# Patient Record
Sex: Female | Born: 1950 | Race: Black or African American | Hispanic: No | Smoking: Current some day smoker
Health system: Southern US, Community
[De-identification: ages and names within clinical notes are randomized; demographics above are authoritative.]

## PROBLEM LIST (undated history)

## (undated) DIAGNOSIS — R569 Unspecified convulsions: Secondary | ICD-10-CM

## (undated) DIAGNOSIS — I10 Essential (primary) hypertension: Secondary | ICD-10-CM

## (undated) DIAGNOSIS — B1920 Unspecified viral hepatitis C without hepatic coma: Secondary | ICD-10-CM

## (undated) DIAGNOSIS — E7800 Pure hypercholesterolemia, unspecified: Secondary | ICD-10-CM

## (undated) DIAGNOSIS — N390 Urinary tract infection, site not specified: Secondary | ICD-10-CM

## (undated) DIAGNOSIS — F32A Depression, unspecified: Secondary | ICD-10-CM

## (undated) DIAGNOSIS — F319 Bipolar disorder, unspecified: Secondary | ICD-10-CM

## (undated) HISTORY — DX: Pure hypercholesterolemia, unspecified: E78.00

## (undated) HISTORY — PX: TOE AMPUTATION: SHX809

## (undated) HISTORY — DX: Urinary tract infection, site not specified: N39.0

## (undated) HISTORY — PX: APPENDECTOMY: SHX54

## (undated) HISTORY — DX: Depression, unspecified: F32.A

---

## 2019-12-13 ENCOUNTER — Other Ambulatory Visit: Payer: Self-pay

## 2019-12-13 ENCOUNTER — Emergency Department (HOSPITAL_COMMUNITY)
Admission: EM | Admit: 2019-12-13 | Discharge: 2019-12-13 | Disposition: A | Discharge status: Home / Self Care | Payer: Medicare (Managed Care) | Attending: Emergency Medicine | Admitting: Emergency Medicine

## 2019-12-13 ENCOUNTER — Encounter (HOSPITAL_COMMUNITY): Payer: Self-pay | Admitting: Emergency Medicine

## 2019-12-13 DIAGNOSIS — R4182 Altered mental status, unspecified: Secondary | ICD-10-CM | POA: Diagnosis present

## 2019-12-13 DIAGNOSIS — F319 Bipolar disorder, unspecified: Secondary | ICD-10-CM | POA: Insufficient documentation

## 2019-12-13 DIAGNOSIS — Z79899 Other long term (current) drug therapy: Secondary | ICD-10-CM | POA: Diagnosis not present

## 2019-12-13 HISTORY — DX: Bipolar disorder, unspecified: F31.9

## 2019-12-13 LAB — COMPREHENSIVE METABOLIC PANEL
ALT: 24 U/L (ref 0–44)
AST: 22 U/L (ref 15–41)
Albumin: 4.7 g/dL (ref 3.5–5.0)
Alkaline Phosphatase: 51 U/L (ref 38–126)
Anion gap: 13 (ref 5–15)
BUN: 17 mg/dL (ref 8–23)
CO2: 23 mmol/L (ref 22–32)
Calcium: 10.2 mg/dL (ref 8.9–10.3)
Chloride: 100 mmol/L (ref 98–111)
Creatinine, Ser: 1.31 mg/dL — ABNORMAL HIGH (ref 0.44–1.00)
GFR calc Af Amer: 48 mL/min — ABNORMAL LOW (ref >60)
GFR calc non Af Amer: 42 mL/min — ABNORMAL LOW (ref >60)
Glucose, Bld: 190 mg/dL — ABNORMAL HIGH (ref 70–99)
Potassium: 3.7 mmol/L (ref 3.5–5.1)
Sodium: 136 mmol/L (ref 135–145)
Total Bilirubin: 0.6 mg/dL (ref 0.3–1.2)
Total Protein: 8 g/dL (ref 6.5–8.1)

## 2019-12-13 LAB — CBC
HCT: 42.8 % (ref 36.0–46.0)
Hemoglobin: 14.1 g/dL (ref 12.0–15.0)
MCH: 29.7 pg (ref 26.0–34.0)
MCHC: 32.9 g/dL (ref 30.0–36.0)
MCV: 90.3 fL (ref 80.0–100.0)
Platelets: 302 10*3/uL (ref 150–400)
RBC: 4.74 MIL/uL (ref 3.87–5.11)
RDW: 14.7 % (ref 11.5–15.5)
WBC: 9.9 10*3/uL (ref 4.0–10.5)
nRBC: 0 % (ref 0.0–0.2)

## 2019-12-13 LAB — CBG MONITORING, ED: Glucose-Capillary: 134 mg/dL — ABNORMAL HIGH (ref 70–99)

## 2019-12-13 MED ORDER — SODIUM CHLORIDE 0.9% FLUSH: 3.0000 mL | Freq: Once | INTRAVENOUS | Status: DC

## 2019-12-13 NOTE — Discharge Planning (Signed)
RNCM left voice message at [phone] and relayed that pt is ready to be picked up from hospital ED.

## 2019-12-13 NOTE — Discharge Instructions (Addendum)
It is important to establish psychiatric care, to manage her bipolar disorder.  You can be seen at [facility] by schedule an appointment, or go there, to be assessed anytime.  Return here, if needed, for problems.

## 2019-12-13 NOTE — Progress Notes (Addendum)
1:15pm: CSW spoke with Rolene Arbour, RN who states she discharged the patient home via cab. RN states she spoke with the patient's daughter after [employer] attempted the welfare check who was stating the patient should not be discharged regardless of medical clearance.  12:50pm: CSW received return call from Engineer, maintenance ([employer]) of [employer] who states the wellness check was unsuccessful. Deputy reports there are three cars in the driveway with the windows down but no answer from inside the home.  CSW attempted to reach Linli Su at [phone] without success, a voicemail was left requesting a return call. CSW sent text message requesting a return call as well.  12:20pm: CSW contacted non-emergent [employer] to perform a wellness check at the patient's address in attempts to find the family member who called EMS for her this morning.  [employer] will return call to CSW once check is completed.  Madilyn Fireman, MSW, LCSW-A Transitions of Care  Clinical Social Worker  [facility] Emergency Departments  Medical ICU [phone]

## 2019-12-13 NOTE — ED Notes (Signed)
Phone number that 911 call came from per [facility] communications is  [phone].

## 2019-12-13 NOTE — ED Provider Notes (Signed)
[facility] EMERGENCY DEPARTMENT Provider Note   CSN: [id number] Arrival date & time: 12/13/19  0444     History Chief Complaint  Patient presents with  . Altered Mental Status    Shelby Jordan is a 69 y.o. female.  HPI Patient presents after an episode this morning when she would not talk to family members.  She states she is taking medicines prescribed by her doctor in [place].  She is conversant, and responsive to questions.  She denies any acute medical or psychiatric issues.  She understands that she is bipolar, and that she needs to take medicine for it.  She states she is new to [place], after moving here, 6 months ago.  She denies any acute illnesses.  She states that she wants to take one of the Covid vaccines, and is trying to decide between [employer] and [employer]. She states that she has previously been treated with lithium but now is on a different antipsychotic medication that she cannot recall. There are no other known modifying factors.    Past Medical History:  Diagnosis Date  . Bipolar 1 disorder ([place])     There are no problems to display for this patient.   History reviewed. No pertinent surgical history.   OB History   No obstetric history on file.     No family history on file.  Social History   Tobacco Use  . Smoking status: Never Smoker  . Smokeless tobacco: Never Used  Substance Use Topics  . Alcohol use: Never  . Drug use: Never    Home Medications Prior to Admission medications   Not on File    Allergies    Patient has no known allergies.  Review of Systems   Review of Systems  All other systems reviewed and are negative.   Physical Exam Updated Vital Signs BP (!) 187/89   Pulse 94   Temp 98.2 F (36.8 C) (Oral)   Resp 18   SpO2 99%   Physical Exam Vitals and nursing note reviewed.  Constitutional:      Appearance: She is well-developed.  HENT:     Head: Normocephalic and atraumatic.      Right Ear: External ear normal.     Left Ear: External ear normal.  Eyes:     Conjunctiva/sclera: Conjunctivae normal.     Pupils: Pupils are equal, round, and reactive to light.  Neck:     Trachea: Phonation normal.  Cardiovascular:     Rate and Rhythm: Normal rate.  Pulmonary:     Effort: Pulmonary effort is normal.  Abdominal:     General: There is no distension.  Musculoskeletal:        General: Normal range of motion.     Cervical back: Normal range of motion and neck supple.  Skin:    General: Skin is warm and dry.  Neurological:     Mental Status: She is alert and oriented to person, place, and time.     Cranial Nerves: No cranial nerve deficit.     Sensory: No sensory deficit.     Motor: No abnormal muscle tone.     Coordination: Coordination normal.  Psychiatric:        Attention and Perception: She is attentive. She does not perceive auditory or visual hallucinations.        Mood and Affect: Mood is not anxious, depressed or elated. Affect is not labile, angry or inappropriate.  Speech: She is communicative. Speech is not rapid and pressured, delayed or tangential.        Behavior: Behavior normal. Behavior is not agitated, withdrawn or hyperactive. Behavior is cooperative.        Thought Content: Thought content is not paranoid or delusional.        Cognition and Memory: Memory is not impaired.        Judgment: Judgment is not impulsive or inappropriate.     ED Results / Procedures / Treatments   Labs (all labs ordered are listed, but only abnormal results are displayed) Labs Reviewed  COMPREHENSIVE METABOLIC PANEL - Abnormal; Notable for the following components:      Result Value   Glucose, Bld 190 (*)    Creatinine, Ser 1.31 (*)    GFR calc non Af Amer 42 (*)    GFR calc Af Amer 48 (*)    All other components within normal limits  CBG MONITORING, ED - Abnormal; Notable for the following components:   Glucose-Capillary 134 (*)    All other components  within normal limits  CBC    EKG EKG Interpretation  Date/Time:  Wednesday Dec 13 2019 04:53:52 EDT Ventricular Rate:  100 PR Interval:  170 QRS Duration: 148 QT Interval:  380 QTC Calculation: 490 R Axis:   -15 Text Interpretation: Sinus rhythm with Premature atrial complexes Right bundle branch block Abnormal ECG No old tracing to compare Confirmed by Delora Fuel ([id number]) on 12/13/2019 5:05:17 AM   Radiology No results found.  Procedures Procedures (including critical care time)  Medications Ordered in ED Medications  sodium chloride flush (NS) 0.9 % injection 3 mL (has no administration in time range)    ED Course  I have reviewed the triage vital signs and the nursing notes.  Pertinent labs & imaging results that were available during my care of the patient were reviewed by me and considered in my medical decision making (see chart for details).    MDM Rules/Calculators/A&P                       Patient Vitals for the past 24 hrs:  BP Temp Temp src Pulse Resp SpO2  12/13/19 0811 (!) 187/89 98.2 F (36.8 C) Oral 94 18 99 %    Nursing Notes Reviewed/ Care Coordinated Applicable Imaging Reviewed Interpretation of Laboratory Data incorporated into ED treatment    Medical Decision Making:  This patient is presenting for evaluation of a period of altered responsiveness while at home., which does require a range of treatment options, and is a complaint that involves a low risk of morbidity and mortality. The differential diagnoses include unstable psychiatric condition, depression, acute medical illness. I decided to review old records, and in summary elderly patient with history of bipolar disorder.  Patient recently moved to [facility] and does not have established primary care or psychiatric care at this time.      Critical Interventions-clinical evaluation, observation, reassessment  After These Interventions, the Patient was reevaluated and was found to  have clinical evidence for bipolar disorder, which appears to be stable at this time. She is cooperative and fairly insightful. She apparently does not have established care locally.  CRITICAL CARE-no Performed by: Daleen Bo  Nursing Notes Reviewed/ Care Coordinated Applicable Imaging Reviewed Interpretation of Laboratory Data incorporated into ED treatment  The patient appears reasonably screened and/or stabilized for discharge and I doubt any other medical condition or other [facility] requiring further screening,  evaluation, or treatment in the ED at this time prior to discharge.  Plan: Home Medications-usual; Home Treatments-rest, fluids; return here if the recommended treatment, does not improve the symptoms; Recommended follow up-follow-up with PCP and psychiatry as needed for problems.     Final Clinical Impression(s) / ED Diagnoses Final diagnoses:  Bipolar affective disorder, remission status unspecified ([place])    Rx / DC Orders ED Discharge Orders    None       Daleen Bo, MD 12/15/19 [phone]

## 2019-12-13 NOTE — ED Notes (Signed)
Lunch Tray Ordered @ 1130.

## 2019-12-13 NOTE — ED Notes (Signed)
Pt is talking to self, cooperative, knows daughtyer's and granddaughter's name, but not phone numbers.

## 2019-12-13 NOTE — ED Triage Notes (Signed)
Pt brought to Ed by [facility] from family home, per EMS pt just move here from [place], Hx of Bipolar. Per family she was just sitting on the dark and when they called her name she started screaming and fell on the floor. On EMS arrival pt was AO x 4. Per family pt use to have some seizure activity when she get Manic. BP 182/100, HR 110, CBG 247, SPO2 100% RA. Family unable to state LSW for this pt. Pt is AO x 4 on triage, no neuro deficit noticed.

## 2019-12-13 NOTE — ED Notes (Signed)
[place] notified in attempt to find family phone number. Pt does not know number to contact daughter for discharge, is visiting here from [place].

## 2019-12-14 ENCOUNTER — Other Ambulatory Visit: Payer: Self-pay

## 2019-12-14 ENCOUNTER — Inpatient Hospital Stay
Admission: AD | Admit: 2019-12-14 | Discharge: 2019-12-27 | DRG: 885 | Disposition: A | Discharge status: Home / Self Care | Payer: Medicare (Managed Care) | Source: Intra-hospital | Attending: Psychiatry | Admitting: Psychiatry

## 2019-12-14 ENCOUNTER — Emergency Department: Payer: Medicare (Managed Care)

## 2019-12-14 ENCOUNTER — Encounter: Payer: Self-pay | Admitting: Emergency Medicine

## 2019-12-14 ENCOUNTER — Emergency Department
Admission: EM | Admit: 2019-12-14 | Discharge: 2019-12-14 | Disposition: A | Discharge status: Other Acute Inpatient Hospital | Payer: Medicare (Managed Care) | Attending: Emergency Medicine | Admitting: Emergency Medicine

## 2019-12-14 DIAGNOSIS — F411 Generalized anxiety disorder: Secondary | ICD-10-CM | POA: Diagnosis not present

## 2019-12-14 DIAGNOSIS — F3164 Bipolar disorder, current episode mixed, severe, with psychotic features: Secondary | ICD-10-CM | POA: Diagnosis not present

## 2019-12-14 DIAGNOSIS — F3110 Bipolar disorder, current episode manic without psychotic features, unspecified: Secondary | ICD-10-CM

## 2019-12-14 DIAGNOSIS — R05 Cough: Secondary | ICD-10-CM | POA: Diagnosis not present

## 2019-12-14 DIAGNOSIS — F309 Manic episode, unspecified: Secondary | ICD-10-CM

## 2019-12-14 DIAGNOSIS — N390 Urinary tract infection, site not specified: Secondary | ICD-10-CM | POA: Diagnosis present

## 2019-12-14 DIAGNOSIS — B1920 Unspecified viral hepatitis C without hepatic coma: Secondary | ICD-10-CM | POA: Diagnosis present

## 2019-12-14 DIAGNOSIS — I10 Essential (primary) hypertension: Secondary | ICD-10-CM | POA: Diagnosis present

## 2019-12-14 DIAGNOSIS — E119 Type 2 diabetes mellitus without complications: Secondary | ICD-10-CM | POA: Diagnosis present

## 2019-12-14 DIAGNOSIS — F909 Attention-deficit hyperactivity disorder, unspecified type: Secondary | ICD-10-CM | POA: Diagnosis present

## 2019-12-14 DIAGNOSIS — Z20822 Contact with and (suspected) exposure to covid-19: Secondary | ICD-10-CM | POA: Insufficient documentation

## 2019-12-14 DIAGNOSIS — G4700 Insomnia, unspecified: Secondary | ICD-10-CM | POA: Diagnosis present

## 2019-12-14 DIAGNOSIS — F319 Bipolar disorder, unspecified: Secondary | ICD-10-CM

## 2019-12-14 DIAGNOSIS — R4182 Altered mental status, unspecified: Secondary | ICD-10-CM | POA: Insufficient documentation

## 2019-12-14 DIAGNOSIS — F3130 Bipolar disorder, current episode depressed, mild or moderate severity, unspecified: Secondary | ICD-10-CM | POA: Diagnosis present

## 2019-12-14 DIAGNOSIS — I679 Cerebrovascular disease, unspecified: Secondary | ICD-10-CM | POA: Diagnosis present

## 2019-12-14 HISTORY — DX: Essential (primary) hypertension: I10

## 2019-12-14 HISTORY — DX: Unspecified viral hepatitis C without hepatic coma: B19.20

## 2019-12-14 LAB — URINALYSIS, COMPLETE (UACMP) WITH MICROSCOPIC
Bilirubin Urine: NEGATIVE
Glucose, UA: NEGATIVE mg/dL
Hgb urine dipstick: NEGATIVE
Ketones, ur: NEGATIVE mg/dL
Nitrite: NEGATIVE
Protein, ur: 100 mg/dL — AB
Specific Gravity, Urine: 1.023 (ref 1.005–1.030)
Squamous Epithelial / HPF: >50 — ABNORMAL HIGH (ref 0–5)
WBC, UA: >50 WBC/hpf — ABNORMAL HIGH (ref 0–5)
pH: 5 (ref 5.0–8.0)

## 2019-12-14 LAB — URINE DRUG SCREEN, QUALITATIVE (ARMC ONLY)
Amphetamines, Ur Screen: NOT DETECTED
Barbiturates, Ur Screen: NOT DETECTED
Benzodiazepine, Ur Scrn: NOT DETECTED
Cannabinoid 50 Ng, Ur ~~LOC~~: NOT DETECTED
Cocaine Metabolite,Ur ~~LOC~~: NOT DETECTED
MDMA (Ecstasy)Ur Screen: NOT DETECTED
Methadone Scn, Ur: NOT DETECTED
Opiate, Ur Screen: NOT DETECTED
Phencyclidine (PCP) Ur S: NOT DETECTED
Tricyclic, Ur Screen: NOT DETECTED

## 2019-12-14 LAB — CBC
HCT: 39.4 % (ref 36.0–46.0)
Hemoglobin: 13.5 g/dL (ref 12.0–15.0)
MCH: 29.6 pg (ref 26.0–34.0)
MCHC: 34.3 g/dL (ref 30.0–36.0)
MCV: 86.4 fL (ref 80.0–100.0)
Platelets: 278 10*3/uL (ref 150–400)
RBC: 4.56 MIL/uL (ref 3.87–5.11)
RDW: 15.1 % (ref 11.5–15.5)
WBC: 8.3 10*3/uL (ref 4.0–10.5)
nRBC: 0 % (ref 0.0–0.2)

## 2019-12-14 LAB — SALICYLATE LEVEL: Salicylate Lvl: <7 mg/dL — ABNORMAL LOW (ref 7.0–30.0)

## 2019-12-14 LAB — COMPREHENSIVE METABOLIC PANEL
ALT: 21 U/L (ref 0–44)
AST: 20 U/L (ref 15–41)
Albumin: 4.7 g/dL (ref 3.5–5.0)
Alkaline Phosphatase: 51 U/L (ref 38–126)
Anion gap: 9 (ref 5–15)
BUN: 16 mg/dL (ref 8–23)
CO2: 21 mmol/L — ABNORMAL LOW (ref 22–32)
Calcium: 9.3 mg/dL (ref 8.9–10.3)
Chloride: 107 mmol/L (ref 98–111)
Creatinine, Ser: 1.14 mg/dL — ABNORMAL HIGH (ref 0.44–1.00)
GFR calc Af Amer: 57 mL/min — ABNORMAL LOW (ref >60)
GFR calc non Af Amer: 49 mL/min — ABNORMAL LOW (ref >60)
Glucose, Bld: 142 mg/dL — ABNORMAL HIGH (ref 70–99)
Potassium: 3.5 mmol/L (ref 3.5–5.1)
Sodium: 137 mmol/L (ref 135–145)
Total Bilirubin: 1 mg/dL (ref 0.3–1.2)
Total Protein: 8 g/dL (ref 6.5–8.1)

## 2019-12-14 LAB — ETHANOL: Alcohol, Ethyl (B): <10 mg/dL (ref <10)

## 2019-12-14 LAB — SARS CORONAVIRUS 2 BY RT PCR (HOSPITAL ORDER, PERFORMED IN ~~LOC~~ HOSPITAL LAB): SARS Coronavirus 2: NEGATIVE

## 2019-12-14 LAB — ACETAMINOPHEN LEVEL: Acetaminophen (Tylenol), Serum: <10 ug/mL — ABNORMAL LOW (ref 10–30)

## 2019-12-14 MED ORDER — LORAZEPAM 2 MG/ML IJ SOLN: 2.0000 mg | Freq: Four times a day (QID) | INTRAMUSCULAR | Status: DC | PRN

## 2019-12-14 MED ORDER — DIPHENHYDRAMINE HCL 25 MG PO CAPS: 50.0000 mg | ORAL_CAPSULE | Freq: Once | ORAL | Status: DC

## 2019-12-14 MED ORDER — DULOXETINE HCL 20 MG PO CPEP
20.0000 mg | ORAL_CAPSULE | Freq: Every day | ORAL | Status: DC
  Administered 2019-12-14: 20 mg via ORAL
  Filled 2019-12-14 (×2): qty 1

## 2019-12-14 MED ORDER — IBUPROFEN 600 MG PO TABS
600.0000 mg | ORAL_TABLET | Freq: Four times a day (QID) | ORAL | Status: DC | PRN
  Administered 2019-12-14: 600 mg via ORAL
  Filled 2019-12-14: qty 1

## 2019-12-14 MED ORDER — DIVALPROEX SODIUM ER 500 MG PO TB24
500.0000 mg | ORAL_TABLET | Freq: Every day | ORAL | Status: DC
  Administered 2019-12-14: 500 mg via ORAL
  Filled 2019-12-14: qty 1

## 2019-12-14 MED ORDER — ARIPIPRAZOLE 5 MG PO TABS
2.5000 mg | ORAL_TABLET | Freq: Every day | ORAL | Status: DC
  Administered 2019-12-14: 2.5 mg via ORAL
  Filled 2019-12-14 (×2): qty 1

## 2019-12-14 MED ORDER — LORAZEPAM 2 MG PO TABS: 2.0000 mg | ORAL_TABLET | Freq: Once | ORAL | Status: DC

## 2019-12-14 MED ORDER — DIPHENHYDRAMINE HCL 50 MG/ML IJ SOLN: 50.0000 mg | Freq: Four times a day (QID) | INTRAMUSCULAR | Status: DC | PRN

## 2019-12-14 MED ORDER — HALOPERIDOL 5 MG PO TABS
5.0000 mg | ORAL_TABLET | Freq: Once | ORAL | Status: AC
  Administered 2019-12-14: 5 mg via ORAL
  Filled 2019-12-14: qty 1

## 2019-12-14 MED ORDER — HALOPERIDOL LACTATE 5 MG/ML IJ SOLN: 5.0000 mg | Freq: Four times a day (QID) | INTRAMUSCULAR | Status: DC | PRN

## 2019-12-14 NOTE — ED Notes (Signed)
Pt denies SI/HI/AVH but needs lots of redirection to focus. Flight of ideas and hyperverbal speech. Cooperative with staff directions. Advised urine specimen needed and she verbalized understanding stating "I'm not ready to go yet". Advised to let staff know before she uses restroom.

## 2019-12-14 NOTE — ED Notes (Signed)
Pt given [place] sandwich tray and ice water. No other needs voiced at this time; will continue to monitor Q15 minute rounds.

## 2019-12-14 NOTE — ED Notes (Signed)
IVC with note from Dr Janese Banks for admit to inpt at [facility] or any other psych unit, pending admit

## 2019-12-14 NOTE — ED Notes (Signed)
Patient consulted by psych, off unit to ct headach.

## 2019-12-14 NOTE — ED Notes (Signed)
Assumed care of patient patient calm and cooperative. Denies HV/HI/SI. States here in ed for refill of her medications. Patient with non violent strange thought process. Unable to get clear thoughts and needs across. Patient calm and cooperative. Awaiting md eval.

## 2019-12-14 NOTE — ED Provider Notes (Signed)
[facility] Emergency Department Provider Note  ____________________________________________   First MD Initiated Contact with Patient 12/14/19 1359     (approximate)  I have reviewed the triage vital signs and the nursing notes.   HISTORY  Chief Complaint Insomnia and Manic Behavior    HPI Taris Kosman is a 69 y.o. female with bipolar, hepatitis C, hypertension who comes in for medication refill.  According to patient she states that she is been off of her medicine for a few days because she ran out of them.  States that she recently came from [place] to stay with her family.  She denies any SI, HI although she is definitely tangential in thought and manic in nature.  When I call family, her daughter [phone] they were very concerned about patient.  They said that she is actually been living there for over a year and is not been taking her medicine.  They state that she has been going into other people's houses and that she is getting more erratic and having aggressive tendencies toward the family.  They are worried about the safety for herself and others.  Supples there was an episode yesterday where she was found down on the ground.  Unsure if she had had a seizure.  Per family they stated that she might of had seizures in the past for the past 13 to 14 years.  Patient was seen yesterday at [facility] it appears.  States that they could not get a hold of the family due to not knowing the phone numbers and so patient was discharged          Past Medical History:  Diagnosis Date  . Bipolar 1 disorder ([place])   . Hepatitis C   . Hypertension     There are no problems to display for this patient.   Past Surgical History:  Procedure Laterality Date  . APPENDECTOMY    . TOE AMPUTATION      Prior to Admission medications   Not on File    Allergies Patient has no known allergies.  No family history on file.  Social History Social History    Tobacco Use  . Smoking status: Never Smoker  . Smokeless tobacco: Never Used  Substance Use Topics  . Alcohol use: Never  . Drug use: Never      Review of Systems Constitutional: No fever/chills Eyes: No visual changes. ENT: No sore throat. Cardiovascular: Denies chest pain. Respiratory: Denies shortness of breath. Gastrointestinal: No abdominal pain.  No nausea, no vomiting.  No diarrhea.  No constipation. Genitourinary: Negative for dysuria. Musculoskeletal: Negative for back pain. Skin: Negative for rash. Neurological: Negative for headaches, focal weakness or numbness. All other ROS negative ____________________________________________   PHYSICAL EXAM:  VITAL SIGNS: ED Triage Vitals  Enc Vitals Group     BP 12/14/19 1131 (!) 159/90     Pulse Rate 12/14/19 1131 91     Resp 12/14/19 1131 16     Temp 12/14/19 1131 98.9 F (37.2 C)     Temp Source 12/14/19 1131 Oral     SpO2 12/14/19 1131 97 %     Weight 12/14/19 1133 200 lb (90.7 kg)     Height 12/14/19 1133 5' 4.5" (1.638 m)     Head Circumference --      Peak Flow --      Pain Score 12/14/19 1132 0     Pain Loc --      Pain Edu? --  Excl. in [place]? --     Constitutional: Alert and oriented. Well appearing and in no acute distress. Eyes: Conjunctivae are normal. EOMI. Head: Atraumatic. Nose: No congestion/rhinnorhea. Mouth/Throat: Mucous membranes are moist.   Neck: No stridor. Trachea Midline. FROM Cardiovascular: Normal rate, regular rhythm. Grossly normal heart sounds.  Good peripheral circulation. Respiratory: Normal respiratory effort.  No retractions. Lungs CTAB. Gastrointestinal: Soft and nontender. No distention. No abdominal bruits.  Musculoskeletal: No lower extremity tenderness nor edema.  No joint effusions. Neurologic:  Normal speech and language. No gross focal neurologic deficits are appreciated.  Skin:  Skin is warm, dry and intact. No rash noted. Psychiatric: Patient denies SI, HI.   However patient is manic, tangential. GU: Deferred   ____________________________________________   LABS (all labs ordered are listed, but only abnormal results are displayed)  Labs Reviewed  COMPREHENSIVE METABOLIC PANEL - Abnormal; Notable for the following components:      Result Value   CO2 21 (*)    Glucose, Bld 142 (*)    Creatinine, Ser 1.14 (*)    GFR calc non Af Amer 49 (*)    GFR calc Af Amer 57 (*)    All other components within normal limits  SALICYLATE LEVEL - Abnormal; Notable for the following components:   Salicylate Lvl [id number] (*)    All other components within normal limits  ACETAMINOPHEN LEVEL - Abnormal; Notable for the following components:   Acetaminophen (Tylenol), Serum <10 (*)    All other components within normal limits  ETHANOL  CBC  URINE DRUG SCREEN, QUALITATIVE ([facility] ONLY)   ____________________________________________    RADIOLOGY Robert Bellow, personally viewed and evaluated these images (plain radiographs) as part of my medical decision making, as well as reviewing the written report by the radiologist.  ED MD interpretation:    Official radiology report(s): CT Head Wo Contrast  Result Date: 12/14/2019 CLINICAL DATA:  Manic behavior, insomnia, bipolar disorder, hypertension EXAM: CT HEAD WITHOUT CONTRAST TECHNIQUE: Contiguous axial images were obtained from the base of the skull through the vertex without intravenous contrast. COMPARISON:  None. FINDINGS: Brain: Chronic lacunar infarct left basal ganglia. Hypodensities within the bilateral periventricular white matter and basal ganglia consistent with age-indeterminate small vessel with ischemic changes. No other signs of acute infarct or hemorrhage. Lateral ventricles and remaining midline structures are unremarkable. There are no acute extra-axial fluid collections. There is no mass effect. Vascular: No hyperdense vessel or unexpected calcification. Skull: Normal. Negative for fracture or  focal lesion. Sinuses/Orbits: No acute finding. Other: None. IMPRESSION: 1. Age-indeterminate small vessel ischemic changes within the periventricular white matter and basal ganglia, favor chronic. 2. Chronic left basal ganglia lacunar infarct. 3. No acute intracranial process. Electronically Signed   By: Randa Ngo M.D.   On: 12/14/2019 15:27    ____________________________________________   PROCEDURES  Procedure(s) performed (including Critical Care):  Procedures   ____________________________________________   INITIAL IMPRESSION / ASSESSMENT AND PLAN / ED COURSE  Maila Zeman was evaluated in Emergency Department on 12/14/2019 for the symptoms described in the history of present illness. She was evaluated in the context of the global COVID-19 pandemic, which necessitated consideration that the patient might be at risk for infection with the SARS-CoV-2 virus that causes COVID-19. Institutional protocols and algorithms that pertain to the evaluation of patients at risk for COVID-19 are in a state of rapid change based on information released by regulatory bodies including the CDC and federal and state organizations. These policies and algorithms were  followed during the patient's care in the ED.    Pt is without any acute medical complaints.  Although given patient was found down on the ground yesterday will get CT head to make sure evidence of intracranial hemorrhage as it does not appear to been done yesterday.  CT chronic changes.   Given family's concern about her being a danger to herself and others will IVC patient for further psychiatric evaluation   No exam findings to suggest medical cause of current presentation. Will order psychiatric screening labs and discuss further w/ psychiatric service.  D/d includes but is not limited to psychiatric disease, behavioral/personality disorder, inadequate socioeconomic support, medical.  Based on HPI, exam, unremarkable labs, no  concern for acute medical problem at this time. No rigidity, clonus, hyperthermia, focal neurologic deficit, diaphoresis, tachycardia, meningismus, ataxia, gait abnormality or other finding to suggest this visit represents a non-psychiatric problem. Screening labs reviewed.    Given this, pt medically cleared, to be dispositioned per Psych.  The patient has been placed in psychiatric observation due to the need to provide a safe environment for the patient while obtaining psychiatric consultation and evaluation, as well as ongoing medical and medication management to treat the patient's condition.  The patient has been placed under full IVC at this time.  ____________________________________________   FINAL CLINICAL IMPRESSION(S) / ED DIAGNOSES   Final diagnoses:  Mania ([place])  Bipolar 1 disorder ([place])     TO NOTE this encounter took place during epic down time.  MEDICATIONS GIVEN DURING THIS VISIT:  Medications  DULoxetine (CYMBALTA) DR capsule 20 mg (20 mg Oral Given 12/14/19 1613)  divalproex (DEPAKOTE ER) 24 hr tablet 500 mg (has no administration in time range)  ARIPiprazole (ABILIFY) tablet 2.5 mg (has no administration in time range)  haloperidol lactate (HALDOL) injection 5 mg (has no administration in time range)  LORazepam (ATIVAN) injection 2 mg (has no administration in time range)  diphenhydrAMINE (BENADRYL) injection 50 mg (has no administration in time range)     ED Discharge Orders    None       Note:  This document was prepared using Dragon voice recognition software and may include unintentional dictation errors.   Vanessa Speculator, MD 12/14/19 [phone]

## 2019-12-14 NOTE — ED Notes (Signed)
Pt given milk 

## 2019-12-14 NOTE — ED Triage Notes (Addendum)
Patient presents to the ED with her granddaughter for manic behavior and insomnia for the last few days.  Patient's granddaughter states that patient is bipolar and is in her manic state and patient has been visiting the past 10 days and has not been taking her medication.  Granddaughter states she got home from work at 3 am and grandmother was had an episode where she wasn't making any sense and then patient's eyes rolled back in her head, she was screaming and foaming at the mouth.  Patient went to [place] and per granddaughter her blood pressure at the time was 300/160.  Patient is very talkative and subject matter is broad.  Patient discussing God more than normal.  Patient denies any auditory and visual hallucinations.  Granddaughter brought in an empty bottle of Depakote.

## 2019-12-14 NOTE — ED Notes (Signed)
Patient transferred  To [place].

## 2019-12-14 NOTE — BH Assessment (Signed)
Patient is to be admitted to [facility] by Dr. Janese Banks.  Attending Physician will be Dr. Weber Cooks.   Patient has been assigned to room 315, by [place].   ER staff is aware of the admission:  [place], ER Secretary    Dr. Quentin Cornwall, ER MD   Tomasa Hosteller, [place], Patient Access.

## 2019-12-14 NOTE — Consult Note (Addendum)
[facility] Consult   Reason for Consult:   ON IVC  OOC behaviors, worsening mania, clinically can deteriorate if discharged  Off meds for one year   Referring Physician:    ED MD  Patient Identification: Shelby Jordan MRN:  [id number] Principal Diagnosis: <principal problem not specified> Diagnosis:    Bipolar Mixed with Psychosis (mostly manic phase ---Generalized anxiety     Total Time spent with patient:  Greater than one hour    Subjective:   Shelby Jordan is a 69 y.o. female patient admitted with  Bipolar disorder mixed (mainly in frank manic phase) daughter concerned about worsening OOC behaviors, wandering away going to different cities.  Thoughts are racing pressured, speeded and she is having decreased sleep.  Unreliable off meds for at least one year.  On IVC pending transfer to Psych bed.     HPI:  As above.  Worsening mood swings, ups and downs, lability, loud pressured speech, racing thoughts and actions.  She at times does not make sense.  Unclear how she is caring --for self   She has travelled to several cities.  She is delusional and has false beliefs that do not make sense ---She denies not taking medicines.  She feels she well and can go home.   She has had decreased sleep as well.    Daughter worried about clinical deterioration if discharged and hence is on IVC   She also has generalized anxiety with excessive worry, nervousness tension, frustration, somatic issues, autonomic symptoms      Past Psychiatric History:   Unreliable historian  She says she has had no recent psych admissions and has not filled her meds recently.  Daughter says she has not been on for a year  No substance drug or ETOH issues  No other Court or legal issues   Education --part HS    Mental status  Strange odd AA female oriented to person place date and time   Rapport --strange ---over engaging, intrusive ---  Speech --loud, pressured speeded    Concentration and attention variable  Consciousness not fluctuant or clouded   Has slight possible early TD movement in her mouth and jaw   Thought content --strange odd themes, delusional   Thought process --FOI LOA ---illogical disorganized at times not making sense  Circumstantial, tangential   Unclear if she hears voices or has visual hallucinations --as she is vague   Memory remote recent and immediate ---intact through general questions   Fund of knowledge/ intelligence below average   Judgement insight and reliability ---all poor   Abstraction --somewhat concrete   Mood --elevated euphoric but also edgy and irritable  Labile   Affect ---strange and odd         Risk to Self:  risk of clinical deterioration if not admitted  Too ill for discharge   Risk to Others:  has been escalating and is getting loud and pressured  Prior Inpatient Therapy:  vague but not recently she says she was admitted in [facility]  Prior Outpatient Therapy:  nothing recently   Past Medical History:  Past Medical History:  Diagnosis Date  . Bipolar 1 disorder ([place])   . Hepatitis C   . Hypertension     Past Surgical History:  Procedure Laterality Date  . APPENDECTOMY    . TOE AMPUTATION     Family History: No family history on file.  Family Psychiatric  History:  Parents with history of bipolar disorder depression and anxiety  Social History:   Unclear home, but currently with daughter ---at this time   On disability SSI  Social History   Substance and Sexual Activity  Alcohol Use Never     Social History   Substance and Sexual Activity  Drug Use Never    Social History   Socioeconomic History  . Marital status: Single    Spouse name: Not on file  . Number of children: Not on file  . Years of education: Not on file  . Highest education level: Not on file  Occupational History  . Not on file  Tobacco Use  . Smoking status: Never Smoker  . Smokeless  tobacco: Never Used  Substance and Sexual Activity  . Alcohol use: Never  . Drug use: Never  . Sexual activity: Not on file  Other Topics Concern  . Not on file  Social History Narrative  . Not on file   Social Determinants of Health   Financial Resource Strain:   . Difficulty of Paying Living Expenses:   Food Insecurity:   . Worried About Charity fundraiser in the Last Year:   . Arboriculturist in the Last Year:   Transportation Needs:   . Film/video editor (Medical):   Marland Kitchen Lack of Transportation (Non-Medical):   Physical Activity:   . Days of Exercise per Week:   . Minutes of Exercise per Session:   Stress:   . Feeling of Stress :   Social Connections:   . Frequency of Communication with Friends and Family:   . Frequency of Social Gatherings with Friends and Family:   . Attends Religious Services:   . Active Member of Clubs or Organizations:   . Attends Archivist Meetings:   Marland Kitchen Marital Status:    Additional Social History:    Allergies:  No Known Allergies  Labs:  Results for orders placed or performed during the hospital encounter of 12/14/19 (from the past 48 hour(s))  Comprehensive metabolic panel     Status: Abnormal   Collection Time: 12/14/19 11:45 AM  Result Value Ref Range   Sodium 137 135 - 145 mmol/L   Potassium 3.5 3.5 - 5.1 mmol/L   Chloride 107 98 - 111 mmol/L   CO2 21 (L) 22 - 32 mmol/L   Glucose, Bld 142 (H) 70 - 99 mg/dL    Comment: Glucose reference range applies only to samples taken after fasting for at least 8 hours.   BUN 16 8 - 23 mg/dL   Creatinine, Ser 1.14 (H) 0.44 - 1.00 mg/dL   Calcium 9.3 8.9 - 10.3 mg/dL   Total Protein 8.0 6.5 - 8.1 g/dL   Albumin 4.7 3.5 - 5.0 g/dL   AST 20 15 - 41 U/L   ALT 21 0 - 44 U/L   Alkaline Phosphatase 51 38 - 126 U/L   Total Bilirubin 1.0 0.3 - 1.2 mg/dL   GFR calc non Af Amer 49 (L) >60 mL/min   GFR calc Af Amer 57 (L) >60 mL/min   Anion gap 9 5 - 15    Comment: Performed at  [facility], [address] [place], [place] [address]  Ethanol     Status: None   Collection Time: 12/14/19 11:45 AM  Result Value Ref Range   Alcohol, Ethyl (B) <10 <10 mg/dL    Comment: (NOTE) Lowest detectable limit for serum alcohol is 10 mg/dL. For medical purposes only. Performed at [facility], [address] [place], [place] [id number]   Salicylate level     Status: Abnormal   Collection Time: 12/14/19 11:45 AM  Result Value Ref Range   Salicylate Lvl Q000111Q (L) 7.0 - 30.0 mg/dL    Comment: Performed at [facility], [place], [place] [address]  Acetaminophen level     Status: Abnormal   Collection Time: 12/14/19 11:45 AM  Result Value Ref Range   Acetaminophen (Tylenol), Serum <10 (L) 10 - 30 ug/mL    Comment: (NOTE) Therapeutic concentrations vary significantly. A range of 10-30 ug/mL  may be an effective concentration for many patients. However, some  are best treated at concentrations outside of this range. Acetaminophen concentrations >150 ug/mL at 4 hours after ingestion  and >50 ug/mL at 12 hours after ingestion are often associated with  toxic reactions. Performed at [facility], [address] [place], [place] [address]   cbc     Status: None   Collection Time: 12/14/19 11:45 AM  Result Value Ref Range   WBC 8.3 4.0 - 10.5 K/uL   RBC 4.56 3.87 - 5.11 MIL/uL   Hemoglobin 13.5 12.0 - 15.0 g/dL   HCT 39.4 36.0 - 46.0 %   MCV 86.4 80.0 - 100.0 fL   MCH 29.6 26.0 - 34.0 pg   MCHC 34.3 30.0 - 36.0 g/dL   RDW 15.1 11.5 - 15.5 %   Platelets 278 150 - 400 K/uL   nRBC 0.0 0.0 - 0.2 %    Comment: Performed at [facility], [address] [place], [place] [address]    Current Facility-Administered Medications  Medication Dose Route Frequency Provider Last Rate Last Admin  . ARIPiprazole (ABILIFY) tablet 2.5 mg  2.5 mg Oral QHS Eulas Post, MD      . divalproex (DEPAKOTE ER) 24 hr tablet  500 mg  500 mg Oral QHS Eulas Post, MD      . DULoxetine (CYMBALTA) DR capsule 20 mg  20 mg Oral Daily Eulas Post, MD       No current outpatient medications on file.    Musculoskeletal: Strength & Muscle Tone:  No new change   Gait & Station: speeded   Patient leans: N/A  Psychiatric Specialty Exam: Physical Exam  Review of Systems  Blood pressure (!) 159/90, pulse 91, temperature 98.9 F (37.2 C), temperature source Oral, resp. rate 16, height 5' 4.5" (1.638 m), weight 90.7 kg, SpO2 97 %.Body mass index is 33.8 kg/m.                                                         Treatment Plan Summary:  AA female with bipolar --disorder ---mixed (mostly manic )----needing admission   meds written and she is agreeable now although on IVC   Disposition:  Admitted to [facility] or if not possible outside psych admission    Eulas Post, MD 12/14/2019 3:52 PM

## 2019-12-14 NOTE — ED Notes (Signed)
Pt given water 

## 2019-12-15 ENCOUNTER — Encounter: Payer: Self-pay | Admitting: Internal Medicine

## 2019-12-15 DIAGNOSIS — F3110 Bipolar disorder, current episode manic without psychotic features, unspecified: Secondary | ICD-10-CM

## 2019-12-15 DIAGNOSIS — I10 Essential (primary) hypertension: Secondary | ICD-10-CM

## 2019-12-15 DIAGNOSIS — F319 Bipolar disorder, unspecified: Secondary | ICD-10-CM | POA: Insufficient documentation

## 2019-12-15 LAB — TSH: TSH: 1.54 u[IU]/mL (ref 0.350–4.500)

## 2019-12-15 MED ORDER — TRAZODONE HCL 100 MG PO TABS
100.0000 mg | ORAL_TABLET | Freq: Every day | ORAL | Status: DC
  Administered 2019-12-15: 100 mg via ORAL

## 2019-12-15 MED ORDER — DIVALPROEX SODIUM 500 MG PO DR TAB
500.0000 mg | DELAYED_RELEASE_TABLET | Freq: Four times a day (QID) | ORAL | Status: DC
  Administered 2019-12-15 – 2019-12-19 (×17): 500 mg via ORAL
  Filled 2019-12-15 (×17): qty 1

## 2019-12-15 MED ORDER — AMOXICILLIN 500 MG PO CAPS
500.0000 mg | ORAL_CAPSULE | Freq: Two times a day (BID) | ORAL | Status: DC
  Administered 2019-12-15 – 2019-12-19 (×9): 500 mg via ORAL
  Filled 2019-12-15 (×11): qty 1

## 2019-12-15 MED ORDER — ACETAMINOPHEN 325 MG PO TABS
650.0000 mg | ORAL_TABLET | Freq: Four times a day (QID) | ORAL | Status: DC | PRN
  Administered 2019-12-19 – 2019-12-27 (×18): 650 mg via ORAL
  Filled 2019-12-15 (×20): qty 2

## 2019-12-15 MED ORDER — ALUM & MAG HYDROXIDE-SIMETH 200-200-20 MG/5ML PO SUSP: 30.0000 mL | ORAL | Status: DC | PRN

## 2019-12-15 MED ORDER — HYDROXYZINE HCL 10 MG PO TABS
10.0000 mg | ORAL_TABLET | Freq: Three times a day (TID) | ORAL | Status: DC | PRN
  Administered 2019-12-15 – 2019-12-27 (×4): 10 mg via ORAL
  Filled 2019-12-15 (×4): qty 1

## 2019-12-15 MED ORDER — TEMAZEPAM 15 MG PO CAPS
30.0000 mg | ORAL_CAPSULE | Freq: Every evening | ORAL | Status: DC | PRN
  Administered 2019-12-15 – 2019-12-26 (×6): 30 mg via ORAL
  Filled 2019-12-15 (×7): qty 2

## 2019-12-15 MED ORDER — MAGNESIUM HYDROXIDE 400 MG/5ML PO SUSP: 30.0000 mL | Freq: Every day | ORAL | Status: DC | PRN

## 2019-12-15 MED ORDER — TRAZODONE HCL 100 MG PO TABS
ORAL_TABLET | ORAL | Status: AC
  Filled 2019-12-15: qty 1

## 2019-12-15 MED ORDER — AMLODIPINE BESYLATE 5 MG PO TABS
5.0000 mg | ORAL_TABLET | Freq: Every day | ORAL | Status: DC
  Administered 2019-12-15 – 2019-12-16 (×2): 5 mg via ORAL
  Filled 2019-12-15 (×2): qty 1

## 2019-12-15 MED ORDER — DIPHENHYDRAMINE HCL 25 MG PO CAPS
25.0000 mg | ORAL_CAPSULE | Freq: Every evening | ORAL | Status: DC | PRN
  Administered 2019-12-15 – 2019-12-26 (×11): 25 mg via ORAL
  Filled 2019-12-15 (×11): qty 1

## 2019-12-15 MED ORDER — HYDROXYZINE HCL 10 MG PO TABS
ORAL_TABLET | ORAL | Status: AC
  Filled 2019-12-15: qty 1

## 2019-12-15 NOTE — Plan of Care (Signed)
D: Pt alert and oriented x 4. Pt rates depression 0/10, hopelessness 0/10, and anxiety 0/10.Pt goal: pt did not list a goal. Pt reports energy leave as pt did not list and concentration as being good. Pt reports sleep last night as being fair. Pt reports denies pain. Pt denies experiencing any SI/HI, or AVH at this time.   A: Scheduled medications administered to pt, per MD orders. Support and encouragement provided. Frequent verbal contact made. Routine safety checks conducted q15 minutes.   R: No adverse drug reactions noted. Pt verbally contracts for safety at this time. Pt complaint with medications and treatment plan. Pt interacts well with others on the unit. Pt remains safe at this time. Will continue to monitor.   Problem: Education: Goal: Knowledge of [facility] Education information/materials will improve Outcome: Not Progressing Goal: Emotional status will improve Outcome: Not Progressing Goal: Mental status will improve Outcome: Not Progressing Goal: Verbalization of understanding the information provided will improve Outcome: Not Progressing   Problem: Activity: Goal: Interest or engagement in activities will improve Outcome: Not Progressing Goal: Sleeping patterns will improve Outcome: Not Progressing   Problem: Coping: Goal: Ability to verbalize frustrations and anger appropriately will improve Outcome: Not Progressing Goal: Ability to demonstrate self-control will improve Outcome: Not Progressing   Problem: Health Behavior/Discharge Planning: Goal: Identification of resources available to assist in meeting health care needs will improve Outcome: Not Progressing Goal: Compliance with treatment plan for underlying cause of condition will improve Outcome: Not Progressing   Problem: Physical Regulation: Goal: Ability to maintain clinical measurements within normal limits will improve Outcome: Not Progressing   Problem: Safety: Goal: Periods of time without  injury will increase Outcome: Not Progressing   Problem: Activity: Goal: Will verbalize the importance of balancing activity with adequate rest periods Outcome: Not Progressing   Problem: Education: Goal: Will be free of psychotic symptoms Outcome: Not Progressing Goal: Knowledge of the prescribed therapeutic regimen will improve Outcome: Not Progressing   Problem: Coping: Goal: Coping ability will improve Outcome: Not Progressing Goal: Will verbalize feelings Outcome: Not Progressing   Problem: Health Behavior/Discharge Planning: Goal: Compliance with prescribed medication regimen will improve Outcome: Not Progressing   Problem: Nutritional: Goal: Ability to achieve adequate nutritional intake will improve Outcome: Not Progressing   Problem: Role Relationship: Goal: Ability to communicate needs accurately will improve Outcome: Not Progressing Goal: Ability to interact with others will improve Outcome: Not Progressing   Problem: Safety: Goal: Ability to redirect hostility and anger into socially appropriate behaviors will improve Outcome: Not Progressing Goal: Ability to remain free from injury will improve Outcome: Not Progressing   Problem: Self-Care: Goal: Ability to participate in self-care as condition permits will improve Outcome: Not Progressing   Problem: Self-Concept: Goal: Will verbalize positive feelings about self Outcome: Not Progressing

## 2019-12-15 NOTE — Progress Notes (Signed)
Recreation Therapy Notes  INPATIENT RECREATION THERAPY ASSESSMENT  Patient Details Name: Gindy Biles MRN: [id number] DOB: Oct 13, 1950 Today's Date: 12/15/2019       Information Obtained From: Patient  Able to Participate in Assessment/Interview: Yes  Patient Presentation: Responsive  Reason for Admission (Per Patient): Active Symptoms  Patient Stressors:    Coping Skills:   Music, Talk, Prayer  Leisure Interests (2+):  Music - Listen, Social - Family(Cook)  Frequency of Recreation/Participation:    Awareness of Community Resources:     [employer]:     Current Use:    If no, Barriers?:    Expressed Interest in [employer] Information:    [employer] of Residence:  Insurance underwriter  Patient Main Form of Transportation: Taxi  Patient Strengths:  Eat healthy  Patient Identified Areas of Improvement:  My health  Patient Goal for Hospitalization:  To buy myself a home when I get out of here.  Current SI (including self-harm):  No  Current HI:  No  Current AVH: No  Staff Intervention Plan: Group Attendance, Collaborate with Interdisciplinary Treatment Team  Consent to Intern Participation: N/A  Annica Marinello 12/15/2019, 3:21 PM

## 2019-12-15 NOTE — BHH Suicide Risk Assessment (Signed)
[facility] INPATIENT:  Family/Significant Other Suicide Prevention Education  Suicide Prevention Education:  Education Completed; Rolly Pancake, [phone] has been identified by the patient as the family member/significant other with whom the patient will be residing, and identified as the person(s) who will aid the patient in the event of a mental health crisis (suicidal ideations/suicide attempt).  With written consent from the patient, the family member/significant other has been provided the following suicide prevention education, prior to the and/or following the discharge of the patient.  The suicide prevention education provided includes the following:  Suicide risk factors  Suicide prevention and interventions  National Suicide Hotline telephone number  [facility] assessment telephone number  [facility] Emergency Assistance [place] and/or Residential Mobile Crisis Unit telephone number  Request made of family/significant other to:  Remove weapons (e.g., guns, rifles, knives), all items previously/currently identified as safety concern.    Remove drugs/medications (over-the-counter, prescriptions, illicit drugs), all items previously/currently identified as a safety concern.  The family member/significant other verbalizes understanding of the suicide prevention education information provided.  The family member/significant other agrees to remove the items of safety concern listed above.  Pt's granddaughter reports that the patient has "been in a manic episode" for several days.  Se reports that patient is a danger to self due to "roaming".  She reports that the patient has entered the home of neighbors.  She also reports that the patient has not been sleeping.  She reports that patient does not have an outpatient provider and the family would like for her to have one.  She reports that the patient has recently come to visit form [place].  She reports  that a similar episode occurred 2 months ago when patient was in [place].  Rozann Lesches 12/15/2019, 11:46 AM

## 2019-12-15 NOTE — BH Assessment (Signed)
Assessment Note  Shelby Jordan is an 69 y.o. female who presents to the ER via her granddaughter due to her behaviors and mental state. Per the family, patient has been manic and hyper religious. She's not sleeping.  Patient was recently in another, in system ER/facility and upon discharge her symptoms hadn't improved. Family continued to have concerns and brought her to [facility].  During the interview, the patient was calm, cooperative and pleasant. She was able to provide appropriate answers to majority of the questions. She was hyper-verbal and tangential but able to be redirected. She denies SI/HI and AV/H. She acknowledges she have a history of mental health concerns but unable to give accurate history of past treatment.  Diagnosis: Bipolar  Past Medical History:  Past Medical History:  Diagnosis Date  . Bipolar 1 disorder ([place])   . Hepatitis C   . Hypertension     Past Surgical History:  Procedure Laterality Date  . APPENDECTOMY    . TOE AMPUTATION      Family History: No family history on file.  Social History:  reports that she has never smoked. She has never used smokeless tobacco. She reports that she does not drink alcohol or use drugs.  Additional Social History:  Alcohol / Drug Use Pain Medications: See PTA Prescriptions: See PTA Over the Counter: See PTA History of alcohol / drug use?: No history of alcohol / drug abuse Longest period of sobriety (when/how long): n/a  CIWA: CIWA-Ar BP: (!) 135/111 Pulse Rate: 85 COWS:    Allergies: No Known Allergies  Home Medications: (Not in a hospital admission)   OB/GYN Status:  No LMP recorded.  General Assessment Data Location of Assessment: [facility] ED TTS Assessment: In system Is this a Tele or Face-to-Face Assessment?: Face-to-Face Is this an Initial Assessment or a Re-assessment for this encounter?: Initial Assessment Patient Accompanied by:: Other(Granddaughter) Language Other than English: No Living Arrangements:  Other (Comment)(Private Home) What gender do you identify as?: Female Marital status: Single Pregnancy Status: No Living Arrangements: Other relatives Can pt return to current living arrangement?: Yes Admission Status: Involuntary Petitioner: ED Attending Is patient capable of signing voluntary admission?: No(Under IVC) Referral Source: Self/Family/Friend Insurance type: Medicare  Medical Screening Exam [facility] Walk-in ONLY) Medical Exam completed: Yes  Crisis Care Plan Living Arrangements: Other relatives Legal Guardian: Other:(Self) Name of Psychiatrist: Reports of none Name of Therapist: Reports of none  Education Status Is patient currently in school?: No  Risk to self with the past 6 months Suicidal Ideation: No Has patient been a risk to self within the past 6 months prior to admission? : No Suicidal Intent: No Has patient had any suicidal intent within the past 6 months prior to admission? : No Is patient at risk for suicide?: No Suicidal Plan?: No Has patient had any suicidal plan within the past 6 months prior to admission? : No Access to Means: No What has been your use of drugs/alcohol within the last 12 months?: Reports of none Previous Attempts/Gestures: No Other Self Harm Risks: Reports of none Triggers for Past Attempts: None known Intentional Self Injurious Behavior: None Family Suicide History: No Recent stressful life event(s): Other (Comment) Persecutory voices/beliefs?: No Depression: No Depression Symptoms: Feeling angry/irritable Substance abuse history and/or treatment for substance abuse?: No Suicide prevention information given to non-admitted patients: Not applicable  Risk to Others within the past 6 months Homicidal Ideation: No Does patient have any lifetime risk of violence toward others beyond the six months prior to  admission? : No Thoughts of Harm to Others: No Current Homicidal Intent: No Current Homicidal Plan: No Access to Homicidal  Means: No Identified Victim: Reports of none History of harm to others?: No Assessment of Violence: None Noted Violent Behavior Description: Reports of none Does patient have access to weapons?: No Does patient have a court date: No Is patient on probation?: No  Psychosis Hallucinations: None noted Delusions: None noted  Mental Status Report Appearance/Hygiene: Unremarkable, In scrubs Eye Contact: Fair Motor Activity: Freedom of movement Speech: Logical/coherent, Unremarkable Level of Consciousness: Alert Mood: Anxious, Depressed, Sad, Pleasant Affect: Appropriate to circumstance, Anxious Anxiety Level: None Thought Processes: Coherent, Relevant Judgement: Unimpaired Orientation: Person, Place, Time, Situation, Appropriate for developmental age Obsessive Compulsive Thoughts/Behaviors: Moderate  Cognitive Functioning Concentration: Decreased Memory: Recent Intact, Remote Intact Is patient IDD: No Insight: Fair Impulse Control: Fair Appetite: Good Have you had any weight changes? : No Change Sleep: Decreased Total Hours of Sleep: (UTA) Vegetative Symptoms: None  ADLScreening [facility] Assessment Services) Patient's cognitive ability adequate to safely complete daily activities?: Yes Patient able to express need for assistance with ADLs?: Yes Independently performs ADLs?: Yes (appropriate for developmental age)  Prior Inpatient Therapy Prior Inpatient Therapy: Yes Prior Therapy Dates: Multiple Hospitalizations  Prior Therapy Facilty/Provider(s): Multiple Hospitalizations  Reason for Treatment: Psychosis  Prior Outpatient Therapy Prior Outpatient Therapy: (Unknown)  ADL Screening (condition at time of admission) Patient's cognitive ability adequate to safely complete daily activities?: Yes Is the patient deaf or have difficulty hearing?: No Does the patient have difficulty seeing, even when wearing glasses/contacts?: No Does the patient have difficulty concentrating,  remembering, or making decisions?: No Patient able to express need for assistance with ADLs?: Yes Does the patient have difficulty dressing or bathing?: No Independently performs ADLs?: Yes (appropriate for developmental age) Does the patient have difficulty walking or climbing stairs?: No Weakness of Legs: None Weakness of Arms/Hands: None  Home Assistive Devices/Equipment Home Assistive Devices/Equipment: None  Therapy Consults (therapy consults require a physician order) PT Evaluation Needed: No OT Evalulation Needed: No SLP Evaluation Needed: No Abuse/Neglect Assessment (Assessment to be complete while patient is alone) Abuse/Neglect Assessment Can Be Completed: Yes Physical Abuse: Denies Verbal Abuse: Denies Sexual Abuse: Denies Exploitation of patient/patient's resources: Denies Self-Neglect: Denies Values / Beliefs Cultural Requests During Hospitalization: None Spiritual Requests During Hospitalization: None Consults Spiritual Care Consult Needed: No Transition of Care Team Consult Needed: No Advance Directives (For Healthcare) Does Patient Have a Medical Advance Directive?: No Would patient like information on creating a medical advance directive?: No - Patient declined  Child/Adolescent Assessment Running Away Risk: Denies(Patient is an adult)  Disposition:  Disposition Initial Assessment Completed for this Encounter: Yes  On Site Evaluation by:   Reviewed with Physician:    Gunnar Fusi MS, LCAS, [facility], [place] Therapeutic Triage Specialist 12/14/2019 4:44 PM

## 2019-12-15 NOTE — H&P (Signed)
Psychiatric Admission Assessment Adult  Patient Identification: Shelby Jordan MRN:  [id number] Date of Evaluation:  12/15/2019 Chief Complaint:  Bipolar disorder (manic depression) ([place]) [F31.9] Principal Diagnosis: Bipolar I disorder, most recent episode (or current) manic ([place]) Diagnosis:  Principal Problem:   Bipolar I disorder, most recent episode (or current) manic ([place]) Active Problems:   Essential hypertension  History of Present Illness: Patient seen and chart reviewed.  Also spoke with her granddaughter (with the patient's consent).  This is a 69 year old woman who was brought to the hospital by her family because of concern about manic behavior being unsafe at home.  She has only recently shown up in [place] about a week ago.  Since then she has been staying with family but they have noticed worsening manic behaviors.  She is up all night, she talks nonstop and often does not make much sense, she wanders away from the home at times sometimes banging on other people's doors or doing other things that could get her in trouble.  Family is concerned that she does not appear to have any of her psychiatric medicine and might be unsafe during the daytime.  On interview the patient he is insightful and freely tells me that she has bipolar disorder and that she is supposed to be taking Depakote.  She is a bit confused and it is hard to tell how long she has been out of it from what she is saying but according to the family she was in reasonably good shape when she first showed up and now has decompensated so it may be pretty recent that she ran out of it.  Denies any and no evidence of alcohol or drug abuse.  Patient is actually not necessarily psychotic in the normal sense.  Does not report any hallucinations.  She is not hyper religious and not obviously delusional.  Not threatening or violent and denies suicidal ideation.  Evidently however she has been wandering a bit around the country.  She  came here to [place] from [place] where she had been staying with another relative but she normally lives in [place].  Whether all of this represents mania or not I am not sure.  Patient is definitely hyperverbal.  Without my asking any questions I learned within a couple of minutes that she used to be married to a man from [place], that she left him because she discovered he was a Water engineer, that she used to work for the [employer], and where I should shop in [place] for several different specific items.  Patient tells me she normally takes Depakote 500 mg 4 times a day and is happy to restart it.  She says she usually takes Benadryl at night to help her sleep. Associated Signs/Symptoms: Depression Symptoms:  None (Hypo) Manic Symptoms:  Distractibility, Elevated Mood, Flight of Ideas, Impulsivity, Anxiety Symptoms:  None necessarily Psychotic Symptoms:  Very disorganized thinking but does not appear to be hallucinating or delusional PTSD Symptoms: Negative Total Time spent with patient: 1 hour  Past Psychiatric History: Past history of a diagnosis of bipolar disorder.  She tells me that she has had hospitalizations in the past but it was several years ago the last time this happened.  She denies ever having tried to kill her self.  She recalls that in the past she has also been treated with lithium.  It was unclear to me how recently that had been changed.  Denies any history of violence.  A CT scan of her brain was obtained on presentation and a review of it shows that she has an old left basal ganglia infarct but no new changes.  Not sure why it she would have this and she does not apparently have high blood pressure or diabetes.  In any case it is not clear to me whether this infarct is related in any way to her symptoms or not.  Family reports that when she gets manic she will have "seizures" but it is also unclear whether these are true seizures or just behavioral  episodes  Is the patient at risk to self? Yes.    Has the patient been a risk to self in the past 6 months? No.  Has the patient been a risk to self within the distant past? Yes.    Is the patient a risk to others? No.  Has the patient been a risk to others in the past 6 months? No.  Has the patient been a risk to others within the distant past? No.   Prior Inpatient Therapy:   Prior Outpatient Therapy:    Alcohol Screening: 1. How often do you have a drink containing alcohol?: Never 2. How many drinks containing alcohol do you have on a typical day when you are drinking?: 1 or 2 3. How often do you have six or more drinks on one occasion?: Never AUDIT-C Score: 0 4. How often during the last year have you found that you were not able to stop drinking once you had started?: Never 5. How often during the last year have you failed to do what was normally expected from you because of drinking?: Never 6. How often during the last year have you needed a first drink in the morning to get yourself going after a heavy drinking session?: Never 7. How often during the last year have you had a feeling of guilt of remorse after drinking?: Never 8. How often during the last year have you been unable to remember what happened the night before because you had been drinking?: Never 9. Have you or someone else been injured as a result of your drinking?: No 10. Has a relative or friend or a doctor or another health worker been concerned about your drinking or suggested you cut down?: No Alcohol Use Disorder Identification Test Final Score (AUDIT): 0 Alcohol Brief Interventions/Follow-up: AUDIT Score <7 follow-up not indicated Substance Abuse History in the last 12 months:  No. Consequences of Substance Abuse: Negative Previous Psychotropic Medications: Yes  Psychological Evaluations: Yes  Past Medical History:  Past Medical History:  Diagnosis Date  . Bipolar 1 disorder ([place])   . Hepatitis C   .  Hypertension     Past Surgical History:  Procedure Laterality Date  . APPENDECTOMY    . TOE AMPUTATION     Family History: History reviewed. No pertinent family history. Family Psychiatric  History: None reported Tobacco Screening:   Social History:  Social History   Substance and Sexual Activity  Alcohol Use Never     Social History   Substance and Sexual Activity  Drug Use Never    Additional Social History: Marital status: Divorced Does patient have children?: Yes How many children?: 2 How is patient's relationship with their children?: Per the patient's granddaughter: "good"                         Allergies:  No Known Allergies Lab Results:  Results for  orders placed or performed during the hospital encounter of 12/14/19 (from the past 48 hour(s))  TSH     Status: None   Collection Time: 12/15/19  7:09 AM  Result Value Ref Range   TSH 1.540 0.350 - 4.500 uIU/mL    Comment: Performed by a 3rd Generation assay with a functional sensitivity of <=0.01 uIU/mL. Performed at [facility], [address] [place] [address]     Blood Alcohol level:  Lab Results  Component Value Date   [facility] <10 [id number]    Metabolic Disorder Labs:  No results found for: HGBA1C, MPG No results found for: PROLACTIN No results found for: CHOL, TRIG, HDL, CHOLHDL, VLDL, LDLCALC  Current Medications: Current Facility-Administered Medications  Medication Dose Route Frequency Provider Last Rate Last Admin  . acetaminophen (TYLENOL) tablet 650 mg  650 mg Oral Q6H PRN Eulas Post, MD      . alum & mag hydroxide-simeth (MAALOX/MYLANTA) 200-200-20 MG/5ML suspension 30 mL  30 mL Oral Q4H PRN Eulas Post, MD      . amLODipine (NORVASC) tablet 5 mg  5 mg Oral Daily Daisi Kentner, Madie Ren[place], MD   5 mg at 12/15/19 1425  . amoxicillin (AMOXIL) capsule 500 mg  500 mg Oral Q12H Caroline Sauger, NP   500 mg at 12/15/19 0829  . diphenhydrAMINE (BENADRYL) capsule 25  mg  25 mg Oral QHS PRN Kazaria Gaertner T, MD      . divalproex (DEPAKOTE) DR tablet 500 mg  500 mg Oral QID Shaka Cardin T, MD   500 mg at 12/15/19 1425  . hydrOXYzine (ATARAX/VISTARIL) tablet 10 mg  10 mg Oral TID PRN Eulas Post, MD   10 mg at 12/15/19 0136  . magnesium hydroxide (MILK OF MAGNESIA) suspension 30 mL  30 mL Oral Daily PRN Eulas Post, MD       PTA Medications: No medications prior to admission.    Musculoskeletal: Strength & Muscle Tone: within normal limits Gait & Station: normal Patient leans: N/A  Psychiatric Specialty Exam: Physical Exam  Nursing note and vitals reviewed. Constitutional: She appears well-developed and well-nourished.  HENT:  Head: Normocephalic and atraumatic.  Eyes: Pupils are equal, round, and reactive to light. Conjunctivae are normal.  Cardiovascular: Regular rhythm and normal heart sounds.  Respiratory: Effort normal. No respiratory distress.  GI: Soft.  Musculoskeletal:        General: Normal range of motion.     Cervical back: Normal range of motion.  Neurological: She is alert.  Skin: Skin is warm and dry.  Psychiatric: Her affect is labile. Her speech is rapid and/or pressured and tangential. She is agitated and hyperactive. She is not aggressive and not combative. Cognition and memory are normal. Cognition and memory are not impaired. She expresses impulsivity and inappropriate judgment. She expresses no homicidal and no suicidal ideation.    Review of Systems  Constitutional: Negative.   HENT: Negative.   Eyes: Negative.   Respiratory: Negative.   Cardiovascular: Negative.   Gastrointestinal: Negative.   Musculoskeletal: Negative.   Skin: Negative.   Neurological: Negative.   Psychiatric/Behavioral: Positive for behavioral problems and sleep disturbance. The patient is nervous/anxious and is hyperactive.     Blood pressure (!) 179/104, pulse 86, temperature 99 F (37.2 C), temperature source Oral, resp. rate 18,  height 5' 4.5" (1.638 m), weight 67.1 kg, SpO2 99 %.Body mass index is 25.01 kg/m.  General Appearance: Casual  Eye Contact:  Good  Speech:  Pressured  Volume:  Increased  Mood:  Euphoric  Affect:  Congruent  Thought Process:  Disorganized  Orientation:  Full (Time, Place, and Person)  Thought Content:  Illogical, Rumination and Tangential  Suicidal Thoughts:  No  Homicidal Thoughts:  No  Memory:  Immediate;   Fair Recent;   Fair Remote;   Fair  Judgement:  Fair  Insight:  Fair  Psychomotor Activity:  Increased  Concentration:  Concentration: Fair  Recall:  [employer] of Knowledge:  Fair  Language:  Fair  Akathisia:  No  Handed:  Right  AIMS (if indicated):     Assets:  Desire for Improvement Housing Physical Health Resilience Social Support  ADL's:  Intact  Cognition:  WNL  Sleep:  Number of Hours: 2.5    Treatment Plan Summary: Daily contact with patient to assess and evaluate symptoms and progress in treatment, Medication management and Plan Labs reviewed.  Spoke with family.  Restarting Depakote 500 mg 4 times a day.  The patient makes it clear that she prefers this schedule for it rather than trying to switch to 2 times a day.  Benadryl as needed for sleep.  I will add some extra as needed as well to see if we can make sure she gets a decent night sleep.  She appears to have mild high blood pressure which she claims to be unaware of so I will add amlodipine.  Continue 15-minute checks.  Daily involvement in groups and activities.  Observation Level/Precautions:  15 minute checks  Laboratory:  Chemistry Profile  Psychotherapy:    Medications:    Consultations:    Discharge Concerns:    Estimated LOS:  Other:     Physician Treatment Plan for Primary Diagnosis: Bipolar I disorder, most recent episode (or current) manic ([place]) Long Term Goal(s): Improvement in symptoms so as ready for discharge  Short Term Goals: Ability to verbalize feelings will improve and  Ability to demonstrate self-control will improve  Physician Treatment Plan for Secondary Diagnosis: Principal Problem:   Bipolar I disorder, most recent episode (or current) manic ([place]) Active Problems:   Essential hypertension  Long Term Goal(s): Improvement in symptoms so as ready for discharge  Short Term Goals: Ability to maintain clinical measurements within normal limits will improve and Compliance with prescribed medications will improve  I certify that inpatient services furnished can reasonably be expected to improve the patient's condition.    Alethia Berthold, MD 5/28/20213:37 PM

## 2019-12-15 NOTE — Plan of Care (Signed)
Patient oriented to unit. Patient's safety is maintained on unit. Patient denies SI/HI/AVH.    Problem: Education: Goal: Knowledge of [facility] Education information/materials will improve Outcome: Not Progressing Goal: Emotional status will improve Outcome: Not Progressing Goal: Mental status will improve Outcome: Not Progressing Goal: Verbalization of understanding the information provided will improve Outcome: Not Progressing

## 2019-12-15 NOTE — BHH Suicide Risk Assessment (Signed)
[facility] Admission Suicide Risk Assessment   Nursing information obtained from:  Patient Demographic factors:  Age 69 or older, Divorced or widowed, Unemployed Current Mental Status:  NA Loss Factors:  NA Historical Factors:  NA Risk Reduction Factors:  Sense of responsibility to family, Positive social support  Total Time spent with patient: 1 hour Principal Problem: Bipolar I disorder, most recent episode (or current) manic ([place]) Diagnosis:  Principal Problem:   Bipolar I disorder, most recent episode (or current) manic ([place]) Active Problems:   Essential hypertension  Subjective Data: Patient seen and chart reviewed.  I also spoke, with her consent, to her granddaughter Shelby Jordan.  This is a 69 year old woman only recently relocated to [place] who was brought to the emergency room because of manic behavior.  There is no report of any suicidality or threatening behavior but she had been wandering from house to fight house, wandering off in the night, agitated and the family did not feels like it was safe to leave her by her self.  She appears to have been off her medicine.  Patient is clearly manic but is pleasant without any threatening or suicidal ideation evident  Continued Clinical Symptoms:  Alcohol Use Disorder Identification Test Final Score (AUDIT): 0 The "Alcohol Use Disorders Identification Test", Guidelines for Use in Primary Care, Second Edition.  World Pharmacologist [facility]). Score between 0-7:  no or low risk or alcohol related problems. Score between 8-15:  moderate risk of alcohol related problems. Score between 16-19:  high risk of alcohol related problems. Score 20 or above:  warrants further diagnostic evaluation for alcohol dependence and treatment.   CLINICAL FACTORS:   Bipolar Disorder:   Mixed State   Musculoskeletal: Strength & Muscle Tone: within normal limits Gait & Station: normal Patient leans: N/A  Psychiatric Specialty Exam: Physical Exam   Nursing note and vitals reviewed. Constitutional: She appears well-developed and well-nourished.  HENT:  Head: Normocephalic and atraumatic.  Eyes: Pupils are equal, round, and reactive to light. Conjunctivae are normal.  Cardiovascular: Regular rhythm and normal heart sounds.  Respiratory: Effort normal.  GI: Soft.  Musculoskeletal:        General: Normal range of motion.     Cervical back: Normal range of motion.  Neurological: She is alert.  Skin: Skin is warm and dry.  Psychiatric: Her affect is labile. Her speech is rapid and/or pressured and tangential. She is agitated and hyperactive. She is not aggressive and not combative. Cognition and memory are impaired. She expresses impulsivity and inappropriate judgment. She expresses no homicidal and no suicidal ideation.    Review of Systems  Constitutional: Negative.   HENT: Negative.   Eyes: Negative.   Respiratory: Negative.   Cardiovascular: Negative.   Gastrointestinal: Negative.   Musculoskeletal: Negative.   Skin: Negative.   Neurological: Negative.   Psychiatric/Behavioral: Positive for agitation, behavioral problems, confusion and sleep disturbance. The patient is nervous/anxious and is hyperactive.     Blood pressure (!) 179/104, pulse 86, temperature 99 F (37.2 C), temperature source Oral, resp. rate 18, height 5' 4.5" (1.638 m), weight 67.1 kg, SpO2 99 %.Body mass index is 25.01 kg/m.  General Appearance: Casual  Eye Contact:  Good  Speech:  Pressured  Volume:  Increased  Mood:  Euphoric  Affect:  Congruent  Thought Process:  Disorganized  Orientation:  Full (Time, Place, and Person)  Thought Content:  Rumination and Tangential  Suicidal Thoughts:  No  Homicidal Thoughts:  No  Memory:  Immediate;  Fair Recent;   Fair Remote;   Fair  Judgement:  Fair  Insight:  Fair  Psychomotor Activity:  Increased  Concentration:  Concentration: Fair  Recall:  [employer] of Knowledge:  Fair  Language:  Fair   Akathisia:  No  Handed:  Right  AIMS (if indicated):     Assets:  Communication Skills Desire for Improvement Physical Health Resilience Social Support  ADL's:  Impaired  Cognition:  WNL  Sleep:  Number of Hours: 2.5      COGNITIVE FEATURES THAT CONTRIBUTE TO RISK:  None    SUICIDE RISK:   Minimal: No identifiable suicidal ideation.  Patients presenting with no risk factors but with morbid ruminations; may be classified as minimal risk based on the severity of the depressive symptoms  PLAN OF CARE: Patient is willing to restart her medicine for bipolar disorder which appears to be Depakote.  Continue 15-minute checks.  As needed medicine available as needed.  Try to treat her blood pressure.  Stay in touch with family and work on arranging for safe disposition plan  I certify that inpatient services furnished can reasonably be expected to improve the patient's condition.   Alethia Berthold, MD 12/15/2019, 3:33 PM

## 2019-12-15 NOTE — Progress Notes (Signed)
Admission Note:  69 yr female who presents IVC in no acute distress for the treatment of Mania and worsening mood swings, wandering to different cities and acting bizarre. Patient appears restless, speech is pressured and tangential , thoughts are disorganized and incoherent for most part.  Patient was euphoric high of energy with admission process, she currently denies SI/HI/AVH and contracts for safety upon admission. Patient explained to staff she was off her medication for about 4 years., patient was noted delusional has some false beliefs. Per report the daughter has concerns about her mental health.  Patient has Past medical Hx of  Bipolar 1 disorder, Hepatitis C and Hypertension. 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 Patient was  searched and no contraband found, POC and unit policies explained and understanding verbalized. Consents obtained. food and fluids offered, and both accepted, 15 minutes safety checks maintained will continue to monitor

## 2019-12-15 NOTE — BHH Group Notes (Signed)
LCSW Group Therapy Note  12/15/2019 2:19 PM  Type of Therapy and Topic:  Group Therapy:  Feelings around Relapse and Recovery  Participation Level:  None   Description of Group:    Patients in this group will discuss emotions they experience before and after a relapse. They will process how experiencing these feelings, or avoidance of experiencing them, relates to having a relapse. Facilitator will guide patients to explore emotions they have related to recovery. Patients will be encouraged to process which emotions are more powerful. They will be guided to discuss the emotional reaction significant others in their lives may have to their relapse or recovery. Patients will be assisted in exploring ways to respond to the emotions of others without this contributing to a relapse.  Therapeutic Goals: 1. Patient will identify two or more emotions that lead to a relapse for them 2. Patient will identify two emotions that result when they relapse 3. Patient will identify two emotions related to recovery 4. Patient will demonstrate ability to communicate their needs through discussion and/or role plays   Summary of Patient Progress: Patient was present for group.  Patient was intrusive and often spoke out of turn. Patient was not able to be redirected.    Therapeutic Modalities:   Cognitive Behavioral Therapy Solution-Focused Therapy Assertiveness Training Relapse Prevention Therapy   Assunta Curtis, MSW, LCSW 12/15/2019 2:19 PM
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
## 2019-12-15 NOTE — Tx Team (Signed)
Initial Treatment Plan 12/15/2019 2:35 AM Shelby Jordan [id number]    PATIENT STRESSORS: Marital or family conflict Medication change or noncompliance   PATIENT STRENGTHS: Capable of independent living Supportive family/friends   PATIENT IDENTIFIED PROBLEMS: Medication non compliance  Access to care                   DISCHARGE CRITERIA:  Improved stabilization in mood, thinking, and/or behavior Motivation to continue treatment in a less acute level of care Verbal commitment to aftercare and medication compliance  PRELIMINARY DISCHARGE PLAN: Return to previous living arrangement  PATIENT/FAMILY INVOLVEMENT: This treatment plan has been presented to and reviewed with the patient, Shelby Jordan.  The patient has been given the opportunity to ask questions and make suggestions.  Pearson Reasons L Butler-Nicholson, LPN [id number], [id number] AM

## 2019-12-15 NOTE — BHH Group Notes (Signed)
[facility] Group Notes:  (Nursing/MHT/Case Management/Adjunct)  Date:  12/15/2019  Time:  10:37 PM  Type of Therapy:  Group Therapy  Participation Level:  Active  Participation Quality:  Sharing and left group wet her clothes.   Nehemiah Settle 12/15/2019, 10:37 PM

## 2019-12-15 NOTE — Progress Notes (Signed)
Recreation Therapy Notes   Date: 12/15/2019  Time: 9:30 am  Location: Craft room  Behavioral response: Appropriate,hyperverbal   Intervention Topic: Happiness    Discussion/Intervention:  Group content today was focused on Happiness. The group defined happiness and described where happiness comes from. Individuals identified what makes them happy and how they go about making others happy. Patients expressed things that stop them from being happy and ways they can improve their happiness. The group stated reasons why it is important to be happy. The group participated in the intervention "My Happiness", where they had a chance to identify and express things that make them happy. Clinical Observations/Feedback:  Patient came to group and defined happiness as someone accepting her for who she is. She identified her happiness as giving birth and her family. Participant expressed that happiness comes from the heart. Individual was social with peers and staff while participating in the intervention. Patient went on a tangent about, ducks, birds, music, death and religion; she was redirected several time to focus on the task at hand.    Norris Bodley LRT/CTRS         Barnes Florek 12/15/2019 1:07 PM

## 2019-12-15 NOTE — Plan of Care (Signed)
  Problem: Education: Goal: Knowledge of [facility] Education information/materials will improve Outcome: Not Progressing Goal: Emotional status will improve Outcome: Not Progressing Goal: Mental status will improve Outcome: Not Progressing Goal: Verbalization of understanding the information provided will improve Outcome: Not Progressing   Problem: Activity: Goal: Interest or engagement in activities will improve Outcome: Not Progressing Goal: Sleeping patterns will improve Outcome: Not Progressing   Problem: Coping: Goal: Ability to verbalize frustrations and anger appropriately will improve Outcome: Not Progressing Goal: Ability to demonstrate self-control will improve Outcome: Not Progressing   Problem: Health Behavior/Discharge Planning: Goal: Identification of resources available to assist in meeting health care needs will improve Outcome: Not Progressing Goal: Compliance with treatment plan for underlying cause of condition will improve Outcome: Not Progressing   Problem: Physical Regulation: Goal: Ability to maintain clinical measurements within normal limits will improve Outcome: Not Progressing   Problem: Safety: Goal: Periods of time without injury will increase Outcome: Not Progressing   Problem: Activity: Goal: Will verbalize the importance of balancing activity with adequate rest periods Outcome: Not Progressing   Problem: Education: Goal: Will be free of psychotic symptoms Outcome: Not Progressing Goal: Knowledge of the prescribed therapeutic regimen will improve Outcome: Not Progressing   Problem: Coping: Goal: Coping ability will improve Outcome: Not Progressing Goal: Will verbalize feelings Outcome: Not Progressing   Problem: Health Behavior/Discharge Planning: Goal: Compliance with prescribed medication regimen will improve Outcome: Not Progressing   Problem: Nutritional: Goal: Ability to achieve adequate nutritional intake will  improve Outcome: Not Progressing   Problem: Role Relationship: Goal: Ability to communicate needs accurately will improve Outcome: Not Progressing Goal: Ability to interact with others will improve Outcome: Not Progressing   Problem: Safety: Goal: Ability to redirect hostility and anger into socially appropriate behaviors will improve Outcome: Not Progressing Goal: Ability to remain free from injury will improve Outcome: Not Progressing   Problem: Self-Care: Goal: Ability to participate in self-care as condition permits will improve Outcome: Not Progressing   Problem: Self-Concept: Goal: Will verbalize positive feelings about self Outcome: Not Progressing   

## 2019-12-15 NOTE — BHH Counselor (Signed)
CSW attempted to complete assessment with patient, however, pt was disorganized.    Pt displayed flight of ideas and mania.  Assunta Curtis, MSW, LCSW 12/15/2019 10:49 AM

## 2019-12-16 DIAGNOSIS — F3110 Bipolar disorder, current episode manic without psychotic features, unspecified: Secondary | ICD-10-CM | POA: Diagnosis not present

## 2019-12-16 LAB — URINALYSIS, COMPLETE (UACMP) WITH MICROSCOPIC
Bilirubin Urine: NEGATIVE
Glucose, UA: NEGATIVE mg/dL
Hgb urine dipstick: NEGATIVE
Ketones, ur: 5 mg/dL — AB
Leukocytes,Ua: NEGATIVE
Nitrite: NEGATIVE
Protein, ur: 30 mg/dL — AB
Specific Gravity, Urine: 1.015 (ref 1.005–1.030)
pH: 5 (ref 5.0–8.0)

## 2019-12-16 LAB — HEMOGLOBIN A1C
Hgb A1c MFr Bld: 6.3 % — ABNORMAL HIGH (ref 4.8–5.6)
Mean Plasma Glucose: 134.11 mg/dL

## 2019-12-16 MED ORDER — AMLODIPINE BESYLATE 5 MG PO TABS
5.0000 mg | ORAL_TABLET | ORAL | Status: AC
  Administered 2019-12-16: 5 mg via ORAL
  Filled 2019-12-16: qty 1

## 2019-12-16 MED ORDER — CLONIDINE HCL 0.1 MG PO TABS: 0.1000 mg | ORAL_TABLET | Freq: Three times a day (TID) | ORAL | Status: DC | PRN

## 2019-12-16 MED ORDER — LISINOPRIL 5 MG PO TABS
5.0000 mg | ORAL_TABLET | Freq: Every day | ORAL | Status: DC
  Administered 2019-12-16 – 2019-12-17 (×2): 5 mg via ORAL
  Filled 2019-12-16 (×2): qty 1

## 2019-12-16 MED ORDER — AMLODIPINE BESYLATE 5 MG PO TABS
10.0000 mg | ORAL_TABLET | Freq: Every day | ORAL | Status: DC
  Administered 2019-12-17 – 2019-12-27 (×11): 10 mg via ORAL
  Filled 2019-12-16 (×12): qty 2

## 2019-12-16 NOTE — Progress Notes (Signed)
[facility] MD Progress Note  12/16/2019 11:44 AM Shelby Jordan  MRN:  [id number] Subjective: Patient is a 69 year old female with a probable past psychiatric history significant for bipolar disorder who presented to the [facility] on 12/15/2019 secondary to manic symptoms.  Objective: Patient is seen and examined.  Patient is a 69 year old female who was admitted on 5/28 secondary to mania, disorganization, pressured speech and tangentiality.  On examination today she remains tangential.  She stated that "I do not have a seizure disorder".  I attempted to explain to her that Dr. Weber Cooks had started her back on Depakote for bipolar disorder.  She remains pressured, agitated, intrusive and at times quite disorganized.  She talks about being in [facility] currently, but when I try to reorient her she goes back to realizing that she is in [place].  She apparently was previously taking Depakote 500 mg 4 times daily, and agreed to go back on it.  Review of her admission laboratories revealed a glucose that was elevated at 142, creatinine that was mildly elevated at 1.14.  Liver function enzymes were normal.  Her CBC was completely normal (and I suspect that she has been noncompliant with the Depakote for quite a while given this normality).  Acetaminophen was less than 10, salicylate was less than 7.  TSH was 1.540.  Urinalysis showed few bacteria and greater than 50 white blood cells, but unfortunately had greater than 50 squamous epithelial cells.  Drug screen was completely negative.  CT scan of the head revealed an age indeterminate small vessel ischemic changes within the periventricular white matter and basal ganglia.  It was thought to be chronic in nature.  There was a chronic left basal ganglia lacunar infarct.  No acute intracranial process.  EKG showed a sinus rhythm with PVCs.  Her QTc interval was increased at 490.  Her current medications include amlodipine, amoxicillin,  Depakote, Benadryl and temazepam.  Her blood pressure is significantly elevated at 168/99.  Her heart rate was 156 this a.m.  She is afebrile.  Her pulse oximetry was 100% on room air.  She only slept 4.75 hours last night.  Principal Problem: Bipolar I disorder, most recent episode (or current) manic ([place]) Diagnosis: Principal Problem:   Bipolar I disorder, most recent episode (or current) manic ([place]) Active Problems:   Essential hypertension  Total Time spent with patient: 20 minutes  Past Psychiatric History: See admission H&P  Past Medical History:  Past Medical History:  Diagnosis Date  . Bipolar 1 disorder ([place])   . Hepatitis C   . Hypertension     Past Surgical History:  Procedure Laterality Date  . APPENDECTOMY    . TOE AMPUTATION     Family History: History reviewed. No pertinent family history. Family Psychiatric  History: See admission H&P Social History:  Social History   Substance and Sexual Activity  Alcohol Use Never     Social History   Substance and Sexual Activity  Drug Use Never    Social History   Socioeconomic History  . Marital status: Legally Separated    Spouse name: Not on file  . Number of children: Not on file  . Years of education: Not on file  . Highest education level: Not on file  Occupational History  . Not on file  Tobacco Use  . Smoking status: Never Smoker  . Smokeless tobacco: Never Used  Substance and Sexual Activity  . Alcohol use: Never  . Drug use: Never  .  Sexual activity: Not on file  Other Topics Concern  . Not on file  Social History Narrative  . Not on file   Social Determinants of Health   Financial Resource Strain:   . Difficulty of Paying Living Expenses:   Food Insecurity:   . Worried About Charity fundraiser in the Last Year:   . Arboriculturist in the Last Year:   Transportation Needs:   . Film/video editor (Medical):   Marland Kitchen Lack of Transportation (Non-Medical):   Physical Activity:   . Days of  Exercise per Week:   . Minutes of Exercise per Session:   Stress:   . Feeling of Stress :   Social Connections:   . Frequency of Communication with Friends and Family:   . Frequency of Social Gatherings with Friends and Family:   . Attends Religious Services:   . Active Member of Clubs or Organizations:   . Attends Archivist Meetings:   Marland Kitchen Marital Status:    Additional Social History:                         Sleep: Poor  Appetite:  Good  Current Medications: Current Facility-Administered Medications  Medication Dose Route Frequency Provider Last Rate Last Admin  . acetaminophen (TYLENOL) tablet 650 mg  650 mg Oral Q6H PRN Eulas Post, MD      . alum & mag hydroxide-simeth (MAALOX/MYLANTA) 200-200-20 MG/5ML suspension 30 mL  30 mL Oral Q4H PRN Eulas Post, MD      . amLODipine (NORVASC) tablet 5 mg  5 mg Oral Daily Clapacs, Madie Reno, MD   5 mg at 12/16/19 0836  . amoxicillin (AMOXIL) capsule 500 mg  500 mg Oral Q12H Caroline Sauger, NP   500 mg at 12/16/19 0836  . diphenhydrAMINE (BENADRYL) capsule 25 mg  25 mg Oral QHS PRN Clapacs, Madie Reno, MD   25 mg at 12/15/19 2032  . divalproex (DEPAKOTE) DR tablet 500 mg  500 mg Oral QID Clapacs, Madie Reno, MD   500 mg at 12/16/19 0836  . hydrOXYzine (ATARAX/VISTARIL) tablet 10 mg  10 mg Oral TID PRN Eulas Post, MD   10 mg at 12/15/19 0136  . magnesium hydroxide (MILK OF MAGNESIA) suspension 30 mL  30 mL Oral Daily PRN Eulas Post, MD      . temazepam (RESTORIL) capsule 30 mg  30 mg Oral QHS PRN Clapacs, Madie Reno, MD   30 mg at 12/15/19 2032    Lab Results:  Results for orders placed or performed during the hospital encounter of 12/14/19 (from the past 48 hour(s))  TSH     Status: None   Collection Time: 12/15/19  7:09 AM  Result Value Ref Range   TSH 1.540 0.350 - 4.500 uIU/mL    Comment: Performed by a 3rd Generation assay with a functional sensitivity of <=0.01 uIU/mL. Performed at [facility], [address] [place], [place] [address]     Blood Alcohol level:  Lab Results  Component Value Date   [facility] <10 [id number]    Metabolic Disorder Labs: No results found for: HGBA1C, MPG No results found for: PROLACTIN No results found for: CHOL, TRIG, HDL, CHOLHDL, VLDL, LDLCALC  Physical Findings: AIMS:  , ,  ,  ,    CIWA:    COWS:     Musculoskeletal: Strength & Muscle Tone: within normal limits Gait & Station: normal Patient leans: N/A  Psychiatric  Specialty Exam: Physical Exam  Nursing note and vitals reviewed. Constitutional: She is oriented to person, place, and time. She appears well-developed and well-nourished.  HENT:  Head: Normocephalic and atraumatic.  Respiratory: Effort normal.  Neurological: She is alert and oriented to person, place, and time.    Review of Systems  Blood pressure (!) 168/99, pulse (!) 156, temperature (!) 97.4 F (36.3 C), temperature source Oral, resp. rate 18, height 5' 4.5" (1.638 m), weight 67.1 kg, SpO2 100 %.Body mass index is 25.01 kg/m.  General Appearance: Disheveled  Eye Contact:  Minimal  Speech:  Pressured  Volume:  Increased  Mood:  Dysphoric  Affect:  Labile  Thought Process:  Coherent and Descriptions of Associations: Circumstantial  Orientation:  Negative  Thought Content:  Tangential  Suicidal Thoughts:  No  Homicidal Thoughts:  No  Memory:  Immediate;   Poor Recent;   Poor Remote;   Poor  Judgement:  Impaired  Insight:  Fair  Psychomotor Activity:  Increased  Concentration:  Concentration: Poor and Attention Span: Poor  Recall:  Poor  Fund of Knowledge:  Fair  Language:  Good  Akathisia:  Negative  Handed:  Right  AIMS (if indicated):     Assets:  Desire for Improvement Resilience  ADL's:  Intact  Cognition:  Impaired,  Mild  Sleep:  Number of Hours: 4.75     Treatment Plan Summary: Daily contact with patient to assess and evaluate symptoms and progress in treatment, Medication  management and Plan : Patient is seen and examined.  Patient is a 69 year old female with the above-stated past psychiatric history who is seen in follow-up.   Diagnosis: 1.  Bipolar disorder, most recently mixed, severe 2.  Hypertension 3.  Cerebrovascular disease 4.  Prolonged QTc interval 5.  Presumptive urinary tract infection on amoxicillin 6.  Hyperglycemia  Findings on examination: 1.  Continued pressured speech, tangentiality. 2.  Continued intrusiveness 3.  Mild cognitive disorder but unclear whether secondary to her racing thoughts or whether she has any cognitive dysfunction secondary to cerebrovascular disease 4.  Poorly controlled hypertension 5.  Hyperglycemia  Plan: 1.  Continue Depakote DR 500 mg p.o. 4 times daily for bipolar disorder 2.  Continue to monitor cognitive status with improvement of bipolar disorder to assess any cognitive dysfunction secondary to cerebrovascular disease 3.  Increase Norvasc to 10 mg p.o. daily for hypertension. 4.  Most likely patient will require more than Norvasc 10 mg p.o. daily for hypertension, and given possible diabetes and mildly elevated creatinine we will start lisinopril 10 mg p.o. daily. 5.  Add hemoglobin A1c to previously drawn blood. 6.  Will write for low-dose Haldol 5 mg 3 times daily as needed agitation, and will monitor any continued prolonged QTc interval. 7.  Disposition planning-in progress.  Sharma Covert, MD 12/16/2019, 11:44 AM

## 2019-12-16 NOTE — Progress Notes (Signed)
Pt is alert and oriented to person, but not to place, time or situation. Pt is noted to have disorganized thoughts, is very tangential during conversation, hyperactive, hypertalkative, speech is pressured, pt is intrusive and impulsive. Pt is able to follow simple commands, is visible in the dayroom social with peers and staff, attempts to call her grandchildren often but it goes to voicemail. Pt denies suicidal and homicidal ideation, denies hallucinations, denies feelings of anxiety and depression. Pt has poor insight and judgement. Pt is medication compliant, appetite is good. No distress noted, none reported, pt voices no complaints, described her mood has "good." Will continue to monitor pt per Q15 minute face checks and monitor for safety and progress.

## 2019-12-17 DIAGNOSIS — F3110 Bipolar disorder, current episode manic without psychotic features, unspecified: Secondary | ICD-10-CM | POA: Diagnosis not present

## 2019-12-17 MED ORDER — LISINOPRIL 20 MG PO TABS
20.0000 mg | ORAL_TABLET | Freq: Every day | ORAL | Status: DC
  Administered 2019-12-18: 20 mg via ORAL
  Filled 2019-12-17: qty 1

## 2019-12-17 MED ORDER — LISINOPRIL 20 MG PO TABS
10.0000 mg | ORAL_TABLET | ORAL | Status: AC
  Administered 2019-12-17: 10 mg via ORAL
  Filled 2019-12-17: qty 1

## 2019-12-17 NOTE — Progress Notes (Signed)
Patient has been fixated on calling her granddaughter. Continues to be tangential and hyperverbal. Denies SI HI and AVH. Was found walking around naked in her room. Had been incontinent of urine on her floor. Requested towels to clean it up.

## 2019-12-17 NOTE — BHH Group Notes (Signed)
[facility] LCSW Group Therapy Note  Date/Time:  12/17/2019 1315  Type of Therapy and Topic:  Group Therapy:  Healthy and Unhealthy Supports  Participation Level:  Active   Description of Group:  Patients in this group were introduced to the idea of adding a variety of healthy supports to address the various needs in their lives.Patients discussed what additional healthy supports could be helpful in their recovery and wellness after discharge in order to prevent future hospitalizations.   An emphasis was placed on using counselor, doctor, therapy groups, 12-step groups, and problem-specific support groups to expand supports.  They also worked as a group on developing a specific plan for several patients to deal with unhealthy supports through [place], psychoeducation with loved ones, and even termination of relationships.   Therapeutic Goals:   1)  discuss importance of adding supports to stay well once out of the hospital  2)  compare healthy versus unhealthy supports and identify some examples of each  3)  generate ideas and descriptions of healthy supports that can be added  4)  offer mutual support about how to address unhealthy supports  5)  encourage active participation in and adherence to discharge plan     Summary of Patient Progress:  The patient actively engaged in introductory check-in, scaling self at "7" stating "I feel alright today, my granddaughter brought my stuff for me". Pt displayed tangential thought content and pressured speech, attempting to direct group discussion to alternate topics. Pt identified personal examples of supports to be doctors, family, friends, benefits, and medical coverage as well as agreed with alternate group members definitions of supports. Pt actively engaged in discussion surrounding healthy supports, unhealthy supports, and provided specific examples of such that she has experienced in her past. The patient expressed a willingness to  add a friend to her support network in efforts to aid in her recovery journey. Pt proved receptive to alternate group members input and feedback and redirections from [place].   Therapeutic Modalities:   Motivational Interviewing Brief Solution-Focused Therapy  Blane Ohara, LCSWA 12/17/2019  3:07 PM

## 2019-12-17 NOTE — Plan of Care (Signed)
D- Patient alert and oriented. Patient presents in a preoccupied, but pleasant mood on assessment stating that she slept "good" last night and had no complaints to voice to this Probation officer. Patient is tangential and all over the place with her conversation. Patient reported "they took my blood to [place], I've did the best I could with my daughter and granddaughter". Patient denies SI, HI, AVH, and pain at this time, reporting "all lives matter, ain't nobody talking to me". Patient did state "this left side ain't never gone be the same". Patient's goal for today is "clear communication", in which she will be "calm as possible", in order to achieve her goal.  A- Scheduled medications administered to patient, per MD orders. Support and encouragement provided.  Routine safety checks conducted every 15 minutes.  Patient informed to notify staff with problems or concerns.  R- No adverse drug reactions noted. Patient contracts for safety at this time. Patient compliant with medications and treatment plan. Patient receptive, calm, and cooperative. Patient interacts well with others on the unit.  Patient remains safe at this time.  Problem: Education: Goal: Knowledge of [facility] Education information/materials will improve Outcome: Not Progressing Goal: Emotional status will improve Outcome: Not Progressing Goal: Mental status will improve Outcome: Not Progressing Goal: Verbalization of understanding the information provided will improve Outcome: Not Progressing   Problem: Activity: Goal: Interest or engagement in activities will improve Outcome: Not Progressing Goal: Sleeping patterns will improve Outcome: Not Progressing   Problem: Coping: Goal: Ability to verbalize frustrations and anger appropriately will improve Outcome: Not Progressing Goal: Ability to demonstrate self-control will improve Outcome: Not Progressing   Problem: Health Behavior/Discharge Planning: Goal: Identification of  resources available to assist in meeting health care needs will improve Outcome: Not Progressing Goal: Compliance with treatment plan for underlying cause of condition will improve Outcome: Not Progressing   Problem: Physical Regulation: Goal: Ability to maintain clinical measurements within normal limits will improve Outcome: Not Progressing   Problem: Safety: Goal: Periods of time without injury will increase Outcome: Not Progressing   Problem: Activity: Goal: Will verbalize the importance of balancing activity with adequate rest periods Outcome: Not Progressing   Problem: Education: Goal: Will be free of psychotic symptoms Outcome: Not Progressing Goal: Knowledge of the prescribed therapeutic regimen will improve Outcome: Not Progressing   Problem: Coping: Goal: Coping ability will improve Outcome: Not Progressing Goal: Will verbalize feelings Outcome: Not Progressing   Problem: Health Behavior/Discharge Planning: Goal: Compliance with prescribed medication regimen will improve Outcome: Not Progressing   Problem: Nutritional: Goal: Ability to achieve adequate nutritional intake will improve Outcome: Not Progressing   Problem: Role Relationship: Goal: Ability to communicate needs accurately will improve Outcome: Not Progressing Goal: Ability to interact with others will improve Outcome: Not Progressing   Problem: Safety: Goal: Ability to redirect hostility and anger into socially appropriate behaviors will improve Outcome: Not Progressing Goal: Ability to remain free from injury will improve Outcome: Not Progressing   Problem: Self-Care: Goal: Ability to participate in self-care as condition permits will improve Outcome: Not Progressing   Problem: Self-Concept: Goal: Will verbalize positive feelings about self Outcome: Not Progressing

## 2019-12-17 NOTE — Progress Notes (Signed)
[facility] MD Progress Note  12/17/2019 11:02 AM Quintoria Yeater  MRN:  [id number] Subjective:  Patient is a 69 year old female with a probable past psychiatric history significant for bipolar disorder who presented to the [facility] on 12/15/2019 secondary to manic symptoms.  Objective: Patient is seen and examined.  Patient is a 69 year old female who was admitted on 5/28 secondary to mania, disorganization, pressured speech and tangentiality.  She continues to slightly slow down.  She still pressured and tangential but less so than yesterday.  Her blood pressure remains elevated at 152/92.  She did sleep 7.75 hours last night.  Her laboratories revealed a hemoglobin A1c at 6.3.  Blood sugar this morning is 134.  Principal Problem: Bipolar I disorder, most recent episode (or current) manic ([place]) Diagnosis: Principal Problem:   Bipolar I disorder, most recent episode (or current) manic ([place]) Active Problems:   Essential hypertension  Total Time spent with patient: 20 minutes  Past Psychiatric History: See admission H&P  Past Medical History:  Past Medical History:  Diagnosis Date  . Bipolar 1 disorder ([place])   . Hepatitis C   . Hypertension     Past Surgical History:  Procedure Laterality Date  . APPENDECTOMY    . TOE AMPUTATION     Family History: History reviewed. No pertinent family history. Family Psychiatric  History: See admission H&P Social History:  Social History   Substance and Sexual Activity  Alcohol Use Never     Social History   Substance and Sexual Activity  Drug Use Never    Social History   Socioeconomic History  . Marital status: Legally Separated    Spouse name: Not on file  . Number of children: Not on file  . Years of education: Not on file  . Highest education level: Not on file  Occupational History  . Not on file  Tobacco Use  . Smoking status: Never Smoker  . Smokeless tobacco: Never Used  Substance and Sexual Activity  .  Alcohol use: Never  . Drug use: Never  . Sexual activity: Not on file  Other Topics Concern  . Not on file  Social History Narrative  . Not on file   Social Determinants of Health   Financial Resource Strain:   . Difficulty of Paying Living Expenses:   Food Insecurity:   . Worried About Charity fundraiser in the Last Year:   . Arboriculturist in the Last Year:   Transportation Needs:   . Film/video editor (Medical):   Marland Kitchen Lack of Transportation (Non-Medical):   Physical Activity:   . Days of Exercise per Week:   . Minutes of Exercise per Session:   Stress:   . Feeling of Stress :   Social Connections:   . Frequency of Communication with Friends and Family:   . Frequency of Social Gatherings with Friends and Family:   . Attends Religious Services:   . Active Member of Clubs or Organizations:   . Attends Archivist Meetings:   Marland Kitchen Marital Status:    Additional Social History:                         Sleep: Good  Appetite:  Good  Current Medications: Current Facility-Administered Medications  Medication Dose Route Frequency Provider Last Rate Last Admin  . acetaminophen (TYLENOL) tablet 650 mg  650 mg Oral Q6H PRN Eulas Post, MD      . alum &  mag hydroxide-simeth (MAALOX/MYLANTA) 200-200-20 MG/5ML suspension 30 mL  30 mL Oral Q4H PRN Eulas Post, MD      . amLODipine (NORVASC) tablet 10 mg  10 mg Oral Daily Sharma Covert, MD   10 mg at 12/17/19 [id number]  . amoxicillin (AMOXIL) capsule 500 mg  500 mg Oral Q12H Caroline Sauger, NP   500 mg at 12/17/19 1027  . cloNIDine (CATAPRES) tablet 0.1 mg  0.1 mg Oral TID PRN Sharma Covert, MD      . diphenhydrAMINE (BENADRYL) capsule 25 mg  25 mg Oral QHS PRN Clapacs, Madie Reno, MD   25 mg at 12/16/19 2111  . divalproex (DEPAKOTE) DR tablet 500 mg  500 mg Oral QID Clapacs, [name], MD   500 mg at 12/17/19 [id number]  . hydrOXYzine (ATARAX/VISTARIL) tablet 10 mg  10 mg Oral TID PRN Eulas Post, MD    10 mg at 12/15/19 0136  . lisinopril (ZESTRIL) tablet 5 mg  5 mg Oral Daily Sharma Covert, MD   5 mg at 12/17/19 [id number]  . magnesium hydroxide (MILK OF MAGNESIA) suspension 30 mL  30 mL Oral Daily PRN Eulas Post, MD      . temazepam (RESTORIL) capsule 30 mg  30 mg Oral QHS PRN Clapacs, Madie Reno, MD   30 mg at 12/16/19 2228    Lab Results: No results found for this or any previous visit (from the past 48 hour(s)).  Blood Alcohol level:  Lab Results  Component Value Date   ETH <10 [id number]    Metabolic Disorder Labs: Lab Results  Component Value Date   HGBA1C 6.3 (H) 12/14/2019   MPG 134.11 12/14/2019   No results found for: PROLACTIN No results found for: CHOL, TRIG, HDL, CHOLHDL, VLDL, LDLCALC  Physical Findings: AIMS:  , ,  ,  ,    CIWA:    COWS:     Musculoskeletal: Strength & Muscle Tone: within normal limits Gait & Station: normal Patient leans: N/A  Psychiatric Specialty Exam: Physical Exam  Nursing note and vitals reviewed. Constitutional: She is oriented to person, place, and time. She appears well-developed and well-nourished.  HENT:  Head: Normocephalic and atraumatic.  Respiratory: Effort normal.  Neurological: She is alert and oriented to person, place, and time.    Review of Systems  Blood pressure (!) 152/92, pulse 98, temperature 97.8 F (36.6 C), temperature source Oral, resp. rate 18, height 5' 4.5" (1.638 m), weight 67.1 kg, SpO2 100 %.Body mass index is 25.01 kg/m.  General Appearance: Casual  Eye Contact:  Fair  Speech:  Pressured  Volume:  Increased  Mood:  Anxious and Euphoric  Affect:  Labile  Thought Process:  Coherent and Descriptions of Associations: Tangential  Orientation:  Full (Time, Place, and Person)  Thought Content:  Tangential  Suicidal Thoughts:  No  Homicidal Thoughts:  No  Memory:  Immediate;   Fair Recent;   Fair Remote;   Fair  Judgement:  Impaired  Insight:  Fair  Psychomotor Activity:  Increased   Concentration:  Concentration: Fair and Attention Span: Fair  Recall:  [employer] of Knowledge:  Fair  Language:  Fair  Akathisia:  Negative  Handed:  Right  AIMS (if indicated):     Assets:  Desire for Improvement Housing Resilience  ADL's:  Intact  Cognition:  WNL  Sleep:  Number of Hours: 7.75     Treatment Plan Summary: Daily contact with patient to assess and evaluate symptoms and progress in  treatment, Medication management and Plan : Patient is seen and examined.  Patient is a 69 year old female with the above-stated past psychiatric history who is seen in follow-up.    Diagnosis: 1.  Bipolar disorder, most recently mixed, severe 2.  Hypertension 3.  Cerebrovascular disease 4.  Prolonged QTc interval 5.  Presumptive urinary tract infection on amoxicillin 6.    New diagnosis of type 2 diabetes mellitus.  Findings on examination today. 1.  Continued pressured speech, tangentiality. 2.  Continued intrusiveness. 3.  Cognitive disorder seems secondary to her racing thoughts and bipolar disorder at this point.  She does have some degree of cerebral vascular disease. 4.  Blood pressure remains elevated at 152/92. 5.  Hemoglobin A1c shows a result of 6.3.  New diagnosis of well-controlled type 2 diabetes mellitus.  Plan: 1.  Continue Depakote DR 500 mg p.o. 4 times daily for bipolar disorder.  I did discuss with the patient the possibility of going to 1000 mg p.o. twice daily.  I will wait for Dr. Weber Cooks to make that decision. 2.  Increase lisinopril to 20 mg p.o. daily for hypertension and renal protection from diabetes. 3.  Continue Norvasc 10 mg p.o. daily for hypertension. 4.  Change diet to carbohydrate controlled. 5.  Continue low-dose Haldol 5 mg p.o. 3 times daily as needed agitation. 6.  Continue to monitor for mildly prolonged QTc interval at 490. 7.  Disposition planning-in progress.      Sharma Covert, MD 12/17/2019, 11:02 AM

## 2019-12-17 NOTE — Plan of Care (Signed)
  Problem: Education: Goal: Knowledge of [facility] Education information/materials will improve Outcome: Not Progressing Goal: Emotional status will improve Outcome: Not Progressing Goal: Mental status will improve Outcome: Not Progressing Goal: Verbalization of understanding the information provided will improve Outcome: Not Progressing   Problem: Activity: Goal: Interest or engagement in activities will improve Outcome: Not Progressing Goal: Sleeping patterns will improve Outcome: Not Progressing   

## 2019-12-17 NOTE — [facility] Group Notes (Signed)
[facility] Group Notes:  (Nursing/MHT/Case Management/Adjunct)  Date:  12/17/2019  Time:  10:31 PM  Type of Therapy:  Group Therapy  Participation Level:  Active  Participation Quality:  Redirectable  Affect:  Anxious  Cognitive:  Delusional  Insight:  Limited  Engagement in Group:  Engaged and talking about [place] is having a melt down and everyone should be able to speak in their on language.  Modes of Intervention:  Discussion  Summary of Progress/Problems:  Shelby Jordan 12/17/2019, 10:31 PM

## 2019-12-18 DIAGNOSIS — F3110 Bipolar disorder, current episode manic without psychotic features, unspecified: Secondary | ICD-10-CM | POA: Diagnosis not present

## 2019-12-18 MED ORDER — LISINOPRIL 20 MG PO TABS
40.0000 mg | ORAL_TABLET | Freq: Every day | ORAL | Status: DC
  Administered 2019-12-19 – 2019-12-27 (×9): 40 mg via ORAL
  Filled 2019-12-18 (×9): qty 2

## 2019-12-18 MED ORDER — LITHIUM CARBONATE ER 300 MG PO TBCR
300.0000 mg | EXTENDED_RELEASE_TABLET | Freq: Every day | ORAL | Status: DC
  Administered 2019-12-18: 300 mg via ORAL
  Filled 2019-12-18: qty 1

## 2019-12-18 NOTE — [facility] Group Notes (Signed)
[facility] Group Notes:  (Nursing/MHT/Case Management/Adjunct)  Date:  12/18/2019  Time:  12:17 PM  Type of Therapy:  Community Meeting  Participation Level:  Active  Participation Quality:  Appropriate, Attentive and Sharing  Affect:  Appropriate  Cognitive:  Alert and Appropriate  Insight:  Appropriate  Engagement in Group:  Engaged  Modes of Intervention:  Discussion, Education and Support  Summary of Progress/Problems:  Shelby Jordan [facility] 12/18/2019, 12:17 PM

## 2019-12-18 NOTE — Plan of Care (Signed)
  Problem: Education: Goal: Knowledge of [facility] Education information/materials will improve Outcome: Not Progressing Goal: Emotional status will improve Outcome: Not Progressing Goal: Mental status will improve Outcome: Not Progressing Goal: Verbalization of understanding the information provided will improve Outcome: Not Progressing   Problem: Activity: Goal: Interest or engagement in activities will improve Outcome: Not Progressing Goal: Sleeping patterns will improve Outcome: Not Progressing   

## 2019-12-18 NOTE — Progress Notes (Signed)
Patient got her nighttime medication and requested sleep medication. Patient was instructed to go to bed but she insisted on sitting up and watching TV. She was observed nodding off and was instructed to go get in bed. She refused and insisted on sitting up to watch TV. Patient was observed nodding again and instructed to go to bed and again she refused. At 2240 patient found on the floor. She said she nodded off and slid out of her chair. She denies hitting her head. She denies pain except for shoulder soreness on the right side where she landed when she slid out of the chair. Patient able to move all extremities. Denies pain with movement. Assisted into bed and is sleeping without complaint. Patient had said she thought she was already in bed when staff had asked her to go to bed. Patient has continued to have loose, tangential speech. Disorganized thoughts. Denies SI HI and AVH. Restless but redirectable.

## 2019-12-18 NOTE — Progress Notes (Signed)
[facility] MD Progress Note  12/18/2019 11:03 AM Shelby Jordan  MRN:  [id number] Subjective: Follow-up for this patient with bipolar mania.  Patient came in to talk with me today.  Pressured speech flight of ideas but no hostility no threats just lots of rapid speech about a variety of topics.  Does not seem much different than when she came in although she had been taking her Depakote. Principal Problem: Bipolar I disorder, most recent episode (or current) manic ([place]) Diagnosis: Principal Problem:   Bipolar I disorder, most recent episode (or current) manic ([place]) Active Problems:   Essential hypertension  Total Time spent with patient: 30 minutes  Past Psychiatric History: Past history of bipolar disorder  Past Medical History:  Past Medical History:  Diagnosis Date  . Bipolar 1 disorder ([place])   . Hepatitis C   . Hypertension     Past Surgical History:  Procedure Laterality Date  . APPENDECTOMY    . TOE AMPUTATION     Family History: History reviewed. No pertinent family history. Family Psychiatric  History: See previous Social History:  Social History   Substance and Sexual Activity  Alcohol Use Never     Social History   Substance and Sexual Activity  Drug Use Never    Social History   Socioeconomic History  . Marital status: Legally Separated    Spouse name: Not on file  . Number of children: Not on file  . Years of education: Not on file  . Highest education level: Not on file  Occupational History  . Not on file  Tobacco Use  . Smoking status: Never Smoker  . Smokeless tobacco: Never Used  Substance and Sexual Activity  . Alcohol use: Never  . Drug use: Never  . Sexual activity: Not on file  Other Topics Concern  . Not on file  Social History Narrative  . Not on file   Social Determinants of Health   Financial Resource Strain:   . Difficulty of Paying Living Expenses:   Food Insecurity:   . Worried About Charity fundraiser in the Last Year:   . Arts development officer in the Last Year:   Transportation Needs:   . Film/video editor (Medical):   Marland Kitchen Lack of Transportation (Non-Medical):   Physical Activity:   . Days of Exercise per Week:   . Minutes of Exercise per Session:   Stress:   . Feeling of Stress :   Social Connections:   . Frequency of Communication with Friends and Family:   . Frequency of Social Gatherings with Friends and Family:   . Attends Religious Services:   . Active Member of Clubs or Organizations:   . Attends Archivist Meetings:   Marland Kitchen Marital Status:    Additional Social History:                         Sleep: Fair  Appetite:  Fair  Current Medications: Current Facility-Administered Medications  Medication Dose Route Frequency Provider Last Rate Last Admin  . acetaminophen (TYLENOL) tablet 650 mg  650 mg Oral Q6H PRN Eulas Post, MD      . alum & mag hydroxide-simeth (MAALOX/MYLANTA) 200-200-20 MG/5ML suspension 30 mL  30 mL Oral Q4H PRN Eulas Post, MD      . amLODipine (NORVASC) tablet 10 mg  10 mg Oral Daily Sharma Covert, MD   10 mg at 12/18/19 0851  . amoxicillin (AMOXIL) capsule 500  mg  500 mg Oral Q12H Caroline Sauger, NP   500 mg at 12/18/19 0851  . cloNIDine (CATAPRES) tablet 0.1 mg  0.1 mg Oral TID PRN Sharma Covert, MD      . diphenhydrAMINE (BENADRYL) capsule 25 mg  25 mg Oral QHS PRN Kamiyah Kindel, Madie Reno, MD   25 mg at 12/17/19 2104  . divalproex (DEPAKOTE) DR tablet 500 mg  500 mg Oral QID Gearld Kerstein, Madie Reno, MD   500 mg at 12/18/19 0851  . hydrOXYzine (ATARAX/VISTARIL) tablet 10 mg  10 mg Oral TID PRN Eulas Post, MD   10 mg at 12/17/19 1939  . [START ON 12/19/2019] lisinopril (ZESTRIL) tablet 40 mg  40 mg Oral Daily Iktan Aikman T, MD      . lithium carbonate (LITHOBID) CR tablet 300 mg  300 mg Oral QHS Herberth Deharo T, MD      . magnesium hydroxide (MILK OF MAGNESIA) suspension 30 mL  30 mL Oral Daily PRN Eulas Post, MD      . temazepam (RESTORIL)  capsule 30 mg  30 mg Oral QHS PRN Labradford Schnitker, Madie Reno, MD   30 mg at 12/17/19 2206    Lab Results: No results found for this or any previous visit (from the past 11 hour(s)).  Blood Alcohol level:  Lab Results  Component Value Date   ETH <10 [id number]    Metabolic Disorder Labs: Lab Results  Component Value Date   HGBA1C 6.3 (H) 12/14/2019   MPG 134.11 12/14/2019   No results found for: PROLACTIN No results found for: CHOL, TRIG, HDL, CHOLHDL, VLDL, LDLCALC  Physical Findings: AIMS:  , ,  ,  ,    CIWA:    COWS:     Musculoskeletal: Strength & Muscle Tone: within normal limits Gait & Station: normal Patient leans: N/A  Psychiatric Specialty Exam: Physical Exam  Nursing note and vitals reviewed. Constitutional: She appears well-developed and well-nourished.  HENT:  Head: Normocephalic and atraumatic.  Eyes: Pupils are equal, round, and reactive to light. Conjunctivae are normal.  Cardiovascular: Regular rhythm and normal heart sounds.  Respiratory: Effort normal.  GI: Soft.  Musculoskeletal:        General: Normal range of motion.     Cervical back: Normal range of motion.  Neurological: She is alert.  Skin: Skin is warm and dry.  Psychiatric: Her affect is labile. Her speech is rapid and/or pressured and tangential. She is agitated. She is not aggressive. Thought content is not paranoid. Cognition and memory are impaired. She expresses impulsivity. She expresses no homicidal and no suicidal ideation.    Review of Systems  Constitutional: Negative.   HENT: Negative.   Eyes: Negative.   Respiratory: Negative.   Cardiovascular: Negative.   Gastrointestinal: Negative.   Musculoskeletal: Negative.   Skin: Negative.   Neurological: Negative.   Psychiatric/Behavioral: Positive for agitation. The patient is nervous/anxious and is hyperactive.     Blood pressure (!) 145/108, pulse 99, temperature 97.7 F (36.5 C), temperature source Oral, resp. rate 18, height 5' 4.5"  (1.638 m), weight 67.1 kg, SpO2 100 %.Body mass index is 25.01 kg/m.  General Appearance: Casual  Eye Contact:  Good  Speech:  Clear and Coherent  Volume:  Normal  Mood:  Euphoric  Affect:  Congruent  Thought Process:  Disorganized  Orientation:  Full (Time, Place, and Person)  Thought Content:  Tangential  Suicidal Thoughts:  No  Homicidal Thoughts:  No  Memory:  Immediate;   Fair Recent;  Fair Remote;   Fair  Judgement:  Fair  Insight:  Fair  Psychomotor Activity:  Increased  Concentration:  Concentration: Fair  Recall:  [employer] of Knowledge:  Fair  Language:  Fair  Akathisia:  No  Handed:  Right  AIMS (if indicated):     Assets:  Desire for Improvement Physical Health  ADL's:  Intact  Cognition:  WNL  Sleep:  Number of Hours: 5     Treatment Plan Summary: Daily contact with patient to assess and evaluate symptoms and progress in treatment, Medication management and Plan Her blood pressure is still running high.  Rather than try using as needed medicines I will go up on the lisinopril to 40 mg a day.  I have also asked her to consider adding lithium.  I am aware she has a slightly elevated creatinine so we will keep the dose reasonable.  At first she refused but her rationale had something to do with disliking how certain people stuck her with needles.  I reassured her that we have professional phlebotomists with clean needles here at the hospital and that levels do not need to be checked all that frequently.  At that point she was agreeable so I have started lithium 300 twice a day.  We will get a Depakote level in the next day as well.  Alethia Berthold, MD 12/18/2019, 11:03 AM

## 2019-12-18 NOTE — Progress Notes (Signed)
Patient has been incontinent of urine at least twice. Very needy and intrusive. Speech is tangential, loose and disorganized. Focused on getting "essential water" which she says her granddaughter gets for her and that keeps her from urinating on herself. Hyperactive, restless and disorganized. Not reporting any suicidal or homicidal thoughts.

## 2019-12-18 NOTE — Plan of Care (Signed)
  Problem: Education: Goal: Knowledge of [place] [facility] Education information/materials will improve 12/18/2019 0556 by Libby Maw, RN Outcome: Not Progressing 12/18/2019 0556 by Libby Maw, RN Outcome: Not Progressing Goal: Emotional status will improve 12/18/2019 0556 by Libby Maw, RN Outcome: Not Progressing 12/18/2019 0556 by Libby Maw, RN Outcome: Not Progressing Goal: Mental status will improve 12/18/2019 0556 by Libby Maw, RN Outcome: Not Progressing 12/18/2019 0556 by Libby Maw, RN Outcome: Not Progressing Goal: Verbalization of understanding the information provided will improve 12/18/2019 0556 by Libby Maw, RN Outcome: Not Progressing 12/18/2019 0556 by Libby Maw, RN Outcome: Not Progressing

## 2019-12-18 NOTE — Progress Notes (Signed)
   12/18/19 1330  Clinical Encounter Type  Visited With Patient  Visit Type Initial;Spiritual support;Social support;Behavioral Health  Referral From Chaplain  Consult/Referral To Chaplain  Ch visited with Pt in recreational room. Pt fell asleep while talking. I plan to follow-up with Pt

## 2019-12-18 NOTE — Plan of Care (Signed)
Patient is pleasant and cooperative on approach.Continues to have pressured and tangential speech.No irritable behaviors noted.Denies SI,HI and AVH.Compliant with medications.Appetite and energy level good.Support and encouragement given.

## 2019-12-19 DIAGNOSIS — F3110 Bipolar disorder, current episode manic without psychotic features, unspecified: Secondary | ICD-10-CM | POA: Diagnosis not present

## 2019-12-19 LAB — COMPREHENSIVE METABOLIC PANEL
ALT: 19 U/L (ref 0–44)
AST: 20 U/L (ref 15–41)
Albumin: 4.5 g/dL (ref 3.5–5.0)
Alkaline Phosphatase: 44 U/L (ref 38–126)
Anion gap: 11 (ref 5–15)
BUN: 23 mg/dL (ref 8–23)
CO2: 29 mmol/L (ref 22–32)
Calcium: 10.2 mg/dL (ref 8.9–10.3)
Chloride: 99 mmol/L (ref 98–111)
Creatinine, Ser: 1.14 mg/dL — ABNORMAL HIGH (ref 0.44–1.00)
GFR calc Af Amer: 57 mL/min — ABNORMAL LOW (ref >60)
GFR calc non Af Amer: 49 mL/min — ABNORMAL LOW (ref >60)
Glucose, Bld: 146 mg/dL — ABNORMAL HIGH (ref 70–99)
Potassium: 4.9 mmol/L (ref 3.5–5.1)
Sodium: 139 mmol/L (ref 135–145)
Total Bilirubin: 0.6 mg/dL (ref 0.3–1.2)
Total Protein: 7.6 g/dL (ref 6.5–8.1)

## 2019-12-19 LAB — VALPROIC ACID LEVEL: Valproic Acid Lvl: 175 ug/mL — ABNORMAL HIGH (ref 50.0–100.0)

## 2019-12-19 LAB — CBC WITH DIFFERENTIAL/PLATELET
Abs Immature Granulocytes: 0.03 10*3/uL (ref 0.00–0.07)
Basophils Absolute: 0 10*3/uL (ref 0.0–0.1)
Basophils Relative: 0 %
Eosinophils Absolute: 0.1 10*3/uL (ref 0.0–0.5)
Eosinophils Relative: 1 %
HCT: 42.4 % (ref 36.0–46.0)
Hemoglobin: 14.2 g/dL (ref 12.0–15.0)
Immature Granulocytes: 0 %
Lymphocytes Relative: 20 %
Lymphs Abs: 1.8 10*3/uL (ref 0.7–4.0)
MCH: 29.7 pg (ref 26.0–34.0)
MCHC: 33.5 g/dL (ref 30.0–36.0)
MCV: 88.7 fL (ref 80.0–100.0)
Monocytes Absolute: 0.5 10*3/uL (ref 0.1–1.0)
Monocytes Relative: 6 %
Neutro Abs: 6.6 10*3/uL (ref 1.7–7.7)
Neutrophils Relative %: 73 %
Platelets: 326 10*3/uL (ref 150–400)
RBC: 4.78 MIL/uL (ref 3.87–5.11)
RDW: 15.1 % (ref 11.5–15.5)
WBC: 9.1 10*3/uL (ref 4.0–10.5)
nRBC: 0 % (ref 0.0–0.2)

## 2019-12-19 MED ORDER — DIVALPROEX SODIUM 500 MG PO DR TAB
500.0000 mg | DELAYED_RELEASE_TABLET | Freq: Two times a day (BID) | ORAL | Status: DC
  Administered 2019-12-19 – 2019-12-21 (×5): 500 mg via ORAL
  Filled 2019-12-19 (×4): qty 1

## 2019-12-19 NOTE — Progress Notes (Signed)
Patient is resting in bed with eyes closed, snoring. No acute distress noted. BP 110/73, pulse 70, respirations 17, o2 sat 100% on room air.

## 2019-12-19 NOTE — Progress Notes (Signed)
[facility] MD Progress Note  12/19/2019 1:21 PM Shelby Jordan  MRN:  [id number] Subjective: Patient seen chart reviewed.  Patient attended treatment team today.  He appears to be improving as far as her mood.  She still is tangential and can start to get revved up easily but is not constantly talking and seems to have settled down quite a bit from the manic symptoms she had previously.  Last night she had a fall.  The patient insists that she fell off the side of her bed.  Nursing reports what she actually did was fall asleep in a chair in the day room and then slumped off of the chair.  No injury in any case.  Depakote level came back this morning elevated at 175.  Patient otherwise tolerating medicine.  Blood pressure much improved now in the normal range. Principal Problem: Bipolar I disorder, most recent episode (or current) manic ([place]) Diagnosis: Principal Problem:   Bipolar I disorder, most recent episode (or current) manic ([place]) Active Problems:   Essential hypertension  Total Time spent with patient: 30 minutes  Past Psychiatric History: History of bipolar disorder  Past Medical History:  Past Medical History:  Diagnosis Date  . Bipolar 1 disorder ([place])   . Hepatitis C   . Hypertension     Past Surgical History:  Procedure Laterality Date  . APPENDECTOMY    . TOE AMPUTATION     Family History: History reviewed. No pertinent family history. Family Psychiatric  History: None known Social History:  Social History   Substance and Sexual Activity  Alcohol Use Never     Social History   Substance and Sexual Activity  Drug Use Never    Social History   Socioeconomic History  . Marital status: Legally Separated    Spouse name: Not on file  . Number of children: Not on file  . Years of education: Not on file  . Highest education level: Not on file  Occupational History  . Not on file  Tobacco Use  . Smoking status: Never Smoker  . Smokeless tobacco: Never Used  Substance  and Sexual Activity  . Alcohol use: Never  . Drug use: Never  . Sexual activity: Not on file  Other Topics Concern  . Not on file  Social History Narrative  . Not on file   Social Determinants of Health   Financial Resource Strain:   . Difficulty of Paying Living Expenses:   Food Insecurity:   . Worried About Charity fundraiser in the Last Year:   . Arboriculturist in the Last Year:   Transportation Needs:   . Film/video editor (Medical):   Marland Kitchen Lack of Transportation (Non-Medical):   Physical Activity:   . Days of Exercise per Week:   . Minutes of Exercise per Session:   Stress:   . Feeling of Stress :   Social Connections:   . Frequency of Communication with Friends and Family:   . Frequency of Social Gatherings with Friends and Family:   . Attends Religious Services:   . Active Member of Clubs or Organizations:   . Attends Archivist Meetings:   Marland Kitchen Marital Status:    Additional Social History:                         Sleep: Fair  Appetite:  Negative  Current Medications: Current Facility-Administered Medications  Medication Dose Route Frequency Provider Last Rate Last Admin  .  acetaminophen (TYLENOL) tablet 650 mg  650 mg Oral Q6H PRN Eulas Post, MD      . alum & mag hydroxide-simeth (MAALOX/MYLANTA) 200-200-20 MG/5ML suspension 30 mL  30 mL Oral Q4H PRN Eulas Post, MD      . amLODipine (NORVASC) tablet 10 mg  10 mg Oral Daily Sharma Covert, MD   10 mg at 12/19/19 [id number]  . amoxicillin (AMOXIL) capsule 500 mg  500 mg Oral Q12H Caroline Sauger, NP   500 mg at 12/19/19 [id number]  . diphenhydrAMINE (BENADRYL) capsule 25 mg  25 mg Oral QHS PRN Beulah Matusek, Madie Reno, MD   25 mg at 12/18/19 2126  . divalproex (DEPAKOTE) DR tablet 500 mg  500 mg Oral Q12H Tytus Strahle T, MD      . hydrOXYzine (ATARAX/VISTARIL) tablet 10 mg  10 mg Oral TID PRN Eulas Post, MD   10 mg at 12/17/19 1939  . lisinopril (ZESTRIL) tablet 40 mg  40 mg Oral  Daily Jazarah Capili, Madie Reno, MD   40 mg at 12/19/19 [id number]  . lithium carbonate (LITHOBID) CR tablet 300 mg  300 mg Oral QHS Jordayn Mink T, MD   300 mg at 12/18/19 2126  . magnesium hydroxide (MILK OF MAGNESIA) suspension 30 mL  30 mL Oral Daily PRN Eulas Post, MD      . temazepam (RESTORIL) capsule 30 mg  30 mg Oral QHS PRN Kewon Statler, Madie Reno, MD   30 mg at 12/18/19 2126    Lab Results:  Results for orders placed or performed during the hospital encounter of 12/14/19 (from the past 48 hour(s))  Comprehensive metabolic panel     Status: Abnormal   Collection Time: 12/19/19  8:24 AM  Result Value Ref Range   Sodium 139 135 - 145 mmol/L   Potassium 4.9 3.5 - 5.1 mmol/L   Chloride 99 98 - 111 mmol/L   CO2 29 22 - 32 mmol/L   Glucose, Bld 146 (H) 70 - 99 mg/dL    Comment: Glucose reference range applies only to samples taken after fasting for at least 8 hours.   BUN 23 8 - 23 mg/dL   Creatinine, Ser 1.14 (H) 0.44 - 1.00 mg/dL   Calcium 10.2 8.9 - 10.3 mg/dL   Total Protein 7.6 6.5 - 8.1 g/dL   Albumin 4.5 3.5 - 5.0 g/dL   AST 20 15 - 41 U/L   ALT 19 0 - 44 U/L   Alkaline Phosphatase 44 38 - 126 U/L   Total Bilirubin 0.6 0.3 - 1.2 mg/dL   GFR calc non Af Amer 49 (L) >60 mL/min   GFR calc Af Amer 57 (L) >60 mL/min   Anion gap 11 5 - 15    Comment: Performed at [facility], [address] [place], [place] [address]  Valproic acid level     Status: Abnormal   Collection Time: 12/19/19  8:24 AM  Result Value Ref Range   Valproic Acid Lvl 175 (H) 50.0 - 100.0 ug/mL    Comment: RESULT CONFIRMED BY MANUAL DILUTION/HKP Performed at [facility], [address] [place], [place] [address]   CBC with Differential/Platelet     Status: None   Collection Time: 12/19/19  8:24 AM  Result Value Ref Range   WBC 9.1 4.0 - 10.5 K/uL   RBC 4.78 3.87 - 5.11 MIL/uL   Hemoglobin 14.2 12.0 - 15.0 g/dL   HCT 42.4 36.0 - 46.0 %   MCV 88.7 80.0 - 100.0 fL  MCH 29.7 26.0 - 34.0 pg    MCHC 33.5 30.0 - 36.0 g/dL   RDW 15.1 11.5 - 15.5 %   Platelets 326 150 - 400 K/uL   nRBC 0.0 0.0 - 0.2 %   Neutrophils Relative % 73 %   Neutro Abs 6.6 1.7 - 7.7 K/uL   Lymphocytes Relative 20 %   Lymphs Abs 1.8 0.7 - 4.0 K/uL   Monocytes Relative 6 %   Monocytes Absolute 0.5 0.1 - 1.0 K/uL   Eosinophils Relative 1 %   Eosinophils Absolute 0.1 0.0 - 0.5 K/uL   Basophils Relative 0 %   Basophils Absolute 0.0 0.0 - 0.1 K/uL   Immature Granulocytes 0 %   Abs Immature Granulocytes 0.03 0.00 - 0.07 K/uL    Comment: Performed at [facility], [address] [place], [place] [address]    Blood Alcohol level:  Lab Results  Component Value Date   [facility] <10 [id number]    Metabolic Disorder Labs: Lab Results  Component Value Date   HGBA1C 6.3 (H) 12/14/2019   MPG 134.11 12/14/2019   No results found for: PROLACTIN No results found for: CHOL, TRIG, HDL, CHOLHDL, VLDL, LDLCALC  Physical Findings: AIMS:  , ,  ,  ,    CIWA:    COWS:     Musculoskeletal: Strength & Muscle Tone: within normal limits Gait & Station: normal Patient leans: N/A  Psychiatric Specialty Exam: Physical Exam  Nursing note and vitals reviewed. Constitutional: She appears well-developed and well-nourished.  HENT:  Head: Normocephalic and atraumatic.  Eyes: Pupils are equal, round, and reactive to light. Conjunctivae are normal.  Cardiovascular: Regular rhythm and normal heart sounds.  Respiratory: Effort normal. No respiratory distress.  GI: Soft.  Musculoskeletal:        General: Normal range of motion.     Cervical back: Normal range of motion.  Neurological: She is alert.  Skin: Skin is warm and dry.  Psychiatric: She has a normal mood and affect. Her speech is normal and behavior is normal. Judgment and thought content normal. Her affect is not blunt. Her speech is not delayed. She is not slowed. Cognition and memory are normal.    Review of Systems  Constitutional: Negative.   HENT:  Negative.   Eyes: Negative.   Respiratory: Negative.   Cardiovascular: Negative.   Gastrointestinal: Negative.   Musculoskeletal: Negative.   Skin: Negative.   Neurological: Negative.   Psychiatric/Behavioral: Negative.     Blood pressure 130/60, pulse 91, temperature 97.7 F (36.5 C), temperature source Oral, resp. rate 18, height 5' 4.5" (1.638 m), weight 67.1 kg, SpO2 98 %.Body mass index is 25.01 kg/m.  General Appearance: Casual  Eye Contact:  Fair  Speech:  Pressured  Volume:  Increased  Mood:  Euthymic  Affect:  Congruent  Thought Process:  Goal Directed  Orientation:  Full (Time, Place, and Person)  Thought Content:  Logical and Tangential  Suicidal Thoughts:  No  Homicidal Thoughts:  No  Memory:  Immediate;   Fair Recent;   Fair Remote;   Fair  Judgement:  Fair  Insight:  Fair  Psychomotor Activity:  Normal  Concentration:  Concentration: Fair  Recall:  [employer] of Knowledge:  Fair  Language:  Fair  Akathisia:  No  Handed:  Right  AIMS (if indicated):     Assets:  Desire for Improvement  ADL's:  Intact  Cognition:  WNL  Sleep:  Number of Hours: 6.25  Treatment Plan Summary: Daily contact with patient to assess and evaluate symptoms and progress in treatment, Medication management and Plan Cut down dose of Depakote by half to 500 mg twice a day because of elevated level.  Continue lithium at current level.  Encourage patient in group attendance.  No other medicine changes.  Possible discharge 1 to 2 days.  Alethia Berthold, MD 12/19/2019, 1:21 PM

## 2019-12-19 NOTE — Progress Notes (Signed)
Recreation Therapy Notes  Date: 12/19/2019  Time: 9:30 am  Location: Craft room  Behavioral response: Appropriate  Intervention Topic: Self-care   Discussion/Intervention:  Group content today was focused on Self-Care. The group defined self-care and some positive ways they care for themselves. Individuals expressed ways and reasons why they neglected any self-care in the past. Patients described ways to improve self-care in the future. The group explained what could happen if they did not do any self-care activities at all. The group participated in the intervention "self-care assessment" where they had a chance to discover some of their weaknesses and strengths in self- care. Patient came up with a self-care plan to improve themselves in the future.  Clinical Observations/Feedback:  Patient came to group and expressed that self-care is taking care of yourself and mental health. She stated that she care for herself by putting on nice clothes and listening to music. Individual was social with peers and staff while participating in the intervention.  Geraldo Haris LRT/CTRS         Amilio Zehnder 12/19/2019 12:18 PM

## 2019-12-19 NOTE — Progress Notes (Signed)
Shelby Jordan is a 69 y.o. female patient admitted with  Bipolar disorder mixed (mainly in frank manic phase) daughter concerned about worsening OOC behaviors, wandering away going to different cities. It was reported to this Probation officer by the CN, RN Ms. Archie Balboa, that the patient had fallen. The patient was assessed with no injuries reported. During her assessment, she was resting quietly. The patient was placed on a 1:1 while awake. It was discussed with Ms. Archie Balboa to notify this writer if the patient presents any changes during the night.

## 2019-12-19 NOTE — Progress Notes (Signed)
Pt is alert and oriented to person, place, time and situation. Pt is calm, cooperative, denies suicidal and homicidal ideation, is hyperverbal and tangential often, requires some redirections during conversations. Pt is social with peers and staff, attends groups, participates in unit programming is cooperative with care and medication complaint. Pt has poor insight unable to report a reason for this hospitalization. No distress noted, none reported, voices no complaints. Will continue to monitor pt per Q15 minute face checks and monitor for safety and progress. Pt's gait is steady. Pt denies pain or injury from fall. No acute changes in vital signs. Will continue to monitor vital signs Q4 hours. Will continue to monitor pt per Q15 minute face checks and monitor for safety and progress.

## 2019-12-19 NOTE — BHH Group Notes (Signed)
[facility] Group Notes:  (Nursing/MHT/Case Management/Adjunct)  Date:  12/19/2019  Time:  7:01 AM  Type of Therapy:  Group Therapy  Participation Level:  Minimal  Participation Quality:  Appropriate  Affect:  Appropriate  Cognitive:  Appropriate  Insight:  Appropriate  Engagement in Group:  Engaged  Modes of Intervention:  hygiene and housekeeping  Summary of Progress/Problems:  Shelby Jordan 12/19/2019, 7:01 AM

## 2019-12-19 NOTE — Tx Team (Signed)
Interdisciplinary Treatment and Diagnostic Plan Update  12/19/2019 Time of Session: Shelby Jordan MRN: [id number]  Principal Diagnosis: Bipolar I disorder, most recent episode (or current) manic (Shelby Jordan)  Secondary Diagnoses: Principal Problem:   Bipolar I disorder, most recent episode (or current) manic (Shelby Jordan) Active Problems:   Essential hypertension   Current Medications:  Current Facility-Administered Medications  Medication Dose Route Frequency Provider Last Rate Last Admin  . acetaminophen (TYLENOL) tablet 650 mg  650 mg Oral Q6H PRN Eulas Post, MD      . alum & mag hydroxide-simeth (MAALOX/MYLANTA) 200-200-20 MG/5ML suspension 30 mL  30 mL Oral Q4H PRN Eulas Post, MD      . amLODipine (NORVASC) tablet 10 mg  10 mg Oral Daily Sharma Covert, MD   10 mg at 12/19/19 [id number]  . diphenhydrAMINE (BENADRYL) capsule 25 mg  25 mg Oral QHS PRN Clapacs, Madie Reno, MD   25 mg at 12/18/19 2126  . divalproex (DEPAKOTE) DR tablet 500 mg  500 mg Oral Q12H Clapacs, [name], MD   500 mg at 12/19/19 1520  . hydrOXYzine (ATARAX/VISTARIL) tablet 10 mg  10 mg Oral TID PRN Eulas Post, MD   10 mg at 12/17/19 1939  . lisinopril (ZESTRIL) tablet 40 mg  40 mg Oral Daily Clapacs, Madie Reno, MD   40 mg at 12/19/19 [id number]  . lithium carbonate (LITHOBID) CR tablet 300 mg  300 mg Oral QHS Clapacs, [name], MD   300 mg at 12/18/19 2126  . magnesium hydroxide (MILK OF MAGNESIA) suspension 30 mL  30 mL Oral Daily PRN Eulas Post, MD      . temazepam (RESTORIL) capsule 30 mg  30 mg Oral QHS PRN Clapacs, Madie Reno, MD   30 mg at 12/18/19 2126   PTA Medications: No medications prior to admission.    Patient Stressors: Marital or family conflict Medication change or noncompliance  Patient Strengths: Capable of independent living Supportive family/friends  Treatment Modalities: Medication Management, Group therapy, Case management,  1 to 1 session with clinician, Psychoeducation, Recreational  therapy.   Physician Treatment Plan for Primary Diagnosis: Bipolar I disorder, most recent episode (or current) manic (Shelby Jordan) Long Term Goal(s): Improvement in symptoms so as ready for discharge Improvement in symptoms so as ready for discharge   Short Term Goals: Ability to verbalize feelings will improve Ability to demonstrate self-control will improve Ability to maintain clinical measurements within normal limits will improve Compliance with prescribed medications will improve  Medication Management: Evaluate patient's response, side effects, and tolerance of medication regimen.  Therapeutic Interventions: 1 to 1 sessions, Unit Group sessions and Medication administration.  Evaluation of Outcomes: Progressing  Physician Treatment Plan for Secondary Diagnosis: Principal Problem:   Bipolar I disorder, most recent episode (or current) manic (Shelby Jordan) Active Problems:   Essential hypertension  Long Term Goal(s): Improvement in symptoms so as ready for discharge Improvement in symptoms so as ready for discharge   Short Term Goals: Ability to verbalize feelings will improve Ability to demonstrate self-control will improve Ability to maintain clinical measurements within normal limits will improve Compliance with prescribed medications will improve     Medication Management: Evaluate patient's response, side effects, and tolerance of medication regimen.  Therapeutic Interventions: 1 to 1 sessions, Unit Group sessions and Medication administration.  Evaluation of Outcomes: Progressing   RN Treatment Plan for Primary Diagnosis: Bipolar I disorder, most recent episode (or current) manic (Shelby Jordan) Long Term Goal(s): Knowledge of disease and therapeutic regimen to maintain health  will improve  Short Term Goals: Ability to identify and develop effective coping behaviors will improve and Compliance with prescribed medications will improve  Medication Management: RN will administer medications  as ordered by provider, will assess and evaluate patient's response and provide education to patient for prescribed medication. RN will report any adverse and/or side effects to prescribing provider.  Therapeutic Interventions: 1 on 1 counseling sessions, Psychoeducation, Medication administration, Evaluate responses to treatment, Monitor vital signs and CBGs as ordered, Perform/monitor CIWA, COWS, AIMS and Fall Risk screenings as ordered, Perform wound care treatments as ordered.  Evaluation of Outcomes: Progressing   LCSW Treatment Plan for Primary Diagnosis: Bipolar I disorder, most recent episode (or current) manic (Shelby Jordan) Long Term Goal(s): Safe transition to appropriate next level of care at discharge, Engage patient in therapeutic group addressing interpersonal concerns.  Short Term Goals: Engage patient in aftercare planning with referrals and resources  Therapeutic Interventions: Assess for all discharge needs, 1 to 1 time with Social worker, Explore available resources and support systems, Assess for adequacy in community support network, Educate family and significant other(s) on suicide prevention, Complete Psychosocial Assessment, Interpersonal group therapy.  Evaluation of Outcomes: Progressing   Progress in Treatment: Attending groups: Yes. Participating in groups: No. Taking medication as prescribed: Yes. Toleration medication: Yes. Family/Significant other contact made: Yes, individual(s) contacted:  Rolly Pancake, granddaughter Patient understands diagnosis: No. Discussing patient identified problems/goals with staff: Yes. Medical problems stabilized or resolved: No. Denies suicidal/homicidal ideation: Yes. Issues/concerns per patient self-inventory: No. Other: NA  New problem(s) identified: No, Describe:  none reported  New Short Term/Long Term Goal(s):Attend outpatient treatment, take medication as prescribed, develop and implement healthy coping  methods  Patient Goals:  "Regulate my medication"  Discharge Plan or Barriers: Pt will return home and follow up with outpatient treatment.  Reason for Continuation of Hospitalization: Mania Medication stabilization  Estimated Length of Stay:1-7 days  Attendees: Patient:Shelby Jordan 12/19/2019 3:23 PM  Physician: Alethia Berthold 12/19/2019 3:23 PM  Nursing: Laverta [place], nurse 12/19/2019 3:23 PM  RN Care Manager: 12/19/2019 3:23 PM  Social Worker: Anise Salvo 12/19/2019 3:23 PM  Recreational Therapist: Roanna Epley 12/19/2019 3:23 PM  Other:  12/19/2019 3:23 PM  Other:  12/19/2019 3:23 PM  Other: 12/19/2019 3:23 PM    Scribe for Treatment Team: Yvette Rack, LCSW 12/19/2019 3:23 PM

## 2019-12-19 NOTE — B[facility] Group Notes (Signed)
Feelings Around Relapse 12/19/2019 9:30AM/1PM  Type of Therapy and Topic:  Group Therapy:  Feelings around Relapse and Recovery  Participation Level:  None   Description of Group:    Patients in this group will discuss emotions they experience before and after a relapse. They will process how experiencing these feelings, or avoidance of experiencing them, relates to having a relapse. Facilitator will guide patients to explore emotions they have related to recovery. Patients will be encouraged to process which emotions are more powerful. They will be guided to discuss the emotional reaction significant others in their lives may have to patients' relapse or recovery. Patients will be assisted in exploring ways to respond to the emotions of others without this contributing to a relapse.  Therapeutic Goals: 1. Patient will identify two or more emotions that lead to a relapse for them 2. Patient will identify two emotions that result when they relapse 3. Patient will identify two emotions related to recovery 4. Patient will demonstrate ability to communicate their needs through discussion and/or role plays   Summary of Patient Progress: Pt attended group session but no participation. Pt presents in hyperverbal manner and required some redirection. For most of group session, the pt slept.    Therapeutic Modalities:   Cognitive Behavioral Therapy Solution-Focused Therapy Assertiveness Training Relapse Prevention Therapy   Yvette Rack, LCSW 12/19/2019 2:34 PM

## 2019-12-20 DIAGNOSIS — F3110 Bipolar disorder, current episode manic without psychotic features, unspecified: Secondary | ICD-10-CM | POA: Diagnosis not present

## 2019-12-20 MED ORDER — DIVALPROEX SODIUM 500 MG PO DR TAB: 500.0000 mg | DELAYED_RELEASE_TABLET | Freq: Two times a day (BID) | ORAL | 1 refills | Status: DC

## 2019-12-20 MED ORDER — DIPHENHYDRAMINE HCL 25 MG PO CAPS: 25.0000 mg | ORAL_CAPSULE | Freq: Every evening | ORAL | 1 refills | Status: DC | PRN

## 2019-12-20 MED ORDER — LISINOPRIL 40 MG PO TABS: 40.0000 mg | ORAL_TABLET | Freq: Every day | ORAL | 1 refills | Status: DC

## 2019-12-20 MED ORDER — AMLODIPINE BESYLATE 10 MG PO TABS: 10.0000 mg | ORAL_TABLET | Freq: Every day | ORAL | 1 refills | Status: DC

## 2019-12-20 NOTE — Progress Notes (Signed)
Pt is alert and oriented to person, place, time and somewhat to situation. When asked why pt was admitted to the hospital, what triggered her admission, pt states, "My daughter thought I was having a seizure, but I wasn't." When asked why she has been kept in the hospital since admission, despite the initial reason she thought she was brought in, pt states, "Because the lady in pink thinks I still need to be here." When asked if she is in the inpatient psychiatric unit due to her mental illness and needing treatment for related symptoms, pt states, "I think so." When asked if pt has a mental illness and what it was, pt states, "Oh yeah, I have bipolar." Pt was oriented and educated that she was in the hospital due to her mental illness symptoms, in particular manic behavior. Pt then states, "Oh yeah, I will take Depakote but I will not take Lithium it because makes me feel bad and drool." Pt was encouraged to speak to her MD about this today. Also this writer left a sticky note for pt's psychiatrist so he was aware she was refusing it and her reason. Pt is calm, cooperative, noted to be very social with peers and staff, remains hyperverbal, tangential, rapid speech, but overall improved since noted in previous shifts. Pt smiles on contact, affect is bright, pt attends groups and make phone calls to family members. No distress noted, none reported, pt voices no complaints. Will continue to monitor pt per Q15 minute face checks and monitor for safety and progress.

## 2019-12-20 NOTE — Progress Notes (Signed)
[facility] MD Progress Note  12/20/2019 2:28 PM Shelby Jordan  MRN:  [id number] Subjective: Patient seen and chart reviewed.  I also spoke with the patient's granddaughter on the telephone and then spoke with her son, Shelby Jordan, [phone].  They were both adamant that the patient is nowhere near her baseline and insisted that she would be a danger to herself outside the hospital.  It is true that the patient is still hyperverbal and tends to jump from topic to topic easily.  On the other hand I found that I can have a fairly lucid conversation with her.  She certainly is not threatening anyone else or threatening herself.  I have cut down on her Depakote level because of the elevated blood level the other day.  I also am stopping the lithium today after the patient insisted to me that lithium "makes her drool". Principal Problem: Bipolar I disorder, most recent episode (or current) manic ([name]) Diagnosis: Principal Problem:   Bipolar I disorder, most recent episode (or current) manic ([name]) Active Problems:   Essential hypertension  Total Time spent with patient: 30 minutes  Past Psychiatric History: Patient evidently has a long history of bipolar disorder with multiple prior hospitalizations.  Past Medical History:  Past Medical History:  Diagnosis Date  . Bipolar 1 disorder ([place])   . Hepatitis C   . Hypertension     Past Surgical History:  Procedure Laterality Date  . APPENDECTOMY    . TOE AMPUTATION     Family History: History reviewed. No pertinent family history. Family Psychiatric  History: None reported Social History:  Social History   Substance and Sexual Activity  Alcohol Use Never     Social History   Substance and Sexual Activity  Drug Use Never    Social History   Socioeconomic History  . Marital status: Legally Separated    Spouse name: Not on file  . Number of children: Not on file  . Years of education: Not on file  . Highest education level: Not on file   Occupational History  . Not on file  Tobacco Use  . Smoking status: Never Smoker  . Smokeless tobacco: Never Used  Substance and Sexual Activity  . Alcohol use: Never  . Drug use: Never  . Sexual activity: Not on file  Other Topics Concern  . Not on file  Social History Narrative  . Not on file   Social Determinants of Health   Financial Resource Strain:   . Difficulty of Paying Living Expenses:   Food Insecurity:   . Worried About Charity fundraiser in the Last Year:   . Arboriculturist in the Last Year:   Transportation Needs:   . Film/video editor (Medical):   Marland Kitchen Lack of Transportation (Non-Medical):   Physical Activity:   . Days of Exercise per Week:   . Minutes of Exercise per Session:   Stress:   . Feeling of Stress :   Social Connections:   . Frequency of Communication with Friends and Family:   . Frequency of Social Gatherings with Friends and Family:   . Attends Religious Services:   . Active Member of Clubs or Organizations:   . Attends Archivist Meetings:   Marland Kitchen Marital Status:    Additional Social History:                         Sleep: Fair  Appetite:  Fair  Current Medications:  Current Facility-Administered Medications  Medication Dose Route Frequency Provider Last Rate Last Admin  . acetaminophen (TYLENOL) tablet 650 mg  650 mg Oral Q6H PRN Eulas Post, MD   650 mg at 12/20/19 [id number]  . alum & mag hydroxide-simeth (MAALOX/MYLANTA) 200-200-20 MG/5ML suspension 30 mL  30 mL Oral Q4H PRN Eulas Post, MD      . amLODipine (NORVASC) tablet 10 mg  10 mg Oral Daily Sharma Covert, MD   10 mg at 12/20/19 [id number]  . diphenhydrAMINE (BENADRYL) capsule 25 mg  25 mg Oral QHS PRN Rylyn Ranganathan, Madie Reno, MD   25 mg at 12/19/19 2138  . divalproex (DEPAKOTE) DR tablet 500 mg  500 mg Oral Q12H Briasia Flinders, Madie Reno, MD   500 mg at 12/20/19 0809  . hydrOXYzine (ATARAX/VISTARIL) tablet 10 mg  10 mg Oral TID PRN Eulas Post, MD   10 mg at  12/17/19 1939  . lisinopril (ZESTRIL) tablet 40 mg  40 mg Oral Daily Shyvonne Chastang, Madie Reno, MD   40 mg at 12/20/19 [id number]  . magnesium hydroxide (MILK OF MAGNESIA) suspension 30 mL  30 mL Oral Daily PRN Eulas Post, MD      . temazepam (RESTORIL) capsule 30 mg  30 mg Oral QHS PRN Kaziyah Parkison, Madie Reno, MD   30 mg at 12/18/19 2126    Lab Results:  Results for orders placed or performed during the hospital encounter of 12/14/19 (from the past 48 hour(s))  Comprehensive metabolic panel     Status: Abnormal   Collection Time: 12/19/19  8:24 AM  Result Value Ref Range   Sodium 139 135 - 145 mmol/L   Potassium 4.9 3.5 - 5.1 mmol/L   Chloride 99 98 - 111 mmol/L   CO2 29 22 - 32 mmol/L   Glucose, Bld 146 (H) 70 - 99 mg/dL    Comment: Glucose reference range applies only to samples taken after fasting for at least 8 hours.   BUN 23 8 - 23 mg/dL   Creatinine, Ser 1.14 (H) 0.44 - 1.00 mg/dL   Calcium 10.2 8.9 - 10.3 mg/dL   Total Protein 7.6 6.5 - 8.1 g/dL   Albumin 4.5 3.5 - 5.0 g/dL   AST 20 15 - 41 U/L   ALT 19 0 - 44 U/L   Alkaline Phosphatase 44 38 - 126 U/L   Total Bilirubin 0.6 0.3 - 1.2 mg/dL   GFR calc non Af Amer 49 (L) >60 mL/min   GFR calc Af Amer 57 (L) >60 mL/min   Anion gap 11 5 - 15    Comment: Performed at [facility], [address] [place], [place] [address]  Valproic acid level     Status: Abnormal   Collection Time: 12/19/19  8:24 AM  Result Value Ref Range   Valproic Acid Lvl 175 (H) 50.0 - 100.0 ug/mL    Comment: RESULT CONFIRMED BY MANUAL DILUTION/HKP Performed at [facility], [address] [place], [place] [address]   CBC with Differential/Platelet     Status: None   Collection Time: 12/19/19  8:24 AM  Result Value Ref Range   WBC 9.1 4.0 - 10.5 K/uL   RBC 4.78 3.87 - 5.11 MIL/uL   Hemoglobin 14.2 12.0 - 15.0 g/dL   HCT 42.4 36.0 - 46.0 %   MCV 88.7 80.0 - 100.0 fL   MCH 29.7 26.0 - 34.0 pg   MCHC 33.5 30.0 - 36.0 g/dL   RDW 15.1 11.5 -  15.5 %  Platelets 326 150 - 400 K/uL   nRBC 0.0 0.0 - 0.2 %   Neutrophils Relative % 73 %   Neutro Abs 6.6 1.7 - 7.7 K/uL   Lymphocytes Relative 20 %   Lymphs Abs 1.8 0.7 - 4.0 K/uL   Monocytes Relative 6 %   Monocytes Absolute 0.5 0.1 - 1.0 K/uL   Eosinophils Relative 1 %   Eosinophils Absolute 0.1 0.0 - 0.5 K/uL   Basophils Relative 0 %   Basophils Absolute 0.0 0.0 - 0.1 K/uL   Immature Granulocytes 0 %   Abs Immature Granulocytes 0.03 0.00 - 0.07 K/uL    Comment: Performed at [facility], [address] [place], [place] [address]    Blood Alcohol level:  Lab Results  Component Value Date   ETH <10 [id number]    Metabolic Disorder Labs: Lab Results  Component Value Date   HGBA1C 6.3 (H) 12/14/2019   MPG 134.11 12/14/2019   No results found for: PROLACTIN No results found for: CHOL, TRIG, HDL, CHOLHDL, VLDL, LDLCALC  Physical Findings: AIMS:  , ,  ,  ,    CIWA:    COWS:     Musculoskeletal: Strength & Muscle Tone: within normal limits Gait & Station: normal Patient leans: N/A  Psychiatric Specialty Exam: Physical Exam  Nursing note and vitals reviewed. Constitutional: She appears well-developed and well-nourished.  HENT:  Head: Normocephalic and atraumatic.  Eyes: Pupils are equal, round, and reactive to light. Conjunctivae are normal.  Cardiovascular: Normal heart sounds.  Respiratory: Effort normal.  GI: Soft.  Musculoskeletal:        General: Normal range of motion.     Cervical back: Normal range of motion.  Neurological: She is alert.  Skin: Skin is warm and dry.  Psychiatric: She has a normal mood and affect. Her speech is rapid and/or pressured. She is not agitated and not aggressive. Thought content is not paranoid. Cognition and memory are normal. She expresses impulsivity. She expresses no homicidal and no suicidal ideation.    Review of Systems  Constitutional: Negative.   HENT: Negative.   Eyes: Negative.   Respiratory:  Negative.   Cardiovascular: Negative.   Gastrointestinal: Negative.   Musculoskeletal: Negative.   Skin: Negative.   Neurological: Negative.   Psychiatric/Behavioral: Negative.     Blood pressure (!) 138/91, pulse 91, temperature 97.8 F (36.6 C), temperature source Oral, resp. rate 18, height 5' 4.5" (1.638 m), weight 67.1 kg, SpO2 99 %.Body mass index is 25.01 kg/m.  General Appearance: Casual  Eye Contact:  Good  Speech:  Clear and Coherent  Volume:  Increased  Mood:  Euthymic  Affect:  Congruent  Thought Process:  Coherent  Orientation:  Full (Time, Place, and Person)  Thought Content:  Logical, Rumination and Tangential  Suicidal Thoughts:  No  Homicidal Thoughts:  No  Memory:  Immediate;   Fair Recent;   Fair Remote;   Fair  Judgement:  Fair  Insight:  Fair  Psychomotor Activity:  Normal  Concentration:  Concentration: Fair  Recall:  [employer] of Knowledge:  Fair  Language:  Fair  Akathisia:  No  Handed:  Right  AIMS (if indicated):     Assets:  Desire for Improvement Housing Physical Health Resilience Social Support  ADL's:  Impaired  Cognition:  WNL  Sleep:  Number of Hours: 6.25     Treatment Plan Summary: Daily contact with patient to assess and evaluate symptoms and progress in treatment, Medication management and  Plan The patient herself is still hyperverbal with some tendency to wander off topic but is also redirectable and does not show any sign of being delusional or responding to hallucinations.  Although she got irritated when told that she was not being discharged she did not lose her temper or become threatening.  From my perspective the patient seems to be reasonably under control in terms of her bipolar disorder and certainly much improved from admission, but with family members this hospital and they being the only people she can stay with it is not prudent to discharge her.  No change to medicine today.  Talked with patient a while.  We will  reassess tomorrow.  Alethia Berthold, MD 12/20/2019, 2:28 PM

## 2019-12-20 NOTE — Progress Notes (Signed)
Recreation Therapy Notes   Date: 12/20/2019  Time: 9:30 am  Location: Craft room  Behavioral response: Appropriate, redirection needed  Intervention Topic: Communication    Discussion/Intervention:  Group content today was focused on communication. The group defined communication and ways to communicate with others. Individuals stated reason why communication is important and some reasons to communicate with others. Patients expressed if they thought they were good at communicating with others and ways they could improve their communication skills. The group identified important parts of communication and some experiences they have had in the past with communication. The group participated in the intervention "What is that?", where they had a chance to test out their communication skills and identify ways to improve their communication techniques.  Clinical Observations/Feedback:  Patient came to group and defined communication as learning others language. She was on and off topic about her family redirection needed to focus on topic and task at hand. Individual was social with peers and staff while participating in the intervention.  Kiyan Burmester LRT/CTRS             Anayla Giannetti 12/20/2019 11:47 AM

## 2019-12-20 NOTE — BHH Group Notes (Signed)
[place] Group Notes:  (Nursing/MHT/Case Management/Adjunct)  Date:  12/20/2019  Time:  9:53 PM  Type of Therapy:  Group Therapy  Participation Level:  Active  Participation Quality:  Appropriate and talking doing group.  Affect:  Appropriate  Cognitive:  Alert  Insight:  Good  Engagement in Group:  Engaged and talking about her wallet and someone took and having a MIR  Modes of Intervention:  Support  Summary of Progress/Problems:  Shelby Jordan 12/20/2019, 9:53 PM

## 2019-12-20 NOTE — BHH Suicide Risk Assessment (Signed)
[facility] Discharge Suicide Risk Assessment   Principal Problem: Bipolar I disorder, most recent episode (or current) manic ([place]) Discharge Diagnoses: Principal Problem:   Bipolar I disorder, most recent episode (or current) manic ([place]) Active Problems:   Essential hypertension   Total Time spent with patient: 30 minutes  Musculoskeletal: Strength & Muscle Tone: within normal limits Gait & Station: normal Patient leans: N/A  Psychiatric Specialty Exam: Review of Systems  Constitutional: Negative.   HENT: Negative.   Eyes: Negative.   Respiratory: Negative.   Cardiovascular: Negative.   Gastrointestinal: Negative.   Musculoskeletal: Negative.   Skin: Negative.   Neurological: Negative.   Psychiatric/Behavioral: Negative.     Blood pressure (!) 138/91, pulse 91, temperature 97.8 F (36.6 C), temperature source Oral, resp. rate 18, height 5' 4.5" (1.638 m), weight 67.1 kg, SpO2 99 %.Body mass index is 25.01 kg/m.  General Appearance: Casual  Eye Contact::  Good  Speech:  Clear and [id number]  Volume:  Normal  Mood:  Euthymic  Affect:  Congruent  Thought Process:  Goal Directed  Orientation:  Full (Time, Place, and Person)  Thought Content:  Logical  Suicidal Thoughts:  No  Homicidal Thoughts:  No  Memory:  Immediate;   Fair Recent;   Fair Remote;   Fair  Judgement:  Fair  Insight:  Fair  Psychomotor Activity:  Normal  Concentration:  Fair  Recall:  [employer] of Knowledge:Fair  Language: Fair  Akathisia:  No  Handed:  Right  AIMS (if indicated):     Assets:  Desire for Improvement Housing Physical Health Resilience Social Support  Sleep:  Number of Hours: 6.25  Cognition: WNL  ADL's:  Intact   Mental Status Per Nursing Assessment::   On Admission:  NA  Demographic Factors:  Divorced or widowed  Loss Factors: NA  Historical Factors: Impulsivity  Risk Reduction Factors:   Sense of responsibility to family, Religious beliefs about death, Positive  social support, Positive therapeutic relationship and Positive coping skills or problem solving skills  Continued Clinical Symptoms:  Bipolar Disorder:   Mixed State  Cognitive Features That Contribute To Risk:  None    Suicide Risk:  Minimal: No identifiable suicidal ideation.  Patients presenting with no risk factors but with morbid ruminations; may be classified as minimal risk based on the severity of the depressive symptoms  Follow-up Information    [facility] Follow up.   Contact information: [place] [address] [phone]           Plan Of Care/Follow-up recommendations:  Activity:  Activity as tolerated Diet:  Regular diet, minimal salt if possible Other:  Follow-up at [place], MD 12/20/2019, 2:02 PM

## 2019-12-20 NOTE — Plan of Care (Signed)
  Problem: Education: Goal: Knowledge of [facility] Education information/materials will improve Outcome: Not Progressing Goal: Emotional status will improve Outcome: Not Progressing Goal: Mental status will improve Outcome: Not Progressing Goal: Verbalization of understanding the information provided will improve Outcome: Not Progressing   Problem: Activity: Goal: Interest or engagement in activities will improve Outcome: Not Progressing Goal: Sleeping patterns will improve Outcome: Not Progressing   Problem: Coping: Goal: Ability to verbalize frustrations and anger appropriately will improve Outcome: Not Progressing Goal: Ability to demonstrate self-control will improve Outcome: Not Progressing   Problem: Health Behavior/Discharge Planning: Goal: Identification of resources available to assist in meeting health care needs will improve Outcome: Not Progressing Goal: Compliance with treatment plan for underlying cause of condition will improve Outcome: Not Progressing   Problem: Physical Regulation: Goal: Ability to maintain clinical measurements within normal limits will improve Outcome: Not Progressing   Problem: Safety: Goal: Periods of time without injury will increase Outcome: Not Progressing   Problem: Activity: Goal: Will verbalize the importance of balancing activity with adequate rest periods Outcome: Not Progressing   Problem: Education: Goal: Will be free of psychotic symptoms Outcome: Not Progressing Goal: Knowledge of the prescribed therapeutic regimen will improve Outcome: Not Progressing   Problem: Coping: Goal: Coping ability will improve Outcome: Not Progressing Goal: Will verbalize feelings Outcome: Not Progressing   Problem: Health Behavior/Discharge Planning: Goal: Compliance with prescribed medication regimen will improve Outcome: Not Progressing   Problem: Nutritional: Goal: Ability to achieve adequate nutritional intake will  improve Outcome: Not Progressing   Problem: Role Relationship: Goal: Ability to communicate needs accurately will improve Outcome: Not Progressing Goal: Ability to interact with others will improve Outcome: Not Progressing   Problem: Safety: Goal: Ability to redirect hostility and anger into socially appropriate behaviors will improve Outcome: Not Progressing Goal: Ability to remain free from injury will improve Outcome: Not Progressing   Problem: Self-Care: Goal: Ability to participate in self-care as condition permits will improve Outcome: Not Progressing   Problem: Self-Concept: Goal: Will verbalize positive feelings about self Outcome: Not Progressing   

## 2019-12-20 NOTE — Progress Notes (Signed)
Patient is pleasant and easy to engage with. She is hyper verbal and eager to talk and ruminate about her life and history. She accepted prescribed meds and tolerated without incident. Patient is safe on the unit with 15 minute safety checks. Informed to contact staff with concerns.

## 2019-12-20 NOTE — BHH Group Notes (Signed)
LCSW Group Therapy Note  12/20/2019 1:00 PM  Type of Therapy/Topic:  Group Therapy:  Emotion Regulation  Participation Level:  Minimal   Description of Group:   The purpose of this group is to assist patients in learning to regulate negative emotions and experience positive emotions. Patients will be guided to discuss ways in which they have been vulnerable to their negative emotions. These vulnerabilities will be juxtaposed with experiences of positive emotions or situations, and patients will be challenged to use positive emotions to combat negative ones. Special emphasis will be placed on coping with negative emotions in conflict situations, and patients will process healthy conflict resolution skills.  Therapeutic Goals: 1. Patient will identify two positive emotions or experiences to reflect on in order to balance out negative emotions 2. Patient will label two or more emotions that they find the most difficult to experience 3. Patient will demonstrate positive conflict resolution skills through discussion and/or role plays  Summary of Patient Progress: Patient was present in group. Patient was intrusive and disruptive to group, similar to last presentation when in this CSW's group.  CSW had to redirect often.  CSW notes that pt presented better at treatment team the day before. Patient did try to share on topic.    Therapeutic Modalities:   Cognitive Behavioral Therapy Feelings Identification Dialectical Behavioral Therapy  Assunta Curtis, MSW, LCSW 12/20/2019 2:18 PM

## 2019-12-21 DIAGNOSIS — F3110 Bipolar disorder, current episode manic without psychotic features, unspecified: Secondary | ICD-10-CM | POA: Diagnosis not present

## 2019-12-21 MED ORDER — DIVALPROEX SODIUM 500 MG PO DR TAB
1000.0000 mg | DELAYED_RELEASE_TABLET | Freq: Every day | ORAL | Status: DC
  Administered 2019-12-21 – 2019-12-26 (×6): 1000 mg via ORAL
  Filled 2019-12-21 (×6): qty 2

## 2019-12-21 MED ORDER — DIVALPROEX SODIUM 500 MG PO DR TAB
500.0000 mg | DELAYED_RELEASE_TABLET | Freq: Every day | ORAL | Status: DC
  Administered 2019-12-22 – 2019-12-27 (×6): 500 mg via ORAL
  Filled 2019-12-21 (×6): qty 1

## 2019-12-21 MED ORDER — METOPROLOL SUCCINATE ER 25 MG PO TB24
25.0000 mg | ORAL_TABLET | Freq: Every day | ORAL | Status: DC
  Administered 2019-12-21 – 2019-12-27 (×7): 25 mg via ORAL
  Filled 2019-12-21 (×8): qty 1

## 2019-12-21 NOTE — Progress Notes (Signed)
Pt is alert and oriented to person, place, but not to time and situation. Pt denies suicidal and homicidal ideation, denies hallucinations, denies feelings of depression and anxiety, is medication compliant. Pt is noted to be hyperverbal, intrusive, impulsive, easily irritable. Pt is very social with staff and peers. Pt spends most of the evening talking on the telephone with family and or in the dayroom watching tv and talking to peers. No distress noted, none reported, will continue to monitor pt per Q15 minute face checks and monitor for safety and progress.

## 2019-12-21 NOTE — Progress Notes (Signed)
Pt is alert and oriented to person, place, time and situation. Pt is hyperverbal, intrusive, irritable at times, impulsive, rapid pressured speech, requires redirection at times, is social with staff and very social with peers. Pt denies suicidal and homicidal ideation, denies hallucinations, denies feelings of depression and anxiety. No distress noted, none reported, pt voiced no complaints. Will continue to monitor pt per Q15 minute face checks and monitor for safety and progress.

## 2019-12-21 NOTE — Progress Notes (Signed)
[facility] MD Progress Note  12/21/2019 12:35 PM Chelly Delay  MRN:  [id number] Subjective: Patient seen and chart reviewed.  Patient continues to present as hyperverbal and intrusive.  Not threatening.  Denies suicidal or homicidal ideation.  However, when the topic of her family comes up she gets very animated and insistent that they do not know what they are talking about.  Reviewed her behavior with full treatment team.  I think everyone sees that he was calm her on the higher Depakote dose although that level was excessive.  I talked with her today about increasing medicine doses.  She was agreeable to my increasing the Depakote up to a total of 1500 mg a day but insisted that she would not take lithium.  She told me that it "made her feel like a zombie".  I told her I thought I could keep the level low enough that we would avoid side effects but she refused.  I then proposed 1 of several other medicines including Seroquel and Zyprexa and Risperdal but she declined all of those saying she would only take the Depakote for now.  She is not violent on the unit but her insight is still limited. Principal Problem: Bipolar I disorder, most recent episode (or current) manic ([place]) Diagnosis: Principal Problem:   Bipolar I disorder, most recent episode (or current) manic ([place]) Active Problems:   Essential hypertension  Total Time spent with patient: 30 minutes  Past Psychiatric History: Past history of chronic and recurrent bipolar disorder  Past Medical History:  Past Medical History:  Diagnosis Date  . Bipolar 1 disorder ([place])   . Hepatitis C   . Hypertension     Past Surgical History:  Procedure Laterality Date  . APPENDECTOMY    . TOE AMPUTATION     Family History: History reviewed. No pertinent family history. Family Psychiatric  History: See previous Social History:  Social History   Substance and Sexual Activity  Alcohol Use Never     Social History   Substance and Sexual Activity   Drug Use Never    Social History   Socioeconomic History  . Marital status: Legally Separated    Spouse name: Not on file  . Number of children: Not on file  . Years of education: Not on file  . Highest education level: Not on file  Occupational History  . Not on file  Tobacco Use  . Smoking status: Never Smoker  . Smokeless tobacco: Never Used  Substance and Sexual Activity  . Alcohol use: Never  . Drug use: Never  . Sexual activity: Not on file  Other Topics Concern  . Not on file  Social History Narrative  . Not on file   Social Determinants of Health   Financial Resource Strain:   . Difficulty of Paying Living Expenses:   Food Insecurity:   . Worried About Charity fundraiser in the Last Year:   . Arboriculturist in the Last Year:   Transportation Needs:   . Film/video editor (Medical):   Marland Kitchen Lack of Transportation (Non-Medical):   Physical Activity:   . Days of Exercise per Week:   . Minutes of Exercise per Session:   Stress:   . Feeling of Stress :   Social Connections:   . Frequency of Communication with Friends and Family:   . Frequency of Social Gatherings with Friends and Family:   . Attends Religious Services:   . Active Member of Clubs or Organizations:   .  Attends Archivist Meetings:   Marland Kitchen Marital Status:    Additional Social History:                         Sleep: Fair  Appetite:  Fair  Current Medications: Current Facility-Administered Medications  Medication Dose Route Frequency Provider Last Rate Last Admin  . acetaminophen (TYLENOL) tablet 650 mg  650 mg Oral Q6H PRN Eulas Post, MD   650 mg at 12/21/19 0802  . alum & mag hydroxide-simeth (MAALOX/MYLANTA) 200-200-20 MG/5ML suspension 30 mL  30 mL Oral Q4H PRN Eulas Post, MD      . amLODipine (NORVASC) tablet 10 mg  10 mg Oral Daily Sharma Covert, MD   10 mg at 12/21/19 0800  . diphenhydrAMINE (BENADRYL) capsule 25 mg  25 mg Oral QHS PRN Kamoni Gentles, Madie Reno, MD   25 mg at 12/20/19 2115  . divalproex (DEPAKOTE) DR tablet 500 mg  500 mg Oral Q12H Caterina Racine T, MD   500 mg at 12/21/19 0759  . hydrOXYzine (ATARAX/VISTARIL) tablet 10 mg  10 mg Oral TID PRN Eulas Post, MD   10 mg at 12/17/19 1939  . lisinopril (ZESTRIL) tablet 40 mg  40 mg Oral Daily Trissa Molina T, MD   40 mg at 12/21/19 0800  . magnesium hydroxide (MILK OF MAGNESIA) suspension 30 mL  30 mL Oral Daily PRN Eulas Post, MD      . temazepam (RESTORIL) capsule 30 mg  30 mg Oral QHS PRN Jaelah Hauth, Madie Reno, MD   30 mg at 12/18/19 2126    Lab Results: No results found for this or any previous visit (from the past 78 hour(s)).  Blood Alcohol level:  Lab Results  Component Value Date   ETH <10 [id number]    Metabolic Disorder Labs: Lab Results  Component Value Date   HGBA1C 6.3 (H) 12/14/2019   MPG 134.11 12/14/2019   No results found for: PROLACTIN No results found for: CHOL, TRIG, HDL, CHOLHDL, VLDL, LDLCALC  Physical Findings: AIMS:  , ,  ,  ,    CIWA:    COWS:     Musculoskeletal: Strength & Muscle Tone: within normal limits Gait & Station: normal Patient leans: N/A  Psychiatric Specialty Exam: Physical Exam  Nursing note and vitals reviewed. Constitutional: She appears well-developed and well-nourished.  HENT:  Head: Normocephalic and atraumatic.  Eyes: Pupils are equal, round, and reactive to light. Conjunctivae are normal.  Cardiovascular: Regular rhythm and normal heart sounds.  Respiratory: Effort normal.  GI: Soft.  Musculoskeletal:        General: Normal range of motion.     Cervical back: Normal range of motion.  Neurological: She is alert.  Skin: Skin is warm and dry.  Psychiatric: Her affect is labile. Her speech is tangential. She is agitated. She is not aggressive. Thought content is paranoid. Cognition and memory are normal. She expresses impulsivity. She expresses no homicidal and no suicidal ideation.    Review of Systems   Constitutional: Negative.   HENT: Negative.   Eyes: Negative.   Respiratory: Negative.   Cardiovascular: Negative.   Gastrointestinal: Negative.   Musculoskeletal: Negative.   Skin: Negative.   Neurological: Negative.   Psychiatric/Behavioral: Negative for suicidal ideas. The patient is nervous/anxious.     Blood pressure (!) 150/87, pulse 94, temperature 98.7 F (37.1 C), temperature source Oral, resp. rate 18, height 5' 4.5" (1.638 m), weight 67.1 kg, SpO2 100 %.Body mass  index is 25.01 kg/m.  General Appearance: Casual  Eye Contact:  Fair  Speech:  Pressured  Volume:  Increased  Mood:  Irritable  Affect:  Congruent  Thought Process:  Disorganized  Orientation:  Full (Time, Place, and Person)  Thought Content:  Tangential  Suicidal Thoughts:  No  Homicidal Thoughts:  No  Memory:  Immediate;   Fair Recent;   Fair Remote;   Fair  Judgement:  Impaired  Insight:  Shallow  Psychomotor Activity:  Restlessness  Concentration:  Concentration: Fair  Recall:  [employer] of Knowledge:  Fair  Language:  Fair  Akathisia:  No  Handed:  Right  AIMS (if indicated):     Assets:  Desire for Improvement Resilience Social Support  ADL's:  Impaired  Cognition:  Impaired,  Mild  Sleep:  Number of Hours: 3.75     Treatment Plan Summary: Daily contact with patient to assess and evaluate symptoms and progress in treatment, Medication management and Plan It certainly is true that she is sleeping poorly again and that she seems more animated and a little more disorganized.  She is refusing to consider lithium or any antipsychotic or any medicine other than Depakote.  I will increase the Depakote level back up to 1500 mg.  Continue group therapy on the unit.  Blood pressure seems to still be slightly high and I will adjust her medicine accordingly.  Patient especially by her family's assessment of their capacity to care for her is still not at a point where she is ready for discharge  Alethia Berthold, MD 12/21/2019, 12:35 PM

## 2019-12-21 NOTE — BHH Group Notes (Signed)
Balance In Life 12/21/2019 9:30AM/1PM  Type of Therapy/Topic:  Group Therapy:  Balance in Life  Participation Level:  Active  Description of Group:   This group will address the concept of balance and how it feels and looks when one is unbalanced. Patients will be encouraged to process areas in their lives that are out of balance and identify reasons for remaining unbalanced. Facilitators will guide patients in utilizing problem-solving interventions to address and correct the stressor making their life unbalanced. Understanding and applying boundaries will be explored and addressed for obtaining and maintaining a balanced life. Patients will be encouraged to explore ways to assertively make their unbalanced needs known to significant others in their lives, using other group members and facilitator for support and feedback.  Therapeutic Goals: 1. Patient will identify two or more emotions or situations they have that consume much of in their lives. 2. Patient will identify signs/triggers that life has become out of balance:  3. Patient will identify two ways to set boundaries in order to achieve balance in their lives:  4. Patient will demonstrate ability to communicate their needs through discussion and/or role plays  Summary of Patient Progress: Pt actively and appropriately participated in group session. Pt identified family members as a stressor but identified getting adequate rest to find balance in life. Pt demonstrated understanding of subject matter and respected boundaries during session.   Therapeutic Modalities:   Cognitive Behavioral Therapy Solution-Focused Therapy Assertiveness Training  Rana Adorno Lynelle Smoke, LCSW

## 2019-12-21 NOTE — Progress Notes (Signed)
BRIEF PHARMACY NOTE   This patient attended and participated in Medication Management Group counseling led by [facility] staff pharmacist.  This interactive class reviews basic information about prescription medications and education on personal responsibility in medication management.  The class also includes general knowledge of 3 main classes of behavioral medications, including antipsychotics, antidepressants, and mood stabilizers.     Patient behavior was appropriate for group setting.   Educational materials sourced from:  "Medication Do's and Don'ts" from [email or web address]   "Mental Health Medications" from [email or web address] [place], PharmD, BCPS Clinical Pharmacist 12/21/2019 2:55 PM

## 2019-12-21 NOTE — Plan of Care (Signed)
  Problem: Education: Goal: Emotional status will improve Outcome: Progressing Goal: Mental status will improve Outcome: Not Progressing   Problem: Activity: Goal: Interest or engagement in activities will improve Outcome: Progressing   Problem: Coping: Goal: Ability to demonstrate self-control will improve Outcome: Not Progressing   Problem: Health Behavior/Discharge Planning: Goal: Compliance with treatment plan for underlying cause of condition will improve Outcome: Progressing   Problem: Safety: Goal: Periods of time without injury will increase Outcome: Progressing

## 2019-12-21 NOTE — Progress Notes (Signed)
Patient alert and oriented with tangential speech. Denies any SI, HI, AVH. Medication compliant, appropriate with peers. Slightly intrusive to staff with many request. Patient with bright affect. Pt refused prn sleep aid, but requested benadryl for sleep with good relief.  Encouragement and support provided. Safety checks maintained. Medications given as prescribed. Pt receptive and remains safe on unit with q 15 min checks.

## 2019-12-21 NOTE — Progress Notes (Signed)
Recreation Therapy Notes  Date: 12/21/2019  Time: 9:30 am  Location: Room 21   Behavioral response: Appropriate   Intervention Topic: Animal Assisted Therapy   Discussion/Intervention:  Animal Assisted Therapy took place today during group.  Animal Assisted Therapy is the planned inclusion of an animal in a patient's treatment plan. The patients were able to engage in therapy with an animal during group. Participants were educated on what a service dog is and the different between a support dog and a service dog. Patient were informed on the many animal needs there are and how their needs are similar. Individuals were enlightened on the process to get a service animal or support animal. Patients got the opportunity to pet the animal and were offered emotional support from the animal and staff.  Clinical Observations/Feedback:  Patient came to group and was on topic and was focused on what peers and staff had to say. Participant shared their experiences and history with animals. Individual was social with peers, staff and animal while participating in group.  Quentin Shorey LRT/CTRS         Reade Trefz 12/21/2019 11:50 AM

## 2019-12-22 DIAGNOSIS — F3110 Bipolar disorder, current episode manic without psychotic features, unspecified: Secondary | ICD-10-CM | POA: Diagnosis not present

## 2019-12-22 MED ORDER — GUAIFENESIN-DM 100-10 MG/5ML PO SYRP
5.0000 mL | ORAL_SOLUTION | ORAL | Status: DC | PRN
  Administered 2019-12-22 – 2019-12-26 (×7): 5 mL via ORAL
  Filled 2019-12-22 (×8): qty 5

## 2019-12-22 MED ORDER — LITHIUM CARBONATE ER 300 MG PO TBCR
300.0000 mg | EXTENDED_RELEASE_TABLET | Freq: Every day | ORAL | Status: DC
  Administered 2019-12-22 – 2019-12-26 (×5): 300 mg via ORAL
  Filled 2019-12-22 (×5): qty 1

## 2019-12-22 NOTE — Progress Notes (Signed)
[facility] MD Progress Note  12/22/2019 2:11 PM Shelby Jordan  MRN:  [id number] Subjective: Follow-up patient with bipolar disorder.  Remains hyperverbal and hyperactive.  A little disorganized in thinking but not delusional.  Today also complaining of having a cough which was easily addressed with standard cough medicine.  I prevailed on her to really reconsider and let me give her a small dose of lithium at night.  She was agreeable to at least trying at this time. Principal Problem: Bipolar I disorder, most recent episode (or current) manic ([place]) Diagnosis: Principal Problem:   Bipolar I disorder, most recent episode (or current) manic ([place]) Active Problems:   Essential hypertension  Total Time spent with patient: 30 minutes  Past Psychiatric History: Past history of recurrent bipolar disorder mania  Past Medical History:  Past Medical History:  Diagnosis Date  . Bipolar 1 disorder ([place])   . Hepatitis C   . Hypertension     Past Surgical History:  Procedure Laterality Date  . APPENDECTOMY    . TOE AMPUTATION     Family History: History reviewed. No pertinent family history. Family Psychiatric  History: See previous Social History:  Social History   Substance and Sexual Activity  Alcohol Use Never     Social History   Substance and Sexual Activity  Drug Use Never    Social History   Socioeconomic History  . Marital status: Legally Separated    Spouse name: Not on file  . Number of children: Not on file  . Years of education: Not on file  . Highest education level: Not on file  Occupational History  . Not on file  Tobacco Use  . Smoking status: Never Smoker  . Smokeless tobacco: Never Used  Substance and Sexual Activity  . Alcohol use: Never  . Drug use: Never  . Sexual activity: Not on file  Other Topics Concern  . Not on file  Social History Narrative  . Not on file   Social Determinants of Health   Financial Resource Strain:   . Difficulty of Paying Living  Expenses:   Food Insecurity:   . Worried About Charity fundraiser in the Last Year:   . Arboriculturist in the Last Year:   Transportation Needs:   . Film/video editor (Medical):   Marland Kitchen Lack of Transportation (Non-Medical):   Physical Activity:   . Days of Exercise per Week:   . Minutes of Exercise per Session:   Stress:   . Feeling of Stress :   Social Connections:   . Frequency of Communication with Friends and Family:   . Frequency of Social Gatherings with Friends and Family:   . Attends Religious Services:   . Active Member of Clubs or Organizations:   . Attends Archivist Meetings:   Marland Kitchen Marital Status:    Additional Social History:                         Sleep: Fair  Appetite:  Fair  Current Medications: Current Facility-Administered Medications  Medication Dose Route Frequency Provider Last Rate Last Admin  . acetaminophen (TYLENOL) tablet 650 mg  650 mg Oral Q6H PRN Eulas Post, MD   650 mg at 12/21/19 1839  . alum & mag hydroxide-simeth (MAALOX/MYLANTA) 200-200-20 MG/5ML suspension 30 mL  30 mL Oral Q4H PRN Eulas Post, MD      . amLODipine (NORVASC) tablet 10 mg  10 mg Oral Daily Clary,  Cordie Grice, MD   10 mg at 12/22/19 0718  . diphenhydrAMINE (BENADRYL) capsule 25 mg  25 mg Oral QHS PRN Kiyo Heal, Madie Reno, MD   25 mg at 12/21/19 2122  . divalproex (DEPAKOTE) DR tablet 1,000 mg  1,000 mg Oral QHS Xian Apostol T, MD   1,000 mg at 12/21/19 2121  . divalproex (DEPAKOTE) DR tablet 500 mg  500 mg Oral Daily Neylan Koroma, Madie Reno, MD   500 mg at 12/22/19 0718  . guaiFENesin-dextromethorphan (ROBITUSSIN DM) 100-10 MG/5ML syrup 5 mL  5 mL Oral Q4H PRN Yariela Tison T, MD      . hydrOXYzine (ATARAX/VISTARIL) tablet 10 mg  10 mg Oral TID PRN Eulas Post, MD   10 mg at 12/22/19 0175  . lisinopril (ZESTRIL) tablet 40 mg  40 mg Oral Daily Anguel Delapena, Madie Reno, MD   40 mg at 12/22/19 0717  . lithium carbonate (LITHOBID) CR tablet 300 mg  300 mg Oral QHS  Mathieu Schloemer T, MD      . magnesium hydroxide (MILK OF MAGNESIA) suspension 30 mL  30 mL Oral Daily PRN Eulas Post, MD      . metoprolol succinate (TOPROL-XL) 24 hr tablet 25 mg  25 mg Oral Daily Raynetta Osterloh, Madie Reno, MD   25 mg at 12/22/19 0717  . temazepam (RESTORIL) capsule 30 mg  30 mg Oral QHS PRN Sheryl Saintil, Madie Reno, MD   30 mg at 12/18/19 2126    Lab Results: No results found for this or any previous visit (from the past 48 hour(s)).  Blood Alcohol level:  Lab Results  Component Value Date   ETH <10 05/13/8526    Metabolic Disorder Labs: Lab Results  Component Value Date   HGBA1C 6.3 (H) 12/14/2019   MPG 134.11 12/14/2019   No results found for: PROLACTIN No results found for: CHOL, TRIG, HDL, CHOLHDL, VLDL, LDLCALC  Physical Findings: AIMS:  , ,  ,  ,    CIWA:    COWS:     Musculoskeletal: Strength & Muscle Tone: within normal limits Gait & Station: normal Patient leans: N/A  Psychiatric Specialty Exam: Physical Exam  Nursing note and vitals reviewed. Constitutional: She appears well-developed and well-nourished.  HENT:  Head: Normocephalic and atraumatic.  Eyes: Pupils are equal, round, and reactive to light. Conjunctivae are normal.  Cardiovascular: Regular rhythm and normal heart sounds.  Respiratory: Effort normal.  GI: Soft.  Musculoskeletal:        General: Normal range of motion.     Cervical back: Normal range of motion.  Neurological: She is alert.  Skin: Skin is warm and dry.  Psychiatric: Her mood appears anxious. Her affect is labile. Her speech is tangential. She is agitated. She is not aggressive. Cognition and memory are impaired. She expresses impulsivity. She expresses no homicidal and no suicidal ideation.    Review of Systems  Constitutional: Negative.   HENT: Negative.   Eyes: Negative.   Respiratory: Positive for cough.   Cardiovascular: Negative.   Gastrointestinal: Negative.   Musculoskeletal: Negative.   Skin: Negative.    Neurological: Negative.   Psychiatric/Behavioral: Positive for confusion.    Blood pressure (!) 155/135, pulse 75, temperature 97.8 F (36.6 C), temperature source Oral, resp. rate 18, height 5' 4.5" (1.638 m), weight 67.1 kg, SpO2 100 %.Body mass index is 25.01 kg/m.  General Appearance: Casual  Eye Contact:  Fair  Speech:  Pressured  Volume:  Increased  Mood:  Anxious  Affect:  Labile  Thought  Process:  Disorganized  Orientation:  Full (Time, Place, and Person)  Thought Content:  Rumination and Tangential  Suicidal Thoughts:  No  Homicidal Thoughts:  No  Memory:  Immediate;   Fair Recent;   Fair Remote;   Fair  Judgement:  Impaired  Insight:  Shallow  Psychomotor Activity:  Increased  Concentration:  Concentration: Fair  Recall:  [employer] of Knowledge:  Fair  Language:  Fair  Akathisia:  No  Handed:  Right  AIMS (if indicated):     Assets:  Desire for Improvement Social Support  ADL's:  Impaired  Cognition:  Impaired,  Mild  Sleep:  Number of Hours: 3.25     Treatment Plan Summary: Daily contact with patient to assess and evaluate symptoms and progress in treatment, Medication management and Plan Tolerating increased Depakote dose to a total of 1500 mg a day.  Adding 300 mg of lithium at night.  Encourage group attendance which she is already doing.  Gave her cough medicine for what seems like a mild thing no other respiratory symptoms.  Alethia Berthold, MD 12/22/2019, 2:11 PM

## 2019-12-22 NOTE — BHH Group Notes (Signed)
LCSW Group Therapy Note  12/22/2019 2:59 PM  Type of Therapy and Topic:  Group Therapy:  Feelings around Relapse and Recovery  Participation Level:  None   Description of Group:    Patients in this group will discuss emotions they experience before and after a relapse. They will process how experiencing these feelings, or avoidance of experiencing them, relates to having a relapse. Facilitator will guide patients to explore emotions they have related to recovery. Patients will be encouraged to process which emotions are more powerful. They will be guided to discuss the emotional reaction significant others in their lives may have to their relapse or recovery. Patients will be assisted in exploring ways to respond to the emotions of others without this contributing to a relapse.  Therapeutic Goals: 1. Patient will identify two or more emotions that lead to a relapse for them 2. Patient will identify two emotions that result when they relapse 3. Patient will identify two emotions related to recovery 4. Patient will demonstrate ability to communicate their needs through discussion and/or role plays   Summary of Patient Progress: Patient was present for group.  Patient slept most of group, however would wake up in and interject her.  Comments interjected by the patients were not on topic.    Therapeutic Modalities:   Cognitive Behavioral Therapy Solution-Focused Therapy Assertiveness Training Relapse Prevention Therapy   Assunta Curtis, MSW, LCSW 12/22/2019 2:59 PM

## 2019-12-22 NOTE — Progress Notes (Signed)
Recreation Therapy Notes  Date: 12/22/2019  Time: 9:30 am   Location: Craft room   Behavioral response: N/A   Intervention Topic: Emotions   Discussion/Intervention: Patient did not attend group.   Clinical Observations/Feedback:  Patient did not attend group.   Neidy Guerrieri LRT/CTRS        Kit Brubacher 12/22/2019 12:56 PM

## 2019-12-22 NOTE — Progress Notes (Signed)
MHT engaged in nightly group greeting and introducing self to patient. MHT went over the rules for the unit, provided snack, and went over the remaining routine for the night. MHT interacted with patient asking what a good coping skill is that is used daily and a good goal patient is working on meeting. Patient responded that her coping skill is deep breathing when she becomes angry. Patients goal is to go home.

## 2019-12-22 NOTE — Plan of Care (Signed)
Data: Patient is known to this nurse today. Patient continues to be impulsive and intrusive to staff.  Patient denies SI/HI/AVH. Patient has not completed daily self inventory worksheet. Patient reports pain rating of 0/10. Patient rates depression "0/10" , feelings of hopelessness "0/10" and anxiety "0/10" Patients goal for today is "talk to my son."   Action:  Q x 15 minute observation checks were completed for safety. Patient was provided with education on medications. Patient was offered support and encouragement. Patient was given scheduled medications. Patient  was encourage to attend groups, participate in unit activities and continue with plan of care.    Response: Patient is adherent with scheduled medication. Patient has no complaints at this time. Patient is receptive to treatment and safety maintained on unit.    Problem: Education: Goal: Knowledge of [place] [facility] Education information/materials will improve Outcome: Not Progressing Goal: Emotional status will improve Outcome: Not Progressing Goal: Mental status will improve Outcome: Not Progressing Goal: Verbalization of understanding the information provided will improve Outcome: Not Progressing

## 2019-12-23 DIAGNOSIS — F3110 Bipolar disorder, current episode manic without psychotic features, unspecified: Secondary | ICD-10-CM | POA: Diagnosis not present

## 2019-12-23 LAB — URINALYSIS, COMPLETE (UACMP) WITH MICROSCOPIC
Bacteria, UA: NONE SEEN
Bilirubin Urine: NEGATIVE
Glucose, UA: NEGATIVE mg/dL
Hgb urine dipstick: NEGATIVE
Ketones, ur: NEGATIVE mg/dL
Leukocytes,Ua: NEGATIVE
Nitrite: NEGATIVE
Protein, ur: NEGATIVE mg/dL
Specific Gravity, Urine: 1.013 (ref 1.005–1.030)
pH: 6 (ref 5.0–8.0)

## 2019-12-23 MED ORDER — ENSURE ENLIVE PO LIQD
237.0000 mL | Freq: Two times a day (BID) | ORAL | Status: DC
  Administered 2019-12-23 – 2019-12-27 (×8): 237 mL via ORAL

## 2019-12-23 NOTE — BHH Group Notes (Signed)
[facility] LCSW Group Therapy Note  Date/Time: 12/23/2019 - 1:00pm  Type of Therapy/Topic:  Group Therapy:  Feelings about Diagnosis  Participation Level:  Active   Mood: Talkative   Description of Group:    This group will allow patients to explore their thoughts and feelings about diagnoses they have received. Patients will be guided to explore their level of understanding and acceptance of these diagnoses. Facilitator will encourage patients to process their thoughts and feelings about the reactions of others to their diagnosis, and will guide patients in identifying ways to discuss their diagnosis with significant others in their lives. This group will be process-oriented, with patients participating in exploration of their own experiences as well as giving and receiving support and challenge from other group members.   Therapeutic Goals: 1. Patient will demonstrate understanding of diagnosis as evidence by identifying two or more symptoms of the disorder:  2. Patient will be able to express two feelings regarding the diagnosis 3. Patient will demonstrate ability to communicate their needs through discussion and/or role plays  Summary of Patient Progress:    Patient was active throughout group therapy. Patient talked over multiple people when they were talking and had many manic behaviors throughout group. Patient had to be redirected several times. Patient discussed having bipolar and realizing that she was bipolar after giving birth to her daughter in the 15s. Patient left group for about 15 minutes before coming back and discussing topics that were not on topic of diagnosis.     Therapeutic Modalities:   Cognitive Behavioral Therapy Brief Therapy Feelings Identification   Ardelle Anton, LCSW

## 2019-12-23 NOTE — Tx Team (Signed)
Interdisciplinary Treatment and Diagnostic Plan Update  12/23/2019 Time of Session: 10:54am Shelby Jordan MRN: [id number]  Principal Diagnosis: Bipolar I disorder, most recent episode (or current) manic ([place])  Secondary Diagnoses: Principal Problem:   Bipolar I disorder, most recent episode (or current) manic ([place]) Active Problems:   Essential hypertension   Current Medications:  Current Facility-Administered Medications  Medication Dose Route Frequency Provider Last Rate Last Admin   acetaminophen (TYLENOL) tablet 650 mg  650 mg Oral Q6H PRN Eulas Post, MD   650 mg at 12/23/19 0722   alum & mag hydroxide-simeth (MAALOX/MYLANTA) 200-200-20 MG/5ML suspension 30 mL  30 mL Oral Q4H PRN Eulas Post, MD       amLODipine (NORVASC) tablet 10 mg  10 mg Oral Daily Sharma Covert, MD   10 mg at 12/23/19 1062   diphenhydrAMINE (BENADRYL) capsule 25 mg  25 mg Oral QHS PRN Clapacs, [name], MD   25 mg at 12/22/19 2132   divalproex (DEPAKOTE) DR tablet 1,000 mg  1,000 mg Oral QHS Clapacs, [name], MD   1,000 mg at 12/22/19 2133   divalproex (DEPAKOTE) DR tablet 500 mg  500 mg Oral Daily Clapacs, [name], MD   500 mg at 12/23/19 0721   guaiFENesin-dextromethorphan (ROBITUSSIN DM) 100-10 MG/5ML syrup 5 mL  5 mL Oral Q4H PRN Clapacs, Madie Reno, MD   5 mL at 12/23/19 0721   hydrOXYzine (ATARAX/VISTARIL) tablet 10 mg  10 mg Oral TID PRN Eulas Post, MD   10 mg at 12/22/19 0718   lisinopril (ZESTRIL) tablet 40 mg  40 mg Oral Daily Clapacs, Madie Reno, MD   40 mg at 12/23/19 6948   lithium carbonate (LITHOBID) CR tablet 300 mg  300 mg Oral QHS Clapacs, [name], MD   300 mg at 12/22/19 2133   magnesium hydroxide (MILK OF MAGNESIA) suspension 30 mL  30 mL Oral Daily PRN Eulas Post, MD       metoprolol succinate (TOPROL-XL) 24 hr tablet 25 mg  25 mg Oral Daily Clapacs, [name], MD   25 mg at 12/23/19 0721   temazepam (RESTORIL) capsule 30 mg  30 mg Oral QHS PRN Clapacs, Madie Reno, MD   30  mg at 12/22/19 2132   PTA Medications: No medications prior to admission.    Patient Stressors: Marital or family conflict Medication change or noncompliance  Patient Strengths: Capable of independent living Supportive family/friends  Treatment Modalities: Medication Management, Group therapy, Case management,  1 to 1 session with clinician, Psychoeducation, Recreational therapy.   Physician Treatment Plan for Primary Diagnosis: Bipolar I disorder, most recent episode (or current) manic ([place]) Long Term Goal(s): Improvement in symptoms so as ready for discharge Improvement in symptoms so as ready for discharge   Short Term Goals: Ability to verbalize feelings will improve Ability to demonstrate self-control will improve Ability to maintain clinical measurements within normal limits will improve Compliance with prescribed medications will improve  Medication Management: Evaluate patient's response, side effects, and tolerance of medication regimen.  Therapeutic Interventions: 1 to 1 sessions, Unit Group sessions and Medication administration.  Evaluation of Outcomes: Progressing  Physician Treatment Plan for Secondary Diagnosis: Principal Problem:   Bipolar I disorder, most recent episode (or current) manic ([place]) Active Problems:   Essential hypertension  Long Term Goal(s): Improvement in symptoms so as ready for discharge Improvement in symptoms so as ready for discharge   Short Term Goals: Ability to verbalize feelings will improve Ability to demonstrate self-control will improve  Ability to maintain clinical measurements within normal limits will improve Compliance with prescribed medications will improve     Medication Management: Evaluate patient's response, side effects, and tolerance of medication regimen.  Therapeutic Interventions: 1 to 1 sessions, Unit Group sessions and Medication administration.  Evaluation of Outcomes: Progressing   RN Treatment Plan for  Primary Diagnosis: Bipolar I disorder, most recent episode (or current) manic ([place]) Long Term Goal(s): Knowledge of disease and therapeutic regimen to maintain health will improve  Short Term Goals: Ability to participate in decision making will improve, Ability to verbalize feelings will improve, Ability to disclose and discuss suicidal ideas, Ability to identify and develop effective coping behaviors will improve and Compliance with prescribed medications will improve  Medication Management: RN will administer medications as ordered by provider, will assess and evaluate patient's response and provide education to patient for prescribed medication. RN will report any adverse and/or side effects to prescribing provider.  Therapeutic Interventions: 1 on 1 counseling sessions, Psychoeducation, Medication administration, Evaluate responses to treatment, Monitor vital signs and CBGs as ordered, Perform/monitor CIWA, COWS, AIMS and Fall Risk screenings as ordered, Perform wound care treatments as ordered.  Evaluation of Outcomes: Progressing   LCSW Treatment Plan for Primary Diagnosis: Bipolar I disorder, most recent episode (or current) manic ([place]) Long Term Goal(s): Safe transition to appropriate next level of care at discharge, Engage patient in therapeutic group addressing interpersonal concerns.  Short Term Goals: Engage patient in aftercare planning with referrals and resources and Increase skills for wellness and recovery  Therapeutic Interventions: Assess for all discharge needs, 1 to 1 time with Social worker, Explore available resources and support systems, Assess for adequacy in community support network, Educate family and significant other(s) on suicide prevention, Complete Psychosocial Assessment, Interpersonal group therapy.  Evaluation of Outcomes: Progressing   Progress in Treatment: Attending groups: Yes. Participating in groups: No. Taking medication as prescribed:  Yes. Toleration medication: Yes. Family/Significant other contact made: Yes, individual(s) contacted:  pt's granddaughter Patient understands diagnosis: No. Discussing patient identified problems/goals with staff: Yes. Medical problems stabilized or resolved: No. Denies suicidal/homicidal ideation: Yes. Issues/concerns per patient self-inventory: No. Other:   New problem(s) identified: No, Describe:  None  New Short Term/Long Term Goal(s): Medication stabilization, elimination of SI thoughts, and development of a comprehensive mental wellness plan.   Patient Goals:  "Regulate my medications"  Discharge Plan or Barriers: Patient will return home and follow up with outpatient treatment.   Reason for Continuation of Hospitalization: Mania Medication stabilization  Estimated Length of Stay: 3-5 days   Attendees: Patient: 12/23/2019 11:06 AM  Physician: Dr. Shirl Harris 12/23/2019 11:06 AM  Nursing: Javier Glazier, RN 12/23/2019 11:06 AM  RN Care Manager: 12/23/2019 11:06 AM  Social Worker: Ardelle Anton, LCSW  12/23/2019 11:06 AM  Recreational Therapist:  12/23/2019 11:06 AM  Other:  12/23/2019 11:06 AM  Other:  12/23/2019 11:06 AM  Other: 12/23/2019 11:06 AM    Scribe for Treatment Team: Trecia Rogers, LCSW 12/23/2019 11:06 AM

## 2019-12-23 NOTE — Plan of Care (Signed)
Patient denies SI / HI / AVH. Patient continues to be impulsive and intrusive in milieu. Patient has period of incontinence. Patient is adherent with scheduled medication. Patient reports chronic foot pain that is relieved by PRN medication. Patient's safety is maintained on the unit.    Problem: Education: Goal: Knowledge of [place] [facility] Education information/materials will improve Outcome: Not Progressing Goal: Emotional status will improve Outcome: Not Progressing Goal: Mental status will improve Outcome: Not Progressing Goal: Verbalization of understanding the information provided will improve Outcome: Not Progressing

## 2019-12-23 NOTE — Progress Notes (Signed)
[facility] MD Progress Note  12/23/2019 12:35 PM Rainee Sweatt  MRN:  [id number] Subjective: Follow-up for this 69 year old woman with bipolar disorder.  Today she seems a little confused to me.  Nursing told me she has been having some enuresis in the daytime.  Patient admitted to it.  Not complaining of any dysuria but we will check urine analysis.  I am wondering if she could possibly be getting back to being a little delirious with the increase in the Depakote dose.  Also the addition of the lithium.  She was certainly however ultimately oriented in talking with me although there were a few moments when she went so far off topic that she was making no sense to me at all.  She was redirectable but looks like she is feeling confused.  Vitals stable.  The urine analysis is not back yet. Principal Problem: Bipolar I disorder, most recent episode (or current) manic ([place]) Diagnosis: Principal Problem:   Bipolar I disorder, most recent episode (or current) manic ([place]) Active Problems:   Essential hypertension  Total Time spent with patient: 30 minutes  Past Psychiatric History: Past history of bipolar disorder  Past Medical History:  Past Medical History:  Diagnosis Date  . Bipolar 1 disorder ([place])   . Hepatitis C   . Hypertension     Past Surgical History:  Procedure Laterality Date  . APPENDECTOMY    . TOE AMPUTATION     Family History: History reviewed. No pertinent family history. Family Psychiatric  History: See previous Social History:  Social History   Substance and Sexual Activity  Alcohol Use Never     Social History   Substance and Sexual Activity  Drug Use Never    Social History   Socioeconomic History  . Marital status: Legally Separated    Spouse name: Not on file  . Number of children: Not on file  . Years of education: Not on file  . Highest education level: Not on file  Occupational History  . Not on file  Tobacco Use  . Smoking status: Never Smoker  .  Smokeless tobacco: Never Used  Substance and Sexual Activity  . Alcohol use: Never  . Drug use: Never  . Sexual activity: Not on file  Other Topics Concern  . Not on file  Social History Narrative  . Not on file   Social Determinants of Health   Financial Resource Strain:   . Difficulty of Paying Living Expenses:   Food Insecurity:   . Worried About Charity fundraiser in the Last Year:   . Arboriculturist in the Last Year:   Transportation Needs:   . Film/video editor (Medical):   Marland Kitchen Lack of Transportation (Non-Medical):   Physical Activity:   . Days of Exercise per Week:   . Minutes of Exercise per Session:   Stress:   . Feeling of Stress :   Social Connections:   . Frequency of Communication with Friends and Family:   . Frequency of Social Gatherings with Friends and Family:   . Attends Religious Services:   . Active Member of Clubs or Organizations:   . Attends Archivist Meetings:   Marland Kitchen Marital Status:    Additional Social History:                         Sleep: Fair  Appetite:  Fair  Current Medications: Current Facility-Administered Medications  Medication Dose Route Frequency Provider  Last Rate Last Admin  . acetaminophen (TYLENOL) tablet 650 mg  650 mg Oral Q6H PRN Eulas Post, MD   650 mg at 12/23/19 5329  . alum & mag hydroxide-simeth (MAALOX/MYLANTA) 200-200-20 MG/5ML suspension 30 mL  30 mL Oral Q4H PRN Eulas Post, MD      . amLODipine (NORVASC) tablet 10 mg  10 mg Oral Daily Sharma Covert, MD   10 mg at 12/23/19 0721  . diphenhydrAMINE (BENADRYL) capsule 25 mg  25 mg Oral QHS PRN Arabia Nylund, Madie Reno, MD   25 mg at 12/22/19 2132  . divalproex (DEPAKOTE) DR tablet 1,000 mg  1,000 mg Oral QHS Rondell Frick T, MD   1,000 mg at 12/22/19 2133  . divalproex (DEPAKOTE) DR tablet 500 mg  500 mg Oral Daily Alexzander Dolinger, Madie Reno, MD   500 mg at 12/23/19 0721  . guaiFENesin-dextromethorphan (ROBITUSSIN DM) 100-10 MG/5ML syrup 5 mL  5 mL  Oral Q4H PRN Sofia Vanmeter, Madie Reno, MD   5 mL at 12/23/19 0721  . hydrOXYzine (ATARAX/VISTARIL) tablet 10 mg  10 mg Oral TID PRN Eulas Post, MD   10 mg at 12/22/19 9242  . lisinopril (ZESTRIL) tablet 40 mg  40 mg Oral Daily Labresha Mellor, Madie Reno, MD   40 mg at 12/23/19 0721  . lithium carbonate (LITHOBID) CR tablet 300 mg  300 mg Oral QHS Daniell Paradise T, MD   300 mg at 12/22/19 2133  . magnesium hydroxide (MILK OF MAGNESIA) suspension 30 mL  30 mL Oral Daily PRN Eulas Post, MD      . metoprolol succinate (TOPROL-XL) 24 hr tablet 25 mg  25 mg Oral Daily Daquana Paddock, Madie Reno, MD   25 mg at 12/23/19 0721  . temazepam (RESTORIL) capsule 30 mg  30 mg Oral QHS PRN Toyna Erisman, Madie Reno, MD   30 mg at 12/22/19 2132    Lab Results: No results found for this or any previous visit (from the past 48 hour(s)).  Blood Alcohol level:  Lab Results  Component Value Date   ETH <10 68/34/1962    Metabolic Disorder Labs: Lab Results  Component Value Date   HGBA1C 6.3 (H) 12/14/2019   MPG 134.11 12/14/2019   No results found for: PROLACTIN No results found for: CHOL, TRIG, HDL, CHOLHDL, VLDL, LDLCALC  Physical Findings: AIMS:  , ,  ,  ,    CIWA:    COWS:     Musculoskeletal: Strength & Muscle Tone: within normal limits Gait & Station: normal Patient leans: N/A  Psychiatric Specialty Exam: Physical Exam  Nursing note and vitals reviewed. Constitutional: She appears well-developed and well-nourished.  HENT:  Head: Normocephalic and atraumatic.  Eyes: Pupils are equal, round, and reactive to light. Conjunctivae are normal.  Cardiovascular: Regular rhythm and normal heart sounds.  Respiratory: Effort normal.  GI: Soft.  Musculoskeletal:        General: Normal range of motion.     Cervical back: Normal range of motion.  Neurological: She is alert.  Skin: Skin is warm and dry.  Psychiatric: Her affect is blunt. Her speech is delayed. She is slowed. Thought content is paranoid. Cognition and memory are  impaired. She expresses impulsivity. She expresses no homicidal and no suicidal ideation.    Review of Systems  Constitutional: Negative.   HENT: Negative.   Eyes: Negative.   Respiratory: Negative.   Cardiovascular: Negative.   Gastrointestinal: Negative.   Genitourinary: Positive for enuresis and frequency. Negative for dysuria.  Musculoskeletal: Negative.  Skin: Negative.   Neurological: Negative.   Psychiatric/Behavioral: Positive for confusion.    Blood pressure (!) 160/91, pulse 78, temperature 97.7 F (36.5 C), temperature source Oral, resp. rate 18, height 5' 4.5" (1.638 m), weight 67.1 kg, SpO2 100 %.Body mass index is 25.01 kg/m.  General Appearance: Casual  Eye Contact:  Fair  Speech:  Garbled  Volume:  Decreased  Mood:  Euthymic  Affect:  Constricted  Thought Process:  Disorganized  Orientation:  Full (Time, Place, and Person)  Thought Content:  Rumination and Tangential  Suicidal Thoughts:  No  Homicidal Thoughts:  No  Memory:  Immediate;   Fair Recent;   Fair Remote;   Fair  Judgement:  Fair  Insight:  Fair  Psychomotor Activity:  Decreased  Concentration:  Concentration: Fair  Recall:  [employer] of Knowledge:  Fair  Language:  Fair  Akathisia:  No  Handed:  Right  AIMS (if indicated):     Assets:  Desire for Improvement  ADL's:  Intact  Cognition:  Impaired,  Mild  Sleep:  Number of Hours: 3.25     Treatment Plan Summary: Daily contact with patient to assess and evaluate symptoms and progress in treatment, Medication management and Plan Urinalysis pending.  If that came back infected it would be an explanation for the confusion as well as the enuresis.  If she does not however have a urinary tract infection I will be a little worried that maybe she really cannot tolerate even the tiny dose of lithium.  Even though it is early I will go ahead and recheck some blood levels tomorrow.  Patient is also requesting Ensure with meals which is fine with  me.  Alethia Berthold, MD 12/23/2019, 12:35 PM

## 2019-12-24 DIAGNOSIS — F3110 Bipolar disorder, current episode manic without psychotic features, unspecified: Secondary | ICD-10-CM | POA: Diagnosis not present

## 2019-12-24 LAB — LITHIUM LEVEL: Lithium Lvl: 0.33 mmol/L — ABNORMAL LOW (ref 0.60–1.20)

## 2019-12-24 LAB — VALPROIC ACID LEVEL: Valproic Acid Lvl: 124 ug/mL — ABNORMAL HIGH (ref 50.0–100.0)

## 2019-12-24 NOTE — Progress Notes (Signed)
Cooperative with treatment, medication compliant, she spent most of the evening in the dayroom with peers, she was looking for her [employer] shorts at shift change but they were later found in the dryer accidentally with another patient's clothes . She appears to be in bed resting quietly at this time.

## 2019-12-24 NOTE — Progress Notes (Signed)
[facility] MD Progress Note  12/24/2019 11:34 AM Shelby Jordan  MRN:  [id number] Subjective: Follow-up 69 year old woman with a history of bipolar disorder.  Patient continues to be hyperverbal and tangential.  Not as confused as before.  She wants me to call her granddaughter in [place] but then tells me that person is 69 years old.  There does not seem to be any specific reason for calling them.  Patient is charted as sleeping only 2 to 3 hours but she claims she slept a lot more than that.  Depakote and lithium levels were checked.  Lithium level 0.33 Depakote 124.  Slightly high Depakote level but seems to be tolerated.  Given risk of confusion I will not push up the lithium level right now. Principal Problem: Bipolar I disorder, most recent episode (or current) manic ([place]) Diagnosis: Principal Problem:   Bipolar I disorder, most recent episode (or current) manic ([place]) Active Problems:   Essential hypertension  Total Time spent with patient: 30 minutes  Past Psychiatric History: Past history of recurrent episodes of bipolar mania  Past Medical History:  Past Medical History:  Diagnosis Date  . Bipolar 1 disorder ([place])   . Hepatitis C   . Hypertension     Past Surgical History:  Procedure Laterality Date  . APPENDECTOMY    . TOE AMPUTATION     Family History: History reviewed. No pertinent family history. Family Psychiatric  History: See previous Social History:  Social History   Substance and Sexual Activity  Alcohol Use Never     Social History   Substance and Sexual Activity  Drug Use Never    Social History   Socioeconomic History  . Marital status: Legally Separated    Spouse name: Not on file  . Number of children: Not on file  . Years of education: Not on file  . Highest education level: Not on file  Occupational History  . Not on file  Tobacco Use  . Smoking status: Never Smoker  . Smokeless tobacco: Never Used  Substance and Sexual Activity  . Alcohol use:  Never  . Drug use: Never  . Sexual activity: Not on file  Other Topics Concern  . Not on file  Social History Narrative  . Not on file   Social Determinants of Health   Financial Resource Strain:   . Difficulty of Paying Living Expenses:   Food Insecurity:   . Worried About Charity fundraiser in the Last Year:   . Arboriculturist in the Last Year:   Transportation Needs:   . Film/video editor (Medical):   Marland Kitchen Lack of Transportation (Non-Medical):   Physical Activity:   . Days of Exercise per Week:   . Minutes of Exercise per Session:   Stress:   . Feeling of Stress :   Social Connections:   . Frequency of Communication with Friends and Family:   . Frequency of Social Gatherings with Friends and Family:   . Attends Religious Services:   . Active Member of Clubs or Organizations:   . Attends Archivist Meetings:   Marland Kitchen Marital Status:    Additional Social History:                         Sleep: Fair  Appetite:  Fair  Current Medications: Current Facility-Administered Medications  Medication Dose Route Frequency Provider Last Rate Last Admin  . acetaminophen (TYLENOL) tablet 650 mg  650 mg Oral  Q6H PRN Eulas Post, MD   650 mg at 12/24/19 0810  . alum & mag hydroxide-simeth (MAALOX/MYLANTA) 200-200-20 MG/5ML suspension 30 mL  30 mL Oral Q4H PRN Eulas Post, MD      . amLODipine (NORVASC) tablet 10 mg  10 mg Oral Daily Sharma Covert, MD   10 mg at 12/24/19 0806  . diphenhydrAMINE (BENADRYL) capsule 25 mg  25 mg Oral QHS PRN Leonore Frankson, Madie Reno, MD   25 mg at 12/22/19 2132  . divalproex (DEPAKOTE) DR tablet 1,000 mg  1,000 mg Oral QHS Kaylina Cahue T, MD   1,000 mg at 12/23/19 2135  . divalproex (DEPAKOTE) DR tablet 500 mg  500 mg Oral Daily Kayl Stogdill, Madie Reno, MD   500 mg at 12/24/19 0807  . feeding supplement (ENSURE ENLIVE) (ENSURE ENLIVE) liquid 237 mL  237 mL Oral BID BM Sharri Loya T, MD   237 mL at 12/23/19 1446  .  guaiFENesin-dextromethorphan (ROBITUSSIN DM) 100-10 MG/5ML syrup 5 mL  5 mL Oral Q4H PRN Bryor Rami, Madie Reno, MD   5 mL at 12/23/19 0721  . hydrOXYzine (ATARAX/VISTARIL) tablet 10 mg  10 mg Oral TID PRN Eulas Post, MD   10 mg at 12/22/19 0272  . lisinopril (ZESTRIL) tablet 40 mg  40 mg Oral Daily Haifa Hatton, Madie Reno, MD   40 mg at 12/24/19 0807  . lithium carbonate (LITHOBID) CR tablet 300 mg  300 mg Oral QHS Nilani Hugill T, MD   300 mg at 12/23/19 2135  . magnesium hydroxide (MILK OF MAGNESIA) suspension 30 mL  30 mL Oral Daily PRN Eulas Post, MD      . metoprolol succinate (TOPROL-XL) 24 hr tablet 25 mg  25 mg Oral Daily Kataleia Quaranta, Madie Reno, MD   25 mg at 12/24/19 0806  . temazepam (RESTORIL) capsule 30 mg  30 mg Oral QHS PRN Chailyn Racette, Madie Reno, MD   30 mg at 12/22/19 2132    Lab Results:  Results for orders placed or performed during the hospital encounter of 12/14/19 (from the past 48 hour(s))  Urinalysis, Complete w Microscopic     Status: Abnormal   Collection Time: 12/23/19 11:02 AM  Result Value Ref Range   Color, Urine YELLOW (A) YELLOW   APPearance CLEAR (A) CLEAR   Specific Gravity, Urine 1.013 1.005 - 1.030   pH 6.0 5.0 - 8.0   Glucose, UA NEGATIVE NEGATIVE mg/dL   Hgb urine dipstick NEGATIVE NEGATIVE   Bilirubin Urine NEGATIVE NEGATIVE   Ketones, ur NEGATIVE NEGATIVE mg/dL   Protein, ur NEGATIVE NEGATIVE mg/dL   Nitrite NEGATIVE NEGATIVE   Leukocytes,Ua NEGATIVE NEGATIVE   RBC / HPF 0-5 0 - 5 RBC/hpf   WBC, UA 0-5 0 - 5 WBC/hpf   Bacteria, UA NONE SEEN NONE SEEN   Squamous Epithelial / LPF 0-5 0 - 5   Mucus PRESENT     Comment: Performed at [facility], [address] [place], [place] [address]  Lithium level     Status: Abnormal   Collection Time: 12/24/19  8:01 AM  Result Value Ref Range   Lithium Lvl 0.33 (L) 0.60 - 1.20 mmol/L    Comment: Performed at [facility], [place], [place] [address]  Valproic acid level     Status:  Abnormal   Collection Time: 12/24/19  8:01 AM  Result Value Ref Range   Valproic Acid Lvl 124 (H) 50.0 - 100.0 ug/mL    Comment: Performed at [facility], [place], [place] [address]    Blood Alcohol level:  Lab Results  Component Value Date   ETH <10 37/04/6268    Metabolic Disorder Labs: Lab Results  Component Value Date   HGBA1C 6.3 (H) 12/14/2019   MPG 134.11 12/14/2019   No results found for: PROLACTIN No results found for: CHOL, TRIG, HDL, CHOLHDL, VLDL, LDLCALC  Physical Findings: AIMS:  , ,  ,  ,    CIWA:    COWS:     Musculoskeletal: Strength & Muscle Tone: within normal limits Gait & Station: normal Patient leans: N/A  Psychiatric Specialty Exam: Physical Exam  Nursing note and vitals reviewed. Constitutional: She appears well-developed and well-nourished.  HENT:  Head: Normocephalic and atraumatic.  Eyes: Pupils are equal, round, and reactive to light. Conjunctivae are normal.  Cardiovascular: Regular rhythm and normal heart sounds.  Respiratory: Effort normal.  GI: Soft.  Musculoskeletal:        General: Normal range of motion.     Cervical back: Normal range of motion.  Neurological: She is alert.  Skin: Skin is warm and dry.  Psychiatric: Her affect is labile. Her speech is rapid and/or pressured and tangential. She is agitated. She is not aggressive. Thought content is not paranoid. She expresses impulsivity. She expresses no homicidal and no suicidal ideation. She exhibits abnormal recent memory.    Review of Systems  Constitutional: Negative.   HENT: Negative.   Eyes: Negative.   Respiratory: Negative.   Cardiovascular: Negative.   Gastrointestinal: Negative.   Musculoskeletal: Negative.   Skin: Negative.   Neurological: Negative.   Psychiatric/Behavioral: The patient is nervous/anxious.     Blood pressure (!) 145/78, pulse 73, temperature 98.6 F (37 C), temperature source Oral, resp. rate 18, height 5' 4.5"  (1.638 m), weight 67.1 kg, SpO2 100 %.Body mass index is 25.01 kg/m.  General Appearance: Casual  Eye Contact:  Good  Speech:  Clear and Coherent  Volume:  Normal  Mood:  Euthymic  Affect:  Congruent  Thought Process:  Goal Directed  Orientation:  Full (Time, Place, and Person)  Thought Content:  Logical  Suicidal Thoughts:  No  Homicidal Thoughts:  No  Memory:  Immediate;   Fair Recent;   Fair Remote;   Fair  Judgement:  Fair  Insight:  Fair  Psychomotor Activity:  Normal  Concentration:  Concentration: Fair  Recall:  [employer] of Knowledge:  Fair  Language:  Fair  Akathisia:  No  Handed:  Right  AIMS (if indicated):     Assets:  Desire for Improvement  ADL's:  Intact  Cognition:  WNL  Sleep:  Number of Hours: 2.5     Treatment Plan Summary: Plan Continue medication.  Patient's urine analysis came back normal as well.  Does not seem to have an infection and there were no new reports of enuresis.  Encourage group attendance.  We will give it a couple more days and see how things settle out with her current medicine doses  Alethia Berthold, MD 12/24/2019, 11:34 AM

## 2019-12-24 NOTE — Plan of Care (Signed)
D- Patient alert and oriented. Patient presented in a pleasant mood on assessment stating that she slept "fairly well" last night, however, it was reported that patient only slept two hours. Patient has been up all night wanting to talk to staff. Patient continues to complain of left-sided pain, in which she did request pain medication from this Probation officer. Patient denied anxiety, however, she endorsed "a little" depression, "I'm still sitting here". Patient also denied SI, HI, AVH at this time. Patient had no stated goals for today.  A- Scheduled medications administered to patient, per MD orders. Support and encouragement provided.  Routine safety checks conducted every 15 minutes.  Patient informed to notify staff with problems or concerns.  R- No adverse drug reactions noted. Patient contracts for safety at this time. Patient compliant with medications and treatment plan. Patient receptive, calm, and cooperative. Patient interacts well with others on the unit.  Patient remains safe at this time.  Problem: Education: Goal: Knowledge of [place] [facility] Education information/materials will improve Outcome: Not Progressing Goal: Emotional status will improve Outcome: Not Progressing Goal: Mental status will improve Outcome: Not Progressing Goal: Verbalization of understanding the information provided will improve Outcome: Not Progressing   Problem: Activity: Goal: Interest or engagement in activities will improve Outcome: Not Progressing Goal: Sleeping patterns will improve Outcome: Not Progressing   Problem: Coping: Goal: Ability to verbalize frustrations and anger appropriately will improve Outcome: Not Progressing Goal: Ability to demonstrate self-control will improve Outcome: Not Progressing   Problem: Health Behavior/Discharge Planning: Goal: Identification of resources available to assist in meeting health care needs will improve Outcome: Not Progressing Goal: Compliance with  treatment plan for underlying cause of condition will improve Outcome: Not Progressing   Problem: Physical Regulation: Goal: Ability to maintain clinical measurements within normal limits will improve Outcome: Not Progressing   Problem: Safety: Goal: Periods of time without injury will increase Outcome: Not Progressing   Problem: Activity: Goal: Will verbalize the importance of balancing activity with adequate rest periods Outcome: Not Progressing   Problem: Education: Goal: Will be free of psychotic symptoms Outcome: Not Progressing Goal: Knowledge of the prescribed therapeutic regimen will improve Outcome: Not Progressing   Problem: Coping: Goal: Coping ability will improve Outcome: Not Progressing Goal: Will verbalize feelings Outcome: Not Progressing   Problem: Health Behavior/Discharge Planning: Goal: Compliance with prescribed medication regimen will improve Outcome: Not Progressing   Problem: Nutritional: Goal: Ability to achieve adequate nutritional intake will improve Outcome: Not Progressing   Problem: Role Relationship: Goal: Ability to communicate needs accurately will improve Outcome: Not Progressing Goal: Ability to interact with others will improve Outcome: Not Progressing   Problem: Safety: Goal: Ability to redirect hostility and anger into socially appropriate behaviors will improve Outcome: Not Progressing Goal: Ability to remain free from injury will improve Outcome: Not Progressing   Problem: Self-Care: Goal: Ability to participate in self-care as condition permits will improve Outcome: Not Progressing   Problem: Self-Concept: Goal: Will verbalize positive feelings about self Outcome: Not Progressing

## 2019-12-24 NOTE — B[facility] Group Notes (Signed)
LCSW Aftercare Discharge Planning Group Note  12/24/2019  Type of Group and Topic: Psychoeducational Group: Discharge Planning  Participation Level: Did Not Attend  Description of Group  Discharge planning group reviews patient's anticipated discharge plans and assists patients to anticipate and address any barriers to wellness/recovery in the community. Suicide prevention education is reviewed with patients in group.  Therapeutic Goals  1. Patients will state their anticipated discharge plan and mental health aftercare  2. Patients will identify potential barriers to wellness in the community setting  3. Patients will engage in problem solving, solution focused discussion of ways to anticipate and address barriers to wellness/recovery  Summary of Patient Progress  Plan for Discharge/Comments:  Transportation Means:  Supports:  Therapeutic Modalities:  [place], [place]  12/24/2019 10:13 AM

## 2019-12-25 DIAGNOSIS — F3110 Bipolar disorder, current episode manic without psychotic features, unspecified: Secondary | ICD-10-CM | POA: Diagnosis not present

## 2019-12-25 NOTE — Progress Notes (Signed)
Patient continues to be intrusive at times throughout shift, more pleasant. Denies any SI, HI, AVH. Did take medications this evening. Requested benadryl for allergies and sleep. Medication effective. Patient noted sleeping more this shift.  Encouragement and support provided. Safety checks maintained. Medications given as prescribed. Pt receptive and remains safe on unit with q 15 min checks.

## 2019-12-25 NOTE — Progress Notes (Signed)
   12/25/19 1510  Clinical Encounter Type  Visited With Patient  Visit Type Follow-up;Spiritual support;Social support;Behavioral Health  Referral From Chaplain  Consult/Referral To Chaplain  Visited with Pt while rounding unit in [facility]. Pt was happy to see Ch. I talk to Pt for a little while and invited Pt to group tomorrow. Ch will follow-up.

## 2019-12-25 NOTE — Progress Notes (Signed)
[facility] MD Progress Note  12/25/2019 4:30 PM Shelby Jordan  MRN:  [id number] Subjective: Patient seen chart reviewed.  Patient seemed I think to be a little calm her and better put together today.  She still gets tangential when talking but keeps it together a little better than she had the last couple times.  No psychosis no threats.  No new physical complaints Principal Problem: Bipolar I disorder, most recent episode (or current) manic ([place]) Diagnosis: Principal Problem:   Bipolar I disorder, most recent episode (or current) manic ([place]) Active Problems:   Essential hypertension  Total Time spent with patient: 30 minutes  Past Psychiatric History: Past history of recurrent bipolar disorder  Past Medical History:  Past Medical History:  Diagnosis Date   Bipolar 1 disorder ([place])    Hepatitis C    Hypertension     Past Surgical History:  Procedure Laterality Date   APPENDECTOMY     TOE AMPUTATION     Family History: History reviewed. No pertinent family history. Family Psychiatric  History: See previous Social History:  Social History   Substance and Sexual Activity  Alcohol Use Never     Social History   Substance and Sexual Activity  Drug Use Never    Social History   Socioeconomic History   Marital status: Legally Separated    Spouse name: Not on file   Number of children: Not on file   Years of education: Not on file   Highest education level: Not on file  Occupational History   Not on file  Tobacco Use   Smoking status: Never Smoker   Smokeless tobacco: Never Used  Substance and Sexual Activity   Alcohol use: Never   Drug use: Never   Sexual activity: Not on file  Other Topics Concern   Not on file  Social History Narrative   Not on file   Social Determinants of Health   Financial Resource Strain:    Difficulty of Paying Living Expenses:   Food Insecurity:    Worried About Charity fundraiser in the Last Year:    Arboriculturist  in the Last Year:   Transportation Needs:    Film/video editor (Medical):    Lack of Transportation (Non-Medical):   Physical Activity:    Days of Exercise per Week:    Minutes of Exercise per Session:   Stress:    Feeling of Stress :   Social Connections:    Frequency of Communication with Friends and Family:    Frequency of Social Gatherings with Friends and Family:    Attends Religious Services:    Active Member of Clubs or Organizations:    Attends Archivist Meetings:    Marital Status:    Additional Social History:                         Sleep: Fair  Appetite:  Fair  Current Medications: Current Facility-Administered Medications  Medication Dose Route Frequency Provider Last Rate Last Admin   acetaminophen (TYLENOL) tablet 650 mg  650 mg Oral Q6H PRN Eulas Post, MD   650 mg at 12/25/19 1406   alum & mag hydroxide-simeth (MAALOX/MYLANTA) 200-200-20 MG/5ML suspension 30 mL  30 mL Oral Q4H PRN Eulas Post, MD       amLODipine (NORVASC) tablet 10 mg  10 mg Oral Daily Sharma Covert, MD   10 mg at 12/25/19 0805   diphenhydrAMINE (BENADRYL) capsule 25  mg  25 mg Oral QHS PRN Deklyn Trachtenberg, Madie Reno, MD   25 mg at 12/24/19 2145   divalproex (DEPAKOTE) DR tablet 1,000 mg  1,000 mg Oral QHS Finola Rosal T, MD   1,000 mg at 12/24/19 2145   divalproex (DEPAKOTE) DR tablet 500 mg  500 mg Oral Daily Cathyrn Deas T, MD   500 mg at 12/25/19 0806   feeding supplement (ENSURE ENLIVE) (ENSURE ENLIVE) liquid 237 mL  237 mL Oral BID BM Ritaj Dullea T, MD   237 mL at 12/25/19 1100   guaiFENesin-dextromethorphan (ROBITUSSIN DM) 100-10 MG/5ML syrup 5 mL  5 mL Oral Q4H PRN Tillie Viverette T, MD   5 mL at 12/25/19 1251   hydrOXYzine (ATARAX/VISTARIL) tablet 10 mg  10 mg Oral TID PRN Eulas Post, MD   10 mg at 12/22/19 0718   lisinopril (ZESTRIL) tablet 40 mg  40 mg Oral Daily Paulene Tayag T, MD   40 mg at 12/25/19 0806   lithium carbonate  (LITHOBID) CR tablet 300 mg  300 mg Oral QHS Kynesha Guerin T, MD   300 mg at 12/24/19 2145   magnesium hydroxide (MILK OF MAGNESIA) suspension 30 mL  30 mL Oral Daily PRN Eulas Post, MD       metoprolol succinate (TOPROL-XL) 24 hr tablet 25 mg  25 mg Oral Daily Haley Roza T, MD   25 mg at 12/25/19 0806   temazepam (RESTORIL) capsule 30 mg  30 mg Oral QHS PRN Deloras Reichard, Madie Reno, MD   30 mg at 12/22/19 2132    Lab Results:  Results for orders placed or performed during the hospital encounter of 12/14/19 (from the past 48 hour(s))  Lithium level     Status: Abnormal   Collection Time: 12/24/19  8:01 AM  Result Value Ref Range   Lithium Lvl 0.33 (L) 0.60 - 1.20 mmol/L    Comment: Performed at [facility], [address] [place], [place] [address]  Valproic acid level     Status: Abnormal   Collection Time: 12/24/19  8:01 AM  Result Value Ref Range   Valproic Acid Lvl 124 (H) 50.0 - 100.0 ug/mL    Comment: Performed at [facility], [address] [place], [place] [address]    Blood Alcohol level:  Lab Results  Component Value Date   [facility] <10 60/63/0160    Metabolic Disorder Labs: Lab Results  Component Value Date   HGBA1C 6.3 (H) 12/14/2019   MPG 134.11 12/14/2019   No results found for: PROLACTIN No results found for: CHOL, TRIG, HDL, CHOLHDL, VLDL, LDLCALC  Physical Findings: AIMS:  , ,  ,  ,    CIWA:    COWS:     Musculoskeletal: Strength & Muscle Tone: within normal limits Gait & Station: normal Patient leans: N/A  Psychiatric Specialty Exam: Physical Exam  Nursing note and vitals reviewed. Constitutional: She appears well-developed and well-nourished.  HENT:  Head: Normocephalic and atraumatic.  Eyes: Pupils are equal, round, and reactive to light. Conjunctivae are normal.  Cardiovascular: Regular rhythm and normal heart sounds.  Respiratory: Effort normal. No respiratory distress.  GI: Soft.  Musculoskeletal:        General:  Normal range of motion.     Cervical back: Normal range of motion.  Neurological: She is alert.  Skin: Skin is warm and dry.  Psychiatric: Her mood appears anxious. Her speech is tangential.    Review of Systems  Constitutional: Negative.   HENT: Negative.  Eyes: Negative.   Respiratory: Negative.   Cardiovascular: Negative.   Gastrointestinal: Negative.   Musculoskeletal: Negative.   Skin: Negative.   Neurological: Negative.   Psychiatric/Behavioral: The patient is nervous/anxious.     Blood pressure (!) 157/93, pulse 81, temperature 98.5 F (36.9 C), temperature source Oral, resp. rate 17, height 5' 4.5" (1.638 m), weight 67.1 kg, SpO2 100 %.Body mass index is 25.01 kg/m.  General Appearance: Casual  Eye Contact:  Fair  Speech:  Blocked  Volume:  Normal  Mood:  Euthymic  Affect:  Congruent  Thought Process:  Disorganized  Orientation:  Full (Time, Place, and Person)  Thought Content:  Rumination  Suicidal Thoughts:  No  Homicidal Thoughts:  No  Memory:  Immediate;   Fair Recent;   Fair Remote;   Fair  Judgement:  Fair  Insight:  Fair  Psychomotor Activity:  Restlessness  Concentration:  Concentration: Fair  Recall:  [employer] of Knowledge:  Fair  Language:  Fair  Akathisia:  No  Handed:  Right  AIMS (if indicated):     Assets:  Desire for Improvement  ADL's:  Intact  Cognition:  WNL  Sleep:  Number of Hours: 6.75     Treatment Plan Summary: Daily contact with patient to assess and evaluate symptoms and progress in treatment, Medication management and Plan Really seems to be doing better.  No delirium.  Physically looks better today.  I think we will continue current medicine and hope that we can look towards discharge in 1 to 2 days.  No sign of current dangerousness.  Alethia Berthold, MD 12/25/2019, 4:30 PM

## 2019-12-25 NOTE — Plan of Care (Signed)
°  Problem: Education: Goal: Emotional status will improve Outcome: Progressing   Problem: Activity: Goal: Sleeping patterns will improve Outcome: Progressing   Problem: Safety: Goal: Periods of time without injury will increase Outcome: Progressing   

## 2019-12-25 NOTE — Plan of Care (Signed)
D- Patient alert and oriented. Patient presented in a pleasant mood on assessment stating that she slept "very-well" last night, in which it was reported that she slept almost seven hours. Patient continues to endorse left-sided pain, in which she did request pain medication from this Probation officer. Patient states that the Tylenol doesn't do anything for her pain, however, "it's better than nothing". Patient denied SI, HI, AVH at this time. Patient also denied any sign/symptoms of depression/anxiety, stating "I'm cool, pretty leveled off, I'm ready to go home". Patient had no stated goals for today.  A- Scheduled medications administered to patient, per MD orders. Support and encouragement provided.  Routine safety checks conducted every 15 minutes.  Patient informed to notify staff with problems or concerns.  R- No adverse drug reactions noted. Patient contracts for safety at this time. Patient compliant with medications and treatment plan. Patient receptive, calm, and cooperative. Patient interacts well with others on the unit.  Patient remains safe at this time.  Problem: Education: Goal: Knowledge of [place] [facility] Education information/materials will improve Outcome: Progressing Goal: Emotional status will improve Outcome: Progressing Goal: Mental status will improve Outcome: Progressing Goal: Verbalization of understanding the information provided will improve Outcome: Progressing   Problem: Activity: Goal: Interest or engagement in activities will improve Outcome: Progressing Goal: Sleeping patterns will improve Outcome: Progressing   Problem: Coping: Goal: Ability to verbalize frustrations and anger appropriately will improve Outcome: Progressing Goal: Ability to demonstrate self-control will improve Outcome: Progressing   Problem: Health Behavior/Discharge Planning: Goal: Identification of resources available to assist in meeting health care needs will improve Outcome:  Progressing Goal: Compliance with treatment plan for underlying cause of condition will improve Outcome: Progressing   Problem: Physical Regulation: Goal: Ability to maintain clinical measurements within normal limits will improve Outcome: Progressing   Problem: Safety: Goal: Periods of time without injury will increase Outcome: Progressing   Problem: Activity: Goal: Will verbalize the importance of balancing activity with adequate rest periods Outcome: Progressing   Problem: Education: Goal: Will be free of psychotic symptoms Outcome: Progressing Goal: Knowledge of the prescribed therapeutic regimen will improve Outcome: Progressing   Problem: Coping: Goal: Coping ability will improve Outcome: Progressing Goal: Will verbalize feelings Outcome: Progressing   Problem: Health Behavior/Discharge Planning: Goal: Compliance with prescribed medication regimen will improve Outcome: Progressing   Problem: Nutritional: Goal: Ability to achieve adequate nutritional intake will improve Outcome: Progressing   Problem: Role Relationship: Goal: Ability to communicate needs accurately will improve Outcome: Progressing Goal: Ability to interact with others will improve Outcome: Progressing   Problem: Safety: Goal: Ability to redirect hostility and anger into socially appropriate behaviors will improve Outcome: Progressing Goal: Ability to remain free from injury will improve Outcome: Progressing   Problem: Self-Care: Goal: Ability to participate in self-care as condition permits will improve Outcome: Progressing   Problem: Self-Concept: Goal: Will verbalize positive feelings about self Outcome: Progressing

## 2019-12-25 NOTE — BHH Group Notes (Signed)
[facility] Group Notes:  (Nursing/MHT/Case Management/Adjunct)  Date:  12/25/2019  Time:  9:29 PM  Type of Therapy:  Group Therapy  Participation Level:  Active  Participation Quality:  Sharing  Affect:  Appropriate  Cognitive:  Alert  Insight:  Limited  Engagement in Group:  Engaged and said today not a bad day.  Modes of Intervention:  Support  Summary of Progress/Problems:  Nehemiah Settle 12/25/2019, 9:29 PM

## 2019-12-25 NOTE — Progress Notes (Signed)
Recreation Therapy Notes   Date: 12/25/2019  Time: 9:30 am  Location: Craft room  Behavioral response: Appropriate  Intervention Topic: Goals   Discussion/Intervention:  Group content on today was focused on goals. Patients described what goals are and how they define goals. Individuals expressed how they go about setting goals and reaching them. The group identified how important goals are and if they make short term goals to reach long term goals. Patients described how many goals they work on at a time and what affects them not reaching their goal. Individuals described how much time they put into planning and obtaining their goals. The group participated in the intervention "My Goal Board" and made personal goal boards to help them achieve their goal. Clinical Observations/Feedback:  Patient came to group and defined goals as succeeding. She stated that her goal is to lose weight and educate her self. Individual was social with peers and staff while participating on the intervention.  Ronte Parker LRT/CTRS         Ramon Zanders 12/25/2019 12:35 PM

## 2019-12-25 NOTE — BHH Group Notes (Signed)
Emotional Regulation 12/25/2019 9:30AM/1PM  Type of Therapy/Topic:  Group Therapy:  Emotion Regulation  Participation Level:  Minimal   Description of Group:   The purpose of this group is to assist patients in learning to regulate negative emotions and experience positive emotions. Patients will be guided to discuss ways in which they have been vulnerable to their negative emotions. These vulnerabilities will be juxtaposed with experiences of positive emotions or situations, and patients will be challenged to use positive emotions to combat negative ones. Special emphasis will be placed on coping with negative emotions in conflict situations, and patients will process healthy conflict resolution skills.  Therapeutic Goals: 1. Patient will identify two positive emotions or experiences to reflect on in order to balance out negative emotions 2. Patient will label two or more emotions that they find the most difficult to experience 3. Patient will demonstrate positive conflict resolution skills through discussion and/or role plays  Summary of Patient Progress: Minimal participation during group session. Patient left group early and did not return.   Therapeutic Modalities:   Cognitive Behavioral Therapy Feelings Identification Dialectical Behavioral Therapy   Yvette Rack, LCSW 12/25/2019 2:35 PM

## 2019-12-26 DIAGNOSIS — F3110 Bipolar disorder, current episode manic without psychotic features, unspecified: Secondary | ICD-10-CM | POA: Diagnosis not present

## 2019-12-26 MED ORDER — PNEUMOCOCCAL VAC POLYVALENT 25 MCG/0.5ML IJ INJ
0.5000 mL | INJECTION | INTRAMUSCULAR | Status: DC
  Filled 2019-12-26: qty 0.5

## 2019-12-26 NOTE — Progress Notes (Signed)
Recreation Therapy Notes  Date: 12/26/2019  Time: 9:30 am   Location: Craft room   Behavioral response: N/A   Intervention Topic: Stress Management   Discussion/Intervention: Patient did not attend group.   Clinical Observations/Feedback:  Patient did not attend group.   Aren Pryde LRT/CTRS         Earlena Werst 12/26/2019 12:09 PM

## 2019-12-26 NOTE — Progress Notes (Signed)
Patient was pleasant during assessment denying SI/HI/AVH, pain, anxiety and depression with this Probation officer. Patient presents at what this writer assesses as her baseline. Patient compliant with medication administration per MD orders. Patient observed by this Probation officer interacting appropriately with staff and peers on the unit. Patient given education, support and encouragement to be active in her treatment plan. Patient being monitored Q 15 minutes for safety per unit protocol. Patient remains safe on the unit.

## 2019-12-26 NOTE — Plan of Care (Signed)
D- Patient alert and oriented. Patient presents in a restless, but pleasant mood on assessment stating that she slept good last night and continues to have complaints of left toe/leg pain, in which she requests pain medication, however, states that Tylenol doesn't do anything. Patient denies SI, HI, AVH at this time. Patient also denies depression/anxiety, stating that "I just want to get up out of here". Patient's goal for today is "to make it clear, I'm not on drugs".  A- Scheduled medications administered to patient, per MD orders. Support and encouragement provided.  Routine safety checks conducted every 15 minutes.  Patient informed to notify staff with problems or concerns.  R- No adverse drug reactions noted. Patient contracts for safety at this time. Patient compliant with medications and treatment plan. Patient receptive, calm, and cooperative. Patient interacts well with others on the unit.  Patient remains safe at this time.  Problem: Education: Goal: Knowledge of [place] [facility] Education information/materials will improve Outcome: Progressing Goal: Emotional status will improve Outcome: Progressing Goal: Mental status will improve Outcome: Progressing Goal: Verbalization of understanding the information provided will improve Outcome: Progressing   Problem: Activity: Goal: Interest or engagement in activities will improve Outcome: Progressing Goal: Sleeping patterns will improve Outcome: Progressing   Problem: Coping: Goal: Ability to verbalize frustrations and anger appropriately will improve Outcome: Progressing Goal: Ability to demonstrate self-control will improve Outcome: Progressing   Problem: Health Behavior/Discharge Planning: Goal: Identification of resources available to assist in meeting health care needs will improve Outcome: Progressing Goal: Compliance with treatment plan for underlying cause of condition will improve Outcome: Progressing   Problem:  Physical Regulation: Goal: Ability to maintain clinical measurements within normal limits will improve Outcome: Progressing   Problem: Safety: Goal: Periods of time without injury will increase Outcome: Progressing   Problem: Activity: Goal: Will verbalize the importance of balancing activity with adequate rest periods Outcome: Progressing   Problem: Education: Goal: Will be free of psychotic symptoms Outcome: Progressing Goal: Knowledge of the prescribed therapeutic regimen will improve Outcome: Progressing   Problem: Coping: Goal: Coping ability will improve Outcome: Progressing Goal: Will verbalize feelings Outcome: Progressing   Problem: Health Behavior/Discharge Planning: Goal: Compliance with prescribed medication regimen will improve Outcome: Progressing   Problem: Nutritional: Goal: Ability to achieve adequate nutritional intake will improve Outcome: Progressing   Problem: Role Relationship: Goal: Ability to communicate needs accurately will improve Outcome: Progressing Goal: Ability to interact with others will improve Outcome: Progressing   Problem: Safety: Goal: Ability to redirect hostility and anger into socially appropriate behaviors will improve Outcome: Progressing Goal: Ability to remain free from injury will improve Outcome: Progressing   Problem: Self-Care: Goal: Ability to participate in self-care as condition permits will improve Outcome: Progressing   Problem: Self-Concept: Goal: Will verbalize positive feelings about self Outcome: Progressing

## 2019-12-26 NOTE — Progress Notes (Signed)
   12/26/19 1400  Clinical Encounter Type  Visited With Patient  Visit Type Follow-up;Spiritual support;Social support;Behavioral Health  Referral From Chaplain  Consult/Referral To Chaplain  Pt attended group today. Ch talked about anxiety and Pt was open to the conversation. Ch plans to follow-up with Pt.

## 2019-12-26 NOTE — Progress Notes (Signed)
[facility] MD Progress Note  12/26/2019 5:40 PM Shelby Jordan  MRN:  [id number] Subjective: Patient seen chart reviewed.  69 year old woman with bipolar disorder recently manic.  Still intrusive at times but less so than previously.  More organized in her speech.  Certainly not threatening or hostile.  Tolerating medicine.  No sign of current delirium or confusion Principal Problem: Bipolar I disorder, most recent episode (or current) manic ([facility]) Diagnosis: Principal Problem:   Bipolar I disorder, most recent episode (or current) manic ([place]) Active Problems:   Essential hypertension  Total Time spent with patient: 30 minutes  Past Psychiatric History: Past history of bipolar disorder  Past Medical History:  Past Medical History:  Diagnosis Date   Bipolar 1 disorder ([place])    Hepatitis C    Hypertension     Past Surgical History:  Procedure Laterality Date   APPENDECTOMY     TOE AMPUTATION     Family History: History reviewed. No pertinent family history. Family Psychiatric  History: Unknown Social History:  Social History   Substance and Sexual Activity  Alcohol Use Never     Social History   Substance and Sexual Activity  Drug Use Never    Social History   Socioeconomic History   Marital status: Legally Separated    Spouse name: Not on file   Number of children: Not on file   Years of education: Not on file   Highest education level: Not on file  Occupational History   Not on file  Tobacco Use   Smoking status: Never Smoker   Smokeless tobacco: Never Used  Substance and Sexual Activity   Alcohol use: Never   Drug use: Never   Sexual activity: Not on file  Other Topics Concern   Not on file  Social History Narrative   Not on file   Social Determinants of Health   Financial Resource Strain:    Difficulty of Paying Living Expenses:   Food Insecurity:    Worried About Charity fundraiser in the Last Year:    Arboriculturist in the Last Year:    Transportation Needs:    Film/video editor (Medical):    Lack of Transportation (Non-Medical):   Physical Activity:    Days of Exercise per Week:    Minutes of Exercise per Session:   Stress:    Feeling of Stress :   Social Connections:    Frequency of Communication with Friends and Family:    Frequency of Social Gatherings with Friends and Family:    Attends Religious Services:    Active Member of Clubs or Organizations:    Attends Archivist Meetings:    Marital Status:    Additional Social History:                         Sleep: Fair  Appetite:  Fair  Current Medications: Current Facility-Administered Medications  Medication Dose Route Frequency Provider Last Rate Last Admin   acetaminophen (TYLENOL) tablet 650 mg  650 mg Oral Q6H PRN Eulas Post, MD   650 mg at 12/26/19 1442   alum & mag hydroxide-simeth (MAALOX/MYLANTA) 200-200-20 MG/5ML suspension 30 mL  30 mL Oral Q4H PRN Eulas Post, MD       amLODipine (NORVASC) tablet 10 mg  10 mg Oral Daily Sharma Covert, MD   10 mg at 12/26/19 0809   diphenhydrAMINE (BENADRYL) capsule 25 mg  25 mg Oral QHS PRN Taye Cato,  Madie Reno, MD   25 mg at 12/25/19 2057   divalproex (DEPAKOTE) DR tablet 1,000 mg  1,000 mg Oral QHS Tinamarie Przybylski T, MD   1,000 mg at 12/25/19 2056   divalproex (DEPAKOTE) DR tablet 500 mg  500 mg Oral Daily Taite Schoeppner T, MD   500 mg at 12/26/19 0809   feeding supplement (ENSURE ENLIVE) (ENSURE ENLIVE) liquid 237 mL  237 mL Oral BID BM Clayton Bosserman T, MD   237 mL at 12/26/19 1442   guaiFENesin-dextromethorphan (ROBITUSSIN DM) 100-10 MG/5ML syrup 5 mL  5 mL Oral Q4H PRN Cailie Bosshart T, MD   5 mL at 12/26/19 0923   hydrOXYzine (ATARAX/VISTARIL) tablet 10 mg  10 mg Oral TID PRN Eulas Post, MD   10 mg at 12/22/19 0718   lisinopril (ZESTRIL) tablet 40 mg  40 mg Oral Daily Merilee Wible T, MD   40 mg at 12/26/19 0809   lithium carbonate (LITHOBID) CR  tablet 300 mg  300 mg Oral QHS Deniece Rankin T, MD   300 mg at 12/25/19 2057   magnesium hydroxide (MILK OF MAGNESIA) suspension 30 mL  30 mL Oral Daily PRN Eulas Post, MD       metoprolol succinate (TOPROL-XL) 24 hr tablet 25 mg  25 mg Oral Daily Elexia Friedt T, MD   25 mg at 12/26/19 0809   [START ON 12/27/2019] pneumococcal 23 valent vaccine (PNEUMOVAX-23) injection 0.5 mL  0.5 mL Intramuscular Tomorrow-1000 Frandy Basnett T, MD       temazepam (RESTORIL) capsule 30 mg  30 mg Oral QHS PRN Elfreida Heggs, Madie Reno, MD   30 mg at 12/22/19 2132    Lab Results: No results found for this or any previous visit (from the past 71 hour(s)).  Blood Alcohol level:  Lab Results  Component Value Date   ETH <10 30/01/6225    Metabolic Disorder Labs: Lab Results  Component Value Date   HGBA1C 6.3 (H) 12/14/2019   MPG 134.11 12/14/2019   No results found for: PROLACTIN No results found for: CHOL, TRIG, HDL, CHOLHDL, VLDL, LDLCALC  Physical Findings: AIMS:  , ,  ,  ,    CIWA:    COWS:     Musculoskeletal: Strength & Muscle Tone: within normal limits Gait & Station: normal Patient leans: N/A  Psychiatric Specialty Exam: Physical Exam  Nursing note and vitals reviewed. Constitutional: She appears well-developed and well-nourished.  HENT:  Head: Normocephalic and atraumatic.  Eyes: Pupils are equal, round, and reactive to light. Conjunctivae are normal.  Cardiovascular: Regular rhythm and normal heart sounds.  Respiratory: Effort normal.  GI: Soft.  Musculoskeletal:        General: Normal range of motion.     Cervical back: Normal range of motion.  Neurological: She is alert.  Skin: Skin is warm and dry.  Psychiatric: She has a normal mood and affect. Her speech is normal and behavior is normal. Judgment and thought content normal. Cognition and memory are normal.    Review of Systems  Constitutional: Negative.   HENT: Negative.   Eyes: Negative.   Respiratory: Negative.    Cardiovascular: Negative.   Gastrointestinal: Negative.   Musculoskeletal: Negative.   Skin: Negative.   Neurological: Negative.   Psychiatric/Behavioral: Negative.     Blood pressure 134/67, pulse 79, temperature 98.4 F (36.9 C), temperature source Oral, resp. rate 18, height 5' 4.5" (1.638 m), weight 67.1 kg, SpO2 100 %.Body mass index is 25.01 kg/m.  General Appearance: Casual  Eye Contact:  Good  Speech:  Clear and Coherent  Volume:  Normal  Mood:  Euthymic  Affect:  Congruent  Thought Process:  Goal Directed  Orientation:  Full (Time, Place, and Person)  Thought Content:  Logical  Suicidal Thoughts:  No  Homicidal Thoughts:  No  Memory:  Immediate;   Fair Recent;   Fair Remote;   Fair  Judgement:  Fair  Insight:  Fair  Psychomotor Activity:  Normal  Concentration:  Concentration: Fair  Recall:  [employer] of Knowledge:  Fair  Language:  Fair  Akathisia:  No  Handed:  Right  AIMS (if indicated):     Assets:  Desire for Improvement Housing Physical Health Resilience  ADL's:  Intact  Cognition:  WNL  Sleep:  Number of Hours: 3     Treatment Plan Summary: Daily contact with patient to assess and evaluate symptoms and progress in treatment, Medication management and Plan Still not recorded is sleeping very much.  Tolerating medicine.  She asked me to contact her granddaughter again today so I will do so this evening and see if there is any update on how the family feels like she is doing.  I am hoping we can get her discharged before long.  Alethia Berthold, MD 12/26/2019, 5:40 PM

## 2019-12-26 NOTE — B[facility] Group Notes (Signed)
LCSW Group Therapy Note  12/26/2019 2:59 PM  Type of Therapy/Topic:  Group Therapy:  Emotion Regulation  Participation Level:  Minimal   Description of Group:   The purpose of this group is to assist patients in learning to regulate negative emotions and experience positive emotions. Patients will be guided to discuss ways in which they have been vulnerable to their negative emotions. These vulnerabilities will be juxtaposed with experiences of positive emotions or situations, and patients will be challenged to use positive emotions to combat negative ones. Special emphasis will be placed on coping with negative emotions in conflict situations, and patients will process healthy conflict resolution skills.  Therapeutic Goals: 1. Patient will identify two positive emotions or experiences to reflect on in order to balance out negative emotions 2. Patient will label two or more emotions that they find the most difficult to experience 3. Patient will demonstrate positive conflict resolution skills through discussion and/or role plays  Summary of Patient Progress: Patient was present for group. Patient did engage in group.  Patient often had to be redirected due to outbursts and talking about different subject than the one at hand.    Therapeutic Modalities:   Cognitive Behavioral Therapy Feelings Identification Dialectical Behavioral Therapy  Assunta Curtis, MSW, LCSW 12/26/2019 2:59 PM

## 2019-12-26 NOTE — Plan of Care (Signed)
Patient presents with pleasant affect and is redirectable   Problem: Education: Goal: Emotional status will improve Outcome: Not Progressing Goal: Mental status will improve Outcome: Not Progressing

## 2019-12-27 DIAGNOSIS — F3110 Bipolar disorder, current episode manic without psychotic features, unspecified: Secondary | ICD-10-CM | POA: Diagnosis not present

## 2019-12-27 MED ORDER — DIPHENHYDRAMINE HCL 25 MG PO CAPS: 25.0000 mg | ORAL_CAPSULE | Freq: Every evening | ORAL | 1 refills | Status: DC | PRN

## 2019-12-27 MED ORDER — LISINOPRIL 40 MG PO TABS: 40.0000 mg | ORAL_TABLET | Freq: Every day | ORAL | 1 refills | Status: DC

## 2019-12-27 MED ORDER — LITHIUM CARBONATE ER 300 MG PO TBCR: 300.0000 mg | EXTENDED_RELEASE_TABLET | Freq: Every day | ORAL | 1 refills | Status: DC

## 2019-12-27 MED ORDER — METOPROLOL SUCCINATE ER 25 MG PO TB24: 25.0000 mg | ORAL_TABLET | Freq: Every day | ORAL | 1 refills | Status: DC

## 2019-12-27 MED ORDER — TEMAZEPAM 30 MG PO CAPS: 30.0000 mg | ORAL_CAPSULE | Freq: Every evening | ORAL | 1 refills | Status: DC | PRN

## 2019-12-27 MED ORDER — DIVALPROEX SODIUM 500 MG PO DR TAB: 500.0000 mg | DELAYED_RELEASE_TABLET | Freq: Three times a day (TID) | ORAL | 1 refills | Status: DC

## 2019-12-27 MED ORDER — AMLODIPINE BESYLATE 10 MG PO TABS: 10.0000 mg | ORAL_TABLET | Freq: Every day | ORAL | 1 refills | Status: DC

## 2019-12-27 NOTE — BHH Counselor (Signed)
CSW was contacted by patient's granddaughter Sandie Ano, [phone].  CSW notes that Sandie Ano is 16.  CSW has attempted several times to call pt's adult granddaughter [place] [phone], however, no call answer or call back has been received.   Sandie Ano reports that she does not have the patient's son (Jayla's father) number when asked by CSW.  Sandie Ano confirmed the address in the patients char and provided by pt.  CSW explained the issue with the cab GPS not reading the address.  CSW asked for the patient's daughter Sherlynn Carbon number, and Sandie Ano provided the following: [phone].  CSW called pt's daughter who reports that the address should read. When CSW reiterated that the address is not she provided the following address stating it was "around the corner" [address], [address], [place].  She reports that she will watch for the patients arrival.  CSW discussed this plan with both Unit Director and CSW supervisor who agreed that this plan was acceptable.  CSW reviewd with the pt's nurse Skylee that the [facility] would pick pt up in the physician's lot, drive pt to address listed above and pt's daughter will look for the patients arrival.  Nurse was asked to call the daughter when pt left so she can be on the lookout.  Assunta Curtis, MSW, LCSW 12/27/2019 4:33 PM

## 2019-12-27 NOTE — Progress Notes (Signed)
Recreation Therapy Notes  INPATIENT RECREATION TR PLAN  Patient Details Name: Shelby Jordan MRN: [id number] DOB: 01-21-1951 Today's Date: 12/27/2019  Rec Therapy Plan Is patient appropriate for Therapeutic Recreation?: Yes Treatment times per week: at least 3 Estimated Length of Stay: 5-7 days TR Treatment/Interventions: Group participation (Comment)  Discharge Criteria Pt will be discharged from therapy if:: Discharged Treatment plan/goals/alternatives discussed and agreed upon by:: Patient/family  Discharge Summary Short term goals set: Patient will focus on task/topic with 2 prompts from staff within 5 recreation therapy group sessions Short term goals met: Complete Progress toward goals comments: Groups attended Which groups?: Goal setting, AAA/T, Communication, Other (Comment)(Problem Solving, Self-care, Happiness) Reason goals not met: N/A Therapeutic equipment acquired: N/A Reason patient discharged from therapy: Discharge from hospital Pt/family agrees with progress & goals achieved: Yes Date patient discharged from therapy: 12/27/19   Rashawn Rayman 12/27/2019, 12:33 PM

## 2019-12-27 NOTE — Progress Notes (Addendum)
D: Pt alert and oriented x 4. Pt denies experiencing any pain, SI/HI, or AVH at this time. Pt reports she will be able to keep herself safe when she returns home.   A: Pt received discharge and medication education/information. Pt belongings were returned upon discharge, pt confirms belongs.   R: Pt  verbalized understanding of discharge and medication education/information.  Pt  escorted to front lobby where pt was picked up by safe transport at 1640.   Pt's family member Sherlynn Carbon was called twice with no contact made, went straight to voicemail, message was left.

## 2019-12-27 NOTE — BHH Counselor (Signed)
CSw called to get Safe Transport for patient. CSW was told by [employer] that the address provided by patient and in patient's chart is incorrect.    CSW called granddaughter to verify, CSW left HIPAA compliant voicemail.  CSW called unit to speak with pt's nurse Arlyss Repress, who reports that she would rather wait for pt's granddaughter to return this CSW's call before getting pt to provide the address.  Nurse cites concerns that patient may still provide the incorrect address and be dropped off at the wrong home.   CSW understands.    CSW asked that unit be aware if family calls so that correct address can be received.  Assunta Curtis, MSW, LCSW 12/27/2019 11:34 AM

## 2019-12-27 NOTE — Progress Notes (Signed)
Recreation Therapy Notes  Date: 12/27/2019  Time: 9:30 am  Location: Craft room  Behavioral response: Appropriate,Redirection needed   Intervention Topic: Problem solving   Discussion/Intervention:  Group content on today was focused on problem solving. The group described what problem solving is. Patients expressed how problems affect them and how they deal with problems. Individuals identified healthy ways to deal with problems. Patients explained what normally happens to them when they do not deal with problems. The group expressed reoccurring problems for them. The group participated in the intervention "Put the story together" with their peers and worked together to put a story that was broken up in the correct order. Clinical Observations/Feedback:  Patient came to group late and was focused on what peers and staff had to say about problem solving.Participant defined problem solving as a way to solve a difficult situation. She and a peer went back and forth into a disagreement and was redirected to focus on th task at hand. Individual was social with peers and staff while participating on the intervention.  Oona Trammel LRT/CTRS          Rose-Marie Hickling 12/27/2019 11:57 AM

## 2019-12-27 NOTE — Progress Notes (Signed)
Patient alert and oriented x 4, affect is blunted her thoughts are organized and coherent, non tangential or pressured speech , she was calm,  receptive to staff and interacting appropriately with peers staff, 15 minutes safety checks maintained will continue to monitor.

## 2019-12-27 NOTE — Discharge Summary (Signed)
Physician Discharge Summary Note  Patient:  Shelby Jordan is an 69 y.o., female MRN:  [id number] DOB:  02-17-1951 Patient phone:  [phone] (home)  Patient address:   [address] [place] [address],  Total Time spent with patient: 30 minutes  Date of Admission:  12/14/2019 Date of Discharge: 12/27/2019  Reason for Admission: Patient was admitted because of hyperactivity and disorganized behavior consistent with mania  Principal Problem: Bipolar I disorder, most recent episode (or current) manic ([place]) Discharge Diagnoses: Principal Problem:   Bipolar I disorder, most recent episode (or current) manic ([place]) Active Problems:   Essential hypertension   Past Psychiatric History: History of bipolar disorder  Past Medical History:  Past Medical History:  Diagnosis Date  . Bipolar 1 disorder ([place])   . Hepatitis C   . Hypertension     Past Surgical History:  Procedure Laterality Date  . APPENDECTOMY    . TOE AMPUTATION     Family History: History reviewed. No pertinent family history. Family Psychiatric  History: None reported Social History:  Social History   Substance and Sexual Activity  Alcohol Use Never     Social History   Substance and Sexual Activity  Drug Use Never    Social History   Socioeconomic History  . Marital status: Legally Separated    Spouse name: Not on file  . Number of children: Not on file  . Years of education: Not on file  . Highest education level: Not on file  Occupational History  . Not on file  Tobacco Use  . Smoking status: Never Smoker  . Smokeless tobacco: Never Used  Substance and Sexual Activity  . Alcohol use: Never  . Drug use: Never  . Sexual activity: Not on file  Other Topics Concern  . Not on file  Social History Narrative  . Not on file   Social Determinants of Health   Financial Resource Strain:   . Difficulty of Paying Living Expenses:   Food Insecurity:   . Worried About Charity fundraiser in the Last  Year:   . Arboriculturist in the Last Year:   Transportation Needs:   . Film/video editor (Medical):   Marland Kitchen Lack of Transportation (Non-Medical):   Physical Activity:   . Days of Exercise per Week:   . Minutes of Exercise per Session:   Stress:   . Feeling of Stress :   Social Connections:   . Frequency of Communication with Friends and Family:   . Frequency of Social Gatherings with Friends and Family:   . Attends Religious Services:   . Active Member of Clubs or Organizations:   . Attends Archivist Meetings:   Marland Kitchen Marital Status:     Hospital Course: Patient admitted to psychiatric unit and continued on 15-minute checks.  Patient was cooperative with medication.  Was treated initially with Depakote only as this was what she had reported to be most helpful in the past.  Initially resistant to any other medicine.  Appeared to be relatively stable and was considered for discharge but family insisted that the patient was still unwell and too disorganized to be discharged.  She was kept longer and Depakote was adjusted.  Higher dosages up to 2000 mg a day produced unacceptably high Depakote levels near 200.  We have currently settled on a total of 1500 mg a day which appears to be tolerable.  300 mg of lithium was added in the evening and the patient  is tolerating this without any side effects as well.  She has not displayed any dangerous or aggressive behavior on the unit.  She continues to be talkative but not in an obviously pathological way.  No sign of current psychosis.  Patient expresses an understanding that she needs to behave safely outside the hospital.  Currently no longer meets commitment criteria nor requires inpatient treatment she is being discharged with local referral to [facility].  Physical Findings: AIMS:  , ,  ,  ,    CIWA:    COWS:     Musculoskeletal: Strength & Muscle Tone: within normal limits Gait & Station: normal Patient leans: N/A  Psychiatric Specialty  Exam: Physical Exam  Nursing note and vitals reviewed. Constitutional: She appears well-developed and well-nourished.  HENT:  Head: Normocephalic and atraumatic.  Eyes: Pupils are equal, round, and reactive to light. Conjunctivae are normal.  Cardiovascular: Regular rhythm and normal heart sounds.  Respiratory: Effort normal. No respiratory distress.  GI: Soft.  Musculoskeletal:        General: Normal range of motion.     Cervical back: Normal range of motion.  Neurological: She is alert.  Skin: Skin is warm and dry.  Psychiatric: She has a normal mood and affect. Her behavior is normal. Judgment and thought content normal.    Review of Systems  Constitutional: Negative.   HENT: Negative.   Eyes: Negative.   Respiratory: Negative.   Cardiovascular: Negative.   Gastrointestinal: Negative.   Musculoskeletal: Negative.   Skin: Negative.   Neurological: Negative.   Psychiatric/Behavioral: Negative.     Blood pressure 138/72, pulse 72, temperature 98.1 F (36.7 C), temperature source Oral, resp. rate 18, height 5' 4.5" (1.638 m), weight 67.1 kg, SpO2 100 %.Body mass index is 25.01 kg/m.  General Appearance: Casual  Eye Contact:  Fair  Speech:  Clear and Coherent  Volume:  Normal  Mood:  Euthymic  Affect:  Congruent  Thought Process:  Goal Directed  Orientation:  Full (Time, Place, and Person)  Thought Content:  Logical  Suicidal Thoughts:  No  Homicidal Thoughts:  No  Memory:  Immediate;   Fair Recent;   Fair Remote;   Fair  Judgement:  Fair  Insight:  Fair  Psychomotor Activity:  Normal  Concentration:  Concentration: Fair  Recall:  [employer] of Knowledge:  Fair  Language:  Fair  Akathisia:  No  Handed:  Right  AIMS (if indicated):     Assets:  Desire for Improvement  ADL's:  Intact  Cognition:  WNL  Sleep:  Number of Hours: 3        Has this patient used any form of tobacco in the last 30 days? (Cigarettes, Smokeless Tobacco, Cigars, and/or Pipes) Yes,  No  Blood Alcohol level:  Lab Results  Component Value Date   ETH <10 86/76/1950    Metabolic Disorder Labs:  Lab Results  Component Value Date   HGBA1C 6.3 (H) 12/14/2019   MPG 134.11 12/14/2019   No results found for: PROLACTIN No results found for: CHOL, TRIG, HDL, CHOLHDL, VLDL, LDLCALC  See Psychiatric Specialty Exam and Suicide Risk Assessment completed by Attending Physician prior to discharge.  Discharge destination:  Home  Is patient on multiple antipsychotic therapies at discharge:  No   Has Patient had three or more failed trials of antipsychotic monotherapy by history:  No  Recommended Plan for Multiple Antipsychotic Therapies: NA  Discharge Instructions    Diet - low sodium heart healthy  Complete by: As directed    Increase activity slowly   Complete by: As directed    Increase activity slowly   Complete by: As directed      Allergies as of 12/27/2019   No Known Allergies     Medication List    TAKE these medications     Indication  amLODipine 10 MG tablet Commonly known as: NORVASC Take 1 tablet (10 mg total) by mouth daily.  Indication: High Blood Pressure Disorder   diphenhydrAMINE 25 mg capsule Commonly known as: BENADRYL Take 1 capsule (25 mg total) by mouth at bedtime as needed for sleep.  Indication: Trouble Sleeping   divalproex 500 MG DR tablet Commonly known as: DEPAKOTE Take 1 tablet (500 mg total) by mouth 3 (three) times daily. Take 1 in the morning, and 2 at night  Indication: Manic Phase of Manic-Depression   lisinopril 40 MG tablet Commonly known as: ZESTRIL Take 1 tablet (40 mg total) by mouth daily.  Indication: High Blood Pressure Disorder   lithium carbonate 300 MG CR tablet Commonly known as: LITHOBID Take 1 tablet (300 mg total) by mouth at bedtime.  Indication: Manic-Depression   metoprolol succinate 25 MG 24 hr tablet Commonly known as: TOPROL-XL Take 1 tablet (25 mg total) by mouth daily. Start taking on:  December 28, 2019  Indication: High Blood Pressure Disorder   temazepam 30 MG capsule Commonly known as: RESTORIL Take 1 capsule (30 mg total) by mouth at bedtime as needed for sleep.  Indication: [facility] Follow up.   Why: Appointment is scheduled for 01/01/2020 at 9:30AM.  Future appointments will have a 20% copay.  Thanks! Contact information: [place] [address] [phone]           Follow-up recommendations:  Activity:  Activity as tolerated Diet:  Heart healthy or regular diet Other:  Follow-up with [facility]  Comments: Prescriptions provided at discharge  Signed: Alethia Berthold, MD 12/27/2019, 5:20 PM

## 2019-12-27 NOTE — BHH Suicide Risk Assessment (Signed)
[facility] Discharge Suicide Risk Assessment   Principal Problem: Bipolar I disorder, most recent episode (or current) manic ([place]) Discharge Diagnoses: Principal Problem:   Bipolar I disorder, most recent episode (or current) manic ([place]) Active Problems:   Essential hypertension   Total Time spent with patient: 30 minutes  Musculoskeletal: Strength & Muscle Tone: within normal limits Gait & Station: normal Patient leans: N/A  Psychiatric Specialty Exam: Review of Systems  Constitutional: Negative.   HENT: Negative.   Eyes: Negative.   Respiratory: Negative.   Cardiovascular: Negative.   Gastrointestinal: Negative.   Musculoskeletal: Negative.   Skin: Negative.   Neurological: Negative.   Psychiatric/Behavioral: Negative.     Blood pressure 138/72, pulse 72, temperature 98.1 F (36.7 C), temperature source Oral, resp. rate 18, height 5' 4.5" (1.638 m), weight 67.1 kg, SpO2 100 %.Body mass index is 25.01 kg/m.  General Appearance: Casual  Eye Contact::  Good  Speech:  Clear and [id number]  Volume:  Normal  Mood:  Euthymic  Affect:  Congruent  Thought Process:  Goal Directed  Orientation:  Full (Time, Place, and Person)  Thought Content:  Logical  Suicidal Thoughts:  No  Homicidal Thoughts:  No  Memory:  Immediate;   Fair Recent;   Fair Remote;   Fair  Judgement:  Fair  Insight:  Fair  Psychomotor Activity:  Normal  Concentration:  Fair  Recall:  [employer] of Knowledge:Fair  Language: Fair  Akathisia:  No  Handed:  Right  AIMS (if indicated):     Assets:  Financial Resources/Insurance Housing Resilience Social Support  Sleep:  Number of Hours: 3  Cognition: WNL  ADL's:  Intact   Mental Status Per Nursing Assessment::   On Admission:  NA  Demographic Factors:  Age 30 or older and Divorced or widowed  Loss Factors: NA  Historical Factors: Impulsivity  Risk Reduction Factors:   Responsible for children under 36 years of age, Sense of responsibility  to family, Living with another person, especially a relative, Positive social support, Positive therapeutic relationship and Positive coping skills or problem solving skills  Continued Clinical Symptoms:  Bipolar Disorder:   Mixed State  Cognitive Features That Contribute To Risk:  None    Suicide Risk:  Minimal: No identifiable suicidal ideation.  Patients presenting with no risk factors but with morbid ruminations; may be classified as minimal risk based on the severity of the depressive symptoms  Follow-up Information    [facility] Follow up.   Why: Appointment is scheduled for 01/01/2020 at 9:30AM.  Future appointments will have a 20% copay.  Thanks! Contact information: [place] [address] [phone]           Plan Of Care/Follow-up recommendations:  Activity:  Activity as tolerated Diet:  Regular diet Other:  Follow-up with outpatient mental health through [facility], MD 12/27/2019, 9:19 AM

## 2019-12-27 NOTE — Plan of Care (Signed)
D: Pt alert and oriented x 4. Pt rates depression 2/10, and anxiety 5/10.Pt goal: "getting home with my daughter." Pt reports energy leave as normal and concentration as being good. Pt reports sleep last night as being fair. Pt did receive medications for sleep and did find them helpful. Pt reports experiencing toe pain that radiates up the leg to the lower back. Pt denies experiencing any SI/HI, or AVH at this time.   A: Scheduled medications administered to pt, per MD orders. Support and encouragement provided. Frequent verbal contact made. Routine safety checks conducted q15 minutes.   R: No adverse drug reactions noted. Pt verbally contracts for safety at this time. Pt complaint with medications and treatment plan. Pt interacts well with others on the unit. Pt remains safe at this time. Will continue to monitor.   Problem: Activity: Goal: Interest or engagement in activities will improve Outcome: Progressing   Problem: Coping: Goal: Ability to verbalize frustrations and anger appropriately will improve Outcome: Progressing   Problem: Health Behavior/Discharge Planning: Goal: Compliance with treatment plan for underlying cause of condition will improve Outcome: Progressing   Problem: Coping: Goal: Will verbalize feelings Outcome: Progressing   Problem: Health Behavior/Discharge Planning: Goal: Compliance with prescribed medication regimen will improve Outcome: Progressing   Problem: Nutritional: Goal: Ability to achieve adequate nutritional intake will improve Outcome: Progressing   Problem: Role Relationship: Goal: Ability to interact with others will improve Outcome: Progressing

## 2019-12-27 NOTE — Plan of Care (Signed)
  Problem: Group Participation Goal: STG - Patient will focus on task/topic with 2 prompts from staff within 5 recreation therapy group sessions Description: STG - Patient will focus on task/topic with 2 prompts from staff within 5 recreation therapy group sessions Outcome: Completed/Met

## 2019-12-27 NOTE — Progress Notes (Signed)
  [facility] Adult Case Management Discharge Plan :  Will you be returning to the same living situation after discharge:  Yes,  pt reports she is returning to the home with her granddaughter.  At discharge, do you have transportation home?: Yes,  CSW will assist with transportation needs.  Do you have the ability to pay for your medications: Yes,  [employer]  Release of information consent forms completed and in the chart;  Patient's signature needed at discharge.  Patient to Follow up at: Follow-up Information    [facility] Follow up.   Why: Appointment is scheduled for 01/01/2020 at 9:30AM.  Future appointments will have a 20% copay.  Thanks! Contact information: [place] [address] [phone]           Next level of care provider has access to [facility] and Suicide Prevention discussed: Yes,  SPE completed with the patient's granddaughter.     Has patient been referred to the Quitline?: N/A patient is not a smoker  Patient has been referred for addiction treatment: Pt. refused referral  Rozann Lesches, LCSW 12/27/2019, 11:13 AM

## 2019-12-28 NOTE — BHH Counselor (Signed)
CSW received call from patient's daughter Sherlynn Carbon, [phone].  Daughter expressed concern that her mother was missing a necklace.  CSW informed that she will ask nursing staff and security to check lockers if available.   Daughter also expressed concerns that patient was not ready for discharge.  She reports that the patient "has already went to someone elese's home thinking it was hers, she's dangerous. I am not going to live where I think that someone is going to kill me.  I have been up all night working and wondering."  CSW asked for clarification on the comment of "kill" and daughter just repeated that pt is dangerous due to wandering behaviors.  Daughter also expressed concerns that hte psychiatrist made agreement with family to discuss discharge prior to and this has not occurred.  CSW informed that she would ask psychiatrist to follow up with the family.  CSW informed charge nurse of necklace concerns.  CSW messaged the psychiatrist to make aware of the family's concerns.  Assunta Curtis, MSW, LCSW 12/28/2019 11:54 AM

## 2020-01-01 ENCOUNTER — Encounter (HOSPITAL_COMMUNITY): Payer: Self-pay | Admitting: Emergency Medicine

## 2020-01-01 ENCOUNTER — Other Ambulatory Visit: Payer: Self-pay

## 2020-01-01 ENCOUNTER — Emergency Department (HOSPITAL_COMMUNITY)
Admission: EM | Admit: 2020-01-01 | Discharge: 2020-01-02 | Disposition: A | Discharge status: Home / Self Care | Payer: Medicare (Managed Care) | Attending: Emergency Medicine | Admitting: Emergency Medicine

## 2020-01-01 DIAGNOSIS — I10 Essential (primary) hypertension: Secondary | ICD-10-CM | POA: Insufficient documentation

## 2020-01-01 DIAGNOSIS — Z79899 Other long term (current) drug therapy: Secondary | ICD-10-CM | POA: Diagnosis not present

## 2020-01-01 DIAGNOSIS — F319 Bipolar disorder, unspecified: Secondary | ICD-10-CM | POA: Diagnosis not present

## 2020-01-01 DIAGNOSIS — Z20822 Contact with and (suspected) exposure to covid-19: Secondary | ICD-10-CM | POA: Diagnosis not present

## 2020-01-01 DIAGNOSIS — F3110 Bipolar disorder, current episode manic without psychotic features, unspecified: Secondary | ICD-10-CM

## 2020-01-01 LAB — COMPREHENSIVE METABOLIC PANEL
ALT: 21 U/L (ref 0–44)
AST: 20 U/L (ref 15–41)
Albumin: 4.4 g/dL (ref 3.5–5.0)
Alkaline Phosphatase: 40 U/L (ref 38–126)
Anion gap: 12 (ref 5–15)
BUN: 26 mg/dL — ABNORMAL HIGH (ref 8–23)
CO2: 25 mmol/L (ref 22–32)
Calcium: 9.3 mg/dL (ref 8.9–10.3)
Chloride: 106 mmol/L (ref 98–111)
Creatinine, Ser: 1.54 mg/dL — ABNORMAL HIGH (ref 0.44–1.00)
GFR calc Af Amer: 40 mL/min — ABNORMAL LOW (ref >60)
GFR calc non Af Amer: 34 mL/min — ABNORMAL LOW (ref >60)
Glucose, Bld: 112 mg/dL — ABNORMAL HIGH (ref 70–99)
Potassium: 4.5 mmol/L (ref 3.5–5.1)
Sodium: 143 mmol/L (ref 135–145)
Total Bilirubin: 0.3 mg/dL (ref 0.3–1.2)
Total Protein: 7.6 g/dL (ref 6.5–8.1)

## 2020-01-01 LAB — CBC WITH DIFFERENTIAL/PLATELET
Abs Immature Granulocytes: 0.04 10*3/uL (ref 0.00–0.07)
Basophils Absolute: 0 10*3/uL (ref 0.0–0.1)
Basophils Relative: 0 %
Eosinophils Absolute: 0.2 10*3/uL (ref 0.0–0.5)
Eosinophils Relative: 2 %
HCT: 38.2 % (ref 36.0–46.0)
Hemoglobin: 12.4 g/dL (ref 12.0–15.0)
Immature Granulocytes: 1 %
Lymphocytes Relative: 17 %
Lymphs Abs: 1.4 10*3/uL (ref 0.7–4.0)
MCH: 30.8 pg (ref 26.0–34.0)
MCHC: 32.5 g/dL (ref 30.0–36.0)
MCV: 94.8 fL (ref 80.0–100.0)
Monocytes Absolute: 0.8 10*3/uL (ref 0.1–1.0)
Monocytes Relative: 9 %
Neutro Abs: 6.1 10*3/uL (ref 1.7–7.7)
Neutrophils Relative %: 71 %
Platelets: 305 10*3/uL (ref 150–400)
RBC: 4.03 MIL/uL (ref 3.87–5.11)
RDW: 15 % (ref 11.5–15.5)
WBC: 8.6 10*3/uL (ref 4.0–10.5)
nRBC: 0 % (ref 0.0–0.2)

## 2020-01-01 LAB — URINALYSIS, ROUTINE W REFLEX MICROSCOPIC
Bilirubin Urine: NEGATIVE
Glucose, UA: NEGATIVE mg/dL
Hgb urine dipstick: NEGATIVE
Ketones, ur: NEGATIVE mg/dL
Leukocytes,Ua: NEGATIVE
Nitrite: NEGATIVE
Protein, ur: NEGATIVE mg/dL
Specific Gravity, Urine: 1.013 (ref 1.005–1.030)
pH: 6 (ref 5.0–8.0)

## 2020-01-01 LAB — RAPID URINE DRUG SCREEN, HOSP PERFORMED
Amphetamines: NOT DETECTED
Barbiturates: NOT DETECTED
Benzodiazepines: POSITIVE — AB
Cocaine: NOT DETECTED
Opiates: NOT DETECTED
Tetrahydrocannabinol: NOT DETECTED

## 2020-01-01 LAB — VALPROIC ACID LEVEL: Valproic Acid Lvl: 85 ug/mL (ref 50.0–100.0)

## 2020-01-01 LAB — SARS CORONAVIRUS 2 BY RT PCR (HOSPITAL ORDER, PERFORMED IN ~~LOC~~ HOSPITAL LAB): SARS Coronavirus 2: NEGATIVE

## 2020-01-01 LAB — ETHANOL: Alcohol, Ethyl (B): <10 mg/dL (ref <10)

## 2020-01-01 MED ORDER — LITHIUM CARBONATE ER 300 MG PO TBCR
300.0000 mg | EXTENDED_RELEASE_TABLET | Freq: Every day | ORAL | Status: DC
  Administered 2020-01-01: 300 mg via ORAL
  Filled 2020-01-01: qty 1

## 2020-01-01 MED ORDER — TEMAZEPAM 15 MG PO CAPS
30.0000 mg | ORAL_CAPSULE | Freq: Every evening | ORAL | Status: DC | PRN
  Administered 2020-01-01: 30 mg via ORAL
  Filled 2020-01-01: qty 2

## 2020-01-01 MED ORDER — DIVALPROEX SODIUM 500 MG PO DR TAB
500.0000 mg | DELAYED_RELEASE_TABLET | Freq: Three times a day (TID) | ORAL | Status: DC
  Administered 2020-01-01 – 2020-01-02 (×2): 500 mg via ORAL
  Filled 2020-01-01 (×3): qty 1

## 2020-01-01 MED ORDER — LISINOPRIL 20 MG PO TABS
40.0000 mg | ORAL_TABLET | Freq: Every day | ORAL | Status: DC
  Administered 2020-01-02: 40 mg via ORAL
  Filled 2020-01-01 (×2): qty 2

## 2020-01-01 MED ORDER — AMLODIPINE BESYLATE 5 MG PO TABS
10.0000 mg | ORAL_TABLET | Freq: Every day | ORAL | Status: DC
  Administered 2020-01-02: 10 mg via ORAL
  Filled 2020-01-01 (×2): qty 2

## 2020-01-01 MED ORDER — GUAIFENESIN 100 MG/5ML PO SOLN
5.0000 mL | ORAL | Status: DC | PRN
  Administered 2020-01-01: 100 mg via ORAL
  Filled 2020-01-01: qty 10

## 2020-01-01 MED ORDER — METOPROLOL SUCCINATE ER 50 MG PO TB24
25.0000 mg | ORAL_TABLET | Freq: Every day | ORAL | Status: DC
  Administered 2020-01-02: 25 mg via ORAL
  Filled 2020-01-01 (×2): qty 1

## 2020-01-01 NOTE — ED Notes (Signed)
Pt moved to Acute Unit. 15-minute checks initiated and camera on.

## 2020-01-01 NOTE — ED Provider Notes (Signed)
Patient care assumed at 1630.  Pt here under IVC by family for odd behavior, hyperactivity.  Labs pending.    Labs demonstrate mild elevation and creatinine when compared to priors. Patient is drinking fluids without difficulty in the emergency department, appears euvolemic. Do not feel she needs IV fluids at this time. She will need her renal function rechecked in about a week, sooner if she has new symptoms. She has been medically cleared for psychiatric treatment. Psychiatry consulted regarding mania.   Quintella Reichert, MD 01/02/20 [phone]

## 2020-01-01 NOTE — ED Notes (Signed)
Pt belongings placed in locker 47- one plastic bag of belongings, 1 knapsack, 1 biohazard bag with cash, cell phone, and credit card

## 2020-01-01 NOTE — ED Notes (Signed)
Pt provided w/labeled specimen cup for urine collection. [employer]

## 2020-01-01 NOTE — ED Notes (Signed)
Patient attempting to obtain urine sample at this time. RN explained to patient that her Kidney function is slightly elevated and she needs to drink plenty of water. Patient reports understanding and reports she will drink water.

## 2020-01-01 NOTE — ED Provider Notes (Signed)
[facility] DEPT Provider Note   CSN: [id number] Arrival date & time: 01/01/20  1451     History Medical clearance  Shelby Jordan is a 69 y.o. female.  HPI   Patient has history of bipolar disorder.  She was recently seen at [facility].  Patient states she was supposed to follow-up with a behavioral health appointment today.  Patient states none of her family members will take her to the appointment.  Patient states her daughter became upset and IVC at her.  Patient was brought in by police.  According to the IVC paperwork patient has not been sleeping or attending to her personal hygiene.  Family members indicated the patient has been hallucinating and talking to herself.  According to family member she was standing outside on the porch from 2 AM to 6 AM talking to herself.  She became angry and aggressive with the family member who then placed on IVC.  Patient denies any SI or HI.  Past Medical History:  Diagnosis Date  . Bipolar 1 disorder ([place])   . Hepatitis C   . Hypertension     Patient Active Problem List   Diagnosis Date Noted  . Bipolar disorder (manic depression) ([place]) 12/15/2019  . Bipolar I disorder, most recent episode (or current) manic ([place]) 12/15/2019  . Essential hypertension 12/15/2019    Past Surgical History:  Procedure Laterality Date  . APPENDECTOMY    . TOE AMPUTATION       OB History   No obstetric history on file.     History reviewed. No pertinent family history.  Social History   Tobacco Use  . Smoking status: Never Smoker  . Smokeless tobacco: Never Used  Substance Use Topics  . Alcohol use: Never  . Drug use: Never    Home Medications Prior to Admission medications   Medication Sig Start Date End Date Taking? Authorizing Provider  amLODipine (NORVASC) 10 MG tablet Take 1 tablet (10 mg total) by mouth daily. 12/27/19   Clapacs, Madie Reno, MD  diphenhydrAMINE (BENADRYL) 25 mg capsule Take 1 capsule  (25 mg total) by mouth at bedtime as needed for sleep. 12/27/19   Clapacs, Madie Reno, MD  divalproex (DEPAKOTE) 500 MG DR tablet Take 1 tablet (500 mg total) by mouth 3 (three) times daily. Take 1 in the morning, and 2 at night 12/27/19   Clapacs, Madie Reno, MD  lisinopril (ZESTRIL) 40 MG tablet Take 1 tablet (40 mg total) by mouth daily. 12/27/19   Clapacs, Madie Reno, MD  lithium carbonate (LITHOBID) 300 MG CR tablet Take 1 tablet (300 mg total) by mouth at bedtime. 12/27/19   Clapacs, Madie Reno, MD  metoprolol succinate (TOPROL-XL) 25 MG 24 hr tablet Take 1 tablet (25 mg total) by mouth daily. 12/28/19   Clapacs, Madie Reno, MD  temazepam (RESTORIL) 30 MG capsule Take 1 capsule (30 mg total) by mouth at bedtime as needed for sleep. 12/27/19   Clapacs, Madie Reno, MD    Allergies    Patient has no known allergies.  Review of Systems   Review of Systems  All other systems reviewed and are negative.   Physical Exam Updated Vital Signs BP (!) 140/58 (BP Location: Left Arm)   Pulse 96   Temp 99 F (37.2 C) (Oral)   Resp 18   SpO2 93%   Physical Exam Vitals and nursing note reviewed.  Constitutional:      General: She is not in acute distress.  Appearance: She is well-developed.  HENT:     Head: Normocephalic and atraumatic.     Right Ear: External ear normal.     Left Ear: External ear normal.  Eyes:     General: No scleral icterus.       Right eye: No discharge.        Left eye: No discharge.     Conjunctiva/sclera: Conjunctivae normal.  Neck:     Trachea: No tracheal deviation.  Cardiovascular:     Rate and Rhythm: Normal rate and regular rhythm.  Pulmonary:     Effort: Pulmonary effort is normal. No respiratory distress.     Breath sounds: Normal breath sounds. No stridor. No wheezing or rales.  Abdominal:     General: Bowel sounds are normal. There is no distension.     Palpations: Abdomen is soft.     Tenderness: There is no abdominal tenderness. There is no guarding or rebound.    Musculoskeletal:        General: No tenderness.     Cervical back: Neck supple.  Skin:    General: Skin is warm and dry.     Findings: No rash.  Neurological:     Mental Status: She is alert.     Cranial Nerves: No cranial nerve deficit (no facial droop, extraocular movements intact, no slurred speech).     Sensory: No sensory deficit.     Motor: No abnormal muscle tone or seizure activity.     Coordination: Coordination normal.  Psychiatric:        Mood and Affect: Mood is not depressed. Affect is not angry.        Speech: Speech is not slurred.        Behavior: Behavior is hyperactive.        Thought Content: Thought content does not include homicidal or suicidal ideation.     ED Results / Procedures / Treatments   Labs (all labs ordered are listed, but only abnormal results are displayed) Labs Reviewed  SARS CORONAVIRUS 2 BY RT PCR (HOSPITAL ORDER, [facility])  COMPREHENSIVE METABOLIC PANEL  ETHANOL  RAPID URINE DRUG SCREEN, HOSP PERFORMED  CBC WITH DIFFERENTIAL/PLATELET    EKG None  Radiology No results found.  Procedures Procedures (including critical care time)  Medications Ordered in ED Medications  amLODipine (NORVASC) tablet 10 mg (10 mg Oral Not Given 01/01/20 1600)  divalproex (DEPAKOTE) DR tablet 500 mg (500 mg Oral Not Given 01/01/20 1600)  lisinopril (ZESTRIL) tablet 40 mg (40 mg Oral Not Given 01/01/20 1600)  lithium carbonate (LITHOBID) CR tablet 300 mg (has no administration in time range)  metoprolol succinate (TOPROL-XL) 24 hr tablet 25 mg (has no administration in time range)  temazepam (RESTORIL) capsule 30 mg (has no administration in time range)  guaiFENesin (ROBITUSSIN) 100 MG/5ML solution 100 mg (100 mg Oral Given 01/01/20 1621)    ED Course  I have reviewed the triage vital signs and the nursing notes.  Pertinent labs & imaging results that were available during my care of the patient were reviewed by me and  considered in my medical decision making (see chart for details).    MDM Rules/Calculators/A&P                         The patient has been placed in psychiatric observation due to the need to provide a safe environment for the patient while obtaining psychiatric consultation and evaluation, as  well as ongoing medical and medication management to treat the patient's condition.  The patient has been placed under full IVC at this time.  Patient presented to ED for psychiatric evaluation.  Family indicates worsening manic type behavior.  Patient currently denies SI or HI.  She was placed on IVC.  Laboratory tests are pending but she appears medically stable.  We will follow up on labs and consult psychiatry Final Clinical Impression(s) / ED Diagnoses Final diagnoses:  Bipolar I disorder with mania [facility])    Rx / DC Orders ED Discharge Orders    None       Dorie Rank, MD 01/01/20 [phone]

## 2020-01-01 NOTE — ED Triage Notes (Addendum)
IVC'd by daughter. Patient states daughter says she is "up all night long".  She reports having a stressed family dynamic.   According to IVC paper work, not only is the patient not sleep, she no longer tends to her personal hygiene, has auditory hallucinations, and is becoming increasingly more aggressive and agitated.

## 2020-01-01 NOTE — ED Notes (Signed)
Patient has drank 40oz of water during this RN's shift

## 2020-01-02 ENCOUNTER — Encounter (HOSPITAL_COMMUNITY): Payer: Self-pay | Admitting: Registered Nurse

## 2020-01-02 DIAGNOSIS — F319 Bipolar disorder, unspecified: Secondary | ICD-10-CM | POA: Diagnosis not present

## 2020-01-02 LAB — LITHIUM LEVEL: Lithium Lvl: 0.46 mmol/L — ABNORMAL LOW (ref 0.60–1.20)

## 2020-01-02 NOTE — BH Assessment (Signed)
Tele Assessment Note   Patient Name: Shelby Jordan MRN: [id number] Referring Physician: Quintella Reichert Location of Patient: [facility] Location of Provider: [place] Department  Toniesha Zellner is an 69 y.o. female. Pt presents to [facility] under IVC by her daughter for AVH as well as odd behaviors. TTS conducted assessment via telephone due to poor connection with TTS machine therfore a full MSE was not conducted. Pt states she is not sure why she is at hospital she was told by family members she was supposed to just speak with a counselor. Pt currently denies SI, HI, AVH, SIB and denies past SI attempts. Per EDP report: Patient has history of bipolar disorder.  She was recently seen at [facility].  Patient states she was supposed to follow-up with a behavioral health appointment today.  Patient states none of her family members will take her to the appointment.  Patient states her daughter became upset and IVC at her.  Patient was brought in by police.  According to the IVC paperwork patient has not been sleeping or attending to her personal hygiene.  Family members indicated the patient has been hallucinating and talking to herself.  According to family member she was standing outside on the porch from 2 AM to 6 AM talking to herself.  She became angry and aggressive with the family member who then placed on IVC.  Patient denies any SI or HI."  During assessment pt presents with pleasant tone/affect, logical speech and very alert. Pt reports getting 6 to 7 hours of sleep a night with a good appetite. Pt reports no current provider but states she is taking medications prescribed from her PCP (Lithium and Depakote). Pt denies all symptoms of depression, states , " Im a mild mannered person". Pt reports family history of mental illness on maternal side of family.  At this time pt denies access to weapons/violence and can contract for safety at this time. Pt did not present manic during  assessment, did not present to be responding to internal stimuli or delusional content, was cooperative, pleasant.   Per IVC: Respondent has history of of depression and Bipolar Mental illness, she has not been sleeping or tending to her personal hygiene. The respondent has been hallucinating, talks to herself and answers herself. She became very aggressive and agitated last night stood on porch from 2am to 6am talking to herself.   Collateral TTS attempted to call pt daughter who IVC her [name] at [phone], she did not answer left HIPPA compliant voicemail.   Diagnosis: Bipolar 1 disorder   Past Medical History:  Past Medical History:  Diagnosis Date  . Bipolar 1 disorder ([place])   . Hepatitis C   . Hypertension     Past Surgical History:  Procedure Laterality Date  . APPENDECTOMY    . TOE AMPUTATION      Family History: History reviewed. No pertinent family history.  Social History:  reports that she has never smoked. She has never used smokeless tobacco. She reports that she does not drink alcohol and does not use drugs.  Additional Social History:  Alcohol / Drug Use Pain Medications: see MAR Prescriptions: see MAR Over the Counter: see MAR  CIWA: CIWA-Ar BP: 125/82 Pulse Rate: 77 COWS:    Allergies: No Known Allergies  Home Medications: (Not in a hospital admission)   OB/GYN Status:  No LMP recorded. Patient is postmenopausal.  General Assessment Data Location of Assessment: [facility] TTS Assessment: In system Is this  a Tele or Face-to-Face Assessment?: Tele Assessment Is this an Initial Assessment or a Re-assessment for this encounter?: Initial Assessment Patient Accompanied by:: Other Language Other than English: No Living Arrangements: Other (Comment) What gender do you identify as?: Female Date Telepsych consult ordered in [facility]: 01/01/20 Marital status: Separated Pregnancy Status: No Living Arrangements: Children, Other relatives Can pt  return to current living arrangement?: Yes Admission Status: Involuntary     Crisis Care Plan Living Arrangements: Children, Other relatives Legal Guardian: Other: (self) Name of Psychiatrist: Reports of none Name of Therapist: Reports of none  Education Status Is patient currently in school?: No Highest grade of school patient has completed: 41 th and got your GED in [place] Is the patient employed, unemployed or receiving disability?: Receiving disability income  Risk to self with the past 6 months Suicidal Ideation: No Has patient been a risk to self within the past 6 months prior to admission? : No Suicidal Intent: No Has patient had any suicidal intent within the past 6 months prior to admission? : No Is patient at risk for suicide?: No Suicidal Plan?: No Has patient had any suicidal plan within the past 6 months prior to admission? : No Access to Means: No What has been your use of drugs/alcohol within the last 12 months?: none Previous Attempts/Gestures: No How many times?: 0 (pt denies) Other Self Harm Risks:  (trauma) Triggers for Past Attempts: None known Intentional Self Injurious Behavior: None Family Suicide History: No Recent stressful life event(s): Other (Comment) Persecutory voices/beliefs?: No Depression: No Depression Symptoms:  (pt denies) Substance abuse history and/or treatment for substance abuse?: No Suicide prevention information given to non-admitted patients: Not applicable  Risk to Others within the past 6 months Homicidal Ideation: No Does patient have any lifetime risk of violence toward others beyond the six months prior to admission? : No Thoughts of Harm to Others: No Current Homicidal Intent: No Current Homicidal Plan: No Access to Homicidal Means: No Identified Victim: none History of harm to others?: No Assessment of Violence: None Noted Violent Behavior Description: none Does patient have access to weapons?: No Criminal Charges  Pending?: No Does patient have a court date: No Is patient on probation?: No  Psychosis Hallucinations: None noted Delusions: None noted  Mental Status Report Appearance/Hygiene: Unremarkable, Unable to Assess Eye Contact: Unable to Assess Motor Activity: Unable to assess Speech: Logical/coherent Level of Consciousness: Quiet/awake Mood: Pleasant Affect: Appropriate to circumstance Anxiety Level: None Judgement: Partial Orientation: Person, Place, Time, Situation, Appropriate for developmental age Obsessive Compulsive Thoughts/Behaviors: None  Cognitive Functioning Concentration: Normal Memory: Recent Intact Is patient IDD: No Insight: Fair Impulse Control: Fair Appetite: Good Have you had any weight changes? : No Change Sleep: No Change Total Hours of Sleep: 7 Vegetative Symptoms: None  ADLScreening [facility] Assessment Services) Patient's cognitive ability adequate to safely complete daily activities?: Yes Patient able to express need for assistance with ADLs?: Yes Independently performs ADLs?: Yes (appropriate for developmental age)  Prior Inpatient Therapy Prior Inpatient Therapy: Yes Prior Therapy Dates: Multiple Hospitalizations  Prior Therapy Facilty/Provider(s): Multiple Hospitalizations  Reason for Treatment: Psychosis  Prior Outpatient Therapy Prior Outpatient Therapy:  (unknown)  ADL Screening (condition at time of admission) Patient's cognitive ability adequate to safely complete daily activities?: Yes Is the patient deaf or have difficulty hearing?: No Does the patient have difficulty seeing, even when wearing glasses/contacts?: No Does the patient have difficulty concentrating, remembering, or making decisions?: No Patient able to express need for assistance with  ADLs?: Yes Does the patient have difficulty dressing or bathing?: No Independently performs ADLs?: Yes (appropriate for developmental age) Does the patient have difficulty walking or climbing  stairs?: No Weakness of Legs: None Weakness of Arms/Hands: None  Home Assistive Devices/Equipment Home Assistive Devices/Equipment: None            Nutrition Screen- [facility] Adult/[facility] Patient's home diet: Regular        Disposition: Lindon Romp, FNP recommends pt for overnight observation, reassess in the morning. TTS confirm with nurse. Disposition Initial Assessment Completed for this Encounter: Yes  This service was provided via telemedicine using a 2-way, interactive audio and video technology.  Names of all persons participating in this telemedicine service and their role in this encounter. Name: Antony Contras Role: TTS  Name: [name] Role: Patient  Name:  Role:   Name:  Role:     Donato Heinz 01/02/2020 12:22 AM

## 2020-01-02 NOTE — ED Notes (Signed)
Discharge instructions reviewed with patient. Patient reports she will get a cab or call her friend to come pick her up. IVC papers have been resended and patient is not IVC anymore.

## 2020-01-02 NOTE — ED Notes (Signed)
Pt belongings are at nurse's station in front of room 17

## 2020-01-02 NOTE — BH Assessment (Signed)
[place] Assessment Progress Note  Per Hampton Abbot, MD, this pt does not require psychiatric hospitalization at this time.  Pt presents under IVC initiated by pt's daughter and initially upheld by EDP Dorie Rank, MD, but now rescinded by EDP Lacretia Leigh, MD.  Pt is to be discharged from [facility].  She intends to relocate to a different state.  For this reason, no local outpt referrals have been provided for pt.  Pt's nurse has been notified.  Jalene Mullet, [place] Triage Specialist [phone]

## 2020-01-02 NOTE — ED Notes (Signed)
Pt verbalized understanding of d/c papers. Pt returned knapsack, 2 plastic bags of belongings, and biohazard bag with cell phone, credit cards, and cash. Pt was very thankful for all assistance.

## 2020-01-02 NOTE — Consult Note (Signed)
  Shelby Jordan, 69 y.o., female patient seen face to face  by this provider, consulted with Dr. Dwyane Dee; and chart reviewed on 01/02/20.  On evaluation Shelby Jordan reports came to visit for [facility] Day and has plans to go back to [place] where she is currently living with her son.  Patient states that she was recently treated inpatient and had a follow up appointment that no one would take her to.  Patient states she has been compliant with medications with no adverse reactions.   During evaluation Shelby Jordan is alert/oriented x 4; calm/cooperative; and mood is congruent with affect.  She does not appear to be responding to internal/external stimuli or delusional thoughts.  Patient denies suicidal/self-harm/homicidal ideation, psychosis, and paranoia.  Patient answered question appropriately.     Recommendation:  Ordered lithium level since no level checked in 9 days.  Continue current psychotropic medications and Follow up with outpatient psychiatric services once back in [place].   Disposition:  Psychiatrically cleared No evidence of imminent risk to self or others at present.   Patient does not meet criteria for psychiatric inpatient admission. Supportive therapy provided about ongoing stressors. Discussed crisis plan, support from social network, calling 911, coming to the Emergency Department, and calling Suicide Hotline.

## 2020-01-19 ENCOUNTER — Other Ambulatory Visit: Payer: Self-pay | Admitting: Psychiatry

## 2020-02-14 ENCOUNTER — Other Ambulatory Visit: Payer: Self-pay | Admitting: Psychiatry

## 2020-02-28 ENCOUNTER — Other Ambulatory Visit: Payer: Self-pay | Admitting: Psychiatry

## 2020-09-22 ENCOUNTER — Other Ambulatory Visit: Payer: Self-pay | Admitting: Psychiatry

## 2020-09-24 ENCOUNTER — Emergency Department (HOSPITAL_COMMUNITY)
Admission: EM | Admit: 2020-09-24 | Discharge: 2020-09-26 | Disposition: A | Discharge status: Home / Self Care | Payer: Medicare Other | Attending: Emergency Medicine | Admitting: Emergency Medicine

## 2020-09-24 ENCOUNTER — Emergency Department (HOSPITAL_COMMUNITY): Payer: Medicare Other

## 2020-09-24 DIAGNOSIS — F22 Delusional disorders: Secondary | ICD-10-CM | POA: Diagnosis not present

## 2020-09-24 DIAGNOSIS — Z20822 Contact with and (suspected) exposure to covid-19: Secondary | ICD-10-CM | POA: Diagnosis not present

## 2020-09-24 DIAGNOSIS — F3110 Bipolar disorder, current episode manic without psychotic features, unspecified: Secondary | ICD-10-CM | POA: Diagnosis not present

## 2020-09-24 DIAGNOSIS — Z79899 Other long term (current) drug therapy: Secondary | ICD-10-CM | POA: Diagnosis not present

## 2020-09-24 DIAGNOSIS — F3113 Bipolar disorder, current episode manic without psychotic features, severe: Secondary | ICD-10-CM | POA: Diagnosis not present

## 2020-09-24 DIAGNOSIS — R0789 Other chest pain: Secondary | ICD-10-CM | POA: Diagnosis not present

## 2020-09-24 DIAGNOSIS — I10 Essential (primary) hypertension: Secondary | ICD-10-CM | POA: Diagnosis not present

## 2020-09-24 LAB — SALICYLATE LEVEL: Salicylate Lvl: <7 mg/dL — ABNORMAL LOW (ref 7.0–30.0)

## 2020-09-24 LAB — CBC WITH DIFFERENTIAL/PLATELET
Abs Immature Granulocytes: 0.05 10*3/uL (ref 0.00–0.07)
Basophils Absolute: 0 10*3/uL (ref 0.0–0.1)
Basophils Relative: 0 %
Eosinophils Absolute: 0.1 10*3/uL (ref 0.0–0.5)
Eosinophils Relative: 1 %
HCT: 37.4 % (ref 36.0–46.0)
Hemoglobin: 12.2 g/dL (ref 12.0–15.0)
Immature Granulocytes: 1 %
Lymphocytes Relative: 17 %
Lymphs Abs: 1.6 10*3/uL (ref 0.7–4.0)
MCH: 29.1 pg (ref 26.0–34.0)
MCHC: 32.6 g/dL (ref 30.0–36.0)
MCV: 89.3 fL (ref 80.0–100.0)
Monocytes Absolute: 0.6 10*3/uL (ref 0.1–1.0)
Monocytes Relative: 7 %
Neutro Abs: 6.9 10*3/uL (ref 1.7–7.7)
Neutrophils Relative %: 74 %
Platelets: 265 10*3/uL (ref 150–400)
RBC: 4.19 MIL/uL (ref 3.87–5.11)
RDW: 15.4 % (ref 11.5–15.5)
WBC: 9.3 10*3/uL (ref 4.0–10.5)
nRBC: 0 % (ref 0.0–0.2)

## 2020-09-24 LAB — COMPREHENSIVE METABOLIC PANEL
ALT: 15 U/L (ref 0–44)
AST: 20 U/L (ref 15–41)
Albumin: 4.4 g/dL (ref 3.5–5.0)
Alkaline Phosphatase: 41 U/L (ref 38–126)
Anion gap: 9 (ref 5–15)
BUN: 15 mg/dL (ref 8–23)
CO2: 23 mmol/L (ref 22–32)
Calcium: 9.4 mg/dL (ref 8.9–10.3)
Chloride: 105 mmol/L (ref 98–111)
Creatinine, Ser: 1.09 mg/dL — ABNORMAL HIGH (ref 0.44–1.00)
GFR, Estimated: 55 mL/min — ABNORMAL LOW (ref >60)
Glucose, Bld: 110 mg/dL — ABNORMAL HIGH (ref 70–99)
Potassium: 4.2 mmol/L (ref 3.5–5.1)
Sodium: 137 mmol/L (ref 135–145)
Total Bilirubin: 0.6 mg/dL (ref 0.3–1.2)
Total Protein: 7.6 g/dL (ref 6.5–8.1)

## 2020-09-24 LAB — TROPONIN I (HIGH SENSITIVITY)
Troponin I (High Sensitivity): 8 ng/L (ref <18)
Troponin I (High Sensitivity): 9 ng/L (ref <18)

## 2020-09-24 LAB — LITHIUM LEVEL: Lithium Lvl: 0.24 mmol/L — ABNORMAL LOW (ref 0.60–1.20)

## 2020-09-24 LAB — RAPID URINE DRUG SCREEN, HOSP PERFORMED
Amphetamines: NOT DETECTED
Barbiturates: NOT DETECTED
Benzodiazepines: POSITIVE — AB
Cocaine: NOT DETECTED
Opiates: NOT DETECTED
Tetrahydrocannabinol: NOT DETECTED

## 2020-09-24 LAB — ETHANOL: Alcohol, Ethyl (B): <10 mg/dL (ref <10)

## 2020-09-24 LAB — RESP PANEL BY RT-PCR (FLU A&B, COVID) ARPGX2
Influenza A by PCR: NEGATIVE
Influenza B by PCR: NEGATIVE
SARS Coronavirus 2 by RT PCR: NEGATIVE

## 2020-09-24 LAB — ACETAMINOPHEN LEVEL: Acetaminophen (Tylenol), Serum: <10 ug/mL — ABNORMAL LOW (ref 10–30)

## 2020-09-24 LAB — VALPROIC ACID LEVEL: Valproic Acid Lvl: 73 ug/mL (ref 50.0–100.0)

## 2020-09-24 MED ORDER — DIVALPROEX SODIUM 500 MG PO DR TAB
500.0000 mg | DELAYED_RELEASE_TABLET | Freq: Three times a day (TID) | ORAL | Status: DC
  Administered 2020-09-24 – 2020-09-26 (×5): 500 mg via ORAL
  Filled 2020-09-24 (×5): qty 1

## 2020-09-24 MED ORDER — AMLODIPINE BESYLATE 5 MG PO TABS
10.0000 mg | ORAL_TABLET | Freq: Every day | ORAL | Status: DC
  Administered 2020-09-24 – 2020-09-26 (×3): 10 mg via ORAL
  Filled 2020-09-24 (×3): qty 2

## 2020-09-24 MED ORDER — ASPIRIN 81 MG PO CHEW
324.0000 mg | CHEWABLE_TABLET | Freq: Once | ORAL | Status: AC
  Administered 2020-09-24: 324 mg via ORAL
  Filled 2020-09-24: qty 4

## 2020-09-24 MED ORDER — METOPROLOL SUCCINATE ER 50 MG PO TB24
25.0000 mg | ORAL_TABLET | Freq: Every day | ORAL | Status: DC
  Administered 2020-09-24 – 2020-09-26 (×3): 25 mg via ORAL
  Filled 2020-09-24 (×3): qty 1

## 2020-09-24 MED ORDER — ATORVASTATIN CALCIUM 10 MG PO TABS
20.0000 mg | ORAL_TABLET | Freq: Every day | ORAL | Status: DC
  Administered 2020-09-24 – 2020-09-26 (×3): 20 mg via ORAL
  Filled 2020-09-24 (×3): qty 2

## 2020-09-24 MED ORDER — TEMAZEPAM 15 MG PO CAPS
30.0000 mg | ORAL_CAPSULE | Freq: Every evening | ORAL | Status: DC | PRN
  Administered 2020-09-24 – 2020-09-25 (×2): 30 mg via ORAL
  Filled 2020-09-24 (×2): qty 2

## 2020-09-24 MED ORDER — LISINOPRIL 20 MG PO TABS
40.0000 mg | ORAL_TABLET | Freq: Every day | ORAL | Status: DC
  Administered 2020-09-24 – 2020-09-26 (×3): 40 mg via ORAL
  Filled 2020-09-24 (×3): qty 2

## 2020-09-24 MED ORDER — LORAZEPAM 1 MG PO TABS
1.0000 mg | ORAL_TABLET | Freq: Once | ORAL | Status: AC
  Administered 2020-09-24: 1 mg via ORAL
  Filled 2020-09-24: qty 1

## 2020-09-24 MED ORDER — DIPHENHYDRAMINE HCL 25 MG PO CAPS
25.0000 mg | ORAL_CAPSULE | Freq: Every evening | ORAL | Status: DC | PRN
  Administered 2020-09-24 – 2020-09-25 (×2): 25 mg via ORAL
  Filled 2020-09-24 (×2): qty 1

## 2020-09-24 MED ORDER — LITHIUM CARBONATE ER 300 MG PO TBCR
300.0000 mg | EXTENDED_RELEASE_TABLET | Freq: Every day | ORAL | Status: DC
  Administered 2020-09-24 – 2020-09-25 (×2): 300 mg via ORAL
  Filled 2020-09-24 (×2): qty 1

## 2020-09-24 NOTE — ED Triage Notes (Signed)
Pt to [facility] by EMS. Pt having manic episode. Pt having hallucinations and paranoid. Pt cooperative, no s/s of distress.

## 2020-09-24 NOTE — ED Provider Notes (Signed)
[facility] DEPT Provider Note   CSN: [id number] Arrival date & time: 09/24/20  1729     History Chief Complaint  Patient presents with  . Medical Clearance    Shelby Jordan is a 70 y.o. female.  HPI 70 year old female brought in for manic episode.  History is from the nurse who spoke to EMS.  Apparently the daughter states she is been manic and paranoid for a couple days.  The patient endorses that people have been trying to break into her house and bathroom.  The patient has apparently been walking her bathroom.  She endorses a chronic headache that she states is from poorly controlled hypertension as well as on and off sharp chest pain for about a month.  Endorses compliance with her blood pressure meds.  Past Medical History:  Diagnosis Date  . Bipolar 1 disorder ([place])   . Hepatitis C   . Hypertension     Patient Active Problem List   Diagnosis Date Noted  . Bipolar disorder (manic depression) ([place]) 12/15/2019  . Bipolar I disorder, most recent episode (or current) manic ([place]) 12/15/2019  . Essential hypertension 12/15/2019    Past Surgical History:  Procedure Laterality Date  . APPENDECTOMY    . TOE AMPUTATION       OB History   No obstetric history on file.     No family history on file.  Social History   Tobacco Use  . Smoking status: Never Smoker  . Smokeless tobacco: Never Used  Substance Use Topics  . Alcohol use: Never  . Drug use: Never    Home Medications Prior to Admission medications   Medication Sig Start Date End Date Taking? Authorizing Provider  amLODipine (NORVASC) 10 MG tablet Take 1 tablet (10 mg total) by mouth daily. 12/27/19  Yes Clapacs, Madie Reno, MD  atorvastatin (LIPITOR) 20 MG tablet Take 20 mg by mouth daily.   Yes [provider]  diphenhydrAMINE (BENADRYL) 25 mg capsule Take 1 capsule (25 mg total) by mouth at bedtime as needed for sleep. 12/27/19  Yes Clapacs, Madie Reno, MD  divalproex  (DEPAKOTE) 500 MG DR tablet Take 1 tablet (500 mg total) by mouth 3 (three) times daily. Take 1 in the morning, and 2 at night Patient taking differently: Take 500 mg by mouth 2 (two) times daily. 12/27/19  Yes Clapacs, Madie Reno, MD  Ibuprofen-diphenhydrAMINE Cit (ADVIL PM) 200-38 MG TABS Take 2 tablets by mouth daily as needed (pain).   Yes [provider]  lisinopril (ZESTRIL) 40 MG tablet Take 1 tablet (40 mg total) by mouth daily. 12/27/19  Yes Clapacs, Madie Reno, MD  lithium carbonate (LITHOBID) 300 MG CR tablet Take 1 tablet (300 mg total) by mouth at bedtime. 12/27/19  Yes Clapacs, Madie Reno, MD  metoprolol succinate (TOPROL-XL) 25 MG 24 hr tablet Take 1 tablet (25 mg total) by mouth daily. Patient not taking: No sig reported 12/28/19   Clapacs, Madie Reno, MD  temazepam (RESTORIL) 30 MG capsule Take 1 capsule (30 mg total) by mouth at bedtime as needed for sleep. Patient not taking: No sig reported 12/27/19   Clapacs, Madie Reno, MD    Allergies    Tomato (diagnostic)  Review of Systems   Review of Systems  Constitutional: Negative for fever.  Respiratory: Negative for cough.   Cardiovascular: Positive for chest pain.  Neurological: Positive for headaches.  All other systems reviewed and are negative.   Physical Exam Updated Vital Signs BP Marland Kitchen)  191/98 (BP Location: Right Arm)   Pulse 97   Temp 98.9 F (37.2 C) (Oral)   Resp 16   SpO2 96%   Physical Exam Vitals and nursing note reviewed.  Constitutional:      Appearance: She is well-developed and well-nourished.  HENT:     Head: Normocephalic and atraumatic.     Right Ear: External ear normal.     Left Ear: External ear normal.     Nose: Nose normal.  Eyes:     General:        Right eye: No discharge.        Left eye: No discharge.     Extraocular Movements: Extraocular movements intact.  Cardiovascular:     Rate and Rhythm: Normal rate and regular rhythm.     Heart sounds: Normal heart sounds.  Pulmonary:     Effort:  Pulmonary effort is normal.     Breath sounds: Normal breath sounds.  Abdominal:     Palpations: Abdomen is soft.     Tenderness: There is no abdominal tenderness.  Skin:    General: Skin is warm and dry.  Neurological:     Mental Status: She is alert.     Comments: CN 3-12 grossly intact. 5/5 strength in all 4 extremities. Grossly normal sensation. Normal finger to nose.   Psychiatric:        Mood and Affect: Mood is not anxious.        Thought Content: Thought content is paranoid.     ED Results / Procedures / Treatments   Labs (all labs ordered are listed, but only abnormal results are displayed) Labs Reviewed  COMPREHENSIVE METABOLIC PANEL - Abnormal; Notable for the following components:      Result Value   Glucose, Bld 110 (*)    Creatinine, Ser 1.09 (*)    GFR, Estimated 55 (*)    All other components within normal limits  ACETAMINOPHEN LEVEL - Abnormal; Notable for the following components:   Acetaminophen (Tylenol), Serum <10 (*)    All other components within normal limits  SALICYLATE LEVEL - Abnormal; Notable for the following components:   Salicylate Lvl <1.6 (*)    All other components within normal limits  RAPID URINE DRUG SCREEN, HOSP PERFORMED - Abnormal; Notable for the following components:   Benzodiazepines POSITIVE (*)    All other components within normal limits  LITHIUM LEVEL - Abnormal; Notable for the following components:   Lithium Lvl 0.24 (*)    All other components within normal limits  RESP PANEL BY RT-PCR (FLU A&B, COVID) ARPGX2  CBC WITH DIFFERENTIAL/PLATELET  ETHANOL  VALPROIC ACID LEVEL  TROPONIN I (HIGH SENSITIVITY)  TROPONIN I (HIGH SENSITIVITY)    EKG EKG Interpretation  Date/Time:  Tuesday September 24 2020 18:05:55 EST Ventricular Rate:  87 PR Interval:  160 QRS Duration: 130 QT Interval:  408 QTC Calculation: 490 R Axis:   25 Text Interpretation: Normal sinus rhythm Right bundle branch block similar to May 2021 Confirmed by  Sherwood Gambler [phone]) on 09/24/2020 6:20:06 PM   Radiology DG Chest 2 View  Result Date: 09/24/2020 CLINICAL DATA:  Chest pain, hypertension, manic episode, hallucinations, paranoia EXAM: CHEST - 2 VIEW COMPARISON:  None. FINDINGS: Frontal and lateral views of the chest demonstrate an unremarkable cardiac silhouette. Lungs are hyperinflated with increased interstitial prominence compatible with emphysema. No airspace disease, effusion, or pneumothorax. No acute bony abnormalities. IMPRESSION: 1. Emphysema.  No acute process. Electronically Signed   By: Legrand Como  Owens Shark M.D.   On: 09/24/2020 19:21    Procedures Procedures   Medications Ordered in ED Medications  amLODipine (NORVASC) tablet 10 mg (10 mg Oral Given 09/24/20 1818)  lisinopril (ZESTRIL) tablet 40 mg (40 mg Oral Given 09/24/20 1820)  metoprolol succinate (TOPROL-XL) 24 hr tablet 25 mg (25 mg Oral Given 09/24/20 1818)  lithium carbonate (LITHOBID) CR tablet 300 mg (300 mg Oral Given 09/24/20 2119)  temazepam (RESTORIL) capsule 30 mg (30 mg Oral Given 09/24/20 2119)  atorvastatin (LIPITOR) tablet 20 mg (20 mg Oral Given 09/24/20 2119)  diphenhydrAMINE (BENADRYL) capsule 25 mg (25 mg Oral Given 09/24/20 2119)  divalproex (DEPAKOTE) DR tablet 500 mg (500 mg Oral Given 09/24/20 2118)  aspirin chewable tablet 324 mg (324 mg Oral Given 09/24/20 1817)    ED Course  I have reviewed the triage vital signs and the nursing notes.  Pertinent labs & imaging results that were available during my care of the patient were reviewed by me and considered in my medical decision making (see chart for details).    MDM Rules/Calculators/A&P                          Patient appears medically stable at this time. I don't think she's been taking her meds, and so her BP should come down with restarting her medicines. Otherwise, troponins are negative. She has a benign neuro exam. Appears stable for psychiatric consultation and admission.  Final Clinical Impression(s) /  ED Diagnoses Final diagnoses:  Paranoid [facility])    Rx / DC Orders ED Discharge Orders    None       Sherwood Gambler, MD 09/24/20 2304

## 2020-09-25 DIAGNOSIS — F22 Delusional disorders: Secondary | ICD-10-CM | POA: Diagnosis not present

## 2020-09-25 DIAGNOSIS — F3110 Bipolar disorder, current episode manic without psychotic features, unspecified: Secondary | ICD-10-CM

## 2020-09-25 NOTE — Consult Note (Addendum)
[facility] Face-to-Face Psychiatry Consult   Reason for Consult:  Manic behavior, paranoia and hallucinations Referring Physician:  Sherwood Gambler, MD Patient Identification: Shelby Jordan MRN:  [id number] Principal Diagnosis: Bipolar I disorder, most recent episode (or current) manic ([place]) Diagnosis:  Principal Problem:   Bipolar I disorder, most recent episode (or current) manic ([place])   Total Time spent with patient: 30 minutes  Subjective:   Shelby Jordan is a 70 y.o. female patient admitted to [facility] ED after presenting via law enforcement under IVC by her daughter with concern that patient leaves home with stove on, hearing voices from bathroom, and not taking her medications.  HPI:  Shelby Jordan, 70 y.o., female patient seen face to face by this provider, consulted with Dr. Hampton Abbot; and chart reviewed on 09/25/20.  On evaluation Shelby Jordan reports she was brought to the hospital because "My daughter said I hadn't slept in 48 hours.  The she told me to double up on my sleep medicine, I wasn't going to do that.  Then she gets mad cause I lock the bathroom door.  I lock the door out of respect for her boyfriend everybody don't knock before coming in."  Patient states she isn't having any suicidal thoughts "I'm 70 yrs old.  Out of all the shit I been through why would I wait until I'm 70 yrs old to try and kill myself."  Patient also denies auditory/visual hallucinations "No I'm not; I ain't on no drugs so, shouldn't be."  Patient states her last psychiatric admission was at [facility]  Patient states she is compliant with her medications.  Valproic Acid level was at therapeutic level of 73.  Patient gave permission to speak with her daughter whom she lives with but did not know the phone number.   During evaluation Shelby Jordan is sitting on side of bed in no acute distress.  She is alert, oriented x 4, calm and cooperative.  Patient also seen interacting appropriately with staff.   Patients conversation is tangential, communication wonders from topic to topic, lack of focus and may not return to original topic or answer question.  Her mood is euthymic with congruent affect.  She does not appear to be responding to internal/external stimuli or delusional thoughts.  Patient denies suicidal/self-harm/homicidal ideation, psychosis, and paranoia.  Patient answered question appropriately.  Collateral Information:  Spoke to patients daughter Shelby Jordan at [phone].  Ms. Thompkins states that patient hasn't been sleeping up at all time of night screaming.  "She is living the stove on pots are burning; yesterday she left didn't tell anybody where she was going.  She missed her appointment yesterday and she hasn't been taking her medication."  Informed Ms. Thompkins that patient has been taking Depakote and it was at therapeutic levels.  Patient has been calm, cooperative, and interacting appropriately.  Ms. Thompkins states one day at the hospital is not going to make her better.  I have kids in the hose and I don't mind providing her shelter, feeding her but when she is manic I can't deal with it.  [facility] wanted to have some labs checked.  Informed house may need to be safety proof, or patient may need assistance which would be more psychosocial.  Ms. Thompkins also states that a nodule was found "on her left side of stomach; I want to say the kidney area.  It's been there for about a month and it hard to get her to go to the doctor.  Can't she  have that checked out while she is there."  Ms. Thompkins refuses to pick up patient today stating that it takes more than one day for patient to come down from a manic episode and she can't deal with it when patient is manic.  Informed would call Beverly Sessions, would also order social work consult and inform emergency room doctor of nodule.    Past Psychiatric History: Bipolar disorder  Risk to Self:   Risk to Others:   Prior Inpatient Therapy:    Prior Outpatient Therapy:    Past Medical History:  Past Medical History:  Diagnosis Date  . Bipolar 1 disorder ([place])   . Hepatitis C   . Hypertension     Past Surgical History:  Procedure Laterality Date  . APPENDECTOMY    . TOE AMPUTATION     Family History: No family history on file. Family Psychiatric  History: None reported Social History:  Social History   Substance and Sexual Activity  Alcohol Use Never     Social History   Substance and Sexual Activity  Drug Use Never    Social History   Socioeconomic History  . Marital status: Legally Separated    Spouse name: Not on file  . Number of children: Not on file  . Years of education: Not on file  . Highest education level: Not on file  Occupational History  . Not on file  Tobacco Use  . Smoking status: Never Smoker  . Smokeless tobacco: Never Used  Substance and Sexual Activity  . Alcohol use: Never  . Drug use: Never  . Sexual activity: Not on file  Other Topics Concern  . Not on file  Social History Narrative  . Not on file   Social Determinants of Health   Financial Resource Strain: Not on file  Food Insecurity: Not on file  Transportation Needs: Not on file  Physical Activity: Not on file  Stress: Not on file  Social Connections: Not on file   Additional Social History:    Allergies:   Allergies  Allergen Reactions  . Tomato (Diagnostic) Other (See Comments)    Tongue break out.     Labs:  Results for orders placed or performed during the hospital encounter of 09/24/20 (from the past 48 hour(s))  Urine rapid drug screen (hosp performed)     Status: Abnormal   Collection Time: 09/24/20  6:09 PM  Result Value Ref Range   Opiates NONE DETECTED NONE DETECTED   Cocaine NONE DETECTED NONE DETECTED   Benzodiazepines POSITIVE (A) NONE DETECTED   Amphetamines NONE DETECTED NONE DETECTED   Tetrahydrocannabinol NONE DETECTED NONE DETECTED   Barbiturates NONE DETECTED NONE DETECTED     Comment: (NOTE) DRUG SCREEN FOR MEDICAL PURPOSES ONLY.  IF CONFIRMATION IS NEEDED FOR ANY PURPOSE, NOTIFY LAB WITHIN 5 DAYS.  LOWEST DETECTABLE LIMITS FOR URINE DRUG SCREEN Drug Class                     Cutoff (ng/mL) Amphetamine and metabolites    1000 Barbiturate and metabolites    200 Benzodiazepine                 235 Tricyclics and metabolites     300 Opiates and metabolites        300 Cocaine and metabolites        300 THC  50 Performed at [facility], [place] [address] [place], [place] [address]   Comprehensive metabolic panel     Status: Abnormal   Collection Time: 09/24/20  6:43 PM  Result Value Ref Range   Sodium 137 135 - 145 mmol/L   Potassium 4.2 3.5 - 5.1 mmol/L   Chloride 105 98 - 111 mmol/L   CO2 23 22 - 32 mmol/L   Glucose, Bld 110 (H) 70 - 99 mg/dL    Comment: Glucose reference range applies only to samples taken after fasting for at least 8 hours.   BUN 15 8 - 23 mg/dL   Creatinine, Ser 1.09 (H) 0.44 - 1.00 mg/dL   Calcium 9.4 8.9 - 10.3 mg/dL   Total Protein 7.6 6.5 - 8.1 g/dL   Albumin 4.4 3.5 - 5.0 g/dL   AST 20 15 - 41 U/L   ALT 15 0 - 44 U/L   Alkaline Phosphatase 41 38 - 126 U/L   Total Bilirubin 0.6 0.3 - 1.2 mg/dL   GFR, Estimated 55 (L) >60 mL/min    Comment: (NOTE) Calculated using the CKD-EPI Creatinine Equation (2021)    Anion gap 9 5 - 15    Comment: Performed at [facility], [place] [address], [place], [place] [address]  Acetaminophen level     Status: Abnormal   Collection Time: 09/24/20  6:43 PM  Result Value Ref Range   Acetaminophen (Tylenol), Serum <10 (L) 10 - 30 ug/mL    Comment: (NOTE) Therapeutic concentrations vary significantly. A range of 10-30 ug/mL  may be an effective concentration for many patients. However, some  are best treated at concentrations outside of this range. Acetaminophen concentrations >150 ug/mL at 4 hours after ingestion  and >50 ug/mL at  12 hours after ingestion are often associated with  toxic reactions.  Performed at [facility], [place] [address] [place], [place] [address]   Salicylate level     Status: Abnormal   Collection Time: 09/24/20  6:43 PM  Result Value Ref Range   Salicylate Lvl <8.4 (L) 7.0 - 30.0 mg/dL    Comment: Performed at [facility], [place] [address] [place], [place] [address]  Troponin I (High Sensitivity)     Status: None   Collection Time: 09/24/20  6:43 PM  Result Value Ref Range   Troponin I (High Sensitivity) 8 <18 ng/L    Comment: (NOTE) Elevated high sensitivity troponin I (hsTnI) values and significant  changes across serial measurements may suggest ACS but many other  chronic and acute conditions are known to elevate hsTnI results.  Refer to the "Links" section for chest pain algorithms and additional  guidance. Performed at [facility], [place] [address] [place], [place] [address]   CBC with Differential     Status: None   Collection Time: 09/24/20  6:43 PM  Result Value Ref Range   WBC 9.3 4.0 - 10.5 K/uL   RBC 4.19 3.87 - 5.11 MIL/uL   Hemoglobin 12.2 12.0 - 15.0 g/dL   HCT 37.4 36.0 - 46.0 %   MCV 89.3 80.0 - 100.0 fL   MCH 29.1 26.0 - 34.0 pg   MCHC 32.6 30.0 - 36.0 g/dL   RDW 15.4 11.5 - 15.5 %   Platelets 265 150 - 400 K/uL   nRBC 0.0 0.0 - 0.2 %   Neutrophils Relative % 74 %   Neutro Abs 6.9 1.7 - 7.7 K/uL   Lymphocytes Relative  17 %   Lymphs Abs 1.6 0.7 - 4.0 K/uL   Monocytes Relative 7 %   Monocytes Absolute 0.6 0.1 - 1.0 K/uL   Eosinophils Relative 1 %   Eosinophils Absolute 0.1 0.0 - 0.5 K/uL   Basophils Relative 0 %   Basophils Absolute 0.0 0.0 - 0.1 K/uL   Immature Granulocytes 1 %   Abs Immature Granulocytes 0.05 0.00 - 0.07 K/uL    Comment: Performed at [facility], [place] [address], [place], [place] [address]  Ethanol     Status: None   Collection Time: 09/24/20  6:43 PM  Result  Value Ref Range   Alcohol, Ethyl (B) <10 <10 mg/dL    Comment: (NOTE) Lowest detectable limit for serum alcohol is 10 mg/dL.  For medical purposes only. Performed at [facility], [place] [address] [place], [place] [address]   Valproic acid level     Status: None   Collection Time: 09/24/20  6:43 PM  Result Value Ref Range   Valproic Acid Lvl 73 50.0 - 100.0 ug/mL    Comment: Performed at [facility], [place] [address], [place], [place] [address]  Resp Panel by RT-PCR (Flu A&B, Covid) Nasopharyngeal Swab     Status: None   Collection Time: 09/24/20  8:18 PM   Specimen: Nasopharyngeal Swab; Nasopharyngeal(NP) swabs in vial transport medium  Result Value Ref Range   SARS Coronavirus 2 by RT PCR NEGATIVE NEGATIVE    Comment: (NOTE) SARS-CoV-2 target nucleic acids are NOT DETECTED.  The SARS-CoV-2 RNA is generally detectable in upper respiratory specimens during the acute phase of infection. The lowest concentration of SARS-CoV-2 viral copies this assay can detect is 138 copies/mL. A negative result does not preclude SARS-Cov-2 infection and should not be used as the sole basis for treatment or other patient management decisions. A negative result may occur with  improper specimen collection/handling, submission of specimen other than nasopharyngeal swab, presence of viral mutation(s) within the areas targeted by this assay, and inadequate number of viral copies(<138 copies/mL). A negative result must be combined with clinical observations, patient history, and epidemiological information. The expected result is Negative.  Fact Sheet for Patients:  EntrepreneurPulse.com.au  Fact Sheet for Healthcare Providers:  IncredibleEmployment.be  This test is no t yet approved or cleared by the [place] FDA and  has been authorized for detection and/or diagnosis of SARS-CoV-2 by FDA under an Emergency Use  Authorization (EUA). This EUA will remain  in effect (meaning this test can be used) for the duration of the COVID-19 declaration under Section 564(b)(1) of the Act, 21 U.S.C.section 360bbb-3(b)(1), unless the authorization is terminated  or revoked sooner.       Influenza A by PCR NEGATIVE NEGATIVE   Influenza B by PCR NEGATIVE NEGATIVE    Comment: (NOTE) The Xpert Xpress SARS-CoV-2/FLU/RSV plus assay is intended as an aid in the diagnosis of influenza from Nasopharyngeal swab specimens and should not be used as a sole basis for treatment. Nasal washings and aspirates are unacceptable for Xpert Xpress SARS-CoV-2/FLU/RSV testing.  Fact Sheet for Patients: EntrepreneurPulse.com.au  Fact Sheet for Healthcare Providers: IncredibleEmployment.be  This test is not yet approved or cleared by the [place] FDA and has been authorized for detection and/or diagnosis of SARS-CoV-2 by FDA under an Emergency Use Authorization (EUA). This EUA will remain in effect (meaning this test can be used) for the duration of the COVID-19 declaration under Section 564(b)(1) of the Act, 21 U.S.C.  section 360bbb-3(b)(1), unless the authorization is terminated or revoked.  Performed at [facility], [place] [address] [place], [place] [address]   Lithium level     Status: Abnormal   Collection Time: 09/24/20  8:18 PM  Result Value Ref Range   Lithium Lvl 0.24 (L) 0.60 - 1.20 mmol/L    Comment: Performed at [facility], [place] [address], [place], [place] [address]  Troponin I (High Sensitivity)     Status: None   Collection Time: 09/24/20  8:18 PM  Result Value Ref Range   Troponin I (High Sensitivity) 9 <18 ng/L    Comment: (NOTE) Elevated high sensitivity troponin I (hsTnI) values and significant  changes across serial measurements may suggest ACS but many other  chronic and acute conditions are known to elevate hsTnI  results.  Refer to the "Links" section for chest pain algorithms and additional  guidance. Performed at [facility], [place] [address] [place], [place] [address]     Current Facility-Administered Medications  Medication Dose Route Frequency Provider Last Rate Last Admin  . amLODipine (NORVASC) tablet 10 mg  10 mg Oral Daily Sherwood Gambler, MD   10 mg at 09/25/20 0859  . atorvastatin (LIPITOR) tablet 20 mg  20 mg Oral Daily Sherwood Gambler, MD   20 mg at 09/25/20 0859  . diphenhydrAMINE (BENADRYL) capsule 25 mg  25 mg Oral QHS PRN Sherwood Gambler, MD   25 mg at 09/24/20 2119  . divalproex (DEPAKOTE) DR tablet 500 mg  500 mg Oral TID Sherwood Gambler, MD   500 mg at 09/25/20 0859  . lisinopril (ZESTRIL) tablet 40 mg  40 mg Oral Daily Sherwood Gambler, MD   40 mg at 09/25/20 0859  . lithium carbonate (LITHOBID) CR tablet 300 mg  300 mg Oral QHS Sherwood Gambler, MD   300 mg at 09/24/20 2119  . metoprolol succinate (TOPROL-XL) 24 hr tablet 25 mg  25 mg Oral Daily Sherwood Gambler, MD   25 mg at 09/25/20 0858  . temazepam (RESTORIL) capsule 30 mg  30 mg Oral QHS PRN Sherwood Gambler, MD   30 mg at 09/24/20 2119   Current Outpatient Medications  Medication Sig Dispense Refill  . amLODipine (NORVASC) 10 MG tablet Take 1 tablet (10 mg total) by mouth daily. 30 tablet 1  . atorvastatin (LIPITOR) 20 MG tablet Take 20 mg by mouth daily.    . diphenhydrAMINE (BENADRYL) 25 mg capsule Take 1 capsule (25 mg total) by mouth at bedtime as needed for sleep. 30 capsule 1  . divalproex (DEPAKOTE) 500 MG DR tablet Take 1 tablet (500 mg total) by mouth 3 (three) times daily. Take 1 in the morning, and 2 at night (Patient taking differently: Take 500 mg by mouth 2 (two) times daily.) 60 tablet 1  . Ibuprofen-diphenhydrAMINE Cit (ADVIL PM) 200-38 MG TABS Take 2 tablets by mouth daily as needed (pain).    Marland Kitchen lisinopril (ZESTRIL) 40 MG tablet Take 1 tablet (40 mg total) by mouth daily. 30 tablet 1  .  lithium carbonate (LITHOBID) 300 MG CR tablet Take 1 tablet (300 mg total) by mouth at bedtime. 30 tablet 1  . metoprolol succinate (TOPROL-XL) 25 MG 24 hr tablet Take 1 tablet (25 mg total) by mouth daily. (Patient not taking: No sig reported) 30 tablet 1  . temazepam (RESTORIL) 30 MG capsule Take 1 capsule (30 mg total) by mouth at bedtime as needed for sleep. (Patient not taking: No sig reported)  30 capsule 1    Musculoskeletal: Strength & Muscle Tone: within normal limits Gait & Station: normal Patient leans: N/A   Psychiatric Specialty Exam:  Presentation  General Appearance: No data recorded Eye Contact:No data recorded Speech:No data recorded Speech Volume:No data recorded Handedness:No data recorded  Mood and Affect  Mood:No data recorded Affect:No data recorded  Thought Process  Thought Processes:No data recorded Descriptions of Associations:No data recorded Orientation:No data recorded Thought Content:No data recorded History of Schizophrenia/Schizoaffective disorder:No  Duration of Psychotic Symptoms:No data recorded Hallucinations:No data recorded Ideas of Reference:No data recorded Suicidal Thoughts:No data recorded Homicidal Thoughts:No data recorded  Sensorium  Memory:No data recorded Judgment:No data recorded Insight:No data recorded  Executive Functions  Concentration:No data recorded Attention Span:No data recorded Recall:No data recorded Fund of Knowledge:No data recorded Language:No data recorded  Psychomotor Activity  Psychomotor Activity:No data recorded  Assets  Assets:No data recorded  Sleep  Sleep:No data recorded  Physical Exam: Physical Exam Vitals and nursing note reviewed. Chaperone present: Actuary.  Constitutional:      General: She is not in acute distress.    Appearance: Normal appearance. She is obese. She is not ill-appearing.  HENT:     Head: Normocephalic.  Eyes:     Pupils: Pupils are equal, round, and reactive to  light.  Cardiovascular:     Rate and Rhythm: Normal rate.  Pulmonary:     Effort: Pulmonary effort is normal.  Musculoskeletal:        General: Normal range of motion.  Skin:    General: Skin is warm and dry.  Neurological:     Mental Status: She is alert and oriented to person, place, and time.  Psychiatric:        Attention and Perception: Attention and perception normal. She does not perceive auditory or visual hallucinations.        Mood and Affect: Mood and affect normal.        Speech: Speech normal.        Behavior: Behavior normal. Behavior is cooperative.        Thought Content: Thought content normal. Thought content is not paranoid or delusional. Thought content does not include homicidal or suicidal ideation.        Cognition and Memory: Cognition and memory normal.        Judgment: Judgment normal.    Review of Systems  Constitutional: Negative.   HENT: Negative.   Eyes: Negative.   Respiratory: Negative.   Cardiovascular: Negative.   Gastrointestinal: Negative.   Genitourinary: Negative.   Musculoskeletal: Negative.   Skin: Negative.   Neurological: Negative.   Endo/Heme/Allergies: Negative.   Psychiatric/Behavioral: Depression: Stable. Hallucinations: Denies. Substance abuse: Denies. Suicidal ideas: Denies.   Blood pressure 139/84, pulse 81, temperature 98 F (36.7 C), temperature source Oral, resp. rate 18, SpO2 98 %. There is no height or weight on file to calculate BMI.  Treatment Plan Summary: Plan Psychiatrically clear to follow up with current outpatient psychiatric provider  Patient was restarted on her home medications Depakote and Lithium.  Will monitor patient overnight for stabilization with possible discharge tomorrow.   Disposition: Monitor over night for stabilization; reasess tomorrow if continue to improve with no behavioral outburst and no visable hallucinations; discharge home tomorrow morning.  Shelby Paddock, NP 09/25/2020 1:24 PM

## 2020-09-25 NOTE — Progress Notes (Signed)
TOC CM/CSW attempted to contact Shelby Jordan/pts daughter [phone].  CSW left HIPPA compliant message with my contact information.  Pt currently does not have her key due to her granddaughter losing hers.  So, pt gave granddaughter hers.  Sheryn Aldaz Tarpley-Carter, MSW, LCSW-A Pronouns:  She, Her, [place] [facility] Transitions of CareClinical Social Worker [email or web address] [phone]
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
## 2020-09-25 NOTE — BH Assessment (Addendum)
[facility] Assessment Progress Note  Pt has been seen by Earleen Newport, NP, who has also spoken to pt's daughter, [name] ([phone]), who petitioned for pt to be involuntarily committed.  Please note that IVC was subsequently upheld by EDP Sherwood Gambler, MD.  Delphia Grates ascertained from the daughter that pt sees a provider at [facility] by the name of Eloise Levels, and that pt missed an appointment yesterday.  At [facility] request this writer called [facility] at 14:02 to obtain additional information and to discuss today's findings.  Call rolled to voicemail and I left a message requesting return call, which is pending as of this writing.  Final disposition is currently pending.  Jalene Mullet, [place] [facility] Coordinator [phone]   Addendum:  After further review, Delphia Grates has determined that pt is to remain at [facility] overnight for further observation and stabilization.  EDP Blanchie Dessert, MD and pt's nurse, Dawn, have been notified.  Jalene Mullet, [place] Coordinator [phone]

## 2020-09-25 NOTE — Progress Notes (Signed)
TOC CM/CSW spoke with pts daughter/Lashawn Thompkins [phone].  Sherlynn Carbon stated pt cld come back to her home, but has concerns about pts hallucinations.  Sherlynn Carbon stated she would insure that pt makes it to her next appointment with [facility].  CSW also advised Lashawn to find out if pt transferred Medicaid from [place] to [place].  CSW will continue to follow for dc needs.  Byren Pankow Tarpley-Carter, MSW, LCSW-A Pronouns:  She, Her, Hers                  [place] ED Transitions of CareClinical Social Worker [email or web address] [phone]

## 2020-09-25 NOTE — Progress Notes (Signed)
TOC CM/CSW confirmed with Jalene Mullet, Counselor, pt will be under observation and stabilization.  Gurdeep Keesey Tarpley-Carter, MSW, LCSW-A Pronouns:  She, Her, [place] ED Transitions of CareClinical Social Worker [email or web address] [phone]

## 2020-09-25 NOTE — Progress Notes (Signed)
..   Transition of Care [facility]) - Emergency Department Mini Assessment   Patient Details  Name: Shelby Jordan MRN: [id number] Date of Birth: [birth year]  Transition of Care [facility]) CM/SW Contact:    Gaetano Hawthorne Tarpley-Carter, [place] Phone Number: 09/25/2020, 3:25 PM   Clinical Narrative: [facility] CM/CSW consulted with pt.  Pt has stated she is ready to return to daughters house where she currently lives.  Biannca Scantlin Tarpley-Carter, MSW, LCSW-A Pronouns:  She, Her, Hers                  [place] ED Transitions of CareClinical Social Worker [email or web address] [phone]   ED Mini Assessment: What brought you to the Emergency Department? : Medical Clearance  Barriers to Discharge: No Barriers Identified     Means of departure: Not know  Interventions which prevented an admission or readmission: Transportation Screening    Patient Contact and Communications Key Contact 1: [name]     Contact Date: 09/25/20,     Contact Phone Number: [phone] Call outcome: CSW attempted to contact [place].  Patient states their goals for this hospitalization and ongoing recovery are:: Pt wants to return home.   Choice offered to / list presented to : NA  Admission diagnosis:  Manic Episode Patient Active Problem List   Diagnosis Date Noted  . Bipolar disorder (manic depression) ([place]) 12/15/2019  . Bipolar I disorder, most recent episode (or current) manic ([place]) 12/15/2019  . Essential hypertension 12/15/2019   PCP:  System, Provider Not In Pharmacy:   [id number] [place], [address] [place] [address] Phone: [phone] Fax: [phone]

## 2020-09-25 NOTE — Progress Notes (Signed)
TOC CM/CSW explained to pt Chief Technology Officer, pt signed Chief Technology Officer, and CSW has faxed waiver to [employer].  CSW called Production assistant, radio.  CSW will continue to follow for dc needs.Ricquita Tarpley-Carter, MSW, LCSW-A Pronouns:  She, Her, Hers                  [place] ED Transitions of CareClinical Social Worker [email or web address] [phone]

## 2020-09-26 DIAGNOSIS — F22 Delusional disorders: Secondary | ICD-10-CM | POA: Diagnosis not present

## 2020-09-26 NOTE — TOC Transition Note (Signed)
Transition of Care [facility]) - CM/SW Discharge Note   Patient Details  Name: Shelby Jordan MRN: [id number] Date of Birth: [birth year]  Transition [facility]) CM/SW Contact:  Erenest Rasher, RN Phone Number: [phone] 09/26/2020, 4:05 PM   Clinical Narrative:      TOC CM contacted [facility] transportation to her home, Verified address with dtr. Dtr states family is at home to receive pt. Scanned waiver to transportation.    Barriers to Discharge: No Barriers Identified   Patient Goals and CMS Choice Patient states their goals for this hospitalization and ongoing recovery are:: Pt wants to return home.   Choice offered to / list presented to : NA  Discharge Placement                       Discharge Plan and Services                                     Social Determinants of Health (SDOH) Interventions     Readmission Risk Interventions No flowsheet data found.

## 2020-09-26 NOTE — Consult Note (Signed)
Telepsych Consultation   Reason for Consult:  Psychiatry Reassessment Referring Physician:  Dr Ron Parker Location of Patient: [facility] Location of Provider: [facility]  Patient Identification: Shelby Jordan MRN:  [id number] Principal Diagnosis: Bipolar I disorder, most recent episode (or current) manic ([employer]) Diagnosis:  Principal Problem:   Bipolar I disorder, most recent episode (or current) manic ([place])   Total Time spent with patient: 30 minutes  Subjective:   Shelby Jordan is a 70 y.o. female patient. Patient reports readiness to discharge home.  Patient states "my daughter can be very aggressive and abrasive, she talks to people any way she wants to."  HPI:   Patient assessed by nurse practitioner.  Patient alert and oriented, answers appropriately.  Patient pleasant and cooperative during assessment.  Patient reports she has conflict with her daughter at times.  Patient reports she becomes frustrated when she asks her daughter to take her someplace, for instance the pharmacy, and the daughter refuses.  Patient reports she is unable to see her psychiatrist using her cell phone so she is forced to use her daughter's cell phone to complete virtual psychiatry appointments.  Patient reports she does not prefer to speak with the provider in front of her daughter but feels that she has no choice.  Patient reports a plan to purchase a new cell phone as soon as possible.  Patient reports she also argues with her daughter about clothing and the outfits patient chooses.    Patient reports she has a history of bipolar disorder.  Patient is followed outpatient by [facility].  Patient reports compliance with current home medications including lithium and Depakote.  Patient reports she does have some difficulty following up in person to complete report or studies related to transportation.  Patient reports her granddaughter, [place], assist with  medication management as well as feeling a pillbox.  Patient reports she would prefer her daughter not to assist with medication management.  Patient resides with her daughter.  Patient reports she has lived with her daughter "off and on for about 25 years."  Patient denies access to weapons.  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  Patient denies alcohol and substance use.  Patient reports average sleep and appetite.  Patient denies suicidal and homicidal ideations.  Patient denies history of suicide attempts, denies history of self-harm behaviors.  Patient denies both auditory and visual hallucinations.  There is no evidence of delusional thought content and no indication that patient is responding to internal stimuli.  Patient denies symptoms of paranoia.  Patient gives verbal consent to speak with daughter/Lashawn Thompkins [phone].  Attempted to call daughter x2, HIPAA compliant voicemail left.   Past Psychiatric History: Bipolar 1 Disorder  Risk to Self:   Denies Risk to Others:   Denies Prior Inpatient Therapy:  Admitted to [facility] Prior Outpatient Therapy:   Currently followed by [facility]   Past Medical History:  Past Medical History:  Diagnosis Date  . Bipolar 1 disorder ([place])   . Hepatitis C   . Hypertension     Past Surgical History:  Procedure Laterality Date  . APPENDECTOMY    . TOE AMPUTATION     Family History: No family history on file. Family Psychiatric  History: None reported Social History:  Social History   Substance and Sexual Activity  Alcohol Use Never     Social History   Substance and Sexual Activity  Drug Use Never    Social  History   Socioeconomic History  . Marital status: Legally Separated    Spouse name: Not on file  . Number of children: Not on file  . Years of education: Not on file  . Highest education level: Not on file  Occupational History  . Not on file  Tobacco Use  . Smoking status: Never  Smoker  . Smokeless tobacco: Never Used  Substance and Sexual Activity  . Alcohol use: Never  . Drug use: Never  . Sexual activity: Not on file  Other Topics Concern  . Not on file  Social History Narrative  . Not on file   Social Determinants of Health   Financial Resource Strain: Not on file  Food Insecurity: Not on file  Transportation Needs: Not on file  Physical Activity: Not on file  Stress: Not on file  Social Connections: Not on file   Additional Social History:    Allergies:   Allergies  Allergen Reactions  . Tomato (Diagnostic) Other (See Comments)    Tongue break out.     Labs:  Results for orders placed or performed during the hospital encounter of 09/24/20 (from the past 48 hour(s))  Urine rapid drug screen (hosp performed)     Status: Abnormal   Collection Time: 09/24/20  6:09 PM  Result Value Ref Range   Opiates NONE DETECTED NONE DETECTED   Cocaine NONE DETECTED NONE DETECTED   Benzodiazepines POSITIVE (A) NONE DETECTED   Amphetamines NONE DETECTED NONE DETECTED   Tetrahydrocannabinol NONE DETECTED NONE DETECTED   Barbiturates NONE DETECTED NONE DETECTED    Comment: (NOTE) DRUG SCREEN FOR MEDICAL PURPOSES ONLY.  IF CONFIRMATION IS NEEDED FOR ANY PURPOSE, NOTIFY LAB WITHIN 5 DAYS.  LOWEST DETECTABLE LIMITS FOR URINE DRUG SCREEN Drug Class                     Cutoff (ng/mL) Amphetamine and metabolites    1000 Barbiturate and metabolites    200 Benzodiazepine                 338 Tricyclics and metabolites     300 Opiates and metabolites        300 Cocaine and metabolites        300 THC                            50 Performed at [facility], [place] [address], [place], [place] [address]   Comprehensive metabolic panel     Status: Abnormal   Collection Time: 09/24/20  6:43 PM  Result Value Ref Range   Sodium 137 135 - 145 mmol/L   Potassium 4.2 3.5 - 5.1 mmol/L   Chloride 105 98 - 111 mmol/L   CO2 23 22 - 32 mmol/L    Glucose, Bld 110 (H) 70 - 99 mg/dL    Comment: Glucose reference range applies only to samples taken after fasting for at least 8 hours.   BUN 15 8 - 23 mg/dL   Creatinine, Ser 1.09 (H) 0.44 - 1.00 mg/dL   Calcium 9.4 8.9 - 10.3 mg/dL   Total Protein 7.6 6.5 - 8.1 g/dL   Albumin 4.4 3.5 - 5.0 g/dL   AST 20 15 - 41 U/L   ALT 15 0 - 44 U/L   Alkaline Phosphatase 41 38 - 126 U/L   Total Bilirubin 0.6 0.3 - 1.2 mg/dL   GFR, Estimated 55 (L) >60 mL/min  Comment: (NOTE) Calculated using the CKD-EPI Creatinine Equation (2021)    Anion gap 9 5 - 15    Comment: Performed at [facility], [place] [address] [place], [place] [address]  Acetaminophen level     Status: Abnormal   Collection Time: 09/24/20  6:43 PM  Result Value Ref Range   Acetaminophen (Tylenol), Serum <10 (L) 10 - 30 ug/mL    Comment: (NOTE) Therapeutic concentrations vary significantly. A range of 10-30 ug/mL  may be an effective concentration for many patients. However, some  are best treated at concentrations outside of this range. Acetaminophen concentrations >150 ug/mL at 4 hours after ingestion  and >50 ug/mL at 12 hours after ingestion are often associated with  toxic reactions.  Performed at [facility], [place] [address], [place], [place] [address]   Salicylate level     Status: Abnormal   Collection Time: 09/24/20  6:43 PM  Result Value Ref Range   Salicylate Lvl <8.6 (L) 7.0 - 30.0 mg/dL    Comment: Performed at [facility], [place] [address] [place], [place] [address]  Troponin I (High Sensitivity)     Status: None   Collection Time: 09/24/20  6:43 PM  Result Value Ref Range   Troponin I (High Sensitivity) 8 <18 ng/L    Comment: (NOTE) Elevated high sensitivity troponin I (hsTnI) values and significant  changes across serial measurements may suggest ACS but many other  chronic and acute conditions are known to elevate hsTnI results.  Refer to the  "Links" section for chest pain algorithms and additional  guidance. Performed at [facility], [place] [address], [place], [place] [address]   CBC with Differential     Status: None   Collection Time: 09/24/20  6:43 PM  Result Value Ref Range   WBC 9.3 4.0 - 10.5 K/uL   RBC 4.19 3.87 - 5.11 MIL/uL   Hemoglobin 12.2 12.0 - 15.0 g/dL   HCT 37.4 36.0 - 46.0 %   MCV 89.3 80.0 - 100.0 fL   MCH 29.1 26.0 - 34.0 pg   MCHC 32.6 30.0 - 36.0 g/dL   RDW 15.4 11.5 - 15.5 %   Platelets 265 150 - 400 K/uL   nRBC 0.0 0.0 - 0.2 %   Neutrophils Relative % 74 %   Neutro Abs 6.9 1.7 - 7.7 K/uL   Lymphocytes Relative 17 %   Lymphs Abs 1.6 0.7 - 4.0 K/uL   Monocytes Relative 7 %   Monocytes Absolute 0.6 0.1 - 1.0 K/uL   Eosinophils Relative 1 %   Eosinophils Absolute 0.1 0.0 - 0.5 K/uL   Basophils Relative 0 %   Basophils Absolute 0.0 0.0 - 0.1 K/uL   Immature Granulocytes 1 %   Abs Immature Granulocytes 0.05 0.00 - 0.07 K/uL    Comment: Performed at [facility], [place] [address], [place],  [address]  Ethanol     Status: None   Collection Time: 09/24/20  6:43 PM  Result Value Ref Range   Alcohol, Ethyl (B) <10 <10 mg/dL    Comment: (NOTE) Lowest detectable limit for serum alcohol is 10 mg/dL.  For medical purposes only. Performed at [facility], [place] [address] [place], [place] [address]   Valproic acid level     Status: None   Collection Time: 09/24/20  6:43 PM  Result Value Ref Range   Valproic Acid Lvl 73 50.0 - 100.0 ug/mL  Comment: Performed at [facility], [place] [address], [place], [place] [address]  Resp Panel by RT-PCR (Flu A&B, Covid) Nasopharyngeal Swab     Status: None   Collection Time: 09/24/20  8:18 PM   Specimen: Nasopharyngeal Swab; Nasopharyngeal(NP) swabs in vial transport medium  Result Value Ref Range   SARS Coronavirus 2 by RT PCR NEGATIVE NEGATIVE    Comment: (NOTE) SARS-CoV-2  target nucleic acids are NOT DETECTED.  The SARS-CoV-2 RNA is generally detectable in upper respiratory specimens during the acute phase of infection. The lowest concentration of SARS-CoV-2 viral copies this assay can detect is 138 copies/mL. A negative result does not preclude SARS-Cov-2 infection and should not be used as the sole basis for treatment or other patient management decisions. A negative result may occur with  improper specimen collection/handling, submission of specimen other than nasopharyngeal swab, presence of viral mutation(s) within the areas targeted by this assay, and inadequate number of viral copies(<138 copies/mL). A negative result must be combined with clinical observations, patient history, and epidemiological information. The expected result is Negative.  Fact Sheet for Patients:  EntrepreneurPulse.com.au  Fact Sheet for Healthcare Providers:  IncredibleEmployment.be  This test is no t yet approved or cleared by the [place] FDA and  has been authorized for detection and/or diagnosis of SARS-CoV-2 by FDA under an Emergency Use Authorization (EUA). This EUA will remain  in effect (meaning this test can be used) for the duration of the COVID-19 declaration under Section 564(b)(1) of the Act, 21 U.S.C.section 360bbb-3(b)(1), unless the authorization is terminated  or revoked sooner.       Influenza A by PCR NEGATIVE NEGATIVE   Influenza B by PCR NEGATIVE NEGATIVE    Comment: (NOTE) The Xpert Xpress SARS-CoV-2/FLU/RSV plus assay is intended as an aid in the diagnosis of influenza from Nasopharyngeal swab specimens and should not be used as a sole basis for treatment. Nasal washings and aspirates are unacceptable for Xpert Xpress SARS-CoV-2/FLU/RSV testing.  Fact Sheet for Patients: EntrepreneurPulse.com.au  Fact Sheet for Healthcare Providers: IncredibleEmployment.be  This  test is not yet approved or cleared by the [place] FDA and has been authorized for detection and/or diagnosis of SARS-CoV-2 by FDA under an Emergency Use Authorization (EUA). This EUA will remain in effect (meaning this test can be used) for the duration of the COVID-19 declaration under Section 564(b)(1) of the Act, 21 U.S.C. section 360bbb-3(b)(1), unless the authorization is terminated or revoked.  Performed at [facility], [place] [address] [place], [place] [address]   Lithium level     Status: Abnormal   Collection Time: 09/24/20  8:18 PM  Result Value Ref Range   Lithium Lvl 0.24 (L) 0.60 - 1.20 mmol/L    Comment: Performed at [facility], [place] [address] [place], [place] [address]  Troponin I (High Sensitivity)     Status: None   Collection Time: 09/24/20  8:18 PM  Result Value Ref Range   Troponin I (High Sensitivity) 9 <18 ng/L    Comment: (NOTE) Elevated high sensitivity troponin I (hsTnI) values and significant  changes across serial measurements may suggest ACS but many other  chronic and acute conditions are known to elevate hsTnI results.  Refer to the "Links" section for chest pain algorithms and additional  guidance. Performed at [facility], [place] [address], [place],  [address]     Medications:  Current Facility-Administered Medications  Medication Dose Route Frequency Provider Last Rate Last  Admin  . amLODipine (NORVASC) tablet 10 mg  10 mg Oral Daily Sherwood Gambler, MD   10 mg at 09/25/20 0859  . atorvastatin (LIPITOR) tablet 20 mg  20 mg Oral Daily Sherwood Gambler, MD   20 mg at 09/25/20 0859  . diphenhydrAMINE (BENADRYL) capsule 25 mg  25 mg Oral QHS PRN Sherwood Gambler, MD   25 mg at 09/25/20 2200  . divalproex (DEPAKOTE) DR tablet 500 mg  500 mg Oral TID Sherwood Gambler, MD   500 mg at 09/25/20 2139  . lisinopril (ZESTRIL) tablet 40 mg  40 mg Oral Daily Sherwood Gambler, MD   40 mg at  09/25/20 0859  . lithium carbonate (LITHOBID) CR tablet 300 mg  300 mg Oral QHS Sherwood Gambler, MD   300 mg at 09/25/20 2139  . metoprolol succinate (TOPROL-XL) 24 hr tablet 25 mg  25 mg Oral Daily Sherwood Gambler, MD   25 mg at 09/25/20 0858  . temazepam (RESTORIL) capsule 30 mg  30 mg Oral QHS PRN Sherwood Gambler, MD   30 mg at 09/25/20 2139   Current Outpatient Medications  Medication Sig Dispense Refill  . amLODipine (NORVASC) 10 MG tablet Take 1 tablet (10 mg total) by mouth daily. 30 tablet 1  . atorvastatin (LIPITOR) 20 MG tablet Take 20 mg by mouth daily.    . diphenhydrAMINE (BENADRYL) 25 mg capsule Take 1 capsule (25 mg total) by mouth at bedtime as needed for sleep. 30 capsule 1  . divalproex (DEPAKOTE) 500 MG DR tablet Take 1 tablet (500 mg total) by mouth 3 (three) times daily. Take 1 in the morning, and 2 at night (Patient taking differently: Take 500 mg by mouth 2 (two) times daily.) 60 tablet 1  . Ibuprofen-diphenhydrAMINE Cit (ADVIL PM) 200-38 MG TABS Take 2 tablets by mouth daily as needed (pain).    Marland Kitchen lisinopril (ZESTRIL) 40 MG tablet Take 1 tablet (40 mg total) by mouth daily. 30 tablet 1  . lithium carbonate (LITHOBID) 300 MG CR tablet Take 1 tablet (300 mg total) by mouth at bedtime. 30 tablet 1  . metoprolol succinate (TOPROL-XL) 25 MG 24 hr tablet Take 1 tablet (25 mg total) by mouth daily. (Patient not taking: No sig reported) 30 tablet 1  . temazepam (RESTORIL) 30 MG capsule Take 1 capsule (30 mg total) by mouth at bedtime as needed for sleep. (Patient not taking: No sig reported) 30 capsule 1    Musculoskeletal: Strength & Muscle Tone: within normal limits Gait & Station: normal Patient leans: N/A  Psychiatric Specialty Exam: Physical Exam Vitals and nursing note reviewed.  Constitutional:      Appearance: She is well-developed.  HENT:     Head: Normocephalic.  Cardiovascular:     Rate and Rhythm: Normal rate.  Pulmonary:     Effort: Pulmonary effort is  normal.  Neurological:     Mental Status: She is alert and oriented to person, place, and time.  Psychiatric:        Attention and Perception: Attention and perception normal.        Mood and Affect: Mood and affect normal.        Speech: Speech normal.        Behavior: Behavior normal. Behavior is cooperative.        Thought Content: Thought content normal.        Cognition and Memory: Cognition and memory normal.        Judgment: Judgment normal.     Review  of Systems  Constitutional: Negative.   HENT: Negative.   Eyes: Negative.   Respiratory: Negative.   Cardiovascular: Negative.   Gastrointestinal: Negative.   Genitourinary: Negative.   Musculoskeletal: Negative.   Skin: Negative.   Neurological: Negative.   Psychiatric/Behavioral: Negative.     Blood pressure (!) 156/93, pulse 76, temperature (!) 97.4 F (36.3 C), temperature source Oral, resp. rate 16, SpO2 98 %.There is no height or weight on file to calculate BMI.  General Appearance: Casual and Fairly Groomed  Eye Contact:  Good  Speech:  Clear and Coherent  Volume:  Normal  Mood:  Euthymic  Affect:  Congruent  Thought Process:  Coherent, Goal Directed and Descriptions of Associations: Intact  Orientation:  Full (Time, Place, and Person)  Thought Content:  Logical  Suicidal Thoughts:  No  Homicidal Thoughts:  No  Memory:  Immediate;   Good Recent;   Good Remote;   Good  Judgement:  Good  Insight:  Fair  Psychomotor Activity:  Normal  Concentration:  Concentration: Good and Attention Span: Good  Recall:  Good  Fund of Knowledge:  Good  Language:  Good  Akathisia:  No  Handed:  Right  AIMS (if indicated):     Assets:  Communication Skills Desire for Improvement Financial Resources/Insurance Housing Intimacy Leisure Time Physical Health Resilience Social Support Talents/Skills  ADL's:  Intact  Cognition:  WNL  Sleep:        Treatment Plan Summary: Plan patient reviewed with Dr Serafina Mitchell.   Patient cleared by psychiatry. Follow up with established outpatient psychiatry, [facility].  Medications: -Diphenhydramine 25 mg nightly as needed/sleep  -Depakote DR 500 mg 3 times daily -Lithium carbonate CR 300 mg nightly -Temazepam 30 mg nightly as needed/sleep -Amlodipine 10 mg daily -Atorvastatin 20 mg daily -Lisinopril 40 mg daily -Metoprolol XL 25 mg daily  Disposition: No evidence of imminent risk to self or others at present.   Patient does not meet criteria for psychiatric inpatient admission. Supportive therapy provided about ongoing stressors. Discussed crisis plan, support from social network, calling 911, coming to the Emergency Department, and calling Suicide Hotline.  This service was provided via telemedicine using a 2-way, interactive audio and video technology.  Names of all persons participating in this telemedicine service and their role in this encounter. Name: Shelby Jordan Role: Patient  Name: Letitia Libra Role: FNP  Name: Dr Serafina Mitchell Role: Psychiatry    Emmaline Kluver, [place] 09/26/2020 8:51 AM

## 2020-09-26 NOTE — ED Notes (Signed)
Pt DC off unit to home per provider.pt alert, calm, cooperative, no s/s of distress.  DC information given to and reviewed with pt, pt acknowledged understanding. Pt ambulatory off unit, escorted by NT. Pt transported by SW setup [facility] transport.

## 2020-09-26 NOTE — ED Provider Notes (Signed)
Patient is medically cleared and has been seen by psychiatry and is clear for outpatient therapy.  We spoke to family they agree to take patient home.  She is safe for discharge.  Patient agrees to plan.  Return precautions discussed.   Breck Coons, MD 09/26/20 [phone]

## 2020-09-26 NOTE — ED Notes (Signed)
Patient received lunch tray 

## 2020-09-26 NOTE — ED Notes (Signed)
Patient showered Clean scrubs

## 2020-09-26 NOTE — BH Assessment (Signed)
[facility] Assessment Progress Note  Per Letitia Libra, NP, this pt does not require psychiatric hospitalization at this time.  Pt presents under IVC initiated by pt's daughter (erroneously identified as pt's mother on Affidavit and Petition for Involuntary Commitment) and upheld by EDP Sherwood Gambler, MD, which has been rescinded by EDP Dewaine Conger, MD.  Pt is psychiatrically cleared.  Discharge instructions advise pt to continue treatment with [facility].   Dr Ron Parker, Ricquita Tarpley-Carter, CSW and pt's nurse, Eustaquio Maize, have been notified.  Please note that as of this writing this writer has not received a response to my call to [facility] documented in my note dated 09/25/2020.  Jalene Mullet, [place] Triage Specialist [phone]

## 2020-09-26 NOTE — Discharge Instructions (Signed)
For your behavioral health needs you are advised to continue treatment with [facility]:       Beverly Sessions      [address] [place]      [place], [place] [address]      [phone]

## 2020-09-26 NOTE — Progress Notes (Signed)
TOC CM/CSW contacted Sherlynn Carbon Thompkins/pts daughter [phone].Sherlynn Carbon stated pt could return home.   CSW will continue to follow for dc needs.  Ricquita Tarpley-Carter, MSW, LCSW-A Pronouns: She, Her, [facility] [place] [facility] ED Transitions of CareClinical Social Worker [email or web address] [phone]

## 2020-09-26 NOTE — Progress Notes (Signed)
TOC CM/CSW attempted to contact Lashawn Thompkins/pts daughter [phone].  CSW left HIPPA compliant message with my contact information.   CSW will continue to follow for dc needs.  Romyn Boswell Tarpley-Carter, MSW, LCSW-A Pronouns:  She, Her, [place] ED Transitions of CareClinical Social Worker [email or web address] [phone]

## 2020-09-30 NOTE — Progress Notes (Addendum)
09/30/2020 @ 1:00pm  TOC CM/CSW received a call from [name] [phone].  Shelby Jordan has concerns about pt.  Shelby Jordan stated, "I want to speak to administration and not a counselor or NP in psych, but an administratot."  "I just came from [place] my mother again, and want some answers."  "Magistrate office told me they/psych was suppose to hold my mother for 72 hours after the first IVC."  "Why didn't they keep her for 72 hours."    CSW referred Shelby Jordan to Patient Experience [phone].  Shelby Jordan, MSW, LCSW-A Pronouns:  She, Her, [place] ED Transitions of CareClinical Social Worker [email or web address] [phone]

## 2020-10-13 ENCOUNTER — Other Ambulatory Visit: Payer: Self-pay | Admitting: Psychiatry

## 2021-04-23 ENCOUNTER — Other Ambulatory Visit: Payer: Self-pay

## 2021-04-23 ENCOUNTER — Emergency Department (HOSPITAL_COMMUNITY)
Admission: EM | Admit: 2021-04-23 | Discharge: 2021-04-30 | Disposition: A | Discharge status: Other Acute Inpatient Hospital | Payer: Medicare Other | Attending: Emergency Medicine | Admitting: Emergency Medicine

## 2021-04-23 ENCOUNTER — Ambulatory Visit (INDEPENDENT_AMBULATORY_CARE_PROVIDER_SITE_OTHER)
Admission: EM | Admit: 2021-04-23 | Discharge: 2021-04-23 | Disposition: A | Discharge status: Other Acute Inpatient Hospital | Payer: Medicare Other | Source: Home / Self Care

## 2021-04-23 ENCOUNTER — Encounter (HOSPITAL_COMMUNITY): Payer: Self-pay

## 2021-04-23 DIAGNOSIS — F3113 Bipolar disorder, current episode manic without psychotic features, severe: Secondary | ICD-10-CM | POA: Insufficient documentation

## 2021-04-23 DIAGNOSIS — Z79899 Other long term (current) drug therapy: Secondary | ICD-10-CM | POA: Insufficient documentation

## 2021-04-23 DIAGNOSIS — Z20822 Contact with and (suspected) exposure to covid-19: Secondary | ICD-10-CM | POA: Diagnosis not present

## 2021-04-23 DIAGNOSIS — Z9151 Personal history of suicidal behavior: Secondary | ICD-10-CM | POA: Insufficient documentation

## 2021-04-23 DIAGNOSIS — R4587 Impulsiveness: Secondary | ICD-10-CM | POA: Insufficient documentation

## 2021-04-23 DIAGNOSIS — F17210 Nicotine dependence, cigarettes, uncomplicated: Secondary | ICD-10-CM | POA: Insufficient documentation

## 2021-04-23 DIAGNOSIS — F3110 Bipolar disorder, current episode manic without psychotic features, unspecified: Secondary | ICD-10-CM | POA: Diagnosis not present

## 2021-04-23 DIAGNOSIS — I10 Essential (primary) hypertension: Secondary | ICD-10-CM | POA: Insufficient documentation

## 2021-04-23 NOTE — Discharge Instructions (Signed)
Patient to be transferred to [facility] ED for medical clearance for inpatient psychiatric treatment °

## 2021-04-23 NOTE — ED Triage Notes (Signed)
Patient arrives via [facility]. Pt IVC by mobile crisis center. Pt has not been taking her meds, erratic behavior. Hx of schizophrenia and bipolar

## 2021-04-23 NOTE — ED Notes (Addendum)
Pt has been changed out into burgundy scrubs, pt belonging bag has been placed  in the cabinet behind nurses desk. Pt has two rings on her hands and she stated that she can not take them off/ they wont come off.  She refused to take her spandex off.
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
## 2021-04-23 NOTE — ED Notes (Signed)
Pt. Refused to remove bonnett, RN, Felicia made aware.

## 2021-04-23 NOTE — ED Notes (Signed)
Report called to Charge Nurse Jenel Lucks Nash/WLED.  [employer] [employer] requested.  Pt is IVC.

## 2021-04-24 DIAGNOSIS — F3113 Bipolar disorder, current episode manic without psychotic features, severe: Secondary | ICD-10-CM | POA: Diagnosis not present

## 2021-04-24 LAB — URINALYSIS, ROUTINE W REFLEX MICROSCOPIC
Bilirubin Urine: NEGATIVE
Glucose, UA: NEGATIVE mg/dL
Ketones, ur: NEGATIVE mg/dL
Nitrite: NEGATIVE
Protein, ur: >=300 mg/dL — AB
RBC / HPF: >50 RBC/hpf — ABNORMAL HIGH (ref 0–5)
Specific Gravity, Urine: 1.012 (ref 1.005–1.030)
WBC, UA: >50 WBC/hpf — ABNORMAL HIGH (ref 0–5)
pH: 6 (ref 5.0–8.0)

## 2021-04-24 LAB — COMPREHENSIVE METABOLIC PANEL
ALT: 14 U/L (ref 0–44)
AST: 20 U/L (ref 15–41)
Albumin: 5 g/dL (ref 3.5–5.0)
Alkaline Phosphatase: 42 U/L (ref 38–126)
Anion gap: 9 (ref 5–15)
BUN: 29 mg/dL — ABNORMAL HIGH (ref 8–23)
CO2: 24 mmol/L (ref 22–32)
Calcium: 10.7 mg/dL — ABNORMAL HIGH (ref 8.9–10.3)
Chloride: 111 mmol/L (ref 98–111)
Creatinine, Ser: 1.42 mg/dL — ABNORMAL HIGH (ref 0.44–1.00)
GFR, Estimated: 40 mL/min — ABNORMAL LOW (ref >60)
Glucose, Bld: 168 mg/dL — ABNORMAL HIGH (ref 70–99)
Potassium: 4.3 mmol/L (ref 3.5–5.1)
Sodium: 144 mmol/L (ref 135–145)
Total Bilirubin: 0.6 mg/dL (ref 0.3–1.2)
Total Protein: 8.6 g/dL — ABNORMAL HIGH (ref 6.5–8.1)

## 2021-04-24 LAB — CBC WITH DIFFERENTIAL/PLATELET
Abs Immature Granulocytes: 0.03 10*3/uL (ref 0.00–0.07)
Basophils Absolute: 0 10*3/uL (ref 0.0–0.1)
Basophils Relative: 0 %
Eosinophils Absolute: 0.1 10*3/uL (ref 0.0–0.5)
Eosinophils Relative: 1 %
HCT: 43.2 % (ref 36.0–46.0)
Hemoglobin: 14.1 g/dL (ref 12.0–15.0)
Immature Granulocytes: 0 %
Lymphocytes Relative: 23 %
Lymphs Abs: 2.2 10*3/uL (ref 0.7–4.0)
MCH: 29 pg (ref 26.0–34.0)
MCHC: 32.6 g/dL (ref 30.0–36.0)
MCV: 88.9 fL (ref 80.0–100.0)
Monocytes Absolute: 0.8 10*3/uL (ref 0.1–1.0)
Monocytes Relative: 8 %
Neutro Abs: 6.3 10*3/uL (ref 1.7–7.7)
Neutrophils Relative %: 68 %
Platelets: 318 10*3/uL (ref 150–400)
RBC: 4.86 MIL/uL (ref 3.87–5.11)
RDW: 14.6 % (ref 11.5–15.5)
WBC: 9.3 10*3/uL (ref 4.0–10.5)
nRBC: 0 % (ref 0.0–0.2)

## 2021-04-24 LAB — RESP PANEL BY RT-PCR (FLU A&B, COVID) ARPGX2
Influenza A by PCR: NEGATIVE
Influenza B by PCR: NEGATIVE
SARS Coronavirus 2 by RT PCR: NEGATIVE

## 2021-04-24 LAB — LITHIUM LEVEL: Lithium Lvl: 0.33 mmol/L — ABNORMAL LOW (ref 0.60–1.20)

## 2021-04-24 LAB — RAPID URINE DRUG SCREEN, HOSP PERFORMED
Amphetamines: NOT DETECTED
Barbiturates: NOT DETECTED
Benzodiazepines: NOT DETECTED
Cocaine: NOT DETECTED
Opiates: NOT DETECTED
Tetrahydrocannabinol: NOT DETECTED

## 2021-04-24 LAB — ACETAMINOPHEN LEVEL: Acetaminophen (Tylenol), Serum: <10 ug/mL — ABNORMAL LOW (ref 10–30)

## 2021-04-24 LAB — SALICYLATE LEVEL: Salicylate Lvl: <7 mg/dL — ABNORMAL LOW (ref 7.0–30.0)

## 2021-04-24 LAB — ETHANOL: Alcohol, Ethyl (B): <10 mg/dL (ref <10)

## 2021-04-24 LAB — VALPROIC ACID LEVEL
Valproic Acid Lvl: 119 ug/mL — ABNORMAL HIGH (ref 50.0–100.0)
Valproic Acid Lvl: 82 ug/mL (ref 50.0–100.0)

## 2021-04-24 MED ORDER — DIVALPROEX SODIUM 250 MG PO DR TAB
250.0000 mg | DELAYED_RELEASE_TABLET | Freq: Two times a day (BID) | ORAL | Status: DC
  Administered 2021-04-24 – 2021-04-27 (×6): 250 mg via ORAL
  Filled 2021-04-24 (×6): qty 1

## 2021-04-24 MED ORDER — DIVALPROEX SODIUM 500 MG PO DR TAB: 500.0000 mg | DELAYED_RELEASE_TABLET | Freq: Two times a day (BID) | ORAL | Status: DC

## 2021-04-24 MED ORDER — TRAZODONE HCL 50 MG PO TABS
50.0000 mg | ORAL_TABLET | Freq: Every evening | ORAL | Status: DC | PRN
  Administered 2021-04-24 (×2): 50 mg via ORAL
  Filled 2021-04-24 (×2): qty 1

## 2021-04-24 MED ORDER — LITHIUM CARBONATE ER 300 MG PO TBCR
300.0000 mg | EXTENDED_RELEASE_TABLET | Freq: Every day | ORAL | Status: DC
  Administered 2021-04-24 (×2): 300 mg via ORAL
  Filled 2021-04-24 (×2): qty 1

## 2021-04-24 NOTE — ED Notes (Signed)
Pt to room 27. Pt calm. Cooperative. Pt oriented to unit and room.   Pt talking to ppl not there.
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
## 2021-04-24 NOTE — Progress Notes (Signed)
Update: Patient's lithium level and Depakote level checked in the emergency department.  Patient's Depakote level slightly elevated at 119 ug/mL (reference range 50.0-100.0 ug/mL).  Thus, will hold off on restarting patient's Depakote at this time.  Additional Depakote level ordered to be drawn at 0800 on 04/24/2021 and if this recheck level is within normal limits/subtherapeutic and patient is asymptomatic, patient's Depakote may be restarted by dayshift treatment team.  Additionally, patient's lithium level is subtherapeutic at 0.33 mmol/L (reference range 0.60-1.20 mmol/L).  Will restart patient's home medication of lithium 300 mg p.o. daily at bedtime at this time. Plan discussed with patient's EDP (Margaux Alroy Bailiff, PA-C) via phone.

## 2021-04-24 NOTE — Consult Note (Addendum)
Per chart review previous shift:  Update:  Patient's Depakote level slightly elevated at 119 ug/mL (reference range 50.0-100.0 ug/mL).  Thus, will hold off on restarting patient's Depakote at this time.  Additional Depakote level ordered to be drawn at 0800 on 04/24/2021 and if this recheck level is within normal limits/subtherapeutic and patient is asymptomatic, patient's Depakote may be restarted by dayshift treatment team.   10/6 @ 1056:  Valproic Acid level 82 (improved from 119).    Will restart Depakote 250 mg Q 12 hours    Valproic acid level on 04/25/21  Recommend repeating CMP to monitor GFR

## 2021-04-24 NOTE — ED Provider Notes (Signed)
[place] DEPT Provider Note   CSN: [id number] Arrival date & time: 04/23/21  2248     History Chief Complaint  Patient presents with   IVC     Shelby Jordan is a 70 y.o. female with Pmhx HTN and bipolar disorder who presents to the ED today from [facility] for medical clearance. She is IVC'd at [facility]. Please see [place] note for additional information.   Per IVC paperwork: Respondent is displaying maniac psychotic behavior with impulsive tendencies. SHe is sleeping less than 2 hours a day and not taking medications prescribed for bipolar disorder. Respondent is putting food in the over and forgetting it which results in the kitchen smoking. Mobile cris was contacted due to the respondent's impulsive behavior and manic psychotic behavior for her safety and others in the household.   The history is provided by the patient and medical records.      Past Medical History:  Diagnosis Date   Bipolar 1 disorder ([place])    Hepatitis C    Hypertension     Patient Active Problem List   Diagnosis Date Noted   Bipolar disorder (manic depression) ([place]) 12/15/2019   Bipolar I disorder, most recent episode (or current) manic ([place]) 12/15/2019   Essential hypertension 12/15/2019    Past Surgical History:  Procedure Laterality Date   APPENDECTOMY     TOE AMPUTATION       OB History   No obstetric history on file.     History reviewed. No pertinent family history.  Social History   Tobacco Use   Smoking status: Never   Smokeless tobacco: Never  Substance Use Topics   Alcohol use: Never   Drug use: Never    Home Medications Prior to Admission medications   Medication Sig Start Date End Date Taking? Authorizing Provider  amLODipine (NORVASC) 10 MG tablet Take 1 tablet (10 mg total) by mouth daily. Patient not taking: Reported on 04/23/2021 12/27/19   Clapacs, Madie Reno, MD  atorvastatin (LIPITOR) 20 MG tablet Take 20 mg by mouth daily. Patient not taking:  Reported on 04/23/2021    [provider]  diphenhydrAMINE (BENADRYL) 25 mg capsule Take 1 capsule (25 mg total) by mouth at bedtime as needed for sleep. Patient taking differently: Take 25 mg by mouth at bedtime. 12/27/19   Clapacs, Madie Reno, MD  divalproex (DEPAKOTE) 500 MG DR tablet Take 1 tablet (500 mg total) by mouth 3 (three) times daily. Take 1 in the morning, and 2 at night Patient taking differently: Take 500 mg by mouth 2 (two) times daily. 12/27/19   Clapacs, Madie Reno, MD  Ibuprofen-diphenhydrAMINE Cit (ADVIL PM) 200-38 MG TABS Take 2 tablets by mouth daily as needed (pain). Patient not taking: Reported on 04/23/2021    [provider]  lisinopril (ZESTRIL) 40 MG tablet Take 1 tablet (40 mg total) by mouth daily. Patient not taking: Reported on 04/23/2021 12/27/19   Clapacs, Madie Reno, MD  lithium carbonate (LITHOBID) 300 MG CR tablet Take 1 tablet (300 mg total) by mouth at bedtime. 12/27/19   Clapacs, Madie Reno, MD  metoprolol succinate (TOPROL-XL) 25 MG 24 hr tablet Take 1 tablet (25 mg total) by mouth daily. Patient not taking: No sig reported 12/28/19   Clapacs, Madie Reno, MD  temazepam (RESTORIL) 30 MG capsule Take 1 capsule (30 mg total) by mouth at bedtime as needed for sleep. 12/27/19   Clapacs, Madie Reno, MD    Allergies    Tomato (diagnostic)  Review of Systems   Review of Systems  Unable to perform ROS: Psychiatric disorder   Physical Exam Updated Vital Signs BP (!) 194/92   Pulse 88   Temp 98.3 F (36.8 C)   Resp 16   Ht 5\' 4"  (1.626 m)   SpO2 98%   BMI 25.40 kg/m   Physical Exam Vitals and nursing note reviewed.  Constitutional:      Appearance: She is not ill-appearing.  HENT:     Head: Normocephalic and atraumatic.  Eyes:     Conjunctiva/sclera: Conjunctivae normal.  Cardiovascular:     Rate and Rhythm: Normal rate and regular rhythm.     Pulses: Normal pulses.  Pulmonary:     Effort: Pulmonary effort is normal.     Breath sounds: Normal breath sounds.  No wheezing, rhonchi or rales.  Skin:    General: Skin is warm and dry.     Coloration: Skin is not jaundiced.  Neurological:     Mental Status: She is alert.    ED Results / Procedures / Treatments   Labs (all labs ordered are listed, but only abnormal results are displayed) Labs Reviewed  LITHIUM LEVEL - Abnormal; Notable for the following components:      Result Value   Lithium Lvl 0.33 (*)    All other components within normal limits  VALPROIC ACID LEVEL - Abnormal; Notable for the following components:   Valproic Acid Lvl 119 (*)    All other components within normal limits  COMPREHENSIVE METABOLIC PANEL - Abnormal; Notable for the following components:   Glucose, Bld 168 (*)    BUN 29 (*)    Creatinine, Ser 1.42 (*)    Calcium 10.7 (*)    Total Protein 8.6 (*)    GFR, Estimated 40 (*)    All other components within normal limits  ACETAMINOPHEN LEVEL - Abnormal; Notable for the following components:   Acetaminophen (Tylenol), Serum <10 (*)    All other components within normal limits  SALICYLATE LEVEL - Abnormal; Notable for the following components:   Salicylate Lvl <7.3 (*)    All other components within normal limits  RESP PANEL BY RT-PCR (FLU A&B, COVID) ARPGX2  ETHANOL  RAPID URINE DRUG SCREEN, HOSP PERFORMED  CBC WITH DIFFERENTIAL/PLATELET  VALPROIC ACID LEVEL    EKG None  Radiology No results found.  Procedures Procedures   Medications Ordered in ED Medications  traZODone (DESYREL) tablet 50 mg (50 mg Oral Given 04/24/21 0137)  lithium carbonate (LITHOBID) CR tablet 300 mg (300 mg Oral Given 04/24/21 0138)    ED Course  I have reviewed the triage vital signs and the nursing notes.  Pertinent labs & imaging results that were available during my care of the patient were reviewed by me and considered in my medical decision making (see chart for details).    MDM Rules/Calculators/A&P                           70 year old female here from [facility] for  medical clearance. [place] is recommended inpatient placement and have filled out IVC paperwork. On arrival to the ED vital signs stable and pt appears to be in no acute distress. She is currently calm and cooperative. Will plan for labs to medically clear and then await placement.   CBC without leukocytosis and hgb stable at 14.1 CMP with creatinine 1.42 however appears to be consistent with previous baselines. No other electrolyte abnormalities  EtOH negative COVID and flu negative Lithium level subtherapeutic at 0.33; started back on lithium at this time Depakote level increased at 119; pt's depakote held at this time with plans for repeat level tomorrow AM.   Otherwise pt is medically cleared. Pending placement.   Final Clinical Impression(s) / ED Diagnoses Final diagnoses:  Bipolar affective disorder, current episode manic, current episode severity unspecified [facility])    Rx / DC Orders ED Discharge Orders     None        Eustaquio Maize, PA-C 04/24/21 0207    Drenda Freeze, MD 04/24/21 [phone]

## 2021-04-24 NOTE — BH Assessment (Signed)
[place] Assessment Progress Note   Per [name], PA, this pt requires psychiatric hospitalization, preferably at at facility providing specialty care for geriatric patients, at this time.  Pt presents at [facility] under IVC initiated by Mobile Crisis and upheld by EDP Shirlyn Goltz, MD.  The following facilities have been contacted to seek placement for this pt, with results as noted:  Beds available, information sent, decision pending: [place]: [facility] [place], [place] Coordinator [phone]

## 2021-04-25 DIAGNOSIS — F3113 Bipolar disorder, current episode manic without psychotic features, severe: Secondary | ICD-10-CM | POA: Diagnosis not present

## 2021-04-25 LAB — CBG MONITORING, ED: Glucose-Capillary: 131 mg/dL — ABNORMAL HIGH (ref 70–99)

## 2021-04-25 MED ORDER — LORAZEPAM 1 MG PO TABS
1.0000 mg | ORAL_TABLET | ORAL | Status: AC | PRN
  Administered 2021-04-28: 1 mg via ORAL
  Filled 2021-04-25: qty 1

## 2021-04-25 MED ORDER — OLANZAPINE 5 MG PO TBDP
5.0000 mg | ORAL_TABLET | Freq: Three times a day (TID) | ORAL | Status: DC | PRN
  Administered 2021-04-27: 5 mg via ORAL
  Filled 2021-04-25: qty 1

## 2021-04-25 MED ORDER — TEMAZEPAM 15 MG PO CAPS
15.0000 mg | ORAL_CAPSULE | Freq: Every evening | ORAL | Status: DC | PRN
  Administered 2021-04-26 – 2021-04-29 (×5): 15 mg via ORAL
  Filled 2021-04-25 (×5): qty 1

## 2021-04-25 MED ORDER — ZIPRASIDONE MESYLATE 20 MG IM SOLR
20.0000 mg | Freq: Once | INTRAMUSCULAR | Status: AC
  Administered 2021-04-25: 20 mg via INTRAMUSCULAR
  Filled 2021-04-25: qty 20

## 2021-04-25 MED ORDER — AMLODIPINE BESYLATE 5 MG PO TABS
5.0000 mg | ORAL_TABLET | Freq: Every day | ORAL | Status: DC
  Administered 2021-04-25 – 2021-04-30 (×6): 5 mg via ORAL
  Filled 2021-04-25 (×6): qty 1

## 2021-04-25 MED ORDER — STERILE WATER FOR INJECTION IJ SOLN
INTRAMUSCULAR | Status: AC
  Administered 2021-04-25: 10 mL
  Filled 2021-04-25: qty 10

## 2021-04-25 MED ORDER — OLANZAPINE 10 MG PO TBDP
10.0000 mg | ORAL_TABLET | Freq: Every day | ORAL | Status: DC
  Administered 2021-04-25 – 2021-04-30 (×6): 10 mg via ORAL
  Filled 2021-04-25 (×6): qty 1

## 2021-04-25 NOTE — ED Notes (Signed)
Due to pt behaviors vitals to be obtained when pt wakes

## 2021-04-25 NOTE — ED Notes (Signed)
Pt son at bedside.

## 2021-04-25 NOTE — Consult Note (Signed)
Shelby Jordan is a 70 year old female who presented to [facility] under IVC for mania.   Plan:   -Discontinue:    -Lithium Carbonate CR TAB 300 mg    -BUN 29, Cr 1.42; U/a Protein >= 300    -Trazodone 50 mg prn   -ReStart:    -Temazepam 15 mg PRN: sleep       -decreased dose from 30 mg - 15 mg due to kidney fxn   -Start:    -Olanzapine 10 mg daily: psychotic symptoms, acute mania   -Continue:    -Depakote DR 250 mg q12 hours: bipolar mania    -continue to monitor levels; last level 10/06: 82   -Recommend:    -Continue to seek inpatient hospitalization for further  observation, stabilization, and treatment  -Repeat Urinalysis  -Referral to [facility] for care coordination and extended  services
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
## 2021-04-25 NOTE — ED Notes (Signed)
Due to patient's paranoid delusions and intrusiveness, will forgo vitals until patient awakens.

## 2021-04-25 NOTE — ED Provider Notes (Addendum)
Emergency Medicine Observation Re-evaluation Note  Shelby Jordan is a 70 y.o. female, seen on rounds today.  Pt initially presented to the ED for complaints of IVC  Currently, the patient is resting comfortably.  Physical Exam  BP (!) 172/87 (BP Location: Right Arm)   Pulse 92   Temp 97.6 F (36.4 C) (Oral)   Resp 20   Ht 5\' 4"  (1.626 m)   SpO2 95%   BMI 25.40 kg/m  Physical Exam General: Nontoxic appearance Cardiac: Normal heart rate Lungs: No respiratory Psych: Calm at this time  ED Course / MDM  EKG:   I have reviewed the labs performed to date as well as medications administered while in observation.  Recent changes in the last 24 hours include she has persistently refused vital signs and become agitated at various times.  She is taking her medications currently.  Initial blood sugar high, needed history of diabetes.  We will check a AC/HS CBG x8.  Consider treatment if needed after that.  Plan  Current plan is for continue to monitor.  Patient's lithium level was low on arrival and could be rechecked in a few days if she continues to take her medicines.  Due to the ongoing agitation I will ask for psychiatric consultation for medication management.  Hopefully this will help her persistent mild hypertension to improve. Diya Dewberry is under involuntary commitment.      Daleen Bo, MD 04/25/21 1100    Daleen Bo, MD 04/25/21 1122   EKG Interpretation  Date/Time:  Friday April 25 2021 12:16:23 EDT Ventricular Rate:  86 PR Interval:  158 QRS Duration: 142 QT Interval:  400 QTC Calculation: 478 R Axis:   58 Text Interpretation: Normal sinus rhythm Right bundle branch block Abnormal ECG since last tracing no significant change Confirmed by Daleen Bo [phone]) on 04/25/2021 12:30:13 PM           Daleen Bo, MD 04/25/21 1230

## 2021-04-25 NOTE — ED Notes (Signed)
Pt in room talking to people who are not there.

## 2021-04-25 NOTE — BH Assessment (Signed)
Time Assessment Progress Note   Per Oneida Alar, NP this pt continues to require psychiatry hospitalization at this time, preferably at a facility providing specialty care for geriatric patients.  She remains under IVC.  The following facilities have been contacted to seek placement for this pt, with results as noted:   Beds available, information sent, decision pending: [place]  Declined: [place], [place] Coordinator [phone]

## 2021-04-26 DIAGNOSIS — F3113 Bipolar disorder, current episode manic without psychotic features, severe: Secondary | ICD-10-CM | POA: Diagnosis not present

## 2021-04-26 DIAGNOSIS — F3110 Bipolar disorder, current episode manic without psychotic features, unspecified: Secondary | ICD-10-CM | POA: Diagnosis not present

## 2021-04-26 LAB — URINALYSIS, ROUTINE W REFLEX MICROSCOPIC
Bilirubin Urine: NEGATIVE
Glucose, UA: NEGATIVE mg/dL
Hgb urine dipstick: NEGATIVE
Ketones, ur: NEGATIVE mg/dL
Leukocytes,Ua: NEGATIVE
Nitrite: NEGATIVE
Protein, ur: NEGATIVE mg/dL
Specific Gravity, Urine: 1.009 (ref 1.005–1.030)
pH: 6 (ref 5.0–8.0)

## 2021-04-26 LAB — CBG MONITORING, ED: Glucose-Capillary: 93 mg/dL (ref 70–99)

## 2021-04-26 NOTE — ED Notes (Signed)
Pt  alert x4  up watching t.v. The pt took all of her evening  meds.

## 2021-04-26 NOTE — Progress Notes (Signed)
CSW followed up with Shelby Jordan with [facility] Patient is under review.   Glennie Isle, MSW, [place], LCAS-A Phone: [phone] Disposition/TOC

## 2021-04-26 NOTE — ED Provider Notes (Signed)
Emergency Medicine Observation Re-evaluation Note  Shelby Jordan is a 70 y.o. female, seen on rounds today.  Pt initially presented to the ED for complaints of IVC  Currently, the patient is resting.  Physical Exam  BP (!) 168/94 (BP Location: Left Arm)   Pulse 88   Temp 97.6 F (36.4 C) (Oral)   Resp 19   Ht 5\' 4"  (1.626 m)   SpO2 99%   BMI 25.40 kg/m  Physical Exam General: resting comfortably, NAD Lungs: normal WOB Psych: currently calm and resting  ED Course / MDM  EKG:EKG Interpretation  Date/Time:  Friday April 25 2021 12:16:23 EDT Ventricular Rate:  86 PR Interval:  158 QRS Duration: 142 QT Interval:  400 QTC Calculation: 478 R Axis:   58 Text Interpretation: Normal sinus rhythm Right bundle branch block Abnormal ECG since last tracing no significant change Confirmed by Daleen Bo [phone]) on 04/25/2021 12:30:13 PM  I have reviewed the labs performed to date as well as medications administered while in observation.  Recent changes in the last 24 hours include none.  Plan  Current plan is for inpatient geriatric psych care, pending facility approval. Jaz Laningham is under involuntary commitment.      Lorelle Gibbs, DO 04/26/21 8588

## 2021-04-26 NOTE — Consult Note (Signed)
[facility] Consult   Reason for Consult:  psych evaluation Referring Physician:  [name], PA-C Patient Identification: Shelby Jordan MRN:  [id number] Principal Diagnosis: Bipolar I disorder, most recent episode (or current) manic ([place]) Diagnosis:  Principal Problem:   Bipolar I disorder, most recent episode (or current) manic ([place])   Total Time spent with patient: 20 minutes  Subjective:   Shelby Jordan is a 70 y.o. female patient admitted to [facility] under IVC with mania.  On assessment patient presents laying in bed. She is alert and oriented to person, place. Calm and cooperative. Tangential. Engaged. States she currently lives in [place] with her daughter; moved to area from [place]. Unable to verify last time seeing mental health or medical doctors. States she has no current local outpatient providers or prescribers. Provider discussed medications changes made and reasons for them; patient verbalized an understanding then became tangentially began speaking about her Lithium. She denies any suicidal or homicidal ideations, auditory or visual hallucinations; she remains disorganized and tangential yet redirectable.    HPI:  Shelby Jordan is a 70 year old patient with a past history of schizophrenia and bipolar I who presented to [facility] via IVC  due to mania. Patient currently living in [place] with her daughter who reports patient having a 30+ year psychiatric history with over 30 hospitalizations.   Past Psychiatric History: schizophrenia, bipolar I  Risk to Self:   Risk to Others:   Prior Inpatient Therapy:   Prior Outpatient Therapy:    Past Medical History:  Past Medical History:  Diagnosis Date   Bipolar 1 disorder ([place])    Hepatitis C    Hypertension     Past Surgical History:  Procedure Laterality Date   APPENDECTOMY     TOE AMPUTATION     Family History: History reviewed. No pertinent family history. Family Psychiatric  History: not  noted Social History:  Social History   Substance and Sexual Activity  Alcohol Use Never     Social History   Substance and Sexual Activity  Drug Use Never    Social History   Socioeconomic History   Marital status: Legally Separated    Spouse name: Not on file   Number of children: Not on file   Years of education: Not on file   Highest education level: Not on file  Occupational History   Not on file  Tobacco Use   Smoking status: Never   Smokeless tobacco: Never  Substance and Sexual Activity   Alcohol use: Never   Drug use: Never   Sexual activity: Not on file  Other Topics Concern   Not on file  Social History Narrative   Not on file   Social Determinants of Health   Financial Resource Strain: Not on file  Food Insecurity: Not on file  Transportation Needs: Not on file  Physical Activity: Not on file  Stress: Not on file  Social Connections: Not on file   Additional Social History:    Allergies:   Allergies  Allergen Reactions   Tomato (Diagnostic) Other (See Comments)    Tongue break out.     Labs:  Results for orders placed or performed during the hospital encounter of 04/23/21 (from the past 48 hour(s))  POC CBG, ED     Status: Abnormal   Collection Time: 04/25/21 12:25 PM  Result Value Ref Range   Glucose-Capillary 131 (H) 70 - 99 mg/dL    Comment: Glucose reference range applies only to samples  taken after fasting for at least 8 hours.    Current Facility-Administered Medications  Medication Dose Route Frequency Provider Last Rate Last Admin   amLODipine (NORVASC) tablet 5 mg  5 mg Oral Daily Leevy-Johnson, Leeah Politano A, NP   5 mg at 04/26/21 0920   divalproex (DEPAKOTE) DR tablet 250 mg  250 mg Oral Q12H Chalmers Guest, NP   250 mg at 04/26/21 0920   OLANZapine zydis (ZYPREXA) disintegrating tablet 5 mg  5 mg Oral Q8H PRN Leevy-Johnson, Khloei Spiker A, NP       And   LORazepam (ATIVAN) tablet 1 mg  1 mg Oral PRN Leevy-Johnson, Nasirah Sachs A, NP        OLANZapine zydis (ZYPREXA) disintegrating tablet 10 mg  10 mg Oral Daily Leevy-Johnson, Shawndell Varas A, NP   10 mg at 04/26/21 0921   temazepam (RESTORIL) capsule 15 mg  15 mg Oral QHS PRN Leevy-Johnson, Kamil Hanigan A, NP   15 mg at 04/26/21 0040   Current Outpatient Medications  Medication Sig Dispense Refill   diphenhydrAMINE (BENADRYL) 25 mg capsule Take 1 capsule (25 mg total) by mouth at bedtime as needed for sleep. 30 capsule 1   divalproex (DEPAKOTE) 500 MG DR tablet Take 1 tablet (500 mg total) by mouth 3 (three) times daily. Take 1 in the morning, and 2 at night (Patient taking differently: Take 500 mg by mouth 2 (two) times daily. 500mg  am, and 1000mg  in the evening) 60 tablet 1   ibuprofen (ADVIL) 200 MG tablet Take 600 mg by mouth every 6 (six) hours as needed for mild pain.     temazepam (RESTORIL) 30 MG capsule Take 1 capsule (30 mg total) by mouth at bedtime as needed for sleep. 30 capsule 1   lithium carbonate (LITHOBID) 300 MG CR tablet Take 1 tablet (300 mg total) by mouth at bedtime. (Patient not taking: Reported on 04/25/2021) 30 tablet 1    Musculoskeletal: Strength & Muscle Tone: within normal limits Gait & Station: normal Patient leans: N/A  Psychiatric Specialty Exam:  Presentation  General Appearance: Casual  Eye Contact:Fair  Speech:Normal Rate; Clear and Coherent  Speech Volume:Normal  Handedness: No data recorded  Mood and Affect  Mood:Dysphoric  Affect:Non-Congruent   Thought Process  Thought Processes:Disorganized  Descriptions of Associations:Tangential  Orientation:Partial  Thought Content:Tangential  History of Schizophrenia/Schizoaffective disorder:Yes  Duration of Psychotic Symptoms:Greater than six months  Hallucinations:Hallucinations: Auditory Description of Auditory Hallucinations: voices  Ideas of Reference:None  Suicidal Thoughts:Suicidal Thoughts: No  Homicidal Thoughts:Homicidal Thoughts: No   Sensorium  Memory:Immediate  Fair; Remote Fair; Recent [place]   Executive Functions  Concentration:Fair  Attention Span:Poor  [place]   Psychomotor Activity  Psychomotor Activity:Psychomotor Activity: Restlessness   Assets  Assets:Resilience; Social Support; Housing; Financial Resources/Insurance   Sleep  Sleep:Sleep: Fair   Physical Exam: Physical Exam Vitals and nursing note reviewed.  Constitutional:      Appearance: She is not ill-appearing.  HENT:     Head: Normocephalic.     Nose: Nose normal.     Mouth/Throat:     Mouth: Mucous membranes are moist.     Pharynx: Oropharynx is clear.  Eyes:     Pupils: Pupils are equal, round, and reactive to light.  Cardiovascular:     Rate and Rhythm: Normal rate.  Pulmonary:     [place]: Pulmonary effort is normal.  Musculoskeletal:        General: Normal range of motion.  Cervical back: Normal range of motion.  Skin:    General: Skin is warm and dry.  Neurological:     Mental Status: She is alert. Mental status is at baseline.  Psychiatric:        Attention and Perception: She perceives auditory hallucinations.        Mood and Affect: Affect is blunt.        Speech: Speech is tangential.        Behavior: Behavior is cooperative.        Thought Content: Thought content does not include homicidal or suicidal ideation. Thought content does not include homicidal or suicidal plan.        Cognition and Memory: Cognition and memory normal.        Judgment: Judgment is impulsive and inappropriate.   Review of Systems  Cardiovascular:  Negative for chest pain and palpitations.  Neurological:  Negative for focal weakness, loss of consciousness and weakness.  Psychiatric/Behavioral:  Positive for hallucinations. Negative for substance abuse and suicidal ideas.   All other systems reviewed and are negative. Blood pressure (!) 168/94, pulse 88, temperature 97.6 F (36.4  C), temperature source Oral, resp. rate 19, height 5\' 4"  (1.626 m), SpO2 99 %. Body mass index is 25.4 kg/m.  Treatment Plan Summary: Daily contact with patient to assess and evaluate symptoms and progress in treatment, Medication management, and Plan seek inpatient psychiatric hospitalization for further observation, stabilization, and treatment.   Disposition: Recommend psychiatric Inpatient admission when medically cleared. Supportive therapy provided about ongoing stressors. Discussed crisis plan, support from social network, calling 911, coming to the Emergency Department, and calling Suicide Hotline.  Inda Merlin, NP 04/26/2021 12:46 PM

## 2021-04-26 NOTE — Progress Notes (Signed)
Per Suzzanne Cloud, patient meets criteria for inpatient treatment. There are no available or appropriate beds at [facility] today. CSW faxed referrals to the following facilities for review:  [place] N/A [place], [place] [address] [phone] --  [place] N/A [address] [place], [place] [address] [phone] --  [place] [place] [address] [phone] --  [facility]  Pending - Request Sent N/A [address] [place] [address] [phone] --  [place] Sent N/A [address] [place] [address] [phone] --  [facility]  Pending - No Request Sent N/A [address] [place] [address] [phone] --  [facility]  Pending - No Request Sent N/A [address] [place] [address] [phone] --  [facility]  Pending - No Request Sent N/A [address] [place] [address] [place] --  [facility]  Pending - No Request Sent N/A [address] [place] [address] [phone] --  [facility]  Pending - No Request Sent N/A [address] [place] [address] [phone] --  [place]  Pending - No Request Sent N/A [address], [place] [address] [phone] --  [facility]  Pending - No Request Sent N/A [address] [place] [address] [phone] --   TTS will continue to seek bed placement.  Glennie Isle, MSW, [place], LCAS-A Phone: [phone] Disposition/TOC

## 2021-04-27 DIAGNOSIS — F3113 Bipolar disorder, current episode manic without psychotic features, severe: Secondary | ICD-10-CM | POA: Diagnosis not present

## 2021-04-27 LAB — CBG MONITORING, ED
Glucose-Capillary: 114 mg/dL — ABNORMAL HIGH (ref 70–99)
Glucose-Capillary: 94 mg/dL (ref 70–99)

## 2021-04-27 LAB — VALPROIC ACID LEVEL: Valproic Acid Lvl: 60 ug/mL (ref 50.0–100.0)

## 2021-04-27 MED ORDER — DIVALPROEX SODIUM 500 MG PO DR TAB
500.0000 mg | DELAYED_RELEASE_TABLET | Freq: Two times a day (BID) | ORAL | Status: DC
  Administered 2021-04-27 – 2021-04-30 (×6): 500 mg via ORAL
  Filled 2021-04-27 (×6): qty 1

## 2021-04-27 MED ORDER — IBUPROFEN 200 MG PO TABS: 600.0000 mg | ORAL_TABLET | Freq: Once | ORAL | Status: DC

## 2021-04-27 MED ORDER — DIVALPROEX SODIUM 250 MG PO DR TAB
250.0000 mg | DELAYED_RELEASE_TABLET | ORAL | Status: AC
  Administered 2021-04-27: 250 mg via ORAL
  Filled 2021-04-27: qty 1

## 2021-04-27 NOTE — ED Notes (Signed)
After two attempts I was unable to get the pt's morning lab draw. Paged phlebotomy and they will come up and to draw the pt's labs this am.

## 2021-04-27 NOTE — ED Provider Notes (Signed)
Emergency Medicine Observation Re-evaluation Note  Virdia Ziesmer is a 70 y.o. female, seen on rounds today.  Pt initially presented to the ED for complaints of IVC  Currently, the patient is Sitting on bed watching TV.  Physical Exam  BP (!) 158/82 (BP Location: Left Arm)   Pulse 79   Temp 98.9 F (37.2 C) (Oral)   Resp 18   Ht 5\' 4"  (1.626 m)   SpO2 98%   BMI 25.40 kg/m  Physical Exam General: Awake and alert Lungs: Resp even and unlabored Psych: Calm and cooperative  ED Course / MDM  EKG:EKG Interpretation  Date/Time:  Friday April 25 2021 12:16:23 EDT Ventricular Rate:  86 PR Interval:  158 QRS Duration: 142 QT Interval:  400 QTC Calculation: 478 R Axis:   58 Text Interpretation: Normal sinus rhythm Right bundle branch block Abnormal ECG since last tracing no significant change Confirmed by Daleen Bo [phone]) on 04/25/2021 12:30:13 PM  I have reviewed the labs performed to date as well as medications administered while in observation.  Recent changes in the last 24 hours include none.  Plan  Current plan is for inpatient psych admission. Orissa Halliday is under involuntary commitment.      Truddie Hidden, MD 04/27/21 [phone]

## 2021-04-27 NOTE — Consult Note (Addendum)
[facility] Consult   Reason for Consult:  mania/psychosis Referring Physician:  [name] PA-C Patient Identification: Shelby Jordan MRN:  [id number] Principal Diagnosis: Bipolar I disorder, most recent episode (or current) manic ([place]) Diagnosis:  Principal Problem:   Bipolar I disorder, most recent episode (or current) manic ([place])   Total Time spent with patient: 20 minutes  Subjective:   Shelby Jordan is a 70 y.o. female patient admitted with mania/psychosis.   On assessment patient presents alert, delusional and tangential; states "a pointy nose woman" whom she believed was an alien came in her room last night and took her to "another planet". Patient made several paranoid statements about "people after me"; provider reassured patient she was safe. She denies any suicidal ideations; made several homicidal statements regarding the potential people she felt were after her and were disguising themselves as hospital workers. She is endorsing auditory and visual hallucinations, does appear to be responding to some external/internal stimuli at the time of this assessment.   HPI:  Shelby Jordan is a 70 year old patient with a past history of schizophrenia and bipolar I who presented to [facility] via IVC  due to mania. Patient currently living in [place] with her daughter who reports patient having a 30+ year psychiatric history with over 30 hospitalizations.  Past Psychiatric History: schizophrenia, bipolar I  Risk to Self:   Risk to Others:   Prior Inpatient Therapy:   Prior Outpatient Therapy:    Past Medical History:  Past Medical History:  Diagnosis Date   Bipolar 1 disorder ([place])    Hepatitis C    Hypertension     Past Surgical History:  Procedure Laterality Date   APPENDECTOMY     TOE AMPUTATION     Family History: History reviewed. No pertinent family history. Family Psychiatric  History: not noted Social History:  Social History   Substance and Sexual  Activity  Alcohol Use Never     Social History   Substance and Sexual Activity  Drug Use Never    Social History   Socioeconomic History   Marital status: Legally Separated    Spouse name: Not on file   Number of children: Not on file   Years of education: Not on file   Highest education level: Not on file  Occupational History   Not on file  Tobacco Use   Smoking status: Never   Smokeless tobacco: Never  Substance and Sexual Activity   Alcohol use: Never   Drug use: Never   Sexual activity: Not on file  Other Topics Concern   Not on file  Social History Narrative   Not on file   Social Determinants of Health   Financial Resource Strain: Not on file  Food Insecurity: Not on file  Transportation Needs: Not on file  Physical Activity: Not on file  Stress: Not on file  Social Connections: Not on file   Additional Social History:    Allergies:   Allergies  Allergen Reactions   Tomato (Diagnostic) Other (See Comments)    Tongue break out.     Labs:  Results for orders placed or performed during the hospital encounter of 04/23/21 (from the past 48 hour(s))  POC CBG, ED     Status: Abnormal   Collection Time: 04/25/21 12:25 PM  Result Value Ref Range   Glucose-Capillary 131 (H) 70 - 99 mg/dL    Comment: Glucose reference range applies only to samples taken after fasting for at least 8 hours.  Urinalysis, Routine w reflex microscopic Urine, Clean Catch     Status: Abnormal   Collection Time: 04/26/21  3:20 PM  Result Value Ref Range   Color, Urine STRAW (A) YELLOW   APPearance CLEAR CLEAR   Specific Gravity, Urine 1.009 1.005 - 1.030   pH 6.0 5.0 - 8.0   Glucose, UA NEGATIVE NEGATIVE mg/dL   Hgb urine dipstick NEGATIVE NEGATIVE   Bilirubin Urine NEGATIVE NEGATIVE   Ketones, ur NEGATIVE NEGATIVE mg/dL   Protein, ur NEGATIVE NEGATIVE mg/dL   Nitrite NEGATIVE NEGATIVE   Leukocytes,Ua NEGATIVE NEGATIVE   RBC / HPF 0-5 0 - 5 RBC/hpf   WBC, UA 0-5 0 - 5  WBC/hpf   Bacteria, UA RARE (A) NONE SEEN   Squamous Epithelial / LPF 6-10 0 - 5    Comment: Performed at [facility], [place] [address] [place], [place] [address]  POC CBG, ED     Status: None   Collection Time: 04/26/21  5:37 PM  Result Value Ref Range   Glucose-Capillary 93 70 - 99 mg/dL    Comment: Glucose reference range applies only to samples taken after fasting for at least 8 hours.  POC CBG, ED     Status: Abnormal   Collection Time: 04/27/21  8:05 AM  Result Value Ref Range   Glucose-Capillary 114 (H) 70 - 99 mg/dL    Comment: Glucose reference range applies only to samples taken after fasting for at least 8 hours.   Comment 1 Notify RN   Valproic acid level     Status: None   Collection Time: 04/27/21 10:16 AM  Result Value Ref Range   Valproic Acid Lvl 60 50.0 - 100.0 ug/mL    Comment: Performed at [facility], [place] [address] [place], [place] [address]  POC CBG, ED     Status: None   Collection Time: 04/27/21 12:11 PM  Result Value Ref Range   Glucose-Capillary 94 70 - 99 mg/dL    Comment: Glucose reference range applies only to samples taken after fasting for at least 8 hours.   Comment 1 Notify RN     Current Facility-Administered Medications  Medication Dose Route Frequency Provider Last Rate Last Admin   amLODipine (NORVASC) tablet 5 mg  5 mg Oral Daily Leevy-Johnson, Nyzier Boivin A, NP   5 mg at 04/27/21 1146   divalproex (DEPAKOTE) DR tablet 500 mg  500 mg Oral Q12H Leevy-Johnson, Marcy Sookdeo A, NP       OLANZapine zydis (ZYPREXA) disintegrating tablet 5 mg  5 mg Oral Q8H PRN Leevy-Johnson, Zira Helinski A, NP       And   LORazepam (ATIVAN) tablet 1 mg  1 mg Oral PRN Leevy-Johnson, Ahlam Piscitelli A, NP       OLANZapine zydis (ZYPREXA) disintegrating tablet 10 mg  10 mg Oral Daily Leevy-Johnson, Paullette Mckain A, NP   10 mg at 04/27/21 1146   temazepam (RESTORIL) capsule 15 mg  15 mg Oral QHS PRN Leevy-Johnson, Tabria Steines A, NP   15 mg at 04/26/21 2149    Current Outpatient Medications  Medication Sig Dispense Refill   diphenhydrAMINE (BENADRYL) 25 mg capsule Take 1 capsule (25 mg total) by mouth at bedtime as needed for sleep. 30 capsule 1   divalproex (DEPAKOTE) 500 MG DR tablet Take 1 tablet (500 mg total) by mouth 3 (three) times daily. Take 1 in the morning, and 2 at night (Patient taking differently: Take 500 mg by mouth 2 (two) times daily. 500mg  am,  and 1000mg  in the evening) 60 tablet 1   ibuprofen (ADVIL) 200 MG tablet Take 600 mg by mouth every 6 (six) hours as needed for mild pain.     temazepam (RESTORIL) 30 MG capsule Take 1 capsule (30 mg total) by mouth at bedtime as needed for sleep. 30 capsule 1   lithium carbonate (LITHOBID) 300 MG CR tablet Take 1 tablet (300 mg total) by mouth at bedtime. (Patient not taking: Reported on 04/25/2021) 30 tablet 1    Musculoskeletal: Strength & Muscle Tone: within normal limits Gait & Station: normal Patient leans: N/A  Psychiatric Specialty Exam:  Presentation  General Appearance: Casual  Eye Contact:Fair  Speech:Pressured  Speech Volume:Normal  Handedness: No data recorded  Mood and Affect  Mood:Dysphoric  Affect:Non-Congruent; Labile   Thought Process  Thought Processes:Disorganized  Descriptions of Associations:Loose  Orientation:Partial  Thought Content:Illogical; Paranoid Ideation; Scattered; Tangential  History of Schizophrenia/Schizoaffective disorder:Yes  Duration of Psychotic Symptoms:Greater than six months  Hallucinations:Hallucinations: Auditory; Visual Description of Auditory Hallucinations: voices Description of Visual Hallucinations: aliens  Ideas of Reference:Delusions; Paranoia; Percusatory  Suicidal Thoughts:Suicidal Thoughts: No  Homicidal Thoughts:Homicidal Thoughts: No   Sensorium  Memory:Immediate Fair; Recent Fair; Remote Fair  Judgment:Impaired  Insight:Poor   Executive Functions  Concentration:Poor  Attention  Span:Poor  Recall:Poor  Fund of Knowledge:Fair  Language:Fair   Psychomotor Activity  Psychomotor Activity:Psychomotor Activity: Restlessness   Assets  Assets:Social Support; Physical Health; Resilience   Sleep  Sleep:Sleep: Fair   Physical Exam: Physical Exam Vitals and nursing note reviewed.  Constitutional:      Appearance: Normal appearance. She is not ill-appearing, toxic-appearing or diaphoretic.  HENT:     Head: Normocephalic.     Nose: Nose normal.     Mouth/Throat:     Mouth: Mucous membranes are moist.     Pharynx: Oropharynx is clear.  Eyes:     Pupils: Pupils are equal, round, and reactive to light.  Cardiovascular:     Pulses: Normal pulses.  Pulmonary:     [place]: Pulmonary effort is normal.  Musculoskeletal:        General: Normal range of motion.     Cervical back: Normal range of motion.  Skin:    General: Skin is warm and dry.  Neurological:     General: No focal deficit present.     Mental Status: She is alert.  Psychiatric:        Attention and Perception: She perceives auditory hallucinations. She does not perceive visual hallucinations.        Mood and Affect: Affect is labile.        Speech: Speech is rapid and pressured and tangential.        Behavior: Behavior is cooperative.        Thought Content: Thought content is paranoid and delusional.        Cognition and Memory: Memory normal.        Judgment: Judgment is impulsive.   Review of Systems  Psychiatric/Behavioral:  Positive for hallucinations.   All other systems reviewed and are negative. Blood pressure (!) 182/91, pulse 90, temperature 98 F (36.7 C), temperature source Oral, resp. rate 18, height 5\' 4"  (1.626 m), SpO2 100 %. Body mass index is 25.4 kg/m.  Treatment Plan Summary: Daily contact with patient to assess and evaluate symptoms and progress in treatment, Medication management, and Plan continue to seek inpatient hospitalization for further observation,  stabilization, and treatment. Current Depakote level 60; Depakote increased to 500 mg  q12 hrs. Will continue to monitor levels and adjust medications.   Disposition: Recommend psychiatric Inpatient admission when medically cleared. Supportive therapy provided about ongoing stressors. Discussed crisis plan, support from social network, calling 911, coming to the Emergency Department, and calling Suicide Hotline.  Inda Merlin, NP 04/27/2021 12:16 PM

## 2021-04-27 NOTE — ED Notes (Signed)
Pt alert this shift.. Medication compliant this shift. Denied SI/ HI. Pt talking to self. Cooperative. Pt ambulatory .  Pt can complete ADLs.

## 2021-04-27 NOTE — ED Notes (Signed)
Pt got her dinner tray.  

## 2021-04-28 DIAGNOSIS — F3113 Bipolar disorder, current episode manic without psychotic features, severe: Secondary | ICD-10-CM | POA: Diagnosis not present

## 2021-04-28 LAB — CBG MONITORING, ED: Glucose-Capillary: 128 mg/dL — ABNORMAL HIGH (ref 70–99)

## 2021-04-28 NOTE — ED Notes (Signed)
Pt remained calm throughout the shift. Pt was able to complete all ADLs by herself.

## 2021-04-28 NOTE — ED Provider Notes (Signed)
Emergency Medicine Observation Re-evaluation Note  Shanah Guimaraes is a 70 y.o. female, seen on rounds today.  Pt initially presented to the ED for complaints of IVC  Currently, the patient is awake, watching TV.  Physical Exam  BP (!) 184/101 (BP Location: Right Arm)   Pulse 94   Temp 98 F (36.7 C) (Oral)   Resp 18   Ht 5\' 4"  (1.626 m)   SpO2 98%   BMI 25.40 kg/m  Physical Exam General: Awake and alert Lungs: resp even and unlabored Psych: Calm and cooperative  ED Course / MDM  EKG:EKG Interpretation  Date/Time:  Friday April 25 2021 12:16:23 EDT Ventricular Rate:  86 PR Interval:  158 QRS Duration: 142 QT Interval:  400 QTC Calculation: 478 R Axis:   58 Text Interpretation: Normal sinus rhythm Right bundle branch block Abnormal ECG since last tracing no significant change Confirmed by Daleen Bo [phone]) on 04/25/2021 12:30:13 PM  I have reviewed the labs performed to date as well as medications administered while in observation.  Recent changes in the last 24 hours include none.  Plan  Current plan is for inpatient psych admission. Chanta Lormand is under involuntary commitment.      Truddie Hidden, MD 04/28/21 [phone]

## 2021-04-28 NOTE — Progress Notes (Signed)
Continued recommendation for inpatient psychiatry per Mike Craze, NP.  Patient was referred out again at this time. See original note written by Glennie Isle, LCSWA for full list of facilities. Situation ongoing, CSW will continue to monitor and update note as more information becomes available.   Signed:  Durenda Hurt, MSW, LCSWA, LCASA 04/28/2021 1:08 PM

## 2021-04-28 NOTE — BH Assessment (Signed)
Patient seen this date to assess current mental health status. Patient remains delusional and exhibits signs of paranoia asking this Probation officer "who are you with?" As this writer attempted to explain why she was being seen patient said "you are one of them" and turned to face the wall in her bed. Patient refused to interact with this Probation officer. Parks NP recommends a continued inpatient admission as bed placement is investigated.

## 2021-04-28 NOTE — ED Notes (Signed)
Patient remains delusional but responds well to redirection.

## 2021-04-29 DIAGNOSIS — F3113 Bipolar disorder, current episode manic without psychotic features, severe: Secondary | ICD-10-CM | POA: Diagnosis not present

## 2021-04-29 LAB — BASIC METABOLIC PANEL
Anion gap: 10 (ref 5–15)
BUN: 29 mg/dL — ABNORMAL HIGH (ref 8–23)
CO2: 22 mmol/L (ref 22–32)
Calcium: 9.7 mg/dL (ref 8.9–10.3)
Chloride: 106 mmol/L (ref 98–111)
Creatinine, Ser: 1.28 mg/dL — ABNORMAL HIGH (ref 0.44–1.00)
GFR, Estimated: 45 mL/min — ABNORMAL LOW (ref >60)
Glucose, Bld: 119 mg/dL — ABNORMAL HIGH (ref 70–99)
Potassium: 4.3 mmol/L (ref 3.5–5.1)
Sodium: 138 mmol/L (ref 135–145)

## 2021-04-29 LAB — RESP PANEL BY RT-PCR (FLU A&B, COVID) ARPGX2
Influenza A by PCR: NEGATIVE
Influenza B by PCR: NEGATIVE
SARS Coronavirus 2 by RT PCR: NEGATIVE

## 2021-04-29 NOTE — BH Assessment (Signed)
[facility] Assessment Progress Note   Per Jinny Blossom, NP, this pt continues require psychiatric hospitalization at this time.  Pt presents under IVC initiated by mobile crisis, which EDP Shirlyn Goltz, MD has upheld.  At 14:08 Jeanett Schlein calls from [facility] to report that pt has been accepted to their facility by Genia Del, MD.  They will be ready to receive pt tomorrow, 04/30/2021 after 11:00.  EDP Fredia Sorrow, MD and pt's nurse, Cleotis Lema, have been notified, and Cleotis Lema agrees to call report to [phone].  Pt is to be transported via [facility].  [facility] Coordinator [phone]

## 2021-04-29 NOTE — ED Provider Notes (Signed)
Emergency Medicine Observation Re-evaluation Note  Shelby Jordan is a 70 y.o. female, seen on rounds today.  Pt initially presented to the ED for complaints of IVC  Currently, the patient is IVC medically cleared awaiting behavioral health placement.  Physical Exam  BP 138/71 (BP Location: Right Arm)   Pulse 72   Temp 98 F (36.7 C) (Oral)   Resp 17   Ht 1.626 m (5\' 4" )   Wt 67 kg   SpO2 92%   BMI 25.35 kg/m  Physical Exam General: No acute distress Cardiac:  Lungs:  Psych: Baseline  ED Course / MDM  EKG:EKG Interpretation  Date/Time:  Friday April 25 2021 12:16:23 EDT Ventricular Rate:  86 PR Interval:  158 QRS Duration: 142 QT Interval:  400 QTC Calculation: 478 R Axis:   58 Text Interpretation: Normal sinus rhythm Right bundle branch block Abnormal ECG since last tracing no significant change Confirmed by Daleen Bo [phone]) on 04/25/2021 12:30:13 PM  I have reviewed the labs performed to date as well as medications administered while in observation.  Recent changes in the last 24 hours include formally medically cleared.  Patient's Depakote levels have returned to therapeutic.Marland Kitchen  Plan  Current plan is for placement by behavioral health. Flara Filter is under involuntary commitment.      Fredia Sorrow, MD 04/29/21 [phone]

## 2021-04-29 NOTE — ED Notes (Signed)
Pt got her dinner tray.  

## 2021-04-29 NOTE — ED Provider Notes (Signed)
Possible excepting center for tomorrow once repeat BMP I think that concern is for her renal insufficiency.  Which is not severe.  And they want a more current COVID testing all this has been ordered.   Fredia Sorrow, MD 04/29/21 [phone]

## 2021-04-29 NOTE — ED Notes (Signed)
Pt is given a sandwich and milk.

## 2021-04-29 NOTE — BH Assessment (Signed)
[facility] Assessment Progress Note   Per Jinny Blossom, NP this involuntary pt continues to require psychiatric hospitalization, preferably at a facility providing specialty care for geriatric patients.  Jeanett Schlein from [facility] [phone]) calls requesting supplemental information, which this writer is currently in the process of faxing.  Final disposition pending as of this writing.  Jalene Mullet, [place] Coordinator [phone]

## 2021-04-29 NOTE — ED Notes (Addendum)
Per [facility] notes pt will be transferred to [facility] tomorrow after 1100. Report needs to be called in the morning before transferred.

## 2021-04-29 NOTE — ED Provider Notes (Signed)
Patient was medically cleared on the note from October 6.  Patient's had repeat valproic acid which shows now is therapeutic.  Patient definitely medically cleared for behavioral health admission.   Fredia Sorrow, MD 04/29/21 [phone]

## 2021-04-29 NOTE — ED Notes (Signed)
Pt has been calm and cooperative throughout shift. Pt was able to complete all ADLs herself.

## 2021-04-30 DIAGNOSIS — F3113 Bipolar disorder, current episode manic without psychotic features, severe: Secondary | ICD-10-CM | POA: Diagnosis not present

## 2021-04-30 NOTE — ED Provider Notes (Signed)
Emergency Medicine Observation Re-evaluation Note  Shelby Jordan is a 70 y.o. female, seen on rounds today.  Pt initially presented to the ED for complaints of IVC  Currently, the patient is resting comfortably  Physical Exam  BP (!) 189/89 (BP Location: Right Arm)   Pulse 93   Temp 97.6 F (36.4 C) (Oral)   Resp 20   Ht 5\' 4"  (1.626 m)   Wt 67 kg   SpO2 95%   BMI 25.35 kg/m  Physical Exam General: NAD Cardiac: Regular rate Lungs: No respiratory distress Psych: Stable  ED Course / MDM  EKG:EKG Interpretation  Date/Time:  Friday April 25 2021 12:16:23 EDT Ventricular Rate:  86 PR Interval:  158 QRS Duration: 142 QT Interval:  400 QTC Calculation: 478 R Axis:   58 Text Interpretation: Normal sinus rhythm Right bundle branch block Abnormal ECG since last tracing no significant change Confirmed by Daleen Bo [phone]) on 04/25/2021 12:30:13 PM  I have reviewed the labs performed to date as well as medications administered while in observation.    Plan  Current plan is for transfer to inpatient psych facility, accepting MD DR Arman Bogus is under involuntary commitment.      Wyvonnia Dusky, MD 04/30/21 [phone]

## 2021-05-07 DIAGNOSIS — J441 Chronic obstructive pulmonary disease with (acute) exacerbation: Secondary | ICD-10-CM | POA: Diagnosis not present

## 2021-07-22 IMAGING — CT CT HEAD W/O CM
4 series · 16 of 47 positions shown, 18 images · non-contrast
Comparison: None.

CLINICAL DATA: Manic behavior, insomnia, bipolar disorder,
hypertension

EXAM:
CT HEAD WITHOUT CONTRAST
TECHNIQUE: Contiguous axial images were obtained from the base of the skull
through the vertex without intravenous contrast.

[Series 2: head bone · axial · 0.43mm/px · z∈[-70,-42]mm · 3 of 70 slices shown]
[im 7/70  bone]
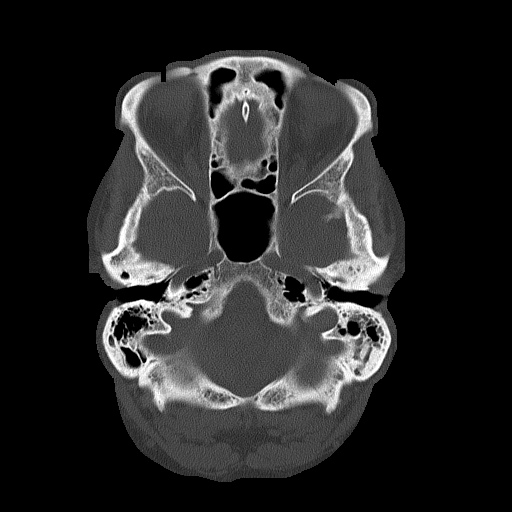
[im 14/70  bone]
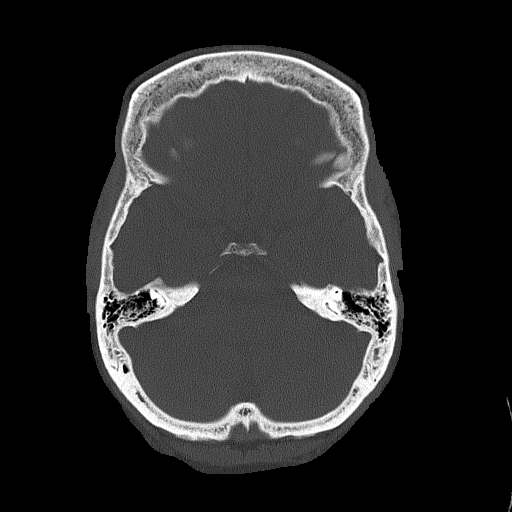
[im 21/70  bone]
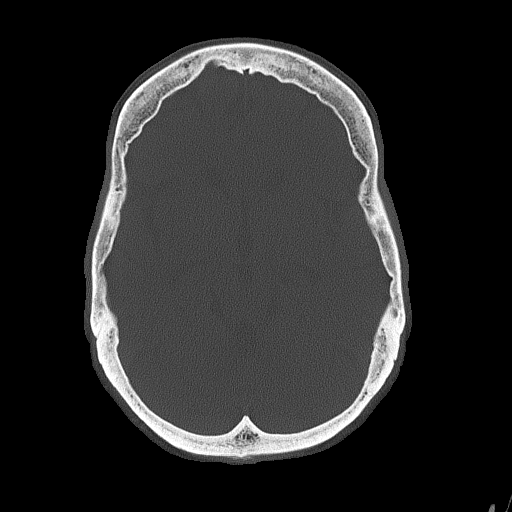

[Series 3: head wo · axial · 0.43mm/px · z∈[-67,+33]mm · 7 of 28 slices shown, 9 images]
[im 4/28  brain]
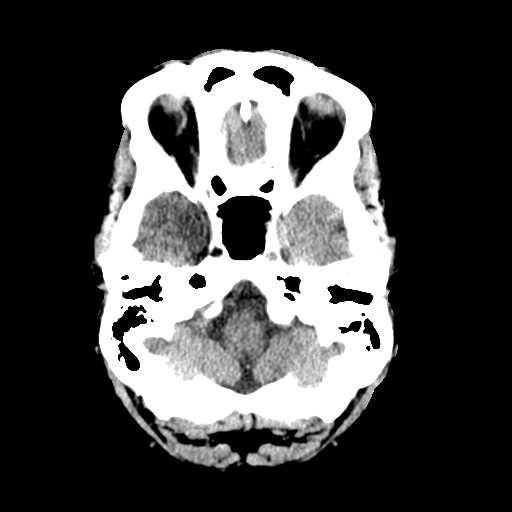
[im 4/28  bone]
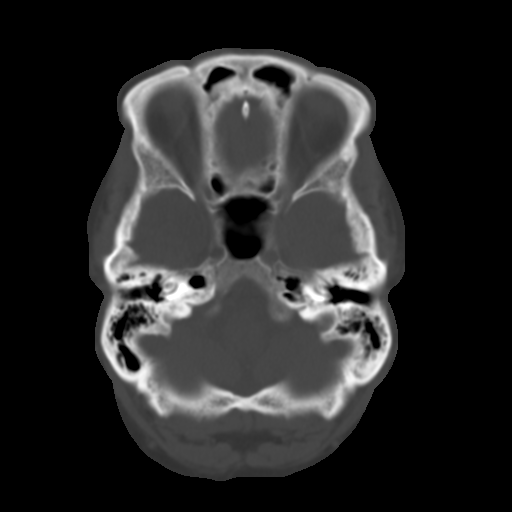
[im 7/28  brain]
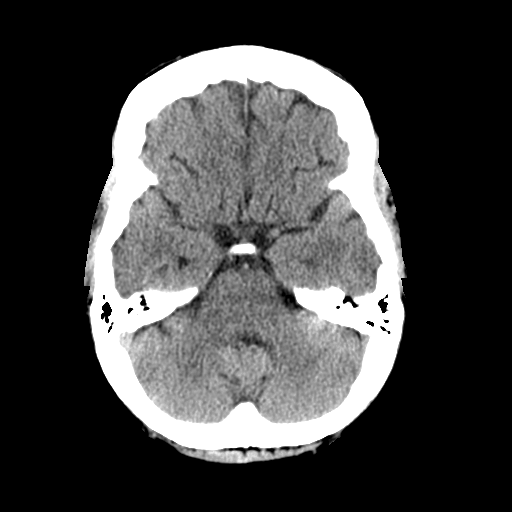
[im 11/28  brain]
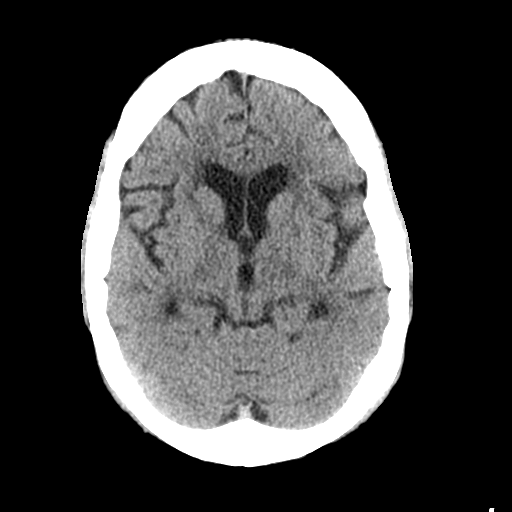
[im 14/28  brain]
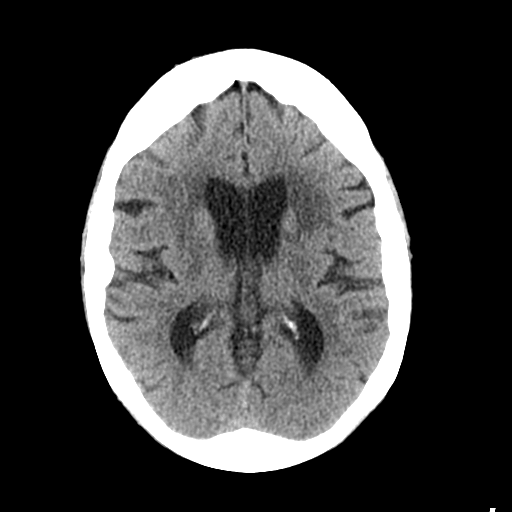
[im 17/28  brain]
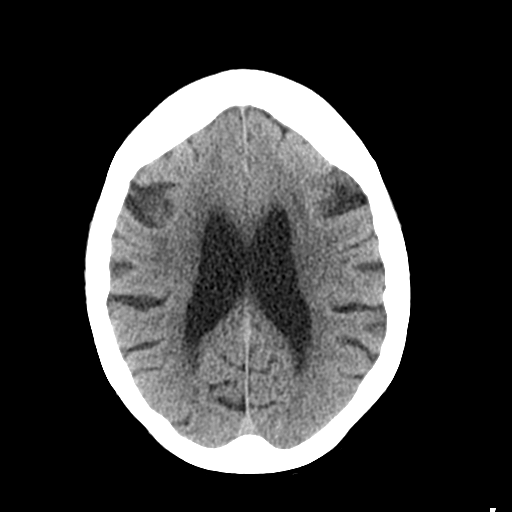
[im 17/28  bone]
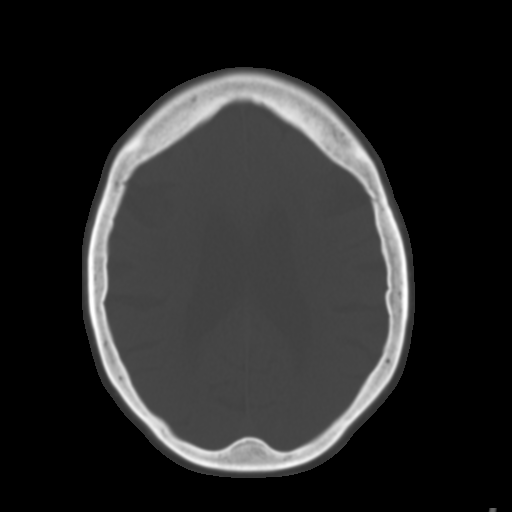
[im 21/28  brain]
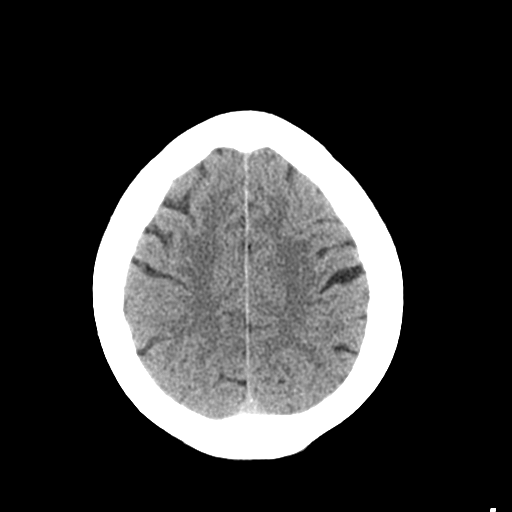
[im 24/28  brain]
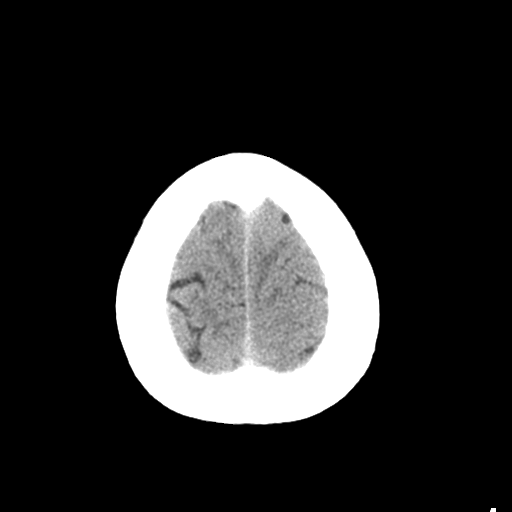

[Series 4: coronal soft tissue · coronal · 0.32mm/px · 3 of 65 slices shown]
[im 22/65  brain]
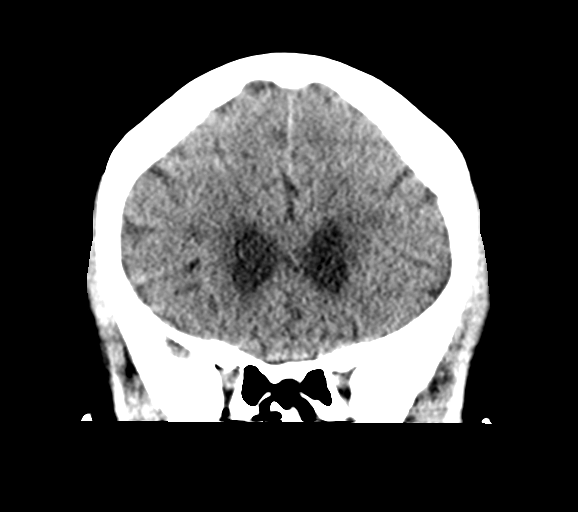
[im 29/65  brain]
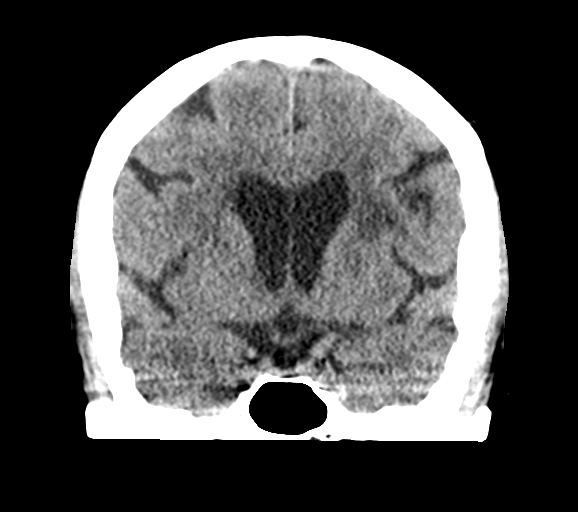
[im 36/65  brain]
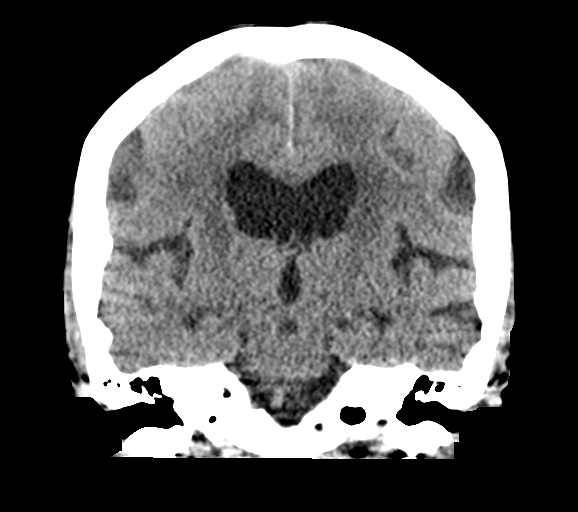

[Series 5: sagittal soft tissue · sagittal · 0.35mm/px · 3 of 53 slices shown]
[im 18/53  brain]
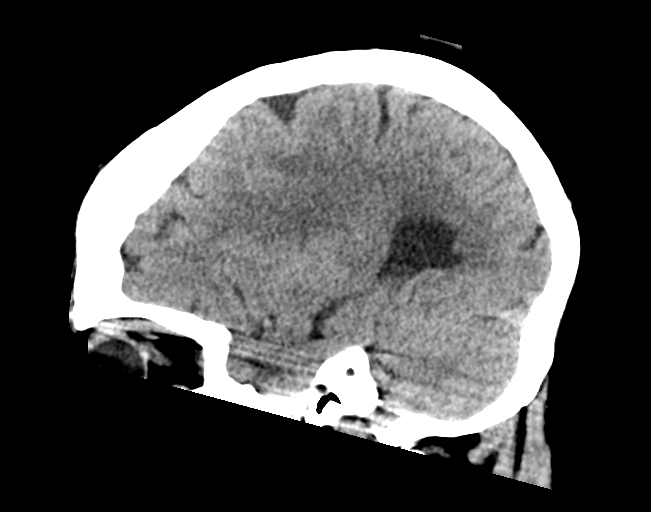
[im 27/53  brain]
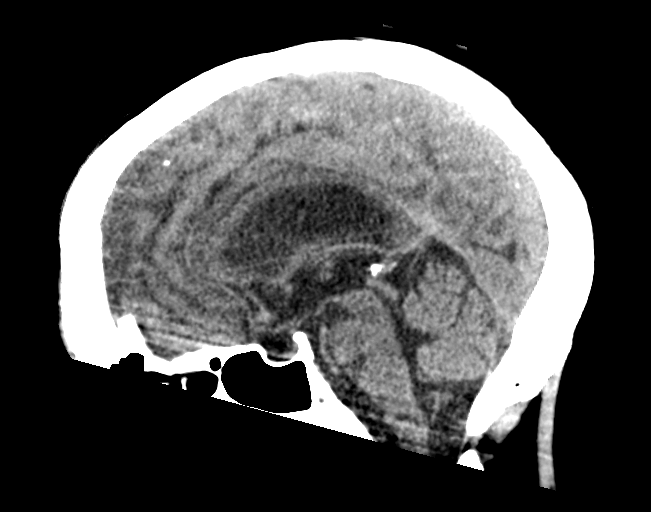
[im 35/53  brain]
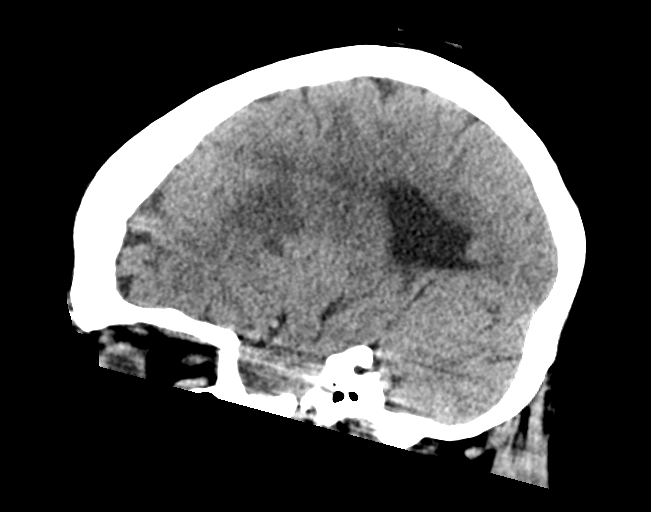

[16 of 47 positions shown; findings below may reference images not displayed]

FINDINGS: Brain: Chronic lacunar infarct left basal ganglia. Hypodensities
within the bilateral periventricular white matter and basal ganglia
consistent with age-indeterminate small vessel with ischemic
changes. No other signs of acute infarct or hemorrhage. Lateral
ventricles and remaining midline structures are unremarkable. There
are no acute extra-axial fluid collections. There is no mass effect.

Vascular: No hyperdense vessel or unexpected calcification.

Skull: Normal. Negative for fracture or focal lesion.

Sinuses/Orbits: No acute finding.

Other: None.
IMPRESSION: 1. Age-indeterminate small vessel ischemic changes within the
periventricular white matter and basal ganglia, favor chronic.
2. Chronic left basal ganglia lacunar infarct.
3. No acute intracranial process.

## 2021-07-28 ENCOUNTER — Emergency Department (HOSPITAL_COMMUNITY)
Admission: EM | Admit: 2021-07-28 | Discharge: 2021-07-30 | Disposition: A | Discharge status: Other Acute Inpatient Hospital | Payer: Medicare Other | Attending: Emergency Medicine | Admitting: Emergency Medicine

## 2021-07-28 DIAGNOSIS — Z79899 Other long term (current) drug therapy: Secondary | ICD-10-CM | POA: Diagnosis not present

## 2021-07-28 DIAGNOSIS — I10 Essential (primary) hypertension: Secondary | ICD-10-CM | POA: Diagnosis not present

## 2021-07-28 DIAGNOSIS — Z20822 Contact with and (suspected) exposure to covid-19: Secondary | ICD-10-CM | POA: Diagnosis not present

## 2021-07-28 DIAGNOSIS — Z8659 Personal history of other mental and behavioral disorders: Secondary | ICD-10-CM | POA: Diagnosis not present

## 2021-07-28 DIAGNOSIS — R000 Tachycardia, unspecified: Secondary | ICD-10-CM | POA: Diagnosis not present

## 2021-07-28 DIAGNOSIS — Z046 Encounter for general psychiatric examination, requested by authority: Secondary | ICD-10-CM | POA: Insufficient documentation

## 2021-07-28 DIAGNOSIS — F329 Major depressive disorder, single episode, unspecified: Secondary | ICD-10-CM | POA: Insufficient documentation

## 2021-07-29 ENCOUNTER — Other Ambulatory Visit: Payer: Self-pay

## 2021-07-29 ENCOUNTER — Encounter (HOSPITAL_COMMUNITY): Payer: Self-pay

## 2021-07-29 DIAGNOSIS — F329 Major depressive disorder, single episode, unspecified: Secondary | ICD-10-CM | POA: Diagnosis not present

## 2021-07-29 LAB — URINALYSIS, ROUTINE W REFLEX MICROSCOPIC
Bilirubin Urine: NEGATIVE
Glucose, UA: NEGATIVE mg/dL
Hgb urine dipstick: NEGATIVE
Ketones, ur: 5 mg/dL — AB
Nitrite: NEGATIVE
Protein, ur: >=300 mg/dL — AB
Specific Gravity, Urine: 1.028 (ref 1.005–1.030)
pH: 5 (ref 5.0–8.0)

## 2021-07-29 LAB — CBC WITH DIFFERENTIAL/PLATELET
Abs Immature Granulocytes: 0.04 10*3/uL (ref 0.00–0.07)
Basophils Absolute: 0 10*3/uL (ref 0.0–0.1)
Basophils Relative: 0 %
Eosinophils Absolute: 0.1 10*3/uL (ref 0.0–0.5)
Eosinophils Relative: 1 %
HCT: 43.5 % (ref 36.0–46.0)
Hemoglobin: 14.3 g/dL (ref 12.0–15.0)
Immature Granulocytes: 0 %
Lymphocytes Relative: 32 %
Lymphs Abs: 3.1 10*3/uL (ref 0.7–4.0)
MCH: 29.5 pg (ref 26.0–34.0)
MCHC: 32.9 g/dL (ref 30.0–36.0)
MCV: 89.7 fL (ref 80.0–100.0)
Monocytes Absolute: 0.7 10*3/uL (ref 0.1–1.0)
Monocytes Relative: 8 %
Neutro Abs: 5.6 10*3/uL (ref 1.7–7.7)
Neutrophils Relative %: 59 %
Platelets: 312 10*3/uL (ref 150–400)
RBC: 4.85 MIL/uL (ref 3.87–5.11)
RDW: 14.7 % (ref 11.5–15.5)
WBC: 9.6 10*3/uL (ref 4.0–10.5)
nRBC: 0 % (ref 0.0–0.2)

## 2021-07-29 LAB — SALICYLATE LEVEL: Salicylate Lvl: <7 mg/dL — ABNORMAL LOW (ref 7.0–30.0)

## 2021-07-29 LAB — ETHANOL: Alcohol, Ethyl (B): <10 mg/dL (ref <10)

## 2021-07-29 LAB — RAPID URINE DRUG SCREEN, HOSP PERFORMED
Amphetamines: NOT DETECTED
Barbiturates: NOT DETECTED
Benzodiazepines: NOT DETECTED
Cocaine: NOT DETECTED
Opiates: NOT DETECTED
Tetrahydrocannabinol: NOT DETECTED

## 2021-07-29 LAB — COMPREHENSIVE METABOLIC PANEL
ALT: 24 U/L (ref 0–44)
AST: 21 U/L (ref 15–41)
Albumin: 4.9 g/dL (ref 3.5–5.0)
Alkaline Phosphatase: 43 U/L (ref 38–126)
Anion gap: 9 (ref 5–15)
BUN: 22 mg/dL (ref 8–23)
CO2: 26 mmol/L (ref 22–32)
Calcium: 10.2 mg/dL (ref 8.9–10.3)
Chloride: 105 mmol/L (ref 98–111)
Creatinine, Ser: 1.23 mg/dL — ABNORMAL HIGH (ref 0.44–1.00)
GFR, Estimated: 47 mL/min — ABNORMAL LOW (ref >60)
Glucose, Bld: 153 mg/dL — ABNORMAL HIGH (ref 70–99)
Potassium: 4 mmol/L (ref 3.5–5.1)
Sodium: 140 mmol/L (ref 135–145)
Total Bilirubin: 0.5 mg/dL (ref 0.3–1.2)
Total Protein: 8.5 g/dL — ABNORMAL HIGH (ref 6.5–8.1)

## 2021-07-29 LAB — LITHIUM LEVEL: Lithium Lvl: <0.06 mmol/L — ABNORMAL LOW (ref 0.60–1.20)

## 2021-07-29 LAB — RESP PANEL BY RT-PCR (FLU A&B, COVID) ARPGX2
Influenza A by PCR: NEGATIVE
Influenza B by PCR: NEGATIVE
SARS Coronavirus 2 by RT PCR: NEGATIVE

## 2021-07-29 LAB — ACETAMINOPHEN LEVEL: Acetaminophen (Tylenol), Serum: <10 ug/mL — ABNORMAL LOW (ref 10–30)

## 2021-07-29 LAB — VALPROIC ACID LEVEL: Valproic Acid Lvl: 79 ug/mL (ref 50.0–100.0)

## 2021-07-29 MED ORDER — LISINOPRIL 10 MG PO TABS
10.0000 mg | ORAL_TABLET | Freq: Every day | ORAL | Status: DC
  Administered 2021-07-29 – 2021-07-30 (×2): 10 mg via ORAL
  Filled 2021-07-29 (×2): qty 1

## 2021-07-29 MED ORDER — DIPHENHYDRAMINE HCL 25 MG PO CAPS: 25.0000 mg | ORAL_CAPSULE | Freq: Every evening | ORAL | Status: DC | PRN

## 2021-07-29 MED ORDER — DIVALPROEX SODIUM 500 MG PO DR TAB
500.0000 mg | DELAYED_RELEASE_TABLET | Freq: Three times a day (TID) | ORAL | Status: DC
  Administered 2021-07-29 – 2021-07-30 (×3): 500 mg via ORAL
  Filled 2021-07-29 (×3): qty 1

## 2021-07-29 MED ORDER — LORAZEPAM 1 MG PO TABS
1.0000 mg | ORAL_TABLET | ORAL | Status: DC | PRN
  Administered 2021-07-29 – 2021-07-30 (×2): 1 mg via ORAL
  Filled 2021-07-29 (×2): qty 1

## 2021-07-29 MED ORDER — OLANZAPINE 5 MG PO TBDP
2.5000 mg | ORAL_TABLET | Freq: Two times a day (BID) | ORAL | Status: DC
  Administered 2021-07-29 – 2021-07-30 (×2): 2.5 mg via ORAL
  Filled 2021-07-29 (×2): qty 1
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
## 2021-07-29 NOTE — ED Notes (Signed)
Pt wanded by security. 

## 2021-07-29 NOTE — ED Notes (Signed)
Belongings: pants, top sweater, head wrap at nurse desk

## 2021-07-29 NOTE — ED Notes (Signed)
Pt has been talking the whole time she has been here. Pt has not slept.

## 2021-07-29 NOTE — ED Notes (Signed)
Save blue and red down in main lab

## 2021-07-29 NOTE — ED Triage Notes (Signed)
Pt BIB police. (Voluntary) Pt states that she is sad and depressed after praying. Family told police that pt has been having hallucinations.

## 2021-07-29 NOTE — ED Provider Notes (Signed)
[facility] DEPT Provider Note   CSN: [id number] Arrival date & time: 07/28/21  2346     History  Chief Complaint  Patient presents with   Mental Health Evaluation    Shelby Jordan is a 71 y.o. female.  The history is provided by the patient and medical records.   71 y.o. F with hx of bipolar disorder, HTN, presenting to the ED with police for mental health evaluation.  She is a very difficult historian.  Patient states she is sad and depressed.  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  States she also recently had a birth day and has been re-reading the cards from her family which makes her a little sad.  Family reported that she has been having hallucinations-- when confronted about this she began yelling "I JUST FORGOT HOW TO USE MY PHONE"!  Then began asking for cranberry juice for her grandaugther because she has food poisoning.  Home Medications Prior to Admission medications   Medication Sig Start Date End Date Taking? Authorizing Provider  diphenhydrAMINE (BENADRYL) 25 mg capsule Take 1 capsule (25 mg total) by mouth at bedtime as needed for sleep. 12/27/19  Yes Clapacs, Madie Reno, MD  divalproex (DEPAKOTE) 500 MG DR tablet Take 1 tablet (500 mg total) by mouth 3 (three) times daily. Take 1 in the morning, and 2 at night Patient taking differently: Take 500 mg by mouth 2 (two) times daily. 12/27/19  Yes Clapacs, Madie Reno, MD  ibuprofen (ADVIL) 200 MG tablet Take 600 mg by mouth every 6 (six) hours as needed for mild pain.    [provider]  lithium carbonate (LITHOBID) 300 MG CR tablet Take 1 tablet (300 mg total) by mouth at bedtime. 12/27/19   Clapacs, Madie Reno, MD  temazepam (RESTORIL) 30 MG capsule Take 1 capsule (30 mg total) by mouth at bedtime as needed for sleep. 12/27/19   Clapacs, Madie Reno, MD      Allergies    Tomato (diagnostic)    Review of Systems   Review of Systems  Unable  to perform ROS: Psychiatric disorder   Physical Exam Updated Vital Signs BP (!) 188/105 (BP Location: Right Arm)    Pulse (!) 102    Temp 98.7 F (37.1 C) (Oral)    Resp 16    Ht 5\' 4"  (1.626 m)    Wt 90.7 kg    SpO2 95%    BMI 34.33 kg/m   Physical Exam Vitals and nursing note reviewed.  Constitutional:      Appearance: She is well-developed.  HENT:     Head: Normocephalic and atraumatic.  Eyes:     Conjunctiva/sclera: Conjunctivae normal.     Pupils: Pupils are equal, round, and reactive to light.  Cardiovascular:     Rate and Rhythm: Normal rate and regular rhythm.     Heart sounds: Normal heart sounds.  Pulmonary:     Effort: Pulmonary effort is normal.     Breath sounds: Normal breath sounds.  Abdominal:     General: Bowel sounds are normal.     Palpations: Abdomen is soft.  Musculoskeletal:        General: Normal range of motion.     Cervical back: Normal range of motion.  Skin:    General: Skin is warm and dry.  Neurological:     Mental Status: She is alert and oriented to person, place,  and time.  Psychiatric:     Comments: Tangential speech, requiring redirection many times, answers to questions are often non-sensical; replies "HELL NO" when asked about SI/HI    ED Results / Procedures / Treatments   Labs (all labs ordered are listed, but only abnormal results are displayed) Labs Reviewed  COMPREHENSIVE METABOLIC PANEL - Abnormal; Notable for the following components:      Result Value   Glucose, Bld 153 (*)    Creatinine, Ser 1.23 (*)    Total Protein 8.5 (*)    GFR, Estimated 47 (*)    All other components within normal limits  ACETAMINOPHEN LEVEL - Abnormal; Notable for the following components:   Acetaminophen (Tylenol), Serum <10 (*)    All other components within normal limits  SALICYLATE LEVEL - Abnormal; Notable for the following components:   Salicylate Lvl <6.1 (*)    All other components within normal limits  URINALYSIS, ROUTINE W REFLEX  MICROSCOPIC - Abnormal; Notable for the following components:   APPearance HAZY (*)    Ketones, ur 5 (*)    Protein, ur >=300 (*)    Leukocytes,Ua SMALL (*)    Bacteria, UA RARE (*)    All other components within normal limits  LITHIUM LEVEL - Abnormal; Notable for the following components:   Lithium Lvl <0.06 (*)    All other components within normal limits  RESP PANEL BY RT-PCR (FLU A&B, COVID) ARPGX2  CBC WITH DIFFERENTIAL/PLATELET  ETHANOL  VALPROIC ACID LEVEL  RAPID URINE DRUG SCREEN, HOSP PERFORMED    EKG EKG Interpretation  Date/Time:  Tuesday July 29 2021 00:08:36 EST Ventricular Rate:  103 PR Interval:  157 QRS Duration: 135 QT Interval:  370 QTC Calculation: 485 R Axis:   -24 Text Interpretation: Sinus tachycardia Right bundle branch block  similar to prior tracing Confirmed by Wynona Dove ([id number]) on 07/29/2021 5:32:20 AM  Radiology No results found.  Procedures Procedures    Medications Ordered in ED Medications - No data to display  ED Course/ Medical Decision Making/ A&P                           Medical Decision Making  71 year old female presenting to the ED for psychiatric evaluation.  Has history of bipolar disorder.  History is difficult to ascertain as she has very tangential thoughts and cannot stay on topic.  She is requiring redirection several times during conversation.  She denies any physical complaints and denies any suicidal or homicidal ideation.  Family reports she has been experiencing some hallucinations.    Labs ordered and reviewed-- overall reassuring.  Depakote level WNL. Lithium level undetectable but appears she may not be taking this anymore based on chart review.  She is medically cleared.  Will get TTS consultation.  Remains voluntary at this time.  6:37 AM TTS has been done but no disposition given yet.  Care will be signed out to oncoming provider to follow-up on recommendations.  Final Clinical Impression(s) / ED  Diagnoses Final diagnoses:  History of bipolar disorder    Rx / DC Orders ED Discharge Orders     None         Larene Pickett, PA-C 07/29/21 [place], [place], DO 07/29/21 [phone]

## 2021-07-29 NOTE — ED Notes (Signed)
Pt is talking loudly to herself, rambling on about random things.

## 2021-07-30 DIAGNOSIS — F329 Major depressive disorder, single episode, unspecified: Secondary | ICD-10-CM | POA: Diagnosis not present

## 2021-07-30 NOTE — BH Assessment (Signed)
[facility] Assessment Progress Note   Pt has reportedly been tentatively accepted to [facility] by Dr Rodman Key, conditioned upon pt being placed under IVC.  This has been staffed with Sheran Fava, NP as well as EDP Dene Gentry, MD and it has been determined that pt meets criteria for IVC, which Farris Has has initiated.  IVC documents have been faxed to [facility], and at [facility]. Owens Shark confirms receipt.  She has since faxed Findings and Custody Order to this Probation officer and off duty [facility] has completed Return of Service.  IVC documents have been faxed to [facility], and at [facility] confirms receipt.  Dr Francia Greaves, Farris Has and pt's nurse, Darnelle Maffucci, have been notified, and Darnelle Maffucci agrees to call report to [phone].  Pt is to be transported via [facility].  [place] Coordinator [phone]

## 2021-07-30 NOTE — ED Provider Notes (Signed)
Emergency Medicine Observation Re-evaluation Note  Shelby Jordan is a 71 y.o. female, seen on rounds today at 0700.  Pt initially presented to the ED for complaints of Mental Health Evaluation Currently, the patient is resting comfortably.  Physical Exam  BP 132/81 (BP Location: Left Arm)    Pulse 80    Temp 98.2 F (36.8 C) (Oral)    Resp 18    Ht 5\' 4"  (1.626 m)    Wt 90.7 kg    SpO2 97%    BMI 34.33 kg/m  Physical Exam General: NAD   ED Course / MDM  EKG:EKG Interpretation  Date/Time:  Tuesday July 29 2021 00:08:36 EST Ventricular Rate:  103 PR Interval:  157 QRS Duration: 135 QT Interval:  370 QTC Calculation: 485 R Axis:   -24 Text Interpretation: Sinus tachycardia Right bundle branch block  similar to prior tracing Confirmed by Wynona Dove ([id number]) on 07/29/2021 5:32:20 AM  I have reviewed the labs performed to date as well as medications administered while in observation.  Recent changes in the last 24 hours include no acute events reported.  Plan  Current plan is for geri-psych placement.  Shelby Jordan is not under involuntary commitment.     Valarie Merino, MD 07/30/21 [phone]

## 2021-07-30 NOTE — ED Notes (Signed)
One bag of belongings returned to pt. Officer with pt, report to officers.  Pt from dpt with officers.

## 2021-07-30 NOTE — Progress Notes (Signed)
[facility] Placement  Pt meets inpatient criteria per Lindon Romp, NP. CSW sent secure chat to Dr. Louis Meckel, Do requesting to review referral at [facility] unit. Referral was sent to the following facilities;   Destination Service Provider Address Phone Fax  [place] [place] [address] [phone]  [facility]  [address] [place] [address] [phone] [facility]  [address] [place] [address] [phone] [place], [place] [address] [phone]  [facility]  [place], [place] [address] [phone]  [facility]  [address] [place] [address] [phone]  [facility]  [place], [place] [address] [phone]  [facility]  [address] [place] [address] [phone] [facility]  [address] [place] [address] [phone]  [facility]  [address] [place] [address] [phone] [place]  [address] [place] [address] [phone] [facility]  [address] [place] [address] [phone] [place]  [address], [place] [address] [phone] [facility]  [address], [place] [address] [phone] [facility]  [address] [place] [address] [phone]  [facility]  [address], [place] [address] [phone]  [facility]   [address], [place] [address] [phone]  [facility]  [address], [place] [address] [phone]    Situation ongoing,  CSW will follow up.   Benjaman Kindler, MSW, LCSWA 07/30/2021  @ 1:13 AM

## 2021-07-30 NOTE — ED Notes (Signed)
Patient is eating refreshments.

## 2021-07-30 NOTE — ED Notes (Signed)
Pt sitting in bed in hallway, pt calm and cooperative, pt denies si or hi at this time, pt denies pain.

## 2021-07-30 NOTE — ED Notes (Signed)
Patient is awake, talking to sitter and staff. Pleasant and cooperative.

## 2021-07-30 NOTE — ED Notes (Signed)
Patient went to shower in TCU

## 2021-07-30 NOTE — Progress Notes (Signed)
CSW received a phone call from [place] with [facility] offering a PENDING bed offer. Pt has a pending be offer with [facility] pending IVC status. Accepting provider Dr. Rodman Key. Pt will admit to the acute [facility] unit. Phone number for report [phone].  CSW sent a secure chat to nursing Andre Lefort, RN and Disposition counselor Jalene Mullet to follow up.  Benjaman Kindler, MSW, LCSWA 07/30/2021 1:35 AM

## 2021-07-30 NOTE — ED Notes (Signed)
Patient is up performing personal hygiene

## 2021-07-31 DIAGNOSIS — Z79899 Other long term (current) drug therapy: Secondary | ICD-10-CM | POA: Diagnosis not present

## 2021-08-01 DIAGNOSIS — I10 Essential (primary) hypertension: Secondary | ICD-10-CM | POA: Diagnosis not present

## 2021-08-01 DIAGNOSIS — R451 Restlessness and agitation: Secondary | ICD-10-CM | POA: Diagnosis not present

## 2021-08-02 DIAGNOSIS — R6889 Other general symptoms and signs: Secondary | ICD-10-CM | POA: Diagnosis not present

## 2021-08-02 DIAGNOSIS — I10 Essential (primary) hypertension: Secondary | ICD-10-CM | POA: Diagnosis not present

## 2021-08-02 DIAGNOSIS — Z79899 Other long term (current) drug therapy: Secondary | ICD-10-CM | POA: Diagnosis not present

## 2021-08-02 DIAGNOSIS — Z119 Encounter for screening for infectious and parasitic diseases, unspecified: Secondary | ICD-10-CM | POA: Diagnosis not present

## 2021-08-02 DIAGNOSIS — J42 Unspecified chronic bronchitis: Secondary | ICD-10-CM | POA: Diagnosis not present

## 2021-08-03 DIAGNOSIS — I10 Essential (primary) hypertension: Secondary | ICD-10-CM | POA: Diagnosis not present

## 2021-08-03 DIAGNOSIS — J42 Unspecified chronic bronchitis: Secondary | ICD-10-CM | POA: Diagnosis not present

## 2021-08-03 DIAGNOSIS — N390 Urinary tract infection, site not specified: Secondary | ICD-10-CM | POA: Diagnosis not present

## 2021-08-04 DIAGNOSIS — I10 Essential (primary) hypertension: Secondary | ICD-10-CM | POA: Diagnosis not present

## 2021-08-05 DIAGNOSIS — I10 Essential (primary) hypertension: Secondary | ICD-10-CM | POA: Diagnosis not present

## 2021-08-06 DIAGNOSIS — I10 Essential (primary) hypertension: Secondary | ICD-10-CM | POA: Diagnosis not present

## 2021-08-07 DIAGNOSIS — R059 Cough, unspecified: Secondary | ICD-10-CM | POA: Diagnosis not present

## 2021-08-07 DIAGNOSIS — R110 Nausea: Secondary | ICD-10-CM | POA: Diagnosis not present

## 2021-08-08 DIAGNOSIS — I10 Essential (primary) hypertension: Secondary | ICD-10-CM | POA: Diagnosis not present

## 2021-08-09 DIAGNOSIS — I10 Essential (primary) hypertension: Secondary | ICD-10-CM | POA: Diagnosis not present

## 2021-08-10 DIAGNOSIS — Z8709 Personal history of other diseases of the respiratory system: Secondary | ICD-10-CM | POA: Diagnosis not present

## 2021-08-10 DIAGNOSIS — I10 Essential (primary) hypertension: Secondary | ICD-10-CM | POA: Diagnosis not present

## 2021-08-10 DIAGNOSIS — J069 Acute upper respiratory infection, unspecified: Secondary | ICD-10-CM | POA: Diagnosis not present

## 2021-08-10 DIAGNOSIS — R059 Cough, unspecified: Secondary | ICD-10-CM | POA: Diagnosis not present

## 2021-08-11 DIAGNOSIS — I10 Essential (primary) hypertension: Secondary | ICD-10-CM | POA: Diagnosis not present

## 2021-08-11 DIAGNOSIS — E785 Hyperlipidemia, unspecified: Secondary | ICD-10-CM | POA: Diagnosis not present

## 2021-11-28 ENCOUNTER — Encounter: Payer: Self-pay | Admitting: Family

## 2021-11-28 ENCOUNTER — Ambulatory Visit (INDEPENDENT_AMBULATORY_CARE_PROVIDER_SITE_OTHER): Payer: Medicare Other | Admitting: Family

## 2021-11-28 VITALS — BP 123/68 | HR 76 | Temp 98.4°F | Resp 16 | Ht 64.0 in | Wt 208.1 lb

## 2021-11-28 DIAGNOSIS — E669 Obesity, unspecified: Secondary | ICD-10-CM | POA: Diagnosis not present

## 2021-11-28 DIAGNOSIS — Z5181 Encounter for therapeutic drug level monitoring: Secondary | ICD-10-CM | POA: Insufficient documentation

## 2021-11-28 DIAGNOSIS — J449 Chronic obstructive pulmonary disease, unspecified: Secondary | ICD-10-CM | POA: Diagnosis not present

## 2021-11-28 DIAGNOSIS — Z1211 Encounter for screening for malignant neoplasm of colon: Secondary | ICD-10-CM | POA: Insufficient documentation

## 2021-11-28 DIAGNOSIS — R1032 Left lower quadrant pain: Secondary | ICD-10-CM | POA: Insufficient documentation

## 2021-11-28 DIAGNOSIS — R739 Hyperglycemia, unspecified: Secondary | ICD-10-CM | POA: Diagnosis not present

## 2021-11-28 DIAGNOSIS — R5383 Other fatigue: Secondary | ICD-10-CM | POA: Diagnosis not present

## 2021-11-28 DIAGNOSIS — N1831 Chronic kidney disease, stage 3a: Secondary | ICD-10-CM | POA: Diagnosis not present

## 2021-11-28 DIAGNOSIS — F250 Schizoaffective disorder, bipolar type: Secondary | ICD-10-CM | POA: Diagnosis not present

## 2021-11-28 DIAGNOSIS — Z8619 Personal history of other infectious and parasitic diseases: Secondary | ICD-10-CM | POA: Diagnosis not present

## 2021-11-28 DIAGNOSIS — Z87891 Personal history of nicotine dependence: Secondary | ICD-10-CM | POA: Diagnosis not present

## 2021-11-28 DIAGNOSIS — N1832 Chronic kidney disease, stage 3b: Secondary | ICD-10-CM | POA: Insufficient documentation

## 2021-11-28 DIAGNOSIS — E782 Mixed hyperlipidemia: Secondary | ICD-10-CM | POA: Diagnosis not present

## 2021-11-28 DIAGNOSIS — R1904 Left lower quadrant abdominal swelling, mass and lump: Secondary | ICD-10-CM | POA: Insufficient documentation

## 2021-11-28 DIAGNOSIS — Z1231 Encounter for screening mammogram for malignant neoplasm of breast: Secondary | ICD-10-CM | POA: Insufficient documentation

## 2021-11-28 DIAGNOSIS — Z780 Asymptomatic menopausal state: Secondary | ICD-10-CM

## 2021-11-28 DIAGNOSIS — R109 Unspecified abdominal pain: Secondary | ICD-10-CM | POA: Diagnosis not present

## 2021-11-28 DIAGNOSIS — F3110 Bipolar disorder, current episode manic without psychotic features, unspecified: Secondary | ICD-10-CM

## 2021-11-28 LAB — LIPID PANEL
Cholesterol: 233 mg/dL — ABNORMAL HIGH (ref 0–200)
HDL: 71.5 mg/dL (ref >39.00)
LDL Cholesterol: 135 mg/dL — ABNORMAL HIGH (ref 0–99)
NonHDL: 161.78
Total CHOL/HDL Ratio: 3
Triglycerides: 133 mg/dL (ref 0.0–149.0)
VLDL: 26.6 mg/dL (ref 0.0–40.0)

## 2021-11-28 LAB — CBC WITH DIFFERENTIAL/PLATELET
Basophils Absolute: 0 10*3/uL (ref 0.0–0.1)
Basophils Relative: 0.8 % (ref 0.0–3.0)
Eosinophils Absolute: 0.1 10*3/uL (ref 0.0–0.7)
Eosinophils Relative: 1.5 % (ref 0.0–5.0)
HCT: 40.6 % (ref 36.0–46.0)
Hemoglobin: 13.5 g/dL (ref 12.0–15.0)
Lymphocytes Relative: 24.8 % (ref 12.0–46.0)
Lymphs Abs: 1.5 10*3/uL (ref 0.7–4.0)
MCHC: 33.3 g/dL (ref 30.0–36.0)
MCV: 89 fl (ref 78.0–100.0)
Monocytes Absolute: 0.5 10*3/uL (ref 0.1–1.0)
Monocytes Relative: 7.9 % (ref 3.0–12.0)
Neutro Abs: 3.9 10*3/uL (ref 1.4–7.7)
Neutrophils Relative %: 65 % (ref 43.0–77.0)
Platelets: 323 10*3/uL (ref 150.0–400.0)
RBC: 4.56 Mil/uL (ref 3.87–5.11)
RDW: 15.8 % — ABNORMAL HIGH (ref 11.5–15.5)
WBC: 6 10*3/uL (ref 4.0–10.5)

## 2021-11-28 LAB — COMPREHENSIVE METABOLIC PANEL
ALT: 25 U/L (ref 0–35)
AST: 18 U/L (ref 0–37)
Albumin: 4.7 g/dL (ref 3.5–5.2)
Alkaline Phosphatase: 41 U/L (ref 39–117)
BUN: 19 mg/dL (ref 6–23)
CO2: 31 mEq/L (ref 19–32)
Calcium: 10.2 mg/dL (ref 8.4–10.5)
Chloride: 102 mEq/L (ref 96–112)
Creatinine, Ser: 1.33 mg/dL — ABNORMAL HIGH (ref 0.40–1.20)
GFR: 40.57 mL/min — ABNORMAL LOW (ref >60.00)
Glucose, Bld: 133 mg/dL — ABNORMAL HIGH (ref 70–99)
Potassium: 4.8 mEq/L (ref 3.5–5.1)
Sodium: 139 mEq/L (ref 135–145)
Total Bilirubin: 0.6 mg/dL (ref 0.2–1.2)
Total Protein: 7.5 g/dL (ref 6.0–8.3)

## 2021-11-28 LAB — HEMOGLOBIN A1C: Hgb A1c MFr Bld: 6.3 % (ref 4.6–6.5)

## 2021-11-28 LAB — TSH: TSH: 1.5 u[IU]/mL (ref 0.35–5.50)

## 2021-11-28 MED ORDER — DIVALPROEX SODIUM 500 MG PO DR TAB: 500.0000 mg | DELAYED_RELEASE_TABLET | Freq: Two times a day (BID) | ORAL | Status: DC

## 2021-11-28 NOTE — Assessment & Plan Note (Signed)
Ordering hepatitis panel pending results ?Thought to have been treated in past , however unsure ?

## 2021-11-28 NOTE — Assessment & Plan Note (Signed)
Refer psychiatry for eval/treat ?Ongoing with depakote, will obtain levels today for drug monitoring. ?

## 2021-11-28 NOTE — Assessment & Plan Note (Signed)
Mammogram ordered. Pending results. 

## 2021-11-28 NOTE — Progress Notes (Signed)
? ?New Patient Office Visit ? ?Subjective:  ?Patient ID: Shelby Jordan, female    DOB: September 04, 1950  Age: 71 y.o. MRN: [id number] ? ?CC:  ?Chief Complaint  ?Patient presents with  ? Establish Care  ? ? ?HPI ?Shelby Jordan is here to establish care as a new patient. ? ?Prior provider was: has not had in some time.  ?Pt is without acute concerns.  ? ?chronic concerns: ? ?Biopolar/unsure of distinction/ possible schizophrenia: has h/o auditory and visual hallucinations, has been to ED in the past. Still taking depakote as well as lithium. Does not drink alcohol. Thinks she was started on zyprexa 15 mg back in January but only had thirty days of medication, has not established with psychiatry. Was on temazepam at one point, not currently with the medication. Only thing she is taking is depakote, taking 300 mg twice daily . Over the counter unisom for sleep prn, also takes benadryl If needed. Thinks supposed to be on lithium but not sure if she has any more or if she is to still be taking.  ? ?Off of lithium for at least a few months, prior to that was maybe on for thirty years.  ? ?Concerned with bump/lump on left lower side of stomach. Not painful when touching, only at times. Doesn't hurt with palpitation. No constipation no diarrhea. No nausea. No heartburn.  ? ?Past Medical History:  ?Diagnosis Date  ? Bipolar 1 disorder ([place])   ? Depression   ? Hepatitis C   ? High cholesterol   ? Hypertension   ? UTI (urinary tract infection)   ? ? ?Past Surgical History:  ?Procedure Laterality Date  ? APPENDECTOMY    ? TOE AMPUTATION    ? ? ?Family History  ?Problem Relation Age of Onset  ? Mental illness Mother   ?     born in mental hospital  ? ? ?Social History  ? ?Socioeconomic History  ? Marital status: Legally Separated  ?  Spouse name: Not on file  ? Number of children: Not on file  ? Years of education: Not on file  ? Highest education level: Not on file  ?Occupational History  ? Not on file  ?Tobacco Use  ? Smoking  status: Former  ?  Packs/day: 1.00  ?  Years: 30.00  ?  Pack years: 30.00  ?  Types: Cigarettes  ?  Quit date: 2023  ?  Years since quitting: 0.3  ? Smokeless tobacco: Never  ?Vaping Use  ? Vaping Use: Never used  ?Substance and Sexual Activity  ? Alcohol use: Never  ? Drug use: Never  ? Sexual activity: Not Currently  ?  Partners: Male  ?Other Topics Concern  ? Not on file  ?Social History Narrative  ? Not on file  ? ?Social Determinants of Health  ? ?Financial Resource Strain: Not on file  ?Food Insecurity: Not on file  ?Transportation Needs: Not on file  ?Physical Activity: Not on file  ?Stress: Not on file  ?Social Connections: Not on file  ?Intimate Partner Violence: Not on file  ? ? ?Outpatient Medications Prior to Visit  ?Medication Sig Dispense Refill  ? diphenhydrAMINE (BENADRYL) 25 mg capsule Take 1 capsule (25 mg total) by mouth at bedtime as needed for sleep. 30 capsule 1  ? doxylamine, Sleep, (UNISOM) 25 MG tablet Take 25 mg by mouth at bedtime.    ? divalproex (DEPAKOTE) 500 MG DR tablet Take 1 tablet (500 mg total) by mouth 3 (three) times  daily. Take 1 in the morning, and 2 at night (Patient taking differently: Take 500 mg by mouth 2 (two) times daily.) 60 tablet 1  ? lithium carbonate (LITHOBID) 300 MG CR tablet Take 1 tablet (300 mg total) by mouth at bedtime. 30 tablet 1  ? temazepam (RESTORIL) 30 MG capsule Take 1 capsule (30 mg total) by mouth at bedtime as needed for sleep. 30 capsule 1  ? ?No facility-administered medications prior to visit.  ? ? ?Allergies  ?Allergen Reactions  ? Tomato (Diagnostic) Other (See Comments)  ?  Tongue break out.   ? Citrus Rash  ? ? ?ROS ?Review of Systems  ?Constitutional:  Negative for chills and fatigue.  ?Respiratory:  Negative for cough and shortness of breath.   ?Cardiovascular:  Negative for chest pain and leg swelling.  ?Gastrointestinal:  Negative for abdominal pain, diarrhea and nausea.  ?     Abdominal mass x 2 years left outer quadrant    ?Genitourinary:  Negative for difficulty urinating, menstrual problem and vaginal bleeding.  ?Neurological:  Negative for dizziness and headaches.  ?Psychiatric/Behavioral:  Positive for behavioral problems (daughter accompanying states pt in and out of mania, otherwise manic depressive wanting to sleep all of the time and no motivation to do anything. doesn't groom one self at times), hallucinations (at times when in manic state with auditory and visual) and sleep disturbance (sleeps often, sometimes not getting out of the bed). Negative for agitation, self-injury and suicidal ideas.   ?All other systems reviewed and are negative. ? ? ? ?  ?Objective:  ?  ?Physical Exam ?Vitals reviewed.  ?Constitutional:   ?   General: She is not in acute distress. ?   Appearance: Normal appearance. She is obese. She is not ill-appearing, toxic-appearing or diaphoretic.  ?HENT:  ?   Right Ear: Tympanic membrane normal.  ?   Left Ear: Tympanic membrane normal.  ?   Mouth/Throat:  ?   Mouth: Mucous membranes are moist.  ?   Pharynx: No pharyngeal swelling.  ?   Tonsils: No tonsillar exudate.  ?Eyes:  ?   Extraocular Movements: Extraocular movements intact.  ?   Conjunctiva/sclera: Conjunctivae normal.  ?   Pupils: Pupils are equal, round, and reactive to light.  ?Neck:  ?   Thyroid: No thyroid mass.  ?Cardiovascular:  ?   Rate and Rhythm: Normal rate and regular rhythm.  ?Pulmonary:  ?   Effort: Pulmonary effort is normal.  ?   Breath sounds: Normal breath sounds.  ?Abdominal:  ?   General: Abdomen is flat. Bowel sounds are normal.  ?   Palpations: Abdomen is soft.  ?   Comments: Palpable nontender small approximately 2 cm diameter mass, suspected lymph node vs lipoma left lower outer quadrant abdomen, mass is fixed nonmobile  ?Musculoskeletal:     ?   General: Normal range of motion.  ?Lymphadenopathy:  ?   Cervical:  ?   Right cervical: No superficial cervical adenopathy. ?   Left cervical: No superficial cervical adenopathy.   ?Skin: ?   General: Skin is warm.  ?   Capillary Refill: Capillary refill takes less than 2 seconds.  ?Neurological:  ?   General: No focal deficit present.  ?   Mental Status: She is alert and oriented to person, place, and time. Mental status is at baseline.  ?Psychiatric:     ?   Attention and Perception: Attention normal.     ?   Mood and Affect: Mood  normal.     ?   Speech: Speech normal.     ?   Behavior: Behavior is withdrawn. Behavior is cooperative.     ?   Thought Content: Thought content normal. Thought content does not include homicidal or suicidal plan.     ?   Judgment: Judgment normal.  ? ? ? ? ?BP 123/68   Pulse 76   Temp 98.4 ?F (36.9 ?C)   Resp 16   Ht '5\' 4"'$  (1.626 m)   Wt 208 lb 2 oz (94.4 kg)   SpO2 98%   BMI 35.72 kg/m?  ?Wt Readings from Last 3 Encounters:  ?11/28/21 208 lb 2 oz (94.4 kg)  ?07/29/21 200 lb (90.7 kg)  ?04/29/21 147 lb 11.3 oz (67 kg)  ? ? ? ?Health Maintenance Due  ?Topic Date Due  ? TETANUS/TDAP  Never done  ? COLONOSCOPY (Pts 45-32yr Insurance coverage will need to be confirmed)  Never done  ? MAMMOGRAM  Never done  ? Zoster Vaccines- Shingrix (1 of 2) Never done  ? Pneumonia Vaccine 71 Years old (1 - PCV) Never done  ? DEXA SCAN  Never done  ? COVID-19 Vaccine (3 - Booster for Pfizer series) 12/26/2020  ? ? ?There are no preventive care reminders to display for this patient. ? ?Lab Results  ?Component Value Date  ? TSH 1.540 12/15/2019  ? ?Lab Results  ?Component Value Date  ? WBC 9.6 07/29/2021  ? HGB 14.3 07/29/2021  ? HCT 43.5 07/29/2021  ? MCV 89.7 07/29/2021  ? PLT 312 07/29/2021  ? ?Lab Results  ?Component Value Date  ? NA 140 07/29/2021  ? K 4.0 07/29/2021  ? CO2 26 07/29/2021  ? GLUCOSE 153 (H) 07/29/2021  ? BUN 22 07/29/2021  ? CREATININE 1.23 (H) 07/29/2021  ? BILITOT 0.5 07/29/2021  ? ALKPHOS 43 07/29/2021  ? AST 21 07/29/2021  ? ALT 24 07/29/2021  ? PROT 8.5 (H) 07/29/2021  ? ALBUMIN 4.9 07/29/2021  ? CALCIUM 10.2 07/29/2021  ? ANIONGAP 9 07/29/2021   ? ?No results found for: CHOL ?No results found for: HDL ?No results found for: LLeighton?No results found for: TRIG ?No results found for: CHOLHDL ?Lab Results  ?Component Value Date  ? HGBA1C 6.3 (H) 12/14/2019  ? ? ?  ?Assessment

## 2021-11-28 NOTE — Assessment & Plan Note (Signed)
a1c today pending results ?Maintain diabetic diet  ?

## 2021-11-28 NOTE — Assessment & Plan Note (Signed)
Work on diet and exercise as tolerated  ?

## 2021-11-28 NOTE — Assessment & Plan Note (Signed)
Pt refuses colonoscopy for now, prefers cologuard. (may consider colonoscopy in the future) ?No family history colon cancer to their knowledge. ?Pt denies abdominal symptoms to include diarrhea, constipation or blood in stool. ?cologuard order placed. ? ?

## 2021-11-28 NOTE — Patient Instructions (Addendum)
Welcome to our clinic, I am happy to have you as my new patient. I am excited to continue on this healthcare journey with you. ? ?Stop by the lab prior to leaving today. I will notify you of your results once received.  ? ?Please keep in mind ?Any my chart messages you send have p to a three business day turnaround for a response.  ?Phone calls may have up to a one day business turnaround for a  response.  ? ?If you need a medication refill I recommend you request it through the pharmacy as this is easiest for [place] rather than sending a message and or phone call.  ? ?Due to recent changes in healthcare laws, you may see results of your imaging and/or laboratory studies on MyChart before I have had a chance to review them.  I understand that in some cases there may be results that are confusing or concerning to you. Please understand that not all results are received at the same time and often I may need to interpret multiple results in order to provide you with the best plan of care or course of treatment. Therefore, I ask that you please give me 2 business days to thoroughly review all your results before contacting my office for clarification. Should we see a critical lab result, you will be contacted sooner.  ? ? ?A referral was placed today for psychiatry ?Please let us know if you have not heard back within 2 weeks about the referral. ? ?You have an appointment scheduled for: ? ?'[x]'$   3D Mammogram  ?'[x]'$   Bone Density  ? ?Your appointment will at the following location ? ?'[x]'$   [facility] ? [place] [address] ? [phone] ? ? ? ?Make sure to wear two piece  clothing  ?No lotions powders or deodorants the day of the appointment ?Make sure to bring picture ID and insurance card.  ?Bring list of medications you are currently taking including any supplements.  ? ? ?Please refer to this list, and call around to see which place may be best  suited for you.  ?Let me know which one you would like to go with, and if needed I will place a referral for you.  ?Please remember to ask them if they take your insurance. ?Another option is to call your insurance and inquire about available covered psychiatrists, and make appointment with one covered.  ? ?[facility] located at [address], [place], [place], [place].  [phone].    ?  ?[facility] located at [address], [place], [place]. [phone].  ?  ?[facility] located at [address], [place], [place].  [phone].  ?  ?[facility] located at [place], [place], [place].  [phone].  ?  ?[facility] located at [address], [place], [place], [place].  [phone].    ?  ?[facility] located at [place]. [address] [place], [place], [place].  [phone].    ?  ? ?[facility] local practices located at:  ?  ?[address], [place], [place].  [phone].  ?  ?[place], [place], [place].  ?  ?[address], [place], [place], [place].  [phone].  ?  ?[address], [place], [place], [place]. [phone].  ?  ?  ?[facility] [facility] located at [address] [place], [place], [place]. [phone].    ?  ? ?[facility] located at [address], [place], [place] [address]- [phone]?  ?  ?  ?[facility] located at [address] [place], [place] [address] [phone]?  ?  ?  ?[facility] located [place], [place], [place] [address] = [phone]?  ?  ?  ?[facility] located at [address] [place], [place] [address] = [phone]?  ?  ?  ?[facility] [place], [place], [place] [address] - [phone]  ? ?

## 2021-11-28 NOTE — Assessment & Plan Note (Signed)
Ordering ct pending results ?

## 2021-11-28 NOTE — Assessment & Plan Note (Signed)
Bone density ordered pending results.   

## 2021-11-28 NOTE — Assessment & Plan Note (Signed)
Work on low cholesterol diet, exercise as tolerated. Take medication as prescribed.  

## 2021-11-28 NOTE — Assessment & Plan Note (Signed)
Referral to lung screening. ?

## 2021-11-28 NOTE — Assessment & Plan Note (Signed)
+   30 year smoker ?Referral to lung screening ?

## 2021-11-28 NOTE — Assessment & Plan Note (Signed)
Monitoring level of depakote ?

## 2021-11-28 NOTE — Assessment & Plan Note (Signed)
Reviewed recent labs, stable.  ?Ordering cmp pending results ?

## 2021-11-28 NOTE — Assessment & Plan Note (Signed)
Ordering CT abd pending results ?Suspected lymph node vs lipoma ?Also ordered cologuard, pt refuses colonscopy ?

## 2021-11-28 NOTE — Assessment & Plan Note (Addendum)
Referral to psychiatrist ?Did let daughter know for future refills will need to psychiatry as I do not treat for bipolar. ?

## 2021-11-28 NOTE — Assessment & Plan Note (Signed)
Lab work to include cbc cmp tsh ?

## 2021-12-01 NOTE — Progress Notes (Signed)
Pending results of HCV RNA to see if active infection. ?Does have known h/o

## 2021-12-03 LAB — HCV RNA,QUANTITATIVE REAL TIME PCR
HCV Quantitative Log: <1.18 Log IU/mL
HCV RNA, PCR, QN: <15 IU/mL

## 2021-12-03 LAB — HEPATITIS PANEL, ACUTE
Hep A IgM: NONREACTIVE
Hep B C IgM: NONREACTIVE
Hepatitis B Surface Ag: NONREACTIVE
Hepatitis C Ab: REACTIVE — AB
SIGNAL TO CUT-OFF: >11 — ABNORMAL HIGH (ref <1.00)

## 2021-12-03 LAB — VALPROIC ACID LEVEL: Valproic Acid Lvl: <12.5 mg/L — ABNORMAL LOW (ref 50.0–100.0)

## 2021-12-04 NOTE — Progress Notes (Signed)
Hcv rna was negative Pt does not have active hepatitis C infection, only history of which we were aware of.

## 2021-12-08 ENCOUNTER — Telehealth: Payer: Self-pay

## 2021-12-08 NOTE — Telephone Encounter (Signed)
Pt came in the office and I gave her lab results.

## 2021-12-24 ENCOUNTER — Other Ambulatory Visit: Payer: Self-pay

## 2021-12-24 DIAGNOSIS — N1831 Chronic kidney disease, stage 3a: Secondary | ICD-10-CM

## 2021-12-24 DIAGNOSIS — R109 Unspecified abdominal pain: Secondary | ICD-10-CM | POA: Diagnosis not present

## 2021-12-26 LAB — URINE CULTURE
MICRO NUMBER:: 13495177
SPECIMEN QUALITY:: ADEQUATE

## 2021-12-29 NOTE — Progress Notes (Signed)
Urine was negative for UTI

## 2022-01-08 DIAGNOSIS — H5203 Hypermetropia, bilateral: Secondary | ICD-10-CM | POA: Diagnosis not present

## 2022-01-08 DIAGNOSIS — H25013 Cortical age-related cataract, bilateral: Secondary | ICD-10-CM | POA: Diagnosis not present

## 2022-01-08 DIAGNOSIS — H524 Presbyopia: Secondary | ICD-10-CM | POA: Diagnosis not present

## 2022-01-08 DIAGNOSIS — H52223 Regular astigmatism, bilateral: Secondary | ICD-10-CM | POA: Diagnosis not present

## 2022-01-08 DIAGNOSIS — H2513 Age-related nuclear cataract, bilateral: Secondary | ICD-10-CM | POA: Diagnosis not present

## 2022-01-12 DIAGNOSIS — H5213 Myopia, bilateral: Secondary | ICD-10-CM | POA: Diagnosis not present

## 2022-01-15 ENCOUNTER — Ambulatory Visit (INDEPENDENT_AMBULATORY_CARE_PROVIDER_SITE_OTHER): Payer: Medicare Other | Admitting: Podiatry

## 2022-01-15 DIAGNOSIS — M79675 Pain in left toe(s): Secondary | ICD-10-CM | POA: Diagnosis not present

## 2022-01-15 DIAGNOSIS — B351 Tinea unguium: Secondary | ICD-10-CM | POA: Diagnosis not present

## 2022-01-15 DIAGNOSIS — M79674 Pain in right toe(s): Secondary | ICD-10-CM | POA: Diagnosis not present

## 2022-01-22 NOTE — Progress Notes (Signed)
  Subjective:  Patient ID: Shelby Jordan, female    DOB: April 03, 1951,  MRN: [id number]  Chief Complaint  Patient presents with   Nail Problem    Nail trim    71 y.o. female returns for the above complaint.  Patient presents with thickened elongated dystrophic toenails x10 mild pain on palpation.  Hurts with ambulation patient would like to have them debrided down.  Objective:  There were no vitals filed for this visit. Podiatric Exam: Vascular: dorsalis pedis and posterior tibial pulses are palpable bilateral. Capillary return is immediate. Temperature gradient is WNL. Skin turgor WNL  Sensorium: Normal Semmes Weinstein monofilament test. Normal tactile sensation bilaterally. Nail Exam: Pt has thick disfigured discolored nails with subungual debris noted bilateral entire nail hallux through fifth toenails.  Pain on palpation to the nails. Ulcer Exam: There is no evidence of ulcer or pre-ulcerative changes or infection. Orthopedic Exam: Muscle tone and strength are WNL. No limitations in general ROM. No crepitus or effusions noted.  Skin: No Porokeratosis. No infection or ulcers    Assessment & Plan:   1. Pain due to onychomycosis of toenails of both feet     Patient was evaluated and treated and all questions answered.  Onychomycosis with pain  -Nails palliatively debrided as below. -Educated on self-care  Procedure: Nail Debridement Rationale: pain  Type of Debridement: manual, sharp debridement. Instrumentation: Nail nipper, rotary burr. Number of Nails: 10  Procedures and Treatment: Consent by patient was obtained for treatment procedures. The patient understood the discussion of treatment and procedures well. All questions were answered thoroughly reviewed. Debridement of mycotic and hypertrophic toenails, 1 through 5 bilateral and clearing of subungual debris. No ulceration, no infection noted.  Return Visit-Office Procedure: Patient instructed to return to the office for  a follow up visit 3 months for continued evaluation and treatment.  Boneta Lucks, DPM    No follow-ups on file.

## 2022-02-02 ENCOUNTER — Ambulatory Visit
Admission: RE | Admit: 2022-02-02 | Discharge: 2022-02-02 | Disposition: A | Discharge status: Home / Self Care | Payer: Medicare Other | Source: Ambulatory Visit | Attending: Family | Admitting: Family

## 2022-02-02 ENCOUNTER — Other Ambulatory Visit: Payer: Self-pay | Admitting: Family

## 2022-02-02 DIAGNOSIS — Z1231 Encounter for screening mammogram for malignant neoplasm of breast: Secondary | ICD-10-CM | POA: Insufficient documentation

## 2022-02-02 DIAGNOSIS — M85852 Other specified disorders of bone density and structure, left thigh: Secondary | ICD-10-CM | POA: Diagnosis not present

## 2022-02-02 DIAGNOSIS — Z780 Asymptomatic menopausal state: Secondary | ICD-10-CM | POA: Insufficient documentation

## 2022-02-02 DIAGNOSIS — H524 Presbyopia: Secondary | ICD-10-CM | POA: Diagnosis not present

## 2022-02-02 NOTE — Progress Notes (Signed)
Left hip with osteopenia.  Recommend daily vitamin d and calcium to help with bone strength. And work on weight bearing exercises as tolerated. We will repeat this bone density scan in two years.

## 2022-02-05 ENCOUNTER — Other Ambulatory Visit: Payer: Self-pay | Admitting: Family

## 2022-02-05 DIAGNOSIS — N63 Unspecified lump in unspecified breast: Secondary | ICD-10-CM

## 2022-02-05 DIAGNOSIS — R928 Other abnormal and inconclusive findings on diagnostic imaging of breast: Secondary | ICD-10-CM

## 2022-02-05 NOTE — Progress Notes (Signed)
Your mammogram was abnormal. They suggested you would need diagnostic mammogram and possible ultrasound of the right breast. If the breast center does not contact you in the next few days in regards to this please let me know.

## 2022-02-26 ENCOUNTER — Ambulatory Visit
Admission: RE | Admit: 2022-02-26 | Discharge: 2022-02-26 | Disposition: A | Discharge status: Home / Self Care | Payer: Medicare Other | Source: Ambulatory Visit | Attending: Family | Admitting: Family

## 2022-02-26 DIAGNOSIS — N63 Unspecified lump in unspecified breast: Secondary | ICD-10-CM

## 2022-02-26 DIAGNOSIS — R928 Other abnormal and inconclusive findings on diagnostic imaging of breast: Secondary | ICD-10-CM

## 2022-02-27 ENCOUNTER — Other Ambulatory Visit: Payer: Self-pay | Admitting: Family

## 2022-02-27 DIAGNOSIS — N63 Unspecified lump in unspecified breast: Secondary | ICD-10-CM

## 2022-02-27 DIAGNOSIS — R928 Other abnormal and inconclusive findings on diagnostic imaging of breast: Secondary | ICD-10-CM

## 2022-02-27 NOTE — Progress Notes (Signed)
duplicate

## 2022-02-27 NOTE — Progress Notes (Signed)
Biopsy recommended.  Please let me know if you do not hear back from the imaging center to get this set up in the next few days.

## 2022-03-02 ENCOUNTER — Encounter: Payer: Self-pay | Admitting: Family

## 2022-03-02 ENCOUNTER — Ambulatory Visit (INDEPENDENT_AMBULATORY_CARE_PROVIDER_SITE_OTHER): Payer: Medicare Other | Admitting: Family

## 2022-03-02 ENCOUNTER — Ambulatory Visit (HOSPITAL_COMMUNITY)
Admission: EM | Admit: 2022-03-02 | Discharge: 2022-03-02 | Disposition: A | Discharge status: Home / Self Care | Payer: Medicare Other | Attending: Psychiatry | Admitting: Psychiatry

## 2022-03-02 VITALS — BP 138/88 | HR 110 | Temp 98.3°F | Resp 16 | Ht 64.0 in | Wt 195.4 lb

## 2022-03-02 DIAGNOSIS — R413 Other amnesia: Secondary | ICD-10-CM

## 2022-03-02 DIAGNOSIS — R3915 Urgency of urination: Secondary | ICD-10-CM

## 2022-03-02 DIAGNOSIS — F250 Schizoaffective disorder, bipolar type: Secondary | ICD-10-CM

## 2022-03-02 DIAGNOSIS — R944 Abnormal results of kidney function studies: Secondary | ICD-10-CM

## 2022-03-02 DIAGNOSIS — F309 Manic episode, unspecified: Secondary | ICD-10-CM

## 2022-03-02 DIAGNOSIS — F319 Bipolar disorder, unspecified: Secondary | ICD-10-CM | POA: Insufficient documentation

## 2022-03-02 DIAGNOSIS — F99 Mental disorder, not otherwise specified: Secondary | ICD-10-CM

## 2022-03-02 DIAGNOSIS — Z720 Tobacco use: Secondary | ICD-10-CM

## 2022-03-02 DIAGNOSIS — R4589 Other symptoms and signs involving emotional state: Secondary | ICD-10-CM

## 2022-03-02 DIAGNOSIS — Z91148 Patient's other noncompliance with medication regimen for other reason: Secondary | ICD-10-CM | POA: Diagnosis not present

## 2022-03-02 DIAGNOSIS — F209 Schizophrenia, unspecified: Secondary | ICD-10-CM | POA: Diagnosis not present

## 2022-03-02 NOTE — ED Triage Notes (Signed)
Pt presents to [facility] accompanied by her daughter. Pt has a hx of Bipolar I disorder, and schizophrenia. Per pts daughter pt has been experiencing manic behavior. Pts daughter reports lack of sleep, walking around the home throughout the night, and issues with memory (leaving pots on the stove without water). Pts daughter states that she has been without her medication for a few months because she was dismissed from [facility] as a patient.Pt states she would like resources for home care. Pts daughter states she has to go back to work but can be contacted at [phone], her name is [employer].Pt denies SI/HI and AVH.

## 2022-03-02 NOTE — ED Notes (Addendum)
Daugter is stating that her mom needs to stay somewhere for a few days to get back on her medication. She showed pictures of her mom had put  a pot on the stove with nothing in it and wakes up in middle of night trying to leave. Her mom was observed where she needs assistance with walking she shuffles as she walks. She was given resources for her mom. Daughter stated she will IVC her

## 2022-03-02 NOTE — Patient Instructions (Addendum)
   Go over to [facility] for evaluation.  [phone]   [address], [place], [place] [address]   They have an urgent care open for 24 hours.

## 2022-03-02 NOTE — ED Provider Notes (Signed)
Behavioral Health Urgent Care Medical Screening Exam  Patient Name: Shelby Jordan MRN: [id number] Date of Evaluation: 03/02/22 Chief Complaint:   Diagnosis:  Final diagnoses:  Noncompliance with medication regimen  Anxious appearance    History of Present illness: Shelby Jordan is a 71 y.o. female. With a history of bipolar 1 disorder and schizophrenia.  Patient was dropped off by her daughter, per the patient my daughter think I was disorganized was not keeping the train of thoughts..  Per the patient she was at [facility] and was discharged from [facility].  Collaborate: Spoke with patient's daughter, [name] [phone]), according to patient's daughter her mom was cooking when no one is at home, she has been off most of her medicines for a while.  Per the daughter she just need her to find a place to stay for a couple of days until she can get her to see someone.  Daughter is asking for resources.  But also state it is our responsibility to keep her mom.  NP discussed with patient and daughter that we can provide her with some outpatient resources however she will have to make the contacts.   Face-to-face observation of patient, patient is alert and oriented x 4, speech is clear, mood is labile affect congruent with mood.  Patient denies SI, patient stated that the only thing God do not forgive you for, HI, AVH.  Patient denies smoking or alcohol use.   Discharge home with daughter   Psychiatric Specialty Exam  Presentation  General Appearance:Casual  Eye Contact:Good  Speech:Clear and Coherent  Speech Volume:Normal  Handedness:Ambidextrous   Mood and Affect  Mood:Anxious  Affect:Appropriate   Thought Process  Thought Processes:Coherent  Descriptions of Associations:Circumstantial  Orientation:Full (Time, Place and Person)  Thought Content:WDL  Diagnosis of Schizophrenia or Schizoaffective disorder in past: Yes  Duration of Psychotic Symptoms: Greater  than six months  Hallucinations:None voices aliens  Ideas of Reference:None  Suicidal Thoughts:No  Homicidal Thoughts:No   [place]  Insight:Fair   Executive Functions  Concentration:Fair  Attention Span:Fair  [place]   Psychomotor Activity  Psychomotor Activity:Normal   Assets  Assets:Desire for Improvement; Housing   Sleep  Sleep:Fair  Number of hours: No data recorded  Nutritional Assessment (For OBS and FBC admissions only) Has the patient had a weight loss or gain of 10 pounds or more in the last 3 months?: No Has the patient had a decrease in food intake/or appetite?: No Does the patient have dental problems?: No Does the patient have eating habits or behaviors that may be indicators of an eating disorder including binging or inducing vomiting?: No Has the patient recently lost weight without trying?: 0 Has the patient been eating poorly because of a decreased appetite?: 0 Malnutrition Screening Tool Score: 0    Physical Exam: Physical Exam HENT:     Head: Normocephalic.     Nose: Nose normal.  Cardiovascular:     Rate and Rhythm: Normal rate.  Pulmonary:     Effort: Pulmonary effort is normal.  Musculoskeletal:        General: Normal range of motion.     Cervical back: Normal range of motion.  Skin:    General: Skin is warm.  Neurological:     General: No focal deficit present.     Mental Status: She is alert.  Psychiatric:        Mood and Affect: Mood normal.  Behavior: Behavior normal.        Thought Content: Thought content normal.        Judgment: Judgment normal.    Review of Systems  Constitutional: Negative.   HENT: Negative.    Eyes: Negative.   Respiratory: Negative.    Cardiovascular: Negative.   Gastrointestinal: Negative.   Genitourinary: Negative.   Musculoskeletal: Negative.   Skin: Negative.   Neurological: Negative.    Endo/Heme/Allergies: Negative.   Psychiatric/Behavioral:  The patient is nervous/anxious.    Blood pressure (!) 151/76, pulse 84, resp. rate 18, SpO2 100 %. There is no height or weight on file to calculate BMI.  Musculoskeletal: Strength & Muscle Tone: within normal limits Gait & Station: normal Patient leans: N/A   [place] MSE Discharge Disposition for Follow up and Recommendations: Based on my evaluation the patient does not appear to have an emergency medical condition and can be discharged with resources and follow up care in outpatient services for Medication Management and Individual Therapy   Evette Georges, NP 03/02/2022, 11:09 PM

## 2022-03-02 NOTE — Progress Notes (Signed)
[facility] entered resources into AVS  [place] [facility] [place]

## 2022-03-02 NOTE — Discharge Instructions (Addendum)
Home Care Resources  1.) [facility] [address] [place], [place] [address] [phone]  2.) [facility] [place], [place] [address] [phone]  3.) [facility] [address]  [place], [place] [address] [phone]  4.) [facility] [place], [place] [address] [phone]  5.) [facility] [address] [place], [place] [address] [phone]  6.) [facility] for [facility] [address] [place], [place] [address] [phone]

## 2022-03-02 NOTE — Progress Notes (Unsigned)
Established Patient Office Visit  Subjective:  Patient ID: Shelby Jordan, female    DOB: 03/25/1951  Age: 71 y.o. MRN: [id number]  CC:  Chief Complaint  Patient presents with   Chronic Kidney Disease   Insomnia    1 or 2 hours here and there    Fall    HPI Shelby Jordan is here today with concerns.   Urinary sx, doesn't feel any dysuria. Some increased urinary urgency and or frequency.   She is having trouble trouble walking, has been for some time a few months ago. Was having pain left muscle of lower back/hip at times. Uses cane to help her with balance.   She has had increased falls.  Daughter has been falling a lot more often.   Has been burning stuff in the kitchen, forgetting   unable to recall year, 2023.  States today is august 13th.  Today is Monday.  Current president: Edmon Crape  Season: summer.   Weight loss Wt Readings from Last 3 Encounters:  03/02/22 195 lb 6 oz (88.6 kg)  11/28/21 208 lb 2 oz (94.4 kg)  07/29/21 200 lb (90.7 kg)   Bipolar schizoaffective: depakote, pt only takes on and off. She was recently let go from her psychiatrist as she missed the last two visits. She used to be on another medication from her psychiatrist however daughter    Having trouble sleeping only sleeps 2-3 hours   Daughter states has full conversations with other people and or the tv   She sleeps only during the day but then doesn't sleep at night.      03/02/2022    2:27 PM  MMSE - Mini Mental State Exam  Orientation to time 4      Mini-Cog - 03/02/22 1446     Normal clock drawing test? no    How many words correct? 1                Past Medical History:  Diagnosis Date   Bipolar 1 disorder (HCC)    Depression    Hepatitis C    High cholesterol    Hypertension    UTI (urinary tract infection)     Past Surgical History:  Procedure Laterality Date   APPENDECTOMY     BREAST BIOPSY Right 03/04/2022   stereo bx, mass "X" clip-path pending    TOE AMPUTATION      Family History  Problem Relation Age of Onset   Mental illness Mother        born in mental hospital   Breast cancer Neg Hx     Social History   Socioeconomic History   Marital status: Legally Separated    Spouse name: Not on file   Number of children: Not on file   Years of education: Not on file   Highest education level: Not on file  Occupational History   Not on file  Tobacco Use   Smoking status: Former    Packs/day: 1.00    Years: 30.00    Total pack years: 30.00    Types: Cigarettes    Quit date: 2023    Years since quitting: 0.6   Smokeless tobacco: Never  Vaping Use   Vaping Use: Never used  Substance and Sexual Activity   Alcohol use: Never   Drug use: Never   Sexual activity: Not Currently    Partners: Male  Other Topics Concern   Not on file  Social History Narrative   Not  on file   Social Determinants of Health   Financial Resource Strain: Not on file  Food Insecurity: Not on file  Transportation Needs: Not on file  Physical Activity: Not on file  Stress: Not on file  Social Connections: Not on file  Intimate Partner Violence: Not on file    Outpatient Medications Prior to Visit  Medication Sig Dispense Refill   diphenhydrAMINE (BENADRYL) 25 mg capsule Take 1 capsule (25 mg total) by mouth at bedtime as needed for sleep. 30 capsule 1   divalproex (DEPAKOTE) 500 MG DR tablet Take 1 tablet (500 mg total) by mouth 2 (two) times daily.     doxylamine, Sleep, (UNISOM) 25 MG tablet Take 25 mg by mouth at bedtime.     No facility-administered medications prior to visit.    Allergies  Allergen Reactions   Tomato (Diagnostic) Other (See Comments)    Tongue break out.    Citrus Rash        Objective:    Physical Exam Constitutional:      General: She is not in acute distress.    Appearance: Normal appearance. She is obese. She is not ill-appearing, toxic-appearing or diaphoretic.  Cardiovascular:     Rate and Rhythm:  Normal rate and regular rhythm.  Pulmonary:     Effort: Pulmonary effort is normal.     Breath sounds: Normal breath sounds.  Neurological:     General: No focal deficit present.     Mental Status: She is alert and oriented to person, place, and time. Mental status is at baseline.     Cranial Nerves: No cranial nerve deficit.     Sensory: No sensory deficit.     Motor: No weakness.     Gait: Gait normal.  Psychiatric:        Attention and Perception: Attention normal.        Mood and Affect: Mood normal.        Speech: Speech is rapid and pressured.        Behavior: Behavior normal. Behavior is cooperative.        Thought Content: Thought content normal.        Cognition and Memory: She exhibits impaired recent memory.        Judgment: Judgment normal.     BP 138/88   Pulse (!) 110   Temp 98.3 F (36.8 C)   Resp 16   Ht '5\' 4"'$  (1.626 m)   Wt 195 lb 6 oz (88.6 kg)   SpO2 97%   BMI 33.54 kg/m  Wt Readings from Last 3 Encounters:  03/02/22 195 lb 6 oz (88.6 kg)  11/28/21 208 lb 2 oz (94.4 kg)  07/29/21 200 lb (90.7 kg)     Health Maintenance Due  Topic Date Due   Zoster Vaccines- Shingrix (1 of 2) Never done   COLONOSCOPY (Pts 45-75yr Insurance coverage will need to be confirmed)  Never done   Pneumonia Vaccine 71 Years old (1 - PCV) Never done   COVID-19 Vaccine (3 - Pfizer risk series) 11/28/2020   INFLUENZA VACCINE  02/17/2022    There are no preventive care reminders to display for this patient.  Lab Results  Component Value Date   TSH 1.09 03/02/2022   Lab Results  Component Value Date   WBC 7.3 03/02/2022   HGB 13.0 03/02/2022   HCT 39.6 03/02/2022   MCV 89.6 03/02/2022   PLT 272.0 03/02/2022   Lab Results  Component Value Date  NA 140 03/02/2022   K 4.6 03/02/2022   CO2 28 03/02/2022   GLUCOSE 140 (H) 03/02/2022   BUN 24 (H) 03/02/2022   CREATININE 1.68 (H) 03/02/2022   BILITOT 0.6 03/02/2022   ALKPHOS 33 (L) 03/02/2022   AST 28 03/02/2022    ALT 35 03/02/2022   PROT 7.3 03/02/2022   ALBUMIN 4.8 03/02/2022   CALCIUM 10.6 (H) 03/02/2022   ANIONGAP 9 07/29/2021   GFR 30.59 (L) 03/02/2022   Lab Results  Component Value Date   HGBA1C 6.3 11/28/2021      Assessment & Plan:   Problem List Items Addressed This Visit       Other   Schizoaffective disorder, bipolar type [facility])   Relevant Orders   Ambulatory referral to Neurology   Abnormal MMSE   Relevant Orders   Ambulatory referral to Neurology   Memory loss    mmse and mini cog abn  Concern for possible cognitive impairment  Referral placed for neuropsych More concerned for cognitive decline however pt daugther with her and bringing her to urgent care psychiatry as pt also a bit manic with impulsivity.       Relevant Orders   [name] and Folate Panel (Completed)   CBC (Completed)   Ambulatory referral to Neurology   Mania ([place])   Relevant Orders   TSH (Completed)   T4, Free (Completed)   Ambulatory referral to Neurology   Tobacco use   Relevant Orders   Ambulatory Referral Lung Cancer Screening [facility]   Urinary urgency    Urine culture ordered pending results      Relevant Orders   Urine Culture   Decreased GFR - Primary   Relevant Orders   Comprehensive metabolic panel (Completed)    No orders of the defined types were placed in this encounter.   Follow-up: Return in about 1 month (around 04/02/2022) for regular follow up appt.    Eugenia Pancoast, FNP

## 2022-03-03 LAB — COMPREHENSIVE METABOLIC PANEL
ALT: 35 U/L (ref 0–35)
AST: 28 U/L (ref 0–37)
Albumin: 4.8 g/dL (ref 3.5–5.2)
Alkaline Phosphatase: 33 U/L — ABNORMAL LOW (ref 39–117)
BUN: 24 mg/dL — ABNORMAL HIGH (ref 6–23)
CO2: 28 mEq/L (ref 19–32)
Calcium: 10.6 mg/dL — ABNORMAL HIGH (ref 8.4–10.5)
Chloride: 105 mEq/L (ref 96–112)
Creatinine, Ser: 1.68 mg/dL — ABNORMAL HIGH (ref 0.40–1.20)
GFR: 30.59 mL/min — ABNORMAL LOW (ref >60.00)
Glucose, Bld: 140 mg/dL — ABNORMAL HIGH (ref 70–99)
Potassium: 4.6 mEq/L (ref 3.5–5.1)
Sodium: 140 mEq/L (ref 135–145)
Total Bilirubin: 0.6 mg/dL (ref 0.2–1.2)
Total Protein: 7.3 g/dL (ref 6.0–8.3)

## 2022-03-03 LAB — TSH: TSH: 1.09 u[IU]/mL (ref 0.35–5.50)

## 2022-03-03 LAB — CBC
HCT: 39.6 % (ref 36.0–46.0)
Hemoglobin: 13 g/dL (ref 12.0–15.0)
MCHC: 32.8 g/dL (ref 30.0–36.0)
MCV: 89.6 fl (ref 78.0–100.0)
Platelets: 272 10*3/uL (ref 150.0–400.0)
RBC: 4.42 Mil/uL (ref 3.87–5.11)
RDW: 17.6 % — ABNORMAL HIGH (ref 11.5–15.5)
WBC: 7.3 10*3/uL (ref 4.0–10.5)

## 2022-03-03 LAB — B12 AND FOLATE PANEL
Folate: 21.5 ng/mL (ref >5.9)
Vitamin B-12: >1500 pg/mL — ABNORMAL HIGH (ref 211–911)

## 2022-03-03 LAB — T4, FREE: Free T4: 0.97 ng/dL (ref 0.60–1.60)

## 2022-03-04 ENCOUNTER — Other Ambulatory Visit: Payer: Self-pay | Admitting: Family

## 2022-03-04 ENCOUNTER — Ambulatory Visit
Admission: RE | Admit: 2022-03-04 | Discharge: 2022-03-04 | Disposition: A | Discharge status: Home / Self Care | Payer: Medicare Other | Source: Ambulatory Visit | Attending: Family | Admitting: Family

## 2022-03-04 DIAGNOSIS — N63 Unspecified lump in unspecified breast: Secondary | ICD-10-CM | POA: Insufficient documentation

## 2022-03-04 DIAGNOSIS — R928 Other abnormal and inconclusive findings on diagnostic imaging of breast: Secondary | ICD-10-CM | POA: Insufficient documentation

## 2022-03-04 DIAGNOSIS — N6081 Other benign mammary dysplasias of right breast: Secondary | ICD-10-CM | POA: Diagnosis not present

## 2022-03-04 HISTORY — PX: BREAST BIOPSY: SHX20

## 2022-03-04 NOTE — Progress Notes (Signed)
Noted pending biopsy pathology

## 2022-03-04 NOTE — Progress Notes (Signed)
Noted pending pathology.

## 2022-03-04 NOTE — Progress Notes (Signed)
[name] is much too high. Is she taking a b12 supplement?  Thyroid is ok.  Kidneys are stressed a bit from last visit.   Have pt come in to leave a urine culture she might have a UTI.  Also let's order a vitamin D.   Shelby Jordan CAN WE ADD VITAMIN D?

## 2022-03-05 ENCOUNTER — Other Ambulatory Visit (INDEPENDENT_AMBULATORY_CARE_PROVIDER_SITE_OTHER): Payer: Medicare Other

## 2022-03-05 DIAGNOSIS — R944 Abnormal results of kidney function studies: Secondary | ICD-10-CM | POA: Insufficient documentation

## 2022-03-05 DIAGNOSIS — Z720 Tobacco use: Secondary | ICD-10-CM | POA: Insufficient documentation

## 2022-03-05 DIAGNOSIS — F99 Mental disorder, not otherwise specified: Secondary | ICD-10-CM | POA: Insufficient documentation

## 2022-03-05 DIAGNOSIS — R3915 Urgency of urination: Secondary | ICD-10-CM | POA: Insufficient documentation

## 2022-03-05 DIAGNOSIS — R413 Other amnesia: Secondary | ICD-10-CM | POA: Insufficient documentation

## 2022-03-05 DIAGNOSIS — F309 Manic episode, unspecified: Secondary | ICD-10-CM | POA: Insufficient documentation

## 2022-03-05 LAB — VITAMIN D 25 HYDROXY (VIT D DEFICIENCY, FRACTURES): VITD: 29.3 ng/mL — ABNORMAL LOW (ref 30.00–100.00)

## 2022-03-05 LAB — SURGICAL PATHOLOGY

## 2022-03-05 NOTE — Assessment & Plan Note (Addendum)
mmse and mini cog abn  Concern for possible cognitive impairment  Referral placed for neuropsych More concerned for cognitive decline however pt daugther with her and bringing her to urgent care psychiatry as pt also a bit manic with impulsivity.

## 2022-03-05 NOTE — Assessment & Plan Note (Signed)
Urine culture ordered pending results

## 2022-03-05 NOTE — Progress Notes (Signed)
Noted  

## 2022-03-05 NOTE — Progress Notes (Signed)
Start vitamin D3 2000 IU once daily

## 2022-03-06 NOTE — Progress Notes (Signed)
noted 

## 2022-03-09 ENCOUNTER — Other Ambulatory Visit: Payer: Self-pay | Admitting: *Deleted

## 2022-03-09 DIAGNOSIS — Z87891 Personal history of nicotine dependence: Secondary | ICD-10-CM

## 2022-03-09 DIAGNOSIS — Z122 Encounter for screening for malignant neoplasm of respiratory organs: Secondary | ICD-10-CM

## 2022-03-10 ENCOUNTER — Other Ambulatory Visit: Payer: Medicare Other

## 2022-03-10 DIAGNOSIS — R3915 Urgency of urination: Secondary | ICD-10-CM

## 2022-03-11 LAB — URINE CULTURE
MICRO NUMBER:: 13814218
SPECIMEN QUALITY:: ADEQUATE

## 2022-03-16 NOTE — Progress Notes (Signed)
Urine negative for UTI.

## 2022-04-01 ENCOUNTER — Ambulatory Visit (INDEPENDENT_AMBULATORY_CARE_PROVIDER_SITE_OTHER): Payer: Medicare Other | Admitting: Acute Care

## 2022-04-01 ENCOUNTER — Telehealth: Payer: Self-pay | Admitting: Acute Care

## 2022-04-01 ENCOUNTER — Encounter: Payer: Self-pay | Admitting: Acute Care

## 2022-04-01 DIAGNOSIS — Z87891 Personal history of nicotine dependence: Secondary | ICD-10-CM

## 2022-04-01 NOTE — Progress Notes (Deleted)
Virtual Visit via Telephone Note  I connected with Shelby Jordan on 04/01/22 at 12:00 PM EDT by telephone and verified that I am speaking with the correct person using two identifiers.  Location: Patient:  At home Provider:  [place], [place], [place], [address]    I discussed the limitations, risks, security and privacy concerns of performing an evaluation and management service by telephone and the availability of in person appointments. I also discussed with the patient that there may be a patient responsible charge related to this service. The patient expressed understanding and agreed to proceed.    Shared Decision Making Visit Lung Cancer Screening Program [phone])   Eligibility: Age 71 y.o. Pack Years Smoking History Calculation 47 pack year smoking history (# packs/per year x # years smoked) Recent History of coughing up blood  no Unexplained weight loss? no ( >Than 15 pounds within the last 6 months ) Prior History Lung / other cancer no (Diagnosis within the last 5 years already requiring surveillance chest CT Scans). Smoking Status Former Smoker Former Smokers: Years since quit: < 1 year  Quit Date: 09/2021  Visit Components: Discussion included one or more decision making aids. yes Discussion included risk/benefits of screening. yes Discussion included potential follow up diagnostic testing for abnormal scans. yes Discussion included meaning and risk of over diagnosis. yes Discussion included meaning and risk of False Positives. yes Discussion included meaning of total radiation exposure. yes  Counseling Included: Importance of adherence to annual lung cancer LDCT screening. yes Impact of comorbidities on ability to participate in the program. yes Ability and willingness to under diagnostic treatment. yes  Smoking Cessation Counseling: Current Smokers:  Discussed importance of smoking cessation. yes Information about tobacco cessation classes and  interventions provided to patient. yes Patient provided with "ticket" for LDCT Scan. yes Symptomatic Patient. no  Counseling NA Diagnosis Code: Tobacco Use Z72.0 Asymptomatic Patient yes  Counseling (Intermediate counseling: > three minutes counseling) N0272 Former Smokers:  Discussed the importance of maintaining cigarette abstinence. yes Diagnosis Code: Personal History of Nicotine Dependence. Z36.644 Information about tobacco cessation classes and interventions provided to patient. Yes Patient provided with "ticket" for LDCT Scan. yes Written Order for Lung Cancer Screening with LDCT placed in Epic. Yes (CT Chest Lung Cancer Screening Low Dose W/O CM) [id number] Z12.2-Screening of respiratory organs Z87.891-Personal history of nicotine dependence  I spent 25 minutes of face to face time/virtual visit time  with  Shelby Jordan discussing the risks and benefits of lung cancer screening. We took the time to pause the power point at intervals to allow for questions to be asked and answered to ensure understanding. We discussed that she had taken the single most powerful action possible to decrease her risk of developing lung cancer when she quit smoking. I counseled her to remain smoke free, and to contact me if she ever had the desire to smoke again so that I can provide resources and tools to help support the effort to remain smoke free. We discussed the time and location of the scan, and that either  Doroteo Glassman RN, Joella Prince, RN or I  or I will call / send a letter with the results within  24-72 hours of receiving them. She has the office contact information in the event she needs to speak with me,  she verbalized understanding of all of the above and had no further questions upon leaving the office.     I explained to the patient that there  has been a high incidence of coronary artery disease noted on these exams. I explained that this is a non-gated exam therefore degree or severity cannot  be determined. This patient is not on statin therapy. I have asked the patient to follow-up with their PCP regarding any incidental finding of coronary artery disease and management with diet or medication as they feel is clinically indicated. The patient verbalized understanding of the above and had no further questions.     Shelby Spatz, NP 04/01/2022

## 2022-04-01 NOTE — Patient Instructions (Signed)
Thank you for participating in the [facility] Screening Program. It was our pleasure to meet you today. We will call you with the results of your scan within the next few days. Your scan will be assigned a Lung RADS category score by the physicians reading the scans.  This Lung RADS score determines follow up scanning.  See below for description of categories, and follow up screening recommendations. We will be in touch to schedule your follow up screening annually or based on recommendations of our providers. We will fax a copy of your scan results to your Primary Care Physician, or the physician who referred you to the program, to ensure they have the results. Please call the office if you have any questions or concerns regarding your scanning experience or results.  Our office number is [phone]. Please speak with Denise Phelps, RN. , or  Denise Buckner RN, They are  our Lung Cancer Screening RN.'s If They are unavailable when you call, Please leave a message on the voice mail. We will return your call at our earliest convenience.This voice mail is monitored several times a day.  Remember, if your scan is normal, we will scan you annually as long as you continue to meet the criteria for the program. (Age 55-77, Current smoker or smoker who has quit within the last 15 years). If you are a smoker, remember, quitting is the single most powerful action that you can take to decrease your risk of lung cancer and other pulmonary, breathing related problems. We know quitting is hard, and we are here to help.  Please let us know if there is anything we can do to help you meet your goal of quitting. If you are a former smoker, congratulations. We are proud of you! Remain smoke free! Remember you can refer friends or family members through the number above.  We will screen them to make sure they meet criteria for the program. Thank you for helping us take better care of you by  participating in Lung Screening.  You can receive free nicotine replacement therapy ( patches, gum or mints) by calling 1-800-QUIT NOW. Please call so we can get you on the path to becoming  a non-smoker. I know it is hard, but you can do this!  Lung RADS Categories:  Lung RADS 1: no nodules or definitely non-concerning nodules.  Recommendation is for a repeat annual scan in 12 months.  Lung RADS 2:  nodules that are non-concerning in appearance and behavior with a very low likelihood of becoming an active cancer. Recommendation is for a repeat annual scan in 12 months.  Lung RADS 3: nodules that are probably non-concerning , includes nodules with a low likelihood of becoming an active cancer.  Recommendation is for a 6-month repeat screening scan. Often noted after an upper respiratory illness. We will be in touch to make sure you have no questions, and to schedule your 6-month scan.  Lung RADS 4 A: nodules with concerning findings, recommendation is most often for a follow up scan in 3 months or additional testing based on our provider's assessment of the scan. We will be in touch to make sure you have no questions and to schedule the recommended 3 month follow up scan.  Lung RADS 4 B:  indicates findings that are concerning. We will be in touch with you to schedule additional diagnostic testing based on our provider's  assessment of the scan.  Other options for assistance in smoking cessation (   As covered by your insurance benefits)  Hypnosis for smoking cessation  [employer] [phone]  Acupuncture for smoking cessation  [facility] [phone]   

## 2022-04-01 NOTE — Telephone Encounter (Signed)
CT cancelled and visit NS.

## 2022-04-01 NOTE — Telephone Encounter (Signed)
Pt. Was a no show for her 1200 Shared decision making visit. I called her 3 times and left messages with the contact number twice of the three times asking her to call the office so that we could get the visit done. She never returned the call. We will need to cancel the scan scheduled for 9/14, as she is required to have the shared decision making visit prior to her first scan.

## 2022-04-02 ENCOUNTER — Ambulatory Visit: Payer: Medicare Other

## 2022-04-06 ENCOUNTER — Ambulatory Visit (INDEPENDENT_AMBULATORY_CARE_PROVIDER_SITE_OTHER): Payer: Medicare Other | Admitting: Family

## 2022-04-06 ENCOUNTER — Encounter: Payer: Self-pay | Admitting: Family

## 2022-04-06 VITALS — BP 139/85 | HR 80 | Temp 98.6°F | Resp 16 | Ht 64.0 in | Wt 190.4 lb

## 2022-04-06 DIAGNOSIS — F99 Mental disorder, not otherwise specified: Secondary | ICD-10-CM

## 2022-04-06 DIAGNOSIS — R413 Other amnesia: Secondary | ICD-10-CM

## 2022-04-06 DIAGNOSIS — R944 Abnormal results of kidney function studies: Secondary | ICD-10-CM

## 2022-04-06 NOTE — Patient Instructions (Addendum)
A referral was placed today for neurology.  Please let us know if you have not heard back within 2 weeks about the referral.  A referral was placed today for community care management.  Please let us know if you have not heard back within 2 weeks about the referral.   Please call to schedule ultrasound kidneys at the following location: the ultrasound has already been ordered.   [facility] outpatient imaging center [address], [place] [address] Phone [phone]-  8-5 pm   Regards,   Eugenia Pancoast FNP-C

## 2022-04-06 NOTE — Progress Notes (Unsigned)
Established Patient Office Visit  Subjective:  Patient ID: Shelby Jordan, female    DOB: 1951/06/14  Age: 71 y.o. MRN: [id number]  CC:  Chief Complaint  Patient presents with  . Chronic Kidney Disease    HPI Shelby Jordan is here today for follow up accompanied by daughter.    Declined EGFR last visit, one month ago.  Liver function was stable, does have h/o hepatitis C about twelve years ago.   Pt denies back pain, pt denies dysuria.   Vitamin d def: taking daily d3 since last visit. Also taking calcium and a MVI  Past Medical History:  Diagnosis Date  . Bipolar 1 disorder ([place])   . Depression   . Hepatitis C   . High cholesterol   . Hypertension   . UTI (urinary tract infection)     Past Surgical History:  Procedure Laterality Date  . APPENDECTOMY    . BREAST BIOPSY Right 03/04/2022   stereo bx, mass "X" clip-path pending  . TOE AMPUTATION      Family History  Problem Relation Age of Onset  . Mental illness Mother        born in mental hospital  . Breast cancer Neg Hx     Social History   Socioeconomic History  . Marital status: Legally Separated    Spouse name: Not on file  . Number of children: Not on file  . Years of education: Not on file  . Highest education level: Not on file  Occupational History  . Not on file  Tobacco Use  . Smoking status: Former    Packs/day: 1.00    Years: 30.00    Total pack years: 30.00    Types: Cigarettes    Quit date: 2023    Years since quitting: 0.7  . Smokeless tobacco: Never  Vaping Use  . Vaping Use: Never used  Substance and Sexual Activity  . Alcohol use: Never  . Drug use: Never  . Sexual activity: Not Currently    Partners: Male  Other Topics Concern  . Not on file  Social History Narrative  . Not on file   Social Determinants of Health   Financial Resource Strain: Not on file  Food Insecurity: Not on file  Transportation Needs: Not on file  Physical Activity: Not on file  Stress: Not  on file  Social Connections: Not on file  Intimate Partner Violence: Not on file    Outpatient Medications Prior to Visit  Medication Sig Dispense Refill  . diphenhydrAMINE (BENADRYL) 25 mg capsule Take 1 capsule (25 mg total) by mouth at bedtime as needed for sleep. 30 capsule 1  . divalproex (DEPAKOTE) 500 MG DR tablet Take 1 tablet (500 mg total) by mouth 2 (two) times daily.    Marland Kitchen doxylamine, Sleep, (UNISOM) 25 MG tablet Take 25 mg by mouth at bedtime.     No facility-administered medications prior to visit.    Allergies  Allergen Reactions  . Tomato (Diagnostic) Other (See Comments)    Tongue break out.   . Citrus Rash         Objective:    Physical Exam Neurological:     General: No focal deficit present.     Mental Status: She is alert.     Cranial Nerves: Cranial nerves 2-12 are intact. No cranial nerve deficit or facial asymmetry.     Sensory: Sensation is intact.     Motor: Motor function is intact.     Coordination:  Coordination is intact.     Gait: Gait is intact.  Psychiatric:        Attention and Perception: She is inattentive (at times).        Mood and Affect: Mood and affect normal.        Speech: Speech normal.        Behavior: Behavior normal. Behavior is cooperative.        Thought Content: Thought content normal.        Cognition and Memory: Memory is impaired.        Judgment: Judgment is impulsive.      BP 139/85   Pulse 80   Temp 98.6 F (37 C)   Resp 16   Ht _0  (1.626 m)   Wt 190 lb 6 oz (86.4 kg)   SpO2 100%   BMI 32.68 kg/m  Wt Readings from Last 3 Encounters:  04/06/22 190 lb 6 oz (86.4 kg)  03/02/22 195 lb 6 oz (88.6 kg)  11/28/21 208 lb 2 oz (94.4 kg)     Health Maintenance Due  Topic Date Due  . Zoster Vaccines- Shingrix (1 of 2) Never done  . COLONOSCOPY (Pts 45-72yr Insurance coverage will need to be confirmed)  Never done  . Pneumonia Vaccine 71 Years old (1 - PCV) Never done  . COVID-19 Vaccine (3 - Pfizer  risk series) 11/28/2020  . INFLUENZA VACCINE  02/17/2022    There are no preventive care reminders to display for this patient.  Lab Results  Component Value Date   TSH 1.09 03/02/2022   Lab Results  Component Value Date   WBC 7.3 03/02/2022   HGB 13.0 03/02/2022   HCT 39.6 03/02/2022   MCV 89.6 03/02/2022   PLT 272.0 03/02/2022   Lab Results  Component Value Date   NA 140 04/06/2022   K 4.5 04/06/2022   CO2 28 04/06/2022   GLUCOSE 87 04/06/2022   BUN 21 04/06/2022   CREATININE 1.41 (H) 04/06/2022   BILITOT 0.4 04/06/2022   ALKPHOS 33 (L) 04/06/2022   AST 20 04/06/2022   ALT 21 04/06/2022   PROT 7.4 04/06/2022   ALBUMIN 4.3 04/06/2022   CALCIUM 10.1 04/06/2022   ANIONGAP 9 07/29/2021   GFR 37.73 (L) 04/06/2022   Lab Results  Component Value Date   CHOL 233 (H) 11/28/2021   Lab Results  Component Value Date   HDL 71.50 11/28/2021   Lab Results  Component Value Date   LDLCALC 135 (H) 11/28/2021   Lab Results  Component Value Date   TRIG 133.0 11/28/2021   Lab Results  Component Value Date   CHOLHDL 3 11/28/2021   Lab Results  Component Value Date   HGBA1C 6.3 11/28/2021      Assessment & Plan:   Problem List Items Addressed This Visit       Other   Abnormal MMSE - Primary   Relevant Orders   Ambulatory referral to Neurology   AMB Referral to [facility]  Memory loss    mmse in office       Relevant Orders   Ambulatory referral to Neurology   AMB Referral to [facility]  Decreased GFR    Repeat cmp pending results Ordering renal u/s pending results May consider nephrology referral      Relevant Orders   [place]   Comprehensive metabolic panel (Completed)    No orders of the defined types were placed in this encounter.  Follow-up: Return in about 1 month (around 05/06/2022) for f/u memory.    Eugenia Pancoast, FNP

## 2022-04-07 LAB — COMPREHENSIVE METABOLIC PANEL
ALT: 21 U/L (ref 0–35)
AST: 20 U/L (ref 0–37)
Albumin: 4.3 g/dL (ref 3.5–5.2)
Alkaline Phosphatase: 33 U/L — ABNORMAL LOW (ref 39–117)
BUN: 21 mg/dL (ref 6–23)
CO2: 28 mEq/L (ref 19–32)
Calcium: 10.1 mg/dL (ref 8.4–10.5)
Chloride: 104 mEq/L (ref 96–112)
Creatinine, Ser: 1.41 mg/dL — ABNORMAL HIGH (ref 0.40–1.20)
GFR: 37.73 mL/min — ABNORMAL LOW (ref >60.00)
Glucose, Bld: 87 mg/dL (ref 70–99)
Potassium: 4.5 mEq/L (ref 3.5–5.1)
Sodium: 140 mEq/L (ref 135–145)
Total Bilirubin: 0.4 mg/dL (ref 0.2–1.2)
Total Protein: 7.4 g/dL (ref 6.0–8.3)

## 2022-04-08 NOTE — Progress Notes (Signed)
Kidney function remains stable however it is still lower than average.  Awaiting the results of the kidney ultrasound to determine if we need to send to urology  Advised patient to work on drinking enough throughout the day goal is 4 to 5 glasses of water daily

## 2022-04-09 ENCOUNTER — Telehealth: Payer: Self-pay | Admitting: Family

## 2022-04-09 NOTE — Telephone Encounter (Signed)
Patients daughter returned a call to you,would like a phone call.

## 2022-04-09 NOTE — Telephone Encounter (Signed)
Spoke to pt daughter

## 2022-04-13 NOTE — Assessment & Plan Note (Signed)
mmse in office

## 2022-04-13 NOTE — Assessment & Plan Note (Signed)
Repeat cmp pending results Ordering renal u/s pending results May consider nephrology referral

## 2022-04-17 ENCOUNTER — Telehealth: Payer: Self-pay | Admitting: Family

## 2022-04-17 NOTE — Telephone Encounter (Signed)
Pt would need to be seen in office for this. Might need to go to urgent care since the weekend.

## 2022-04-17 NOTE — Telephone Encounter (Signed)
Scheduled ov for 04/20/22

## 2022-04-17 NOTE — Telephone Encounter (Signed)
Pt has a ear infection, wanted to know should she try over the counter meds or could she get some meds percribed? Call back # is [phone]

## 2022-04-20 ENCOUNTER — Ambulatory Visit: Payer: Medicare Other | Admitting: Family

## 2022-04-22 ENCOUNTER — Telehealth: Payer: Self-pay

## 2022-04-22 NOTE — Telephone Encounter (Signed)
Called and spoke to the daughter and gave her the number to call and set up the CT. Also gave her the other number of the referral we put in for her mom.

## 2022-04-22 NOTE — Telephone Encounter (Signed)
Spoke to daughter and gave her the numbers to the referral that was put it.

## 2022-04-22 NOTE — Telephone Encounter (Signed)
-----   Message from Eugenia Pancoast,  sent at 04/22/2022 10:17 AM EDT ----- Regarding: CT abd If pt is still interested in setting up CT abd pelvis to look at the mass, she can call to schedule this with [facility], [phone] as order is still active.

## 2022-04-23 ENCOUNTER — Telehealth: Payer: Self-pay | Admitting: *Deleted

## 2022-04-23 ENCOUNTER — Other Ambulatory Visit: Payer: Self-pay | Admitting: Family

## 2022-04-23 ENCOUNTER — Ambulatory Visit (INDEPENDENT_AMBULATORY_CARE_PROVIDER_SITE_OTHER): Payer: Medicare Other | Admitting: Podiatry

## 2022-04-23 ENCOUNTER — Encounter: Payer: Self-pay | Admitting: Podiatry

## 2022-04-23 DIAGNOSIS — F99 Mental disorder, not otherwise specified: Secondary | ICD-10-CM

## 2022-04-23 DIAGNOSIS — L84 Corns and callosities: Secondary | ICD-10-CM | POA: Insufficient documentation

## 2022-04-23 DIAGNOSIS — N1831 Chronic kidney disease, stage 3a: Secondary | ICD-10-CM | POA: Diagnosis not present

## 2022-04-23 DIAGNOSIS — M79674 Pain in right toe(s): Secondary | ICD-10-CM | POA: Diagnosis not present

## 2022-04-23 DIAGNOSIS — B351 Tinea unguium: Secondary | ICD-10-CM

## 2022-04-23 DIAGNOSIS — M79675 Pain in left toe(s): Secondary | ICD-10-CM | POA: Diagnosis not present

## 2022-04-23 DIAGNOSIS — R413 Other amnesia: Secondary | ICD-10-CM

## 2022-04-23 NOTE — Chronic Care Management (AMB) (Signed)
  Care Coordination   Note   04/23/2022 Name: Leiya Keesey MRN: [id number] DOB: [birth year]  Faline Langer is a 71 y.o. year old female who sees Eugenia Pancoast, FNP for primary care. I reached out to [employer] by phone today to offer care coordination services.  Ms. Deweese was given information about Care Coordination services today including:   The Care Coordination services include support from the care team which includes your Nurse Coordinator, Clinical Social Worker, or Pharmacist.  The Care Coordination team is here to help remove barriers to the health concerns and goals most important to you. Care Coordination services are voluntary, and the patient may decline or stop services at any time by request to their care team member.   Care Coordination Consent Status: Patient agreed to services and verbal consent obtained.   Follow up plan:  Telephone appointment with care coordination team member scheduled for:  04/29/2022  Encounter Outcome:  Pt. Scheduled from referral   Julian Hy, [place] Direct Dial: [phone]

## 2022-04-23 NOTE — Progress Notes (Signed)
This patient returns to my office for at risk foot care.  This patient requires this care by a professional since this patient will be at risk due to having CKD.  This patient is unable to cut nails herself since the patient cannot reach her nails.These nails are painful walking and wearing shoes.  This patient presents for at risk foot care today.  General Appearance  Alert, conversant and in no acute stress.  Vascular  Dorsalis pedis and posterior tibial  pulses are palpable  bilaterally.  Capillary return is within normal limits  bilaterally. Temperature is within normal limits  bilaterally.  Neurologic  Senn-Weinstein monofilament wire test within normal limits  bilaterally. Muscle power within normal limits bilaterally.  Nails Thick disfigured discolored nails with subungual debris  from hallux to fifth toes right foot and debride nails 1-4 left foot. No evidence of bacterial infection or drainage bilaterally.  Orthopedic  No limitations of motion  feet .  No crepitus or effusions noted.  No bony pathology or digital deformities noted. Amputation fifth toe left.  HAV  B/L  Skin  normotropic skin with no porokeratosis noted bilaterally.  No signs of infections or ulcers noted.   Dryscaly heel with fissure.  Onychomycosis  Pain in right toes  Pain in left toes  Consent was obtained for treatment procedures.   Mechanical debridement of nails 1-5  bilaterally performed with a nail nipper.  Filed with dremel without incident. Debride callus hallux. with # 15 blade.Told to use moisturizer on heels  B/L.   Return office visit  3 months                   Told patient to return for periodic foot care and evaluation due to potential at risk complications.   Gardiner Barefoot DPM

## 2022-04-23 NOTE — Progress Notes (Signed)
Amb ref

## 2022-04-29 ENCOUNTER — Encounter: Payer: Self-pay | Admitting: *Deleted

## 2022-04-29 ENCOUNTER — Ambulatory Visit: Payer: Self-pay | Admitting: *Deleted

## 2022-04-29 ENCOUNTER — Ambulatory Visit
Admission: RE | Admit: 2022-04-29 | Discharge: 2022-04-29 | Disposition: A | Discharge status: Home / Self Care | Payer: Medicare Other | Source: Ambulatory Visit | Attending: Family | Admitting: Family

## 2022-04-29 ENCOUNTER — Telehealth: Payer: Self-pay | Admitting: *Deleted

## 2022-04-29 DIAGNOSIS — R1032 Left lower quadrant pain: Secondary | ICD-10-CM | POA: Diagnosis not present

## 2022-04-29 DIAGNOSIS — R1904 Left lower quadrant abdominal swelling, mass and lump: Secondary | ICD-10-CM | POA: Diagnosis not present

## 2022-04-29 MED ORDER — IOHEXOL 300 MG/ML  SOLN
100.0000 mL | Freq: Once | INTRAMUSCULAR | Status: AC | PRN
  Administered 2022-04-29: 80 mL via INTRAVENOUS

## 2022-04-29 NOTE — Patient Outreach (Signed)
  Care Coordination   04/29/2022 Name: Alizza Sacra MRN: [id number] DOB: Jan 06, 1951   Care Coordination Outreach Attempts:  An unsuccessful telephone outreach was attempted for a scheduled appointment today.  Follow Up Plan:  Additional outreach attempts will be made to offer the patient care coordination information and services.   Encounter Outcome:  No Answer  Care Coordination Interventions Activated:  No   Care Coordination Interventions:  No, not indicated    Liley Rake, [place] Worker  [facility] Care Management [phone]

## 2022-05-01 ENCOUNTER — Ambulatory Visit: Payer: Self-pay | Admitting: *Deleted

## 2022-05-03 IMAGING — CR DG CHEST 2V
2 series · 2 of 2 positions shown · non-contrast
Comparison: None.

CLINICAL DATA: Chest pain, hypertension, manic episode,
hallucinations, paranoia

EXAM:
CHEST - 2 VIEW

[w chest pa]
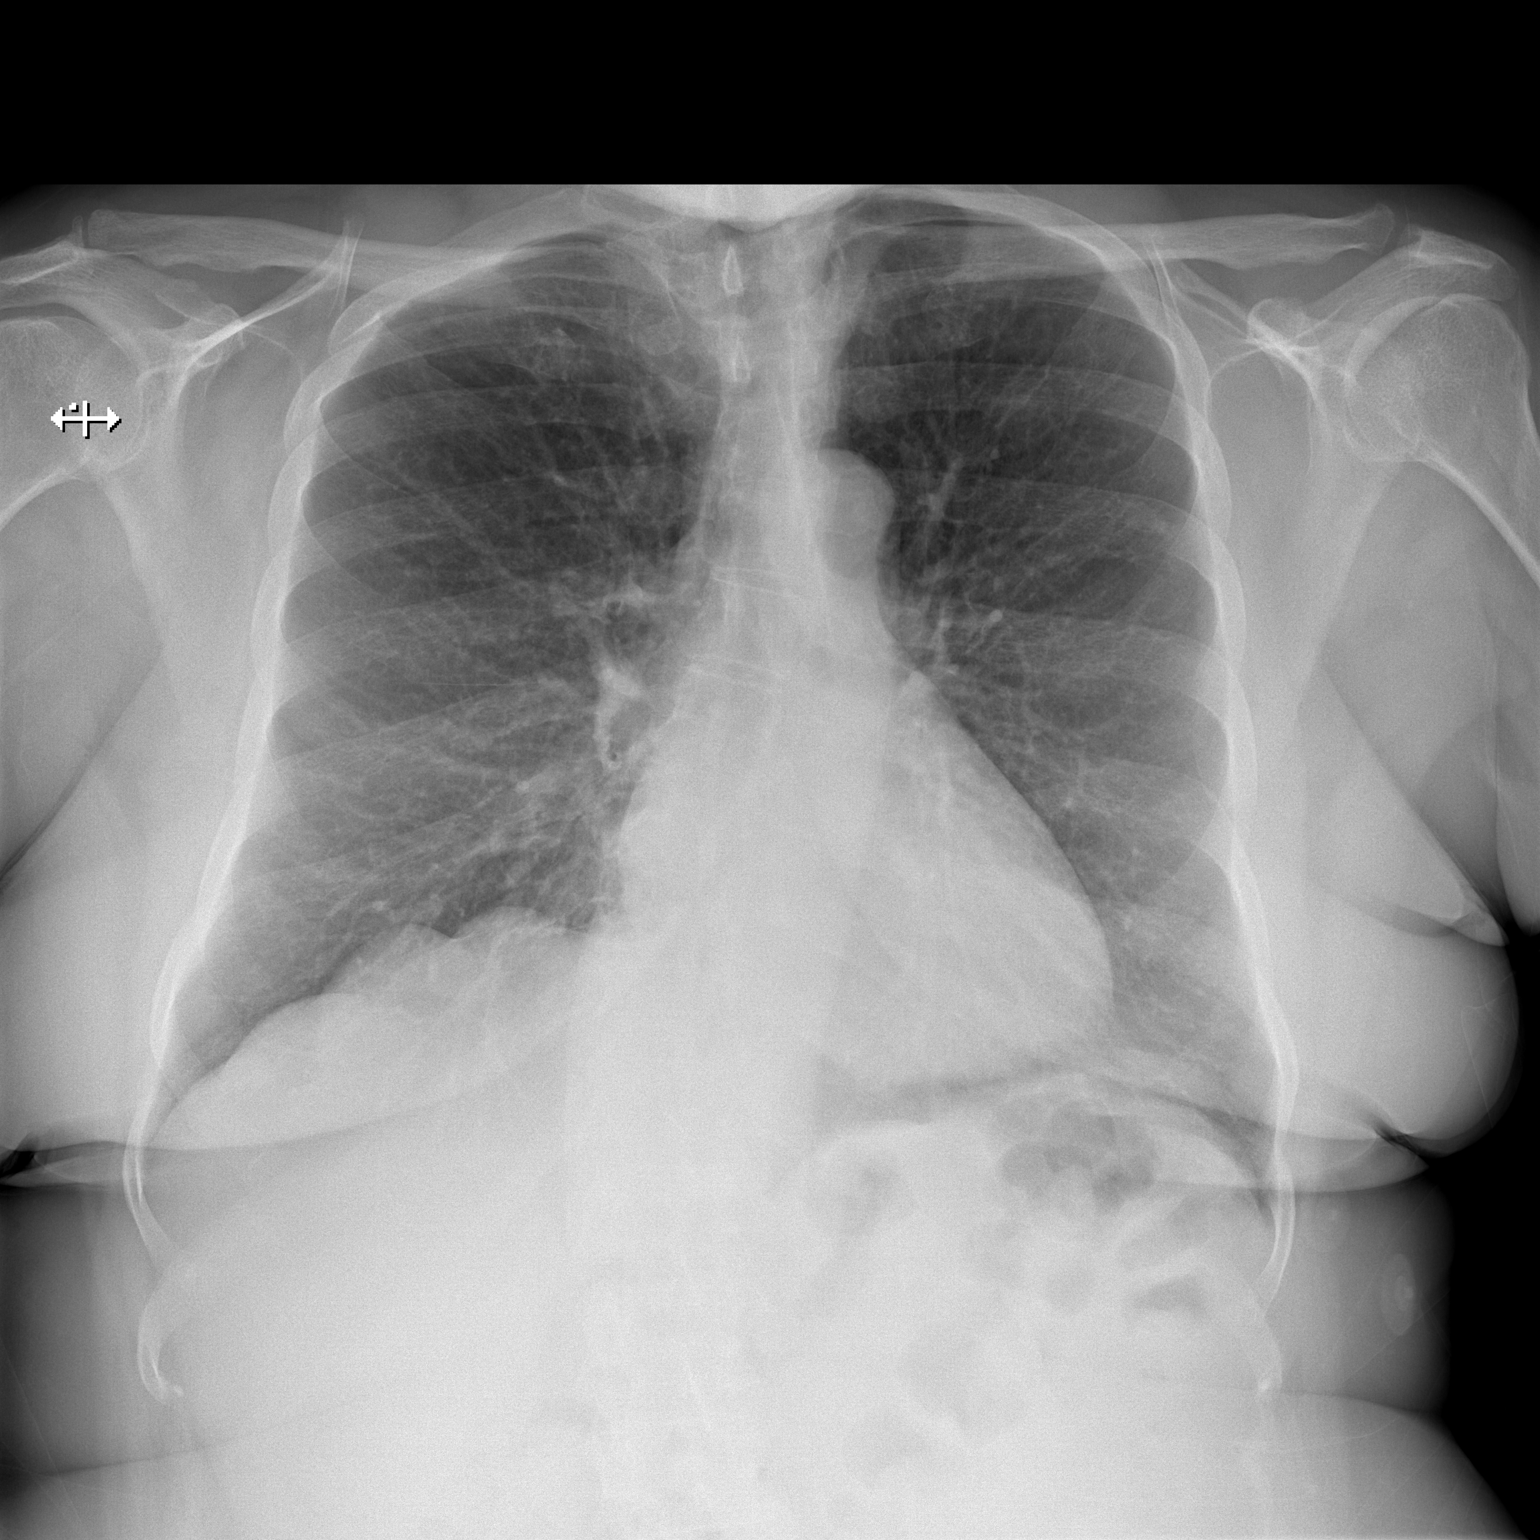

[w chest lat]
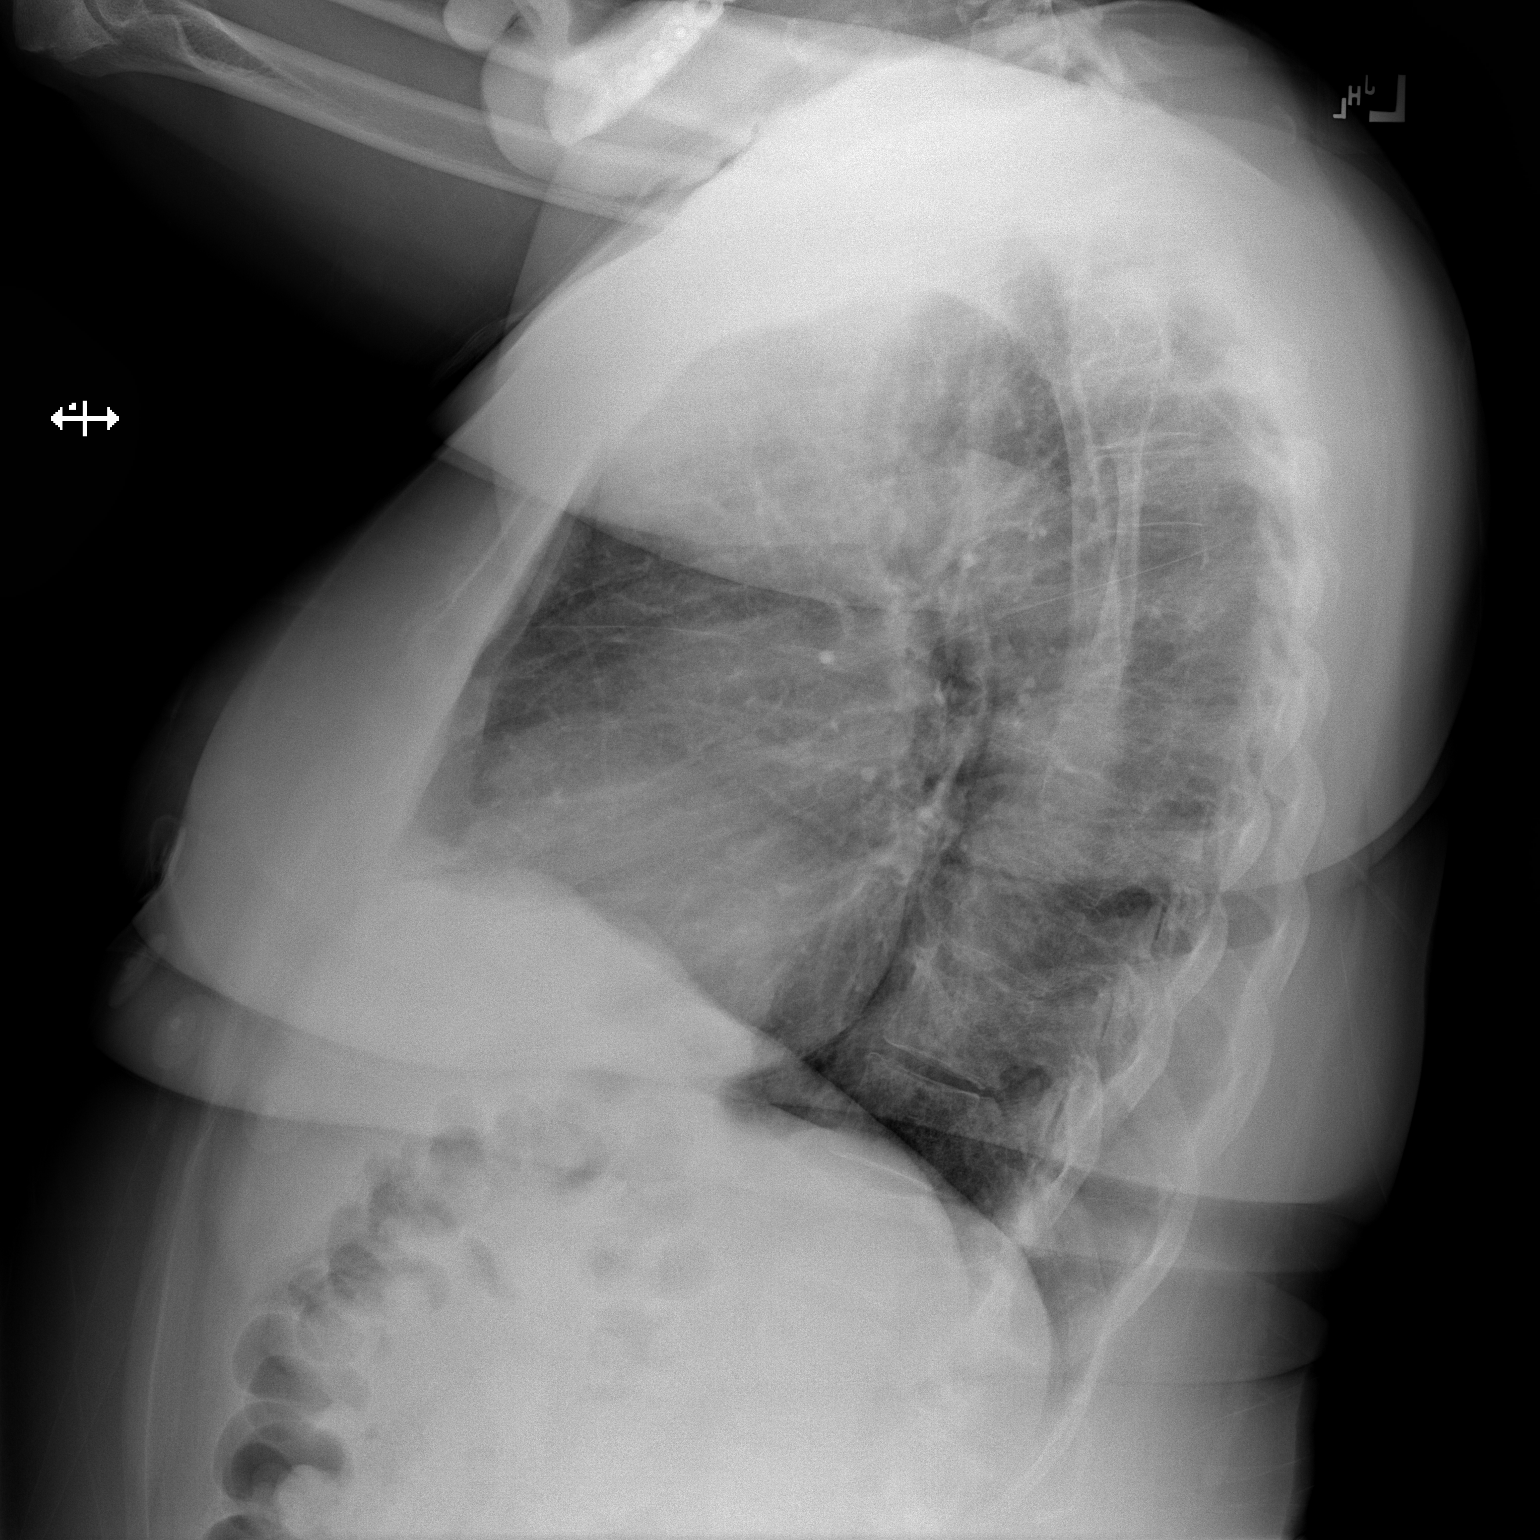

[2 of 2 positions shown; findings below may reference images not displayed]

FINDINGS: Frontal and lateral views of the chest demonstrate an unremarkable
cardiac silhouette. Lungs are hyperinflated with increased
interstitial prominence compatible with emphysema. No airspace
disease, effusion, or pneumothorax. No acute bony abnormalities.
IMPRESSION: 1. Emphysema.  No acute process.

## 2022-05-04 ENCOUNTER — Other Ambulatory Visit: Payer: Self-pay | Admitting: Family

## 2022-05-04 DIAGNOSIS — I700 Atherosclerosis of aorta: Secondary | ICD-10-CM | POA: Insufficient documentation

## 2022-05-04 DIAGNOSIS — K4020 Bilateral inguinal hernia, without obstruction or gangrene, not specified as recurrent: Secondary | ICD-10-CM

## 2022-05-04 NOTE — Patient Instructions (Signed)
Visit Information  Thank you for taking time to visit with me today. Please don't hesitate to contact me if I can be of assistance to you.   Following are the goals we discussed today:   Goals Addressed             This Visit's Progress    In home care needs       Care Coordination Interventions: Phone call to patient's daughter to discuss options for in home care for patient Patient's condition progressing-additional in home supports needed Confirmed that patient receives food stamps and assistance through [place] care services discussed as well as mental health follow up Option of FMLA for daughter also discussed as an option Personal care Services through [place] discussed- request form to be sent to patient's provider to complete Mental health resources to be emailed to patient's daughter for medication management -[email or web address] Solution-Focused Strategies employed:  Active listening / Reflection utilized  Emotional Support Provided Caregiver stress acknowledged          Our next appointment is by telephone on 05/15/22 at 1:30pm  Please call the care guide team at [phone] if you need to cancel or reschedule your appointment.   If you are experiencing a Mental Health or [place] or need someone to talk to, please call the Suicide and Crisis Lifeline: 988 call 911   Patient verbalizes understanding of instructions and care plan provided today and agrees to view in [place]. Active MyChart status and patient understanding of how to access instructions and care plan via MyChart confirmed with patient.     Telephone follow up appointment with care management team member scheduled for: 05/15/22  Elliot Gurney, [place] Worker  [facility] Care Management [phone]

## 2022-05-04 NOTE — Progress Notes (Signed)
I'm thinking it may be the fibroid that causes her the abdominal pain possibly.  She would have to see gyn to discuss this further. As far as inguinal hernias she could be evaluated by general surgeon. Would she like both referrals?

## 2022-05-04 NOTE — Progress Notes (Signed)
Ct shows small inguinal hernia. Will continue to monitor.   Uterine fibroids seen which can cause pain at times.   Does show dilatation of common bile duct however liver function not elevated so for now we will just monitor for stability.

## 2022-05-04 NOTE — Patient Outreach (Signed)
  Care Coordination   Initial Visit Note   05/04/2022 Name: Hortence Charter MRN: [id number] DOB: 02/22/1951  Yanitza Shvartsman is a 71 y.o. year old female who sees Eugenia Pancoast, FNP for primary care. I spoke with  [name] 's daughter Dimas Chyle by phone today.  What matters to the patients health and wellness today?  In home care services    Goals Addressed             This Visit's Progress    In home care needs       Care Coordination Interventions: Phone call to patient's daughter to discuss options for in home care for patient Patient's condition progressing-additional in home supports needed Confirmed that patient receives food stamps and assistance through [facility] discussed as well as mental health follow up Option of FMLA for daughter also discussed as an option Personal care Services through [facility] discussed- request form to be sent to patient's provider to complete Mental health resources to be emailed to patient's daughter for medication management -[email or web address] Solution-Focused Strategies employed:  Active listening / Reflection utilized  Emotional Support Provided Caregiver stress acknowledged          SDOH assessments and interventions completed:  Yes  SDOH Interventions Today    Flowsheet Row Most Recent Value  SDOH Interventions   Food Insecurity Interventions Intervention Not Indicated  Housing Interventions Intervention Not Indicated  Transportation Interventions Intervention Not Indicated        Care Coordination Interventions Activated:  Yes  Care Coordination Interventions:  Yes, provided   Follow up plan: Follow up call scheduled for 05/15/22    Encounter Outcome:  Pt. Visit Completed

## 2022-05-07 ENCOUNTER — Other Ambulatory Visit: Payer: Self-pay | Admitting: Family

## 2022-05-07 DIAGNOSIS — K4020 Bilateral inguinal hernia, without obstruction or gangrene, not specified as recurrent: Secondary | ICD-10-CM

## 2022-05-07 DIAGNOSIS — D259 Leiomyoma of uterus, unspecified: Secondary | ICD-10-CM | POA: Insufficient documentation

## 2022-05-07 DIAGNOSIS — R10819 Abdominal tenderness, unspecified site: Secondary | ICD-10-CM

## 2022-05-07 NOTE — Progress Notes (Signed)
Both referrals have been sent  Let us know if pt does not hear anything from general surgery and or GYN for appts in the next two weeks.   Will keep a look out of paperwork.  Please advise them also to reach out if they do not hear anything from [place] in the next one week to make sure we receive this paperwork.

## 2022-05-07 NOTE — Progress Notes (Signed)
Bil inguin

## 2022-05-13 ENCOUNTER — Ambulatory Visit (INDEPENDENT_AMBULATORY_CARE_PROVIDER_SITE_OTHER): Payer: Medicare Other

## 2022-05-13 VITALS — Ht 64.0 in | Wt 190.0 lb

## 2022-05-13 DIAGNOSIS — Z1211 Encounter for screening for malignant neoplasm of colon: Secondary | ICD-10-CM | POA: Diagnosis not present

## 2022-05-13 DIAGNOSIS — Z0000 Encounter for general adult medical examination without abnormal findings: Secondary | ICD-10-CM | POA: Diagnosis not present

## 2022-05-13 NOTE — Progress Notes (Unsigned)
Patient ID: Shelby Jordan, female   DOB: 1951/04/10, 71 y.o.   MRN: [id number]  Chief Complaint: Left abdominal wall pain.  History of Present Illness Shelby Jordan is a 71 y.o. female with intermittent history of sporadic rumbling and rolling abdominal cramping pain.  Reports normal bowel activity, without association.  Abdominal pain does not seem to be related to eating.  She denies any history of fevers or chills.  She pinches her abdominal roll on the left side stating that she is here for her hernia.  I indicated that the hernia she was told she had would be down in the groin not in this location.  On examination she has no pain, history of bulge or tenderness in these areas. She also denies any history of right upper quadrant pain, jaundice, or acholic stools.  Past Medical History Past Medical History:  Diagnosis Date   Bipolar 1 disorder ([place])    Depression    Hepatitis C    High cholesterol    Hypertension    UTI (urinary tract infection)       Past Surgical History:  Procedure Laterality Date   APPENDECTOMY     BREAST BIOPSY Right 03/04/2022   stereo bx, mass "X" clip-path pending   TOE AMPUTATION      Allergies  Allergen Reactions   Tomato (Diagnostic) Other (See Comments)    Tongue break out.    Citrus Rash    Current Outpatient Medications  Medication Sig Dispense Refill   diphenhydrAMINE (BENADRYL) 25 mg capsule Take 1 capsule (25 mg total) by mouth at bedtime as needed for sleep. 30 capsule 1   divalproex (DEPAKOTE) 500 MG DR tablet Take 1 tablet (500 mg total) by mouth 2 (two) times daily.     doxylamine, Sleep, (UNISOM) 25 MG tablet Take 25 mg by mouth at bedtime.     No current facility-administered medications for this visit.    Family History Family History  Problem Relation Age of Onset   Mental illness Mother        born in mental hospital   Breast cancer Neg Hx       Social History Social History   Tobacco Use   Smoking status: Former     Packs/day: 1.00    Years: 30.00    Total pack years: 30.00    Types: Cigarettes    Quit date: 2023    Years since quitting: 0.8   Smokeless tobacco: Never  Vaping Use   Vaping Use: Never used  Substance Use Topics   Alcohol use: Never   Drug use: Never        Review of Systems  Constitutional: Negative.   HENT: Negative.    Eyes: Negative.   Respiratory: Negative.    Cardiovascular: Negative.   Gastrointestinal: Negative.   Genitourinary: Negative.   Skin: Negative.   Neurological: Negative.   Psychiatric/Behavioral:  Negative for depression, substance abuse and suicidal ideas.        Bipolar disorder      Physical Exam Blood pressure 132/86, pulse 99, temperature 98.4 F (36.9 C), temperature source Oral, height '5\' 4"'$  (1.626 m), weight 193 lb 6.4 oz (87.7 kg), SpO2 97 %. Last Weight  Most recent update: 05/14/2022  3:05 PM    Weight  87.7 kg (193 lb 6.4 oz)             CONSTITUTIONAL: Well developed, and nourished, appropriately responsive and aware without distress. EYES: Sclera non-icteric.   EARS, NOSE, MOUTH  AND THROAT:The oropharynx is clear. Oral mucosa is pink and moist.  Hearing is intact to voice.  NECK: Trachea is midline, and there is no jugular venous distension.  LYMPH NODES:  Lymph nodes in the neck are not enlarged. RESPIRATORY:  Lungs are clear, and breath sounds are equal bilaterally. Normal respiratory effort without pathologic use of accessory muscles. CARDIOVASCULAR: Heart is regular in rate and rhythm. GI: The abdomen is soft, nontender, and nondistended. There were no palpable masses. I did not appreciate any right upper quadrant tenderness whatsoever.  She thought there was a left sided hernia with in her abdominal wall.  But there were no masses or fascial defects in the left lower quadrant. GU: Levada Dy is present as chaperone: There are no remarkable/clinically appreciable inguinal hernias present. MUSCULOSKELETAL:  Symmetrical muscle  tone appreciated in all four extremities.    SKIN: Skin turgor is normal. No pathologic skin lesions appreciated.  NEUROLOGIC:  Motor and sensation appear grossly normal.  Cranial nerves are grossly without defect. PSYCH:  Alert and oriented to person, place and time. Affect is appropriate for situation.  Data Reviewed I have personally reviewed what is currently available of the patient's imaging, recent labs and medical records.   Labs:     Latest Ref Rng & Units 03/02/2022    2:52 PM 11/28/2021   12:17 PM 07/29/2021   12:11 AM  CBC  WBC 4.0 - 10.5 K/uL 7.3  6.0  9.6   Hemoglobin 12.0 - 15.0 g/dL 13.0  13.5  14.3   Hematocrit 36.0 - 46.0 % 39.6  40.6  43.5   Platelets 150.0 - 400.0 K/uL 272.0  323.0  312       Latest Ref Rng & Units 04/06/2022    2:41 PM 03/02/2022    2:52 PM 11/28/2021   12:17 PM  CMP  Glucose 70 - 99 mg/dL 87  140  133   BUN 6 - 23 mg/dL '21  24  19   '$ Creatinine 0.40 - 1.20 mg/dL 1.41  1.68  1.33   Sodium 135 - 145 mEq/L 140  140  139   Potassium 3.5 - 5.1 mEq/L 4.5  4.6  4.8   Chloride 96 - 112 mEq/L 104  105  102   CO2 19 - 32 mEq/L '28  28  31   '$ Calcium 8.4 - 10.5 mg/dL 10.1  10.6  10.2   Total Protein 6.0 - 8.3 g/dL 7.4  7.3  7.5   Total Bilirubin 0.2 - 1.2 mg/dL 0.4  0.6  0.6   Alkaline Phos 39 - 117 U/L 33  33  41   AST 0 - 37 U/L '20  28  18   '$ ALT 0 - 35 U/L 21  35  25       Imaging: Radiology review:  CLINICAL DATA:  Left lower quadrant abdominal pain. Left lower quadrant outer abdominal mass.   EXAM: CT ABDOMEN AND PELVIS WITH CONTRAST   TECHNIQUE: Multidetector CT imaging of the abdomen and pelvis was performed using the standard protocol following bolus administration of intravenous contrast.   RADIATION DOSE REDUCTION: This exam was performed according to the departmental dose-optimization program which includes automated exposure control, adjustment of the mA and/or kV according to patient size and/or use of iterative reconstruction  technique.   CONTRAST:  36m OMNIPAQUE IOHEXOL 300 MG/ML  SOLN   COMPARISON:  None Available.   FINDINGS: Lower chest: No basilar airspace disease or pleural effusion. The heart is normal  in size.   Hepatobiliary: No focal hepatic abnormality. No calcified gallstones. The gallbladder is physiologically distended. There is dilatation of the proximal common bile duct at 13 mm, normal tapering to the duodenum. No visualized choledocholithiasis or obstructing mass.   Pancreas: Unremarkable. No pancreatic ductal dilatation or surrounding inflammatory changes.   Spleen: Subtle subcentimeter hypodensity in the upper spleen is nonspecific, typically benign. No splenomegaly.   Adrenals/Urinary Tract: Normal adrenal glands. No hydronephrosis or perinephric edema. Homogeneous renal enhancement with symmetric excretion on delayed phase imaging. 12 mm simple cyst in the upper left kidney, no imaging follow-up is recommended. No solid lesion or renal calculi. Urinary bladder is partially distended without wall thickening.   Stomach/Bowel: Lack of enteric contrast limits detailed bowel assessment. Allowing for this, stomach is normal in CT appearance. There is no small bowel obstruction or inflammation. Small bowel wall thickening. Surgical clips at the base of the cecum from prior appendectomy. Moderate stool in the ascending and transverse colon. Small volume of stool in the descending and sigmoid colon. Occasional distal descending and moderate sigmoid colonic diverticulosis. No diverticulitis. No colonic wall thickening or evidence of colonic mass. Small volume of stool distends the rectum.   Vascular/Lymphatic: Mild aorto bi-iliac atherosclerosis. No aortic aneurysm. Patent portal vein. No enlarged lymph nodes in the abdomen or pelvis. No inguinal adenopathy.   Reproductive: Heterogeneous uterus which is slightly bulbous with underlying fibroids. The ovaries are not well-defined,  there is no adnexal mass.   Other: No ascites. No abdominopelvic collection. Small bilateral fat containing inguinal hernias. No inflammation or bowel involvement. There is no superficial subcutaneous or abdominal wall mass. No intra-abdominopelvic mass.   Musculoskeletal: L5-S1 degenerative disc disease. T12 and L1 superior endplate Schmorl's nodes. No focal bone lesion or acute osseous findings. No musculoskeletal findings to account for pain.   IMPRESSION: 1. No acute abnormality. 2. Small bilateral fat containing inguinal hernias. No superficial or evidence of intra-abdominal mass. 3. Colonic diverticulosis without diverticulitis. 4. Uterine fibroids. 5. Dilatation of the proximal common bile duct at 13 mm, normal tapering to the duodenum. No abnormal gallbladder distension, no visualized choledocholithiasis or obstructing mass. Recommend correlation with LFTs. If elevated, consider further evaluation with MRCP. If LFTs are normal, no further evaluation is needed   Aortic Atherosclerosis (ICD10-I70.0).     Electronically Signed   By: Keith Rake M.D.   On: 04/30/2022 18:26   Within last 24 hrs: No results found.  Assessment    Normal LFTs with dilated common bile duct on CT scan.  No history of right upper quadrant pain consistent with cholecystitis.  No evidence of abdominal wall hernia, or clinically evident inguinal hernias.  Diverticulosis, without diverticulitis.  Has upcoming consultation with GI regarding possible colonoscopy. Patient Active Problem List   Diagnosis Date Noted   Uterine leiomyoma 05/07/2022   Atherosclerosis of aorta ([place]) 05/04/2022   Bilateral inguinal hernia without obstruction or gangrene 05/04/2022   Callus of toe 04/23/2022   Abnormal MMSE 03/05/2022   Memory loss 03/05/2022   Mania ([place]) 03/05/2022   Tobacco use 03/05/2022   Urinary urgency 03/05/2022   Decreased GFR 03/05/2022   Osteopenia of left hip 02/02/2022    Schizoaffective disorder, bipolar type ([place]) 11/28/2021   Hyperglycemia 11/28/2021   History of hepatitis C 11/28/2021   Chronic kidney disease, stage 3a ([place]) 11/28/2021   Obesity (BMI 35.0-39.9 without comorbidity) 11/28/2021   Other fatigue 11/28/2021   Chronic obstructive pulmonary disease ([place]) 11/28/2021   Mixed hyperlipidemia 11/28/2021  Post-menopausal 11/28/2021   Left lower quadrant abdominal mass 11/28/2021   History of tobacco abuse 11/28/2021   Bipolar I disorder, most recent episode (or current) manic ([place]) 12/15/2019    Plan    We will repeat LFTs to ensure were not missing any intermittent passage of stones. Also will obtain right upper quadrant ultrasound to ensure the absence of cholelithiasis. I gave her reassurances regarding the lack of evidence of any inguinal hernias or abdominal wall hernias.  Face-to-face time spent with the patient and accompanying care providers(if present) was 30 minutes, with more than 50% of the time spent counseling, educating, and coordinating care of the patient.    These notes generated with voice recognition software. I apologize for typographical errors.  Ronny Bacon M.D., FACS 05/14/2022, 3:27 PM

## 2022-05-13 NOTE — Progress Notes (Signed)
Subjective:   Shelby Jordan is a 71 y.o. female who presents for Medicare Annual (Subsequent) preventive examination.  Review of Systems    Virtual Visit via Telephone Note  I connected with  Shelby Jordan on 05/13/22 at  1:45 PM EDT by telephone and verified that I am speaking with the correct person using two identifiers.  Location: Patient: Home Provider: Office Persons participating in the virtual visit: patient/Nurse Health Advisor   I discussed the limitations, risks, security and privacy concerns of performing an evaluation and management service by telephone and the availability of in person appointments. The patient expressed understanding and agreed to proceed.  Interactive audio and video telecommunications were attempted between this nurse and patient, however failed, due to patient having technical difficulties OR patient did not have access to video capability.  We continued and completed visit with audio only.  Some vital signs may be absent or patient reported.   Shelby Peaches, LPN  Cardiac Risk Factors include: advanced age (>28mn, >>79women)     Objective:    Today's Vitals   05/13/22 1357  Weight: 190 lb (86.2 kg)  Height: '5\' 4"'$  (1.626 m)   Body mass index is 32.61 kg/m.     05/13/2022    2:15 PM 07/29/2021   12:08 AM 09/24/2020    6:05 PM 12/14/2019   11:00 PM 12/14/2019   11:35 AM 12/13/2019    4:53 AM  Advanced Directives  Does Patient Have a Medical Advance Directive? No No No  No No  Would patient like information on creating a medical advance directive? No - Patient declined No - Patient declined   No - Patient declined No - Patient declined     Information is confidential and restricted. Go to Review Flowsheets to unlock data.    Current Medications (verified) Outpatient Encounter Medications as of 05/13/2022  Medication Sig   diphenhydrAMINE (BENADRYL) 25 mg capsule Take 1 capsule (25 mg total) by mouth at bedtime as needed for sleep.    divalproex (DEPAKOTE) 500 MG DR tablet Take 1 tablet (500 mg total) by mouth 2 (two) times daily.   doxylamine, Sleep, (UNISOM) 25 MG tablet Take 25 mg by mouth at bedtime.   No facility-administered encounter medications on file as of 05/13/2022.    Allergies (verified) Tomato (diagnostic) and Citrus   History: Past Medical History:  Diagnosis Date   Bipolar 1 disorder ([place]    Depression    Hepatitis C    High cholesterol    Hypertension    UTI (urinary tract infection)    Past Surgical History:  Procedure Laterality Date   APPENDECTOMY     BREAST BIOPSY Right 03/04/2022   stereo bx, mass "X" clip-path pending   TOE AMPUTATION     Family History  Problem Relation Age of Onset   Mental illness Mother        born in mental hospital   Breast cancer Neg Hx    Social History   Socioeconomic History   Marital status: Legally Separated    Spouse name: Not on file   Number of children: Not on file   Years of education: Not on file   Highest education level: Not on file  Occupational History   Not on file  Tobacco Use   Smoking status: Former    Packs/day: 1.00    Years: 30.00    Total pack years: 30.00    Types: Cigarettes    Quit date: 2023  Years since quitting: 0.8   Smokeless tobacco: Never  Vaping Use   Vaping Use: Never used  Substance and Sexual Activity   Alcohol use: Never   Drug use: Never   Sexual activity: Not Currently    Partners: Male  Other Topics Concern   Not on file  Social History Narrative   Not on file   Social Determinants of Health   Financial Resource Strain: Low Risk  (05/13/2022)   Overall Financial Resource Strain (CARDIA)    Difficulty of Paying Living Expenses: Not hard at all  Food Insecurity: No Food Insecurity (05/13/2022)   Hunger Vital Sign    Worried About Running Out of Food in the Last Year: Never true    Ran Out of Food in the Last Year: Never true  Transportation Needs: No Transportation Needs (05/13/2022)    PRAPARE - Hydrologist (Medical): No    Lack of Transportation (Non-Medical): No  Physical Activity: Inactive (05/13/2022)   Exercise Vital Sign    Days of Exercise per Week: 0 days    Minutes of Exercise per Session: 0 min  Stress: No Stress Concern Present (05/13/2022)   [place]    Feeling of Stress : Not at all  Social Connections: Moderately Integrated (05/13/2022)   Social Connection and Isolation Panel [NHANES]    Frequency of Communication with Friends and Family: More than three times a week    Frequency of Social Gatherings with Friends and Family: More than three times a week    Attends Religious Services: More than 4 times per year    Active Member of [employer] or Organizations: Yes    Attends Music therapist: More than 4 times per year    Marital Status: Separated    Tobacco Counseling Counseling given: Not Answered   Clinical Intake:  Pre-visit preparation completed: No  Pain : No/denies pain     BMI - recorded: 32.61 Nutritional Status: BMI > 30  Obese Nutritional Risks: None Diabetes: No  How often do you need to have someone help you when you read instructions, pamphlets, or other written materials from your doctor or pharmacy?: 1 - Never  Diabetic?  No  Interpreter Needed?: No  Information entered by :: Rolene Arbour LPN   Activities of Daily Living    05/13/2022    2:12 PM  In your present state of health, do you have any difficulty performing the following activities:  Hearing? 0  Vision? 0  Difficulty concentrating or making decisions? 0  Walking or climbing stairs? 0  Dressing or bathing? 0  Doing errands, shopping? 0  Preparing Food and eating ? N  Using the Toilet? N  In the past six months, have you accidently leaked urine? N  Comment Wears pampers on occasions. Followed by Urologist  Do you have problems with loss of bowel  control? N  Managing your Medications? N  Managing your Finances? N  Housekeeping or managing your Housekeeping? N    Patient Care Team: Eugenia Pancoast, FNP as PCP - General (Family Medicine) [place], [place] M, [place] as Social Worker  Indicate any recent Medical Services you may have received from other than [facility] providers in the past year (date may be approximate).     Assessment:   This is a routine wellness examination for Shelby Jordan.  Hearing/Vision screen Hearing Screening - Comments:: Denies hearing difficulties   Vision Screening - Comments:: Wears rx glasses -  up to date with routine eye exams with  [facility]  Dietary issues and exercise activities discussed: Current Exercise Habits: The patient does not participate in regular exercise at present, Exercise limited by: None identified   Goals Addressed               This Visit's Progress     No current goals (pt-stated)         Depression Screen    05/13/2022    2:10 PM  PHQ 2/9 Scores  PHQ - 2 Score 0    Fall Risk    05/13/2022    2:14 PM  [place] in the past year? 0  Number falls in past yr: 0  Injury with Fall? 0  Risk for fall due to : No Fall Risks  Follow up Falls prevention discussed    [place]:  Any stairs in or around the home? Yes  If so, are there any without handrails? No  Home free of loose throw rugs in walkways, pet beds, electrical cords, etc? Yes  Adequate lighting in your home to reduce risk of falls? Yes   ASSISTIVE DEVICES UTILIZED TO PREVENT FALLS:  Life alert? No  Use of a cane, walker or w/c? Yes  Grab bars in the bathroom? No  Shower chair or bench in shower? Yes  Elevated toilet seat or a handicapped toilet? No   TIMED UP AND GO:  Was the test performed? No . Audio Visit   Cognitive Function:    03/02/2022    2:27 PM  MMSE - Mini Mental State Exam  Orientation to time 4        05/13/2022    2:15 PM   6CIT Screen  What Year? 0 points  What month? 0 points  What time? 0 points  Count back from 20 0 points  Months in reverse 4 points  Repeat phrase 0 points  Total Score 4 points    Immunizations There is no immunization history for the selected administration types on file for this patient.  TDAP status: Up to date  Flu Vaccine status: Due, Education has been provided regarding the importance of this vaccine. Advised may receive this vaccine at local pharmacy or Health Dept. Aware to provide a copy of the vaccination record if obtained from local pharmacy or Health Dept. Verbalized acceptance and understanding.  Pneumococcal vaccine status: Due, Education has been provided regarding the importance of this vaccine. Advised may receive this vaccine at local pharmacy or Health Dept. Aware to provide a copy of the vaccination record if obtained from local pharmacy or Health Dept. Verbalized acceptance and understanding.  Covid-19 vaccine status: Completed vaccines  Qualifies for Shingles Vaccine? Yes   Zostavax completed No   Shingrix Completed?: No.    Education has been provided regarding the importance of this vaccine. Patient has been advised to call insurance company to determine out of pocket expense if they have not yet received this vaccine. Advised may also receive vaccine at local pharmacy or Health Dept. Verbalized acceptance and understanding.  Screening Tests Health Maintenance  Topic Date Due   COLONOSCOPY (Pts 45-74yr Insurance coverage will need to be confirmed)  Never done   Lung Cancer Screening  Never done   COVID-19 Vaccine (3 - Pfizer risk series) 05/29/2022 (Originally 11/28/2020)   Zoster Vaccines- Shingrix (1 of 2) 08/13/2022 (Originally 07/05/1970)   INFLUENZA VACCINE  10/18/2022 (Originally 02/17/2022)   Pneumonia  Vaccine 24+ Years old (1 - PCV) 05/14/2023 (Originally 07/05/2016)   TETANUS/TDAP  01/05/2023   Medicare Annual Wellness (AWV)  06/13/2023    MAMMOGRAM  02/03/2024   DEXA SCAN  Completed   Hepatitis C Screening  Completed   HPV VACCINES  Aged Out    Health Maintenance  Health Maintenance Due  Topic Date Due   COLONOSCOPY (Pts 45-55yr Insurance coverage will need to be confirmed)  Never done   Lung Cancer Screening  Never done    Colorectal cancer screening: Referral to GI placed 05/13/22. Pt aware the office will call re: appt.  Mammogram status: Completed 02/02/22. Repeat every year  Bone Density status: Completed 02/02/22. Results reflect: Bone density results: OSTEOPOROSIS. Repeat every   years.  Lung Cancer Screening: (Low Dose CT Chest recommended if Age 71-80years, 30 pack-year currently smoking OR have quit w/in 15years.) does not qualify.     Additional Screening:  Hepatitis C Screening: does qualify; Completed 11/28/21  Vision Screening: Recommended annual ophthalmology exams for early detection of glaucoma and other disorders of the eye. Is the patient up to date with their annual eye exam?  Yes  Who is the provider or what is the name of the office in which the patient attends annual eye exams? [facility]If pt is not established with a provider, would they like to be referred to a provider to establish care? No .   Dental Screening: Recommended annual dental exams for proper oral hygiene  Community Resource Referral / Chronic Care Management:  CRR required this visit?  No   CCM required this visit?  No      Plan:     I have personally reviewed and noted the following in the patient's chart:   Medical and social history Use of alcohol, tobacco or illicit drugs  Current medications and supplements including opioid prescriptions. Patient is not currently taking opioid prescriptions. Functional ability and status Nutritional status Physical activity Advanced directives List of other physicians Hospitalizations, surgeries, and ER visits in previous 12 months Vitals Screenings to  include cognitive, depression, and falls Referrals and appointments  In addition, I have reviewed and discussed with patient certain preventive protocols, quality metrics, and best practice recommendations. A written personalized care plan for preventive services as well as general preventive health recommendations were provided to patient.     BCriselda Peaches LPN   [id number]  Nurse Notes: Patient request refill Depakote 500 mg

## 2022-05-13 NOTE — Patient Instructions (Addendum)
[name] , Thank you for taking time to come for your Medicare Wellness Visit. I appreciate your ongoing commitment to your health goals. Please review the following plan we discussed and let me know if I can assist you in the future.   These are the goals we discussed:  Goals       In home care needs      Care Coordination Interventions: Phone call to patient's daughter to discuss options for in home care for patient Patient's condition progressing-additional in home supports needed Confirmed that patient receives food stamps and assistance through [facility] care services discussed as well as mental health follow up Option of FMLA for daughter also discussed as an option Personal care Services through [place] discussed- request form to be sent to patient's provider to complete Mental health resources to be emailed to patient's daughter for medication management -[email or web address] Solution-Focused Strategies employed:  Active listening / Reflection utilized  Emotional Support Provided Caregiver stress acknowledged        No current goals (pt-stated)        This is a list of the screening recommended for you and due dates:  Health Maintenance  Topic Date Due   Colon Cancer Screening  Never done   Screening for Lung Cancer  Never done   COVID-19 Vaccine (3 - Pfizer risk series) 05/29/2022*   Zoster (Shingles) Vaccine (1 of 2) 08/13/2022*   Flu Shot  10/18/2022*   Pneumonia Vaccine (1 - PCV) 05/14/2023*   Tetanus Vaccine  01/05/2023   Medicare Annual Wellness Visit  06/13/2023   Mammogram  02/03/2024   DEXA scan (bone density measurement)  Completed   Hepatitis C Screening: USPSTF Recommendation to screen - Ages 26-79 yo.  Completed   HPV Vaccine  Aged Out  *Topic was postponed. The date shown is not the original due date.    Advanced directives:  Advance directive discussed with you today. Even though you declined this today, please call our  office should you change your mind, and we can give you the proper paperwork for you to fill out.   Conditions/risks identified: None  Next appointment: Follow up in one year for your annual wellness visit     Preventive Care 65 Years and Older, Female Preventive care refers to lifestyle choices and visits with your health care provider that can promote health and wellness. What does preventive care include? A yearly physical exam. This is also called an annual well check. Dental exams once or twice a year. Routine eye exams. Ask your health care provider how often you should have your eyes checked. Personal lifestyle choices, including: Daily care of your teeth and gums. Regular physical activity. Eating a healthy diet. Avoiding tobacco and drug use. Limiting alcohol use. Practicing safe sex. Taking low-dose aspirin every day. Taking vitamin and mineral supplements as recommended by your health care provider. What happens during an annual well check? The services and screenings done by your health care provider during your annual well check will depend on your age, overall health, lifestyle risk factors, and family history of disease. Counseling  Your health care provider may ask you questions about your: Alcohol use. Tobacco use. Drug use. Emotional well-being. Home and relationship well-being. Sexual activity. Eating habits. History of falls. Memory and ability to understand (cognition). Work and work Statistician. Reproductive health. Screening  You may have the following tests or measurements: Height, weight, and BMI. Blood pressure. Lipid and cholesterol levels. These may be checked  every 5 years, or more frequently if you are over 83 years old. Skin check. Lung cancer screening. You may have this screening every year starting at age 74 if you have a 30-pack-year history of smoking and currently smoke or have quit within the past 15 years. Fecal occult blood test  (FOBT) of the stool. You may have this test every year starting at age 74. Flexible sigmoidoscopy or colonoscopy. You may have a sigmoidoscopy every 5 years or a colonoscopy every 10 years starting at age 68. Hepatitis C blood test. Hepatitis B blood test. Sexually transmitted disease (STD) testing. Diabetes screening. This is done by checking your blood sugar (glucose) after you have not eaten for a while (fasting). You may have this done every 1-3 years. Bone density scan. This is done to screen for osteoporosis. You may have this done starting at age 19. Mammogram. This may be done every 1-2 years. Talk to your health care provider about how often you should have regular mammograms. Talk with your health care provider about your test results, treatment options, and if necessary, the need for more tests. Vaccines  Your health care provider may recommend certain vaccines, such as: Influenza vaccine. This is recommended every year. Tetanus, diphtheria, and acellular pertussis (Tdap, Td) vaccine. You may need a Td booster every 10 years. Zoster vaccine. You may need this after age 33. Pneumococcal 13-valent conjugate (PCV13) vaccine. One dose is recommended after age 66. Pneumococcal polysaccharide (PPSV23) vaccine. One dose is recommended after age 84. Talk to your health care provider about which screenings and vaccines you need and how often you need them. This information is not intended to replace advice given to you by your health care provider. Make sure you discuss any questions you have with your health care provider. Document Released: 08/02/2015 Document Revised: 03/25/2016 Document Reviewed: 05/07/2015 Elsevier Interactive Patient Education  2017 [place] Prevention in the Home Falls can cause injuries. They can happen to people of all ages. There are many things you can do to make your home safe and to help prevent falls. What can I do on the outside of my  home? Regularly fix the edges of walkways and driveways and fix any cracks. Remove anything that might make you trip as you walk through a door, such as a raised step or threshold. Trim any bushes or trees on the path to your home. Use bright outdoor lighting. Clear any walking paths of anything that might make someone trip, such as rocks or tools. Regularly check to see if handrails are loose or broken. Make sure that both sides of any steps have handrails. Any raised decks and porches should have guardrails on the edges. Have any leaves, snow, or ice cleared regularly. Use sand or salt on walking paths during winter. Clean up any spills in your garage right away. This includes oil or grease spills. What can I do in the bathroom? Use night lights. Install grab bars by the toilet and in the tub and shower. Do not use towel bars as grab bars. Use non-skid mats or decals in the tub or shower. If you need to sit down in the shower, use a plastic, non-slip stool. Keep the floor dry. Clean up any water that spills on the floor as soon as it happens. Remove soap buildup in the tub or shower regularly. Attach bath mats securely with double-sided non-slip rug tape. Do not have throw rugs and other things on the floor that can  make you trip. What can I do in the bedroom? Use night lights. Make sure that you have a light by your bed that is easy to reach. Do not use any sheets or blankets that are too big for your bed. They should not hang down onto the floor. Have a firm chair that has side arms. You can use this for support while you get dressed. Do not have throw rugs and other things on the floor that can make you trip. What can I do in the kitchen? Clean up any spills right away. Avoid walking on wet floors. Keep items that you use a lot in easy-to-reach places. If you need to reach something above you, use a strong step stool that has a grab bar. Keep electrical cords out of the way. Do  not use floor polish or wax that makes floors slippery. If you must use wax, use non-skid floor wax. Do not have throw rugs and other things on the floor that can make you trip. What can I do with my stairs? Do not leave any items on the stairs. Make sure that there are handrails on both sides of the stairs and use them. Fix handrails that are broken or loose. Make sure that handrails are as long as the stairways. Check any carpeting to make sure that it is firmly attached to the stairs. Fix any carpet that is loose or worn. Avoid having throw rugs at the top or bottom of the stairs. If you do have throw rugs, attach them to the floor with carpet tape. Make sure that you have a light switch at the top of the stairs and the bottom of the stairs. If you do not have them, ask someone to add them for you. What else can I do to help prevent falls? Wear shoes that: Do not have high heels. Have rubber bottoms. Are comfortable and fit you well. Are closed at the toe. Do not wear sandals. If you use a stepladder: Make sure that it is fully opened. Do not climb a closed stepladder. Make sure that both sides of the stepladder are locked into place. Ask someone to hold it for you, if possible. Clearly mark and make sure that you can see: Any grab bars or handrails. First and last steps. Where the edge of each step is. Use tools that help you move around (mobility aids) if they are needed. These include: Canes. Walkers. Scooters. Crutches. Turn on the lights when you go into a dark area. Replace any light bulbs as soon as they burn out. Set up your furniture so you have a clear path. Avoid moving your furniture around. If any of your floors are uneven, fix them. If there are any pets around you, be aware of where they are. Review your medicines with your doctor. Some medicines can make you feel dizzy. This can increase your chance of falling. Ask your doctor what other things that you can do to  help prevent falls. This information is not intended to replace advice given to you by your health care provider. Make sure you discuss any questions you have with your health care provider. Document Released: 05/02/2009 Document Revised: 12/12/2015 Document Reviewed: 08/10/2014 Elsevier Interactive Patient Education  2017 [employer].

## 2022-05-14 ENCOUNTER — Ambulatory Visit (INDEPENDENT_AMBULATORY_CARE_PROVIDER_SITE_OTHER): Payer: Medicare Other | Admitting: Surgery

## 2022-05-14 ENCOUNTER — Encounter: Payer: Self-pay | Admitting: Gastroenterology

## 2022-05-14 ENCOUNTER — Encounter: Payer: Self-pay | Admitting: Surgery

## 2022-05-14 VITALS — BP 132/86 | HR 99 | Temp 98.4°F | Ht 64.0 in | Wt 193.4 lb

## 2022-05-14 DIAGNOSIS — R1011 Right upper quadrant pain: Secondary | ICD-10-CM

## 2022-05-14 DIAGNOSIS — R1904 Left lower quadrant abdominal swelling, mass and lump: Secondary | ICD-10-CM

## 2022-05-14 DIAGNOSIS — E669 Obesity, unspecified: Secondary | ICD-10-CM

## 2022-05-14 NOTE — Patient Instructions (Addendum)
Your Ultrasound is scheduled for 05/20/2022 at 8:45 am (arrive by 8:30 am) [facility] [address]. Nothing to eat or drink 6-8 hours prior.   Please get labs done at [facility]. You do not need an appointment.  If you have any concerns or questions, please feel free to call our office. We will call you with the results.

## 2022-05-15 ENCOUNTER — Ambulatory Visit: Payer: Self-pay | Admitting: *Deleted

## 2022-05-15 NOTE — Patient Outreach (Signed)
  Care Coordination   05/15/2022 Name: Latarra Eagleton MRN: [id number] DOB: 19-Jun-1951   Care Coordination Outreach Attempts:  An unsuccessful telephone outreach was attempted for a scheduled appointment today.  Follow Up Plan:  Additional outreach attempts will be made to offer the patient care coordination information and services.   Encounter Outcome:  No Answer  Care Coordination Interventions Activated:  No   Care Coordination Interventions:  No, not indicated    Endrit Gittins, [place] Worker  [facility] Care Management [phone]

## 2022-05-19 NOTE — Patient Instructions (Signed)
Visit Information  Thank you for taking time to visit with me today. Please don't hesitate to contact me if I can be of assistance to you.   Following are the goals we discussed today:   Goals Addressed             This Visit's Progress    In home care needs       Care Coordination Interventions: Return phone call from patient's daughter to discuss options for in home care for patient Patient's condition progressing-additional in home supports needed-specifically personal care services Confirmed that patient's daughter is primary caregiver, however will need additional services once she returns to work Personal care services discussed as well as mental health follow up-request form for personal care services re-faxed to provider  Mental health resources to be emailed again to patient's daughter for medication management -[email or web address]-email updated Solution-Focused Strategies employed:  Active listening / Reflection utilized  Emotional Support Provided Caregiver stress acknowledged          Our next appointment is by telephone on 05/22/22 at 11:30am  Please call the care guide team at [phone] if you need to cancel or reschedule your appointment.   If you are experiencing a Mental Health or [place] or need someone to talk to, please call the Suicide and Crisis Lifeline: 988 call 911   Patient verbalizes understanding of instructions and care plan provided today and agrees to view in [place]. Active MyChart status and patient understanding of how to access instructions and care plan via MyChart confirmed with patient.     Telephone follow up appointment with care management team member scheduled for: 05/22/22  Elliot Gurney, [place] Worker  [facility] Care Management [phone]

## 2022-05-19 NOTE — Patient Outreach (Signed)
  Care Coordination   Follow Up Visit Note   05/19/2022 Name: Madilynne Mullan MRN: [id number] DOB: [birth year]  Kimba Lottes is a 71 y.o. year old female who sees Eugenia Pancoast, FNP for primary care. I spoke with  Charlie Onstad's daughter by phone today.  What matters to the patients health and wellness today?  Personal care services    Goals Addressed             This Visit's Progress    In home care needs       Care Coordination Interventions: Return phone call from patient's daughter to discuss options for in home care for patient Patient's condition progressing-additional in home supports needed-specifically personal care services Confirmed that patient's daughter is primary caregiver, however will need additional services once she returns to work Personal care services discussed as well as mental health follow up-request form for personal care services re-faxed to provider  Mental health resources to be emailed again to patient's daughter for medication management -[email or web address]-email updated Solution-Focused Strategies employed:  Active listening / Reflection utilized  Emotional Support Provided Caregiver stress acknowledged          SDOH assessments and interventions completed:  No     Care Coordination Interventions Activated:  Yes  Care Coordination Interventions:  Yes, provided   Follow up plan: Follow up call scheduled for 05/22/22    Encounter Outcome:  Pt. Visit Completed

## 2022-05-20 ENCOUNTER — Ambulatory Visit: Payer: Medicare Other | Attending: Surgery

## 2022-05-22 ENCOUNTER — Emergency Department
Admission: EM | Admit: 2022-05-22 | Discharge: 2022-05-23 | Disposition: A | Discharge status: Other Acute Inpatient Hospital | Payer: Medicare Other | Attending: Emergency Medicine | Admitting: Emergency Medicine

## 2022-05-22 ENCOUNTER — Ambulatory Visit: Payer: Self-pay | Admitting: *Deleted

## 2022-05-22 ENCOUNTER — Telehealth: Payer: Self-pay | Admitting: *Deleted

## 2022-05-22 ENCOUNTER — Encounter: Payer: Self-pay | Admitting: Emergency Medicine

## 2022-05-22 ENCOUNTER — Other Ambulatory Visit: Payer: Self-pay

## 2022-05-22 DIAGNOSIS — F3110 Bipolar disorder, current episode manic without psychotic features, unspecified: Secondary | ICD-10-CM | POA: Diagnosis not present

## 2022-05-22 DIAGNOSIS — R443 Hallucinations, unspecified: Secondary | ICD-10-CM | POA: Diagnosis present

## 2022-05-22 DIAGNOSIS — Z20822 Contact with and (suspected) exposure to covid-19: Secondary | ICD-10-CM | POA: Diagnosis not present

## 2022-05-22 DIAGNOSIS — F250 Schizoaffective disorder, bipolar type: Secondary | ICD-10-CM | POA: Diagnosis present

## 2022-05-22 DIAGNOSIS — J449 Chronic obstructive pulmonary disease, unspecified: Secondary | ICD-10-CM | POA: Insufficient documentation

## 2022-05-22 DIAGNOSIS — F309 Manic episode, unspecified: Secondary | ICD-10-CM | POA: Diagnosis not present

## 2022-05-22 DIAGNOSIS — F29 Unspecified psychosis not due to a substance or known physiological condition: Secondary | ICD-10-CM | POA: Diagnosis not present

## 2022-05-22 DIAGNOSIS — F23 Brief psychotic disorder: Secondary | ICD-10-CM | POA: Diagnosis not present

## 2022-05-22 DIAGNOSIS — F3174 Bipolar disorder, in full remission, most recent episode manic: Secondary | ICD-10-CM | POA: Insufficient documentation

## 2022-05-22 DIAGNOSIS — N189 Chronic kidney disease, unspecified: Secondary | ICD-10-CM | POA: Diagnosis not present

## 2022-05-22 DIAGNOSIS — R000 Tachycardia, unspecified: Secondary | ICD-10-CM | POA: Diagnosis not present

## 2022-05-22 LAB — CBC
HCT: 39.4 % (ref 36.0–46.0)
Hemoglobin: 13.3 g/dL (ref 12.0–15.0)
MCH: 29.4 pg (ref 26.0–34.0)
MCHC: 33.8 g/dL (ref 30.0–36.0)
MCV: 87 fL (ref 80.0–100.0)
Platelets: 314 10*3/uL (ref 150–400)
RBC: 4.53 MIL/uL (ref 3.87–5.11)
RDW: 14.3 % (ref 11.5–15.5)
WBC: 8.3 10*3/uL (ref 4.0–10.5)
nRBC: 0 % (ref 0.0–0.2)

## 2022-05-22 LAB — COMPREHENSIVE METABOLIC PANEL
ALT: 23 U/L (ref 0–44)
AST: 28 U/L (ref 15–41)
Albumin: 5 g/dL (ref 3.5–5.0)
Alkaline Phosphatase: 39 U/L (ref 38–126)
Anion gap: 9 (ref 5–15)
BUN: 20 mg/dL (ref 8–23)
CO2: 25 mmol/L (ref 22–32)
Calcium: 10.2 mg/dL (ref 8.9–10.3)
Chloride: 106 mmol/L (ref 98–111)
Creatinine, Ser: 1.41 mg/dL — ABNORMAL HIGH (ref 0.44–1.00)
GFR, Estimated: 40 mL/min — ABNORMAL LOW (ref >60)
Glucose, Bld: 134 mg/dL — ABNORMAL HIGH (ref 70–99)
Potassium: 4.1 mmol/L (ref 3.5–5.1)
Sodium: 140 mmol/L (ref 135–145)
Total Bilirubin: 1 mg/dL (ref 0.3–1.2)
Total Protein: 8.5 g/dL — ABNORMAL HIGH (ref 6.5–8.1)

## 2022-05-22 LAB — ETHANOL: Alcohol, Ethyl (B): <10 mg/dL (ref <10)

## 2022-05-22 LAB — SALICYLATE LEVEL: Salicylate Lvl: <7 mg/dL — ABNORMAL LOW (ref 7.0–30.0)

## 2022-05-22 LAB — ACETAMINOPHEN LEVEL: Acetaminophen (Tylenol), Serum: <10 ug/mL — ABNORMAL LOW (ref 10–30)

## 2022-05-22 LAB — VALPROIC ACID LEVEL: Valproic Acid Lvl: 54 ug/mL (ref 50.0–100.0)

## 2022-05-22 LAB — TROPONIN I (HIGH SENSITIVITY): Troponin I (High Sensitivity): 10 ng/L (ref <18)

## 2022-05-22 MED ORDER — DOXYLAMINE SUCCINATE (SLEEP) 25 MG PO TABS
25.0000 mg | ORAL_TABLET | Freq: Every day | ORAL | Status: DC
  Administered 2022-05-22: 25 mg via ORAL
  Filled 2022-05-22: qty 1

## 2022-05-22 MED ORDER — DIVALPROEX SODIUM 500 MG PO DR TAB
500.0000 mg | DELAYED_RELEASE_TABLET | Freq: Two times a day (BID) | ORAL | Status: DC
  Administered 2022-05-22 – 2022-05-23 (×2): 500 mg via ORAL
  Filled 2022-05-22 (×2): qty 1

## 2022-05-22 MED ORDER — OLANZAPINE 5 MG PO TABS
15.0000 mg | ORAL_TABLET | Freq: Every day | ORAL | Status: DC
  Administered 2022-05-22: 15 mg via ORAL
  Filled 2022-05-22: qty 1

## 2022-05-22 MED ORDER — LOSARTAN POTASSIUM 50 MG PO TABS
50.0000 mg | ORAL_TABLET | Freq: Every day | ORAL | Status: DC
  Administered 2022-05-22 – 2022-05-23 (×2): 50 mg via ORAL
  Filled 2022-05-22 (×2): qty 1

## 2022-05-22 MED ORDER — PANTOPRAZOLE SODIUM 40 MG PO TBEC
40.0000 mg | DELAYED_RELEASE_TABLET | Freq: Every day | ORAL | Status: DC
  Administered 2022-05-22 – 2022-05-23 (×2): 40 mg via ORAL
  Filled 2022-05-22 (×2): qty 1

## 2022-05-22 MED ORDER — MAGNESIUM GLUCONATE 500 MG PO TABS: 500.0000 mg | ORAL_TABLET | Freq: Every evening | ORAL | Status: DC | PRN

## 2022-05-22 NOTE — ED Notes (Addendum)
Patient belongings: 1 white collared shirt 2 white t shirts 1 white tank top 1 Brayden Betters bra 1 Hasan Douse pants 2 Zully Frane socks- thrown away by daughter 1 green and blue stripped boxers 1 Terianne Thaker jacket 1 gold colored cross necklace 1 large silver colored ring 1 silver colored ring 1 Lanisa Ishler bonnet   Daughter states she will take patient's belongings at this time.
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
## 2022-05-22 NOTE — Patient Instructions (Signed)
Visit Information  Thank you for taking time to visit with me today. Please don't hesitate to contact me if I can be of assistance to you.   Following are the goals we discussed today:   Goals Addressed             This Visit's Progress    In home care needs       Care Coordination Interventions: Return phone call from patient's daughter to discuss options for in home care for patient Patient's condition progressing-additional in home supports needed-specifically personal care services Patient's daughter discussed that patient is now exhibiting manic symptoms ans she has contact Mobile Crisis for assistance Personal care services discussed as well as mental health follow up-request form for personal care services re-faxed to provider-however not yet received-will contact provider's office for alternate fax Mental health resources  emailed again to patient's daughter for medication management -[email or web address]-email updated [place]  [address] [place], [place]  [facility] [address] [place], [place] [address] [place] [address] [place], [place] [address] [phone]  Solution-Focused Strategies employed:  Active listening / Reflection utilized  Emotional Support Provided Caregiver stress acknowledged          Our next appointment is by telephone on 05/29/22 at 1:30am  Please call the care guide team at [phone] if you need to cancel or reschedule your appointment.   If you are experiencing a Mental Health or [place] or need someone to talk to, please call the Suicide and Crisis Lifeline: 988 call 911   Patient verbalizes understanding of instructions and care plan provided today and agrees to view in [place]. Active MyChart status and patient understanding of how to access instructions and care plan via MyChart  confirmed with patient.     Telephone follow up appointment with care management team member scheduled for:05/29/22  Elliot Gurney, [place] Worker  [facility] Care Management [phone]

## 2022-05-22 NOTE — Patient Outreach (Signed)
  Care Coordination   05/22/2022 Name: Shelby Jordan MRN: [id number] DOB: [birth year]   Care Coordination Outreach Attempts:  An unsuccessful telephone outreach was attempted for a scheduled appointment today.  Follow Up Plan:  Additional outreach attempts will be made to offer the patient care coordination information and services.   Encounter Outcome:  No Answer  Care Coordination Interventions Activated:  No   Care Coordination Interventions:  No, not indicated    Zariana Strub, [place] Worker  [facility] Care Management [phone]

## 2022-05-22 NOTE — ED Provider Triage Note (Signed)
Emergency Medicine Provider Triage Evaluation Note  Shelby Jordan , a 71 y.o. female  was evaluated in triage.  Pt complains of manic behavior. Has missed 2 days of meds. Patient no sleeping, speaking quickly, thinks she had her appendix removed in [place] yesterday. Has been putting pots on the stove and forgetting. No SI/HI   Review of Systems  Positive: Manic behavior Negative: SI/HI  Physical Exam  BP (!) 168/116 (BP Location: Left Arm)   Pulse (!) 110   Temp 99 F (37.2 C) (Oral)   Resp 18   Ht '5\' 4"'$  (1.626 m)   Wt 93 kg   SpO2 97%   BMI 35.19 kg/m  Gen:   Awake, no distress   Resp:  Normal effort  MSK:   Moves extremities without difficulty Other:    Medical Decision Making  Medically screening exam initiated at 4:40 PM.  Appropriate orders placed.  Shadasia Mcdiarmid was informed that the remainder of the evaluation will be completed by another provider, this initial triage assessment does not replace that evaluation, and the importance of remaining in the ED until their evaluation is complete.     Marquette Old, PA-C 05/22/22 1642

## 2022-05-22 NOTE — ED Provider Notes (Signed)
[facility] Provider Note    Event Date/Time   First MD Initiated Contact with Patient 05/22/22 1826     (approximate)   History   Chief Complaint: Psychiatric Evaluation   HPI  Shelby Jordan is a 71 y.o. female with a history of bipolar disorder, hepatitis C, CKD, COPD who is brought to the ED due to hallucinations, incoherent speech for the past 2 days.  Patient has missed her medications for the last few days, not sleeping.  She denies any acute symptoms currently.     Physical Exam   Triage Vital Signs: ED Triage Vitals  Enc Vitals Group     BP 05/22/22 1627 (!) 168/116     Pulse Rate 05/22/22 1627 (!) 110     Resp 05/22/22 1627 18     Temp 05/22/22 1627 99 F (37.2 C)     Temp Source 05/22/22 1627 Oral     SpO2 05/22/22 1627 96 %     Weight 05/22/22 1629 205 lb (93 kg)     Height 05/22/22 1629 '5\' 4"'$  (1.626 m)     Head Circumference --      Peak Flow --      Pain Score --      Pain Loc --      Pain Edu? --      Excl. in [place]? --     Most recent vital signs: Vitals:   05/22/22 1804 05/22/22 2035  BP: (!) 166/122 (!) 168/102  Pulse: (!) 109 89  Resp: 18 17  Temp:    SpO2: 98% 99%    General: Awake, no distress.  CV:  Good peripheral perfusion.  Regular rate and rhythm Resp:  Normal effort.  Clear to auscultation Abd:  No distention.  Soft nontender Other:  No lower extremity edema. Expresses delusional thoughts.  Somewhat disorganized and tangential.   ED Results / Procedures / Treatments   Labs (all labs ordered are listed, but only abnormal results are displayed) Labs Reviewed  COMPREHENSIVE METABOLIC PANEL - Abnormal; Notable for the following components:      Result Value   Glucose, Bld 134 (*)    Creatinine, Ser 1.41 (*)    Total Protein 8.5 (*)    GFR, Estimated 40 (*)    All other components within normal limits  SALICYLATE LEVEL - Abnormal; Notable for the following components:   Salicylate Lvl <2.7 (*)    All  other components within normal limits  ACETAMINOPHEN LEVEL - Abnormal; Notable for the following components:   Acetaminophen (Tylenol), Serum <10 (*)    All other components within normal limits  ETHANOL  CBC  VALPROIC ACID LEVEL  URINE DRUG SCREEN, QUALITATIVE ([facility] ONLY)  TROPONIN I (HIGH SENSITIVITY)     EKG    RADIOLOGY    PROCEDURES:  Procedures   MEDICATIONS ORDERED IN ED: Medications  divalproex (DEPAKOTE) DR tablet 500 mg (500 mg Oral Given 05/22/22 2209)  doxylamine (Sleep) (UNISOM) tablet 25 mg (25 mg Oral Given 05/22/22 2210)  losartan (COZAAR) tablet 50 mg (50 mg Oral Given 05/22/22 2210)  magnesium gluconate (MAGONATE) tablet 500 mg (has no administration in time range)  OLANZapine (ZYPREXA) tablet 15 mg (15 mg Oral Given 05/22/22 2209)  pantoprazole (PROTONIX) EC tablet 40 mg (40 mg Oral Given 05/22/22 2210)     IMPRESSION / MDM / ASSESSMENT AND PLAN / ED COURSE  I reviewed the triage vital signs and the nursing notes.  Differential diagnosis includes, but is not limited to, electrolyte abnormality, intoxication, decompensated psychotic disorder, AKI  Patient's presentation is most consistent with acute presentation with potential threat to life or bodily function.  Patient brought to ED due to bizarre behavior, apparent hallucinations at home.  Presentation is consistent with psychosis.  She is medically stable.  Seen by psychiatry who recommends inpatient treatment.       FINAL CLINICAL IMPRESSION(S) / ED DIAGNOSES   Final diagnoses:  Acute psychosis ([place])     Rx / DC Orders   ED Discharge Orders     None        Note:  This document was prepared using Dragon voice recognition software and may include unintentional dictation errors.   Carrie Mew, MD 05/22/22 [phone]

## 2022-05-22 NOTE — ED Notes (Signed)
Pt's daughter at bedside with pt

## 2022-05-22 NOTE — ED Triage Notes (Signed)
Patient to ED via POV with daughter for psych evaluation. Per daughter, patient has been hallucinations and manic for the past 2 days. Hx of same. Patient's daughter states patient missed 1 or 2 days of medications and has not been sleeping. Patient believes that she had her appendix out yesterday in [place].

## 2022-05-22 NOTE — Patient Outreach (Signed)
  Care Coordination   Follow Up Visit Note   05/22/2022 Name: Shelby Jordan MRN: [id number] DOB: [birth year]  Shelby Jordan is a 71 y.o. year old female who sees Eugenia Pancoast, FNP for primary care. I spoke with  [name] by phone today.  What matters to the patients health and wellness today?  In home assistance    Goals Addressed             This Visit's Progress    In home care needs       Care Coordination Interventions: Return phone call from patient's daughter to discuss options for in home care for patient Patient's condition progressing-additional in home supports needed-specifically personal care services Patient's daughter discussed that patient is now exhibiting manic symptoms ans she has contact Mobile Crisis for assistance Personal care services discussed as well as mental health follow up-request form for personal care services re-faxed to provider-however not yet received-will contact provider's office for alternate fax Mental health resources  emailed again to patient's daughter for medication management -[email or web address]-email updated [place]  [address] [place], [place]  [facility] [address] [place], [place] [address] [place] [address] [place], [place] [address] [phone]  Solution-Focused Strategies employed:  Active listening / Reflection utilized  Emotional Support Provided Caregiver stress acknowledged          SDOH assessments and interventions completed:  No     Care Coordination Interventions Activated:  Yes  Care Coordination Interventions:  Yes, provided   Follow up plan: Follow up call scheduled for 05/29/22    Encounter Outcome:  Pt. Visit Completed

## 2022-05-22 NOTE — ED Notes (Signed)
Assumed care at this time

## 2022-05-22 NOTE — ED Notes (Signed)
RN introduced to pt.  Pt reports she got her heart hurt. Pt reports her arteries hurt. Pt talks in complete sentences no respiratory distress noted

## 2022-05-23 DIAGNOSIS — F3110 Bipolar disorder, current episode manic without psychotic features, unspecified: Secondary | ICD-10-CM

## 2022-05-23 DIAGNOSIS — F309 Manic episode, unspecified: Secondary | ICD-10-CM

## 2022-05-23 DIAGNOSIS — F3174 Bipolar disorder, in full remission, most recent episode manic: Secondary | ICD-10-CM | POA: Diagnosis not present

## 2022-05-23 DIAGNOSIS — F250 Schizoaffective disorder, bipolar type: Secondary | ICD-10-CM

## 2022-05-23 DIAGNOSIS — F23 Brief psychotic disorder: Secondary | ICD-10-CM

## 2022-05-23 DIAGNOSIS — I10 Essential (primary) hypertension: Secondary | ICD-10-CM | POA: Diagnosis not present

## 2022-05-23 LAB — SARS CORONAVIRUS 2 BY RT PCR: SARS Coronavirus 2 by RT PCR: NEGATIVE

## 2022-05-23 MED ORDER — LORAZEPAM 0.5 MG PO TABS
0.5000 mg | ORAL_TABLET | Freq: Once | ORAL | Status: AC
  Administered 2022-05-23: 0.5 mg via ORAL
  Filled 2022-05-23: qty 1

## 2022-05-23 NOTE — ED Notes (Signed)
IVC/Rec.Inpt. Admit 

## 2022-05-23 NOTE — ED Notes (Signed)
Pt transferred to [facility]. Stable, ambulatory with W/C and in NAD. Per note in chart, daughter took all her personal belongings home.

## 2022-05-23 NOTE — ED Notes (Signed)
Patient has been accepted to [facility].  Accepting physician is Dr. Lavina Hamman.  Call report to [phone] Option 1.  Representative was Venda Rodes   ER Staff is aware of it:  Estate agent  Dr. Suzy Bouchard, ER MD  Earlie Server Patient's Nurse     Patient's [facility] [phone]) has been updated as well.

## 2022-05-23 NOTE — BH Assessment (Signed)
Referral information for Psychiatric Hospitalization faxed to;    [facility] [phone])   [facility] [phone]),   [address] [phone]),    [facility] [phone] -or- [phone]),    [facility] [phone])   [facility] [phone])   [facility] [phone])

## 2022-05-23 NOTE — ED Provider Notes (Addendum)
Patient has been accepted to [facility] facility.  Nursing informed me that patient was having loud speech being somewhat disruptive in hallway room.  Will give 0.5 mg p.o. Ativan.   Rada Hay, MD 05/23/22 [phone]    Rada Hay, MD 05/23/22 [phone]

## 2022-05-23 NOTE — ED Notes (Signed)
Pt has been accepted at [facility]. Report called in to [place], Therapist, sports. Awaiting call back to confirm that they have all they need for pt to leave.

## 2022-05-23 NOTE — ED Provider Notes (Signed)
Emergency Medicine Observation Re-evaluation Note  Liona Wengert is a 71 y.o. female, seen on rounds today.  Pt initially presented to the ED for complaints of Psychiatric Evaluation Currently, the patient is resting, voices no medical complaints.  Physical Exam  BP (!) 168/102   Pulse 89   Temp 99 F (37.2 C) (Oral)   Resp 17   Ht '5\' 4"'$  (1.626 m)   Wt 93 kg   SpO2 99%   BMI 35.19 kg/m  Physical Exam General: Resting in no acute distress Cardiac: No cyanosis Lungs: Equal rise and fall Psych: Not agitated  ED Course / MDM  EKG:   I have reviewed the labs performed to date as well as medications administered while in observation.  Recent changes in the last 24 hours include no events overnight.  Plan  Current plan is for psychiatric disposition.    Paulette Blanch, MD 05/23/22 [phone]

## 2022-05-23 NOTE — ED Notes (Signed)
Pt's bedding was changed and pt was walked to the bathroom with walker and 2 person assist and her clothes were changed and bath wipes were used on her body.

## 2022-05-23 NOTE — Consult Note (Signed)
[facility] Consult   Reason for Consult:  Psychiatric Evaluation Referring Physician:  Dr. Beather Arbour  Patient Identification: Shelby Jordan MRN:  [id number] Principal Diagnosis: Bipolar I disorder, most recent episode (or current) manic ([place]) Diagnosis:  Principal Problem:   Bipolar I disorder, most recent episode (or current) manic ([place]) Active Problems:   Schizoaffective disorder, bipolar type ([place])   Mania ([place])   Total Time spent with patient: 45 minutes  Subjective:   " I'm here for my headache"  HPI:  Psych Assessment  Shelby Jordan, 71 y.o., female patient seen by TTS and this provider; chart reviewed and consulted with Dr. Beather Arbour on 05/23/22.  On evaluation Shelby Jordan reports that she is here for a medical follow-up. Per triage note, patient to ED via POV with daughter for psych evaluation. Per daughter, patient has been hallucinations and manic for the past 2 days. Hx of same. Patient's daughter states patient missed 1 or 2 days of medications and has not been sleeping. Patient believes that she had her appendix out yesterday in [place].   Per TTS, Patient to ED via POV with daughter for psych evaluation. Per daughter, patient has been hallucinations and manic for the past 2 days. Hx of same. Patient's daughter states patient missed 1 or 2 days of medications and has not been sleeping. Patient believes that she had her appendix out yesterday in [place].  Upon assessment, Pt was alert and oriented x4. Pt had difficulty answering some questions appropriately and had some memory impairment. Pt had slightly pressured speech and flight of ideas. Pt admitted to having sleep disturbance and could identify it being a major contributor of her having to come to the ED. Pt explained that the conflict in her family is fueling her depression. Pt reported that she is medication compliant. Pt had impaired judgment and distorted reality testing. Pt had a euthymic mood and a  responsive affect. Pt denied current SI/HI. Collateral: [name] (Daughter) [phone] Per daughter pt has been paranoid and walking around with a knife and locking doors. Pt's daughter reported that the pt is hallucinating and believes that someone is saying they have a bomb. Daughter explained that the pt's mood is labile and her sleep is poor. Daughter reported that the pt paces all nights and also falls.  During evaluation Shelby Jordan is laying in the bed speaking loudly. She is alert/oriented x 3; full range /cooperative; and mood congruent with affect.  Patient is speaking in a rapidly at moderate volume, and normal pace; with good eye contact. Her thought process is incoherent and irrelevant; There is no indication that she is currently responding to internal/external stimuli.  However, she appears to be experiencing delusional thought content.  Patient denies suicidal/self-harm/homicidal ideation.     Recommendations: Inpatient Psychiatric hospiatlization  Past Psychiatric History:   Risk to Self:   Risk to Others:   Prior Inpatient Therapy:   Prior Outpatient Therapy:    Past Medical History:  Past Medical History:  Diagnosis Date   Bipolar 1 disorder ([place])    Depression    Hepatitis C    High cholesterol    Hypertension    UTI (urinary tract infection)     Past Surgical History:  Procedure Laterality Date   APPENDECTOMY     BREAST BIOPSY Right 03/04/2022   stereo bx, mass "X" clip-path pending   TOE AMPUTATION     Family History:  Family History  Problem Relation Age of Onset   Mental  illness Mother        born in mental hospital   Breast cancer Neg Hx    Family Psychiatric  History:  Social History:  Social History   Substance and Sexual Activity  Alcohol Use Never     Social History   Substance and Sexual Activity  Drug Use Never    Social History   Socioeconomic History   Marital status: Legally Separated    Spouse name: Not on file    Number of children: Not on file   Years of education: Not on file   Highest education level: Not on file  Occupational History   Not on file  Tobacco Use   Smoking status: Former    Packs/day: 1.00    Years: 30.00    Total pack years: 30.00    Types: Cigarettes    Quit date: 2023    Years since quitting: 0.8   Smokeless tobacco: Never  Vaping Use   Vaping Use: Never used  Substance and Sexual Activity   Alcohol use: Never   Drug use: Never   Sexual activity: Not Currently    Partners: Male  Other Topics Concern   Not on file  Social History Narrative   Not on file   Social Determinants of Health   Financial Resource Strain: Low Risk  (05/13/2022)   Overall Financial Resource Strain (CARDIA)    Difficulty of Paying Living Expenses: Not hard at all  Food Insecurity: No Food Insecurity (05/13/2022)   Hunger Vital Sign    Worried About Running Out of Food in the Last Year: Never true    [place] in the Last Year: Never true  Transportation Needs: No Transportation Needs (05/13/2022)   PRAPARE - Hydrologist (Medical): No    Lack of Transportation (Non-Medical): No  Physical Activity: Inactive (05/13/2022)   Exercise Vital Sign    Days of Exercise per Week: 0 days    Minutes of Exercise per Session: 0 min  Stress: No Stress Concern Present (05/13/2022)   [place]    Feeling of Stress : Not at all  Social Connections: Moderately Integrated (05/13/2022)   Social Connection and Isolation Panel [NHANES]    Frequency of Communication with Friends and Family: More than three times a week    Frequency of Social Gatherings with Friends and Family: More than three times a week    Attends Religious Services: More than 4 times per year    Active Member of [employer] or Organizations: Yes    Attends Music therapist: More than 4 times per year    Marital Status:  Separated   Additional Social History:    Allergies:   Allergies  Allergen Reactions   Tomato (Diagnostic) Other (See Comments)    Tongue break out.    Citrus Rash    Labs:  Results for orders placed or performed during the hospital encounter of 05/22/22 (from the past 48 hour(s))  Comprehensive metabolic panel     Status: Abnormal   Collection Time: 05/22/22  4:43 PM  Result Value Ref Range   Sodium 140 135 - 145 mmol/L   Potassium 4.1 3.5 - 5.1 mmol/L    Comment: HEMOLYSIS AT THIS LEVEL MAY AFFECT RESULT   Chloride 106 98 - 111 mmol/L   CO2 25 22 - 32 mmol/L   Glucose, Bld 134 (H) 70 - 99 mg/dL    Comment:  Glucose reference range applies only to samples taken after fasting for at least 8 hours.   BUN 20 8 - 23 mg/dL   Creatinine, Ser 1.41 (H) 0.44 - 1.00 mg/dL   Calcium 10.2 8.9 - 10.3 mg/dL   Total Protein 8.5 (H) 6.5 - 8.1 g/dL   Albumin 5.0 3.5 - 5.0 g/dL   AST 28 15 - 41 U/L   ALT 23 0 - 44 U/L   Alkaline Phosphatase 39 38 - 126 U/L   Total Bilirubin 1.0 0.3 - 1.2 mg/dL   GFR, Estimated 40 (L) >60 mL/min    Comment: (NOTE) Calculated using the CKD-EPI Creatinine Equation (2021)    Anion gap 9 5 - 15    Comment: Performed at [facility], [address] [place], [place] [address]  Ethanol     Status: None   Collection Time: 05/22/22  4:43 PM  Result Value Ref Range   Alcohol, Ethyl (B) <10 <10 mg/dL    Comment: (NOTE) Lowest detectable limit for serum alcohol is 10 mg/dL.  For medical purposes only. Performed at [facility], [address] [place], [place] [address]   Salicylate level     Status: Abnormal   Collection Time: 05/22/22  4:43 PM  Result Value Ref Range   Salicylate Lvl <8.6 (L) 7.0 - 30.0 mg/dL    Comment: Performed at [facility], [address] [place], [place] [address]  Acetaminophen level     Status: Abnormal   Collection Time: 05/22/22  4:43 PM  Result Value Ref Range   Acetaminophen (Tylenol), Serum  <10 (L) 10 - 30 ug/mL    Comment: (NOTE) Therapeutic concentrations vary significantly. A range of 10-30 ug/mL  may be an effective concentration for many patients. However, some  are best treated at concentrations outside of this range. Acetaminophen concentrations >150 ug/mL at 4 hours after ingestion  and >50 ug/mL at 12 hours after ingestion are often associated with  toxic reactions.  Performed at [facility], [address] [place], [place] [address]   cbc     Status: None   Collection Time: 05/22/22  4:43 PM  Result Value Ref Range   WBC 8.3 4.0 - 10.5 K/uL   RBC 4.53 3.87 - 5.11 MIL/uL   Hemoglobin 13.3 12.0 - 15.0 g/dL   HCT 39.4 36.0 - 46.0 %   MCV 87.0 80.0 - 100.0 fL   MCH 29.4 26.0 - 34.0 pg   MCHC 33.8 30.0 - 36.0 g/dL   RDW 14.3 11.5 - 15.5 %   Platelets 314 150 - 400 K/uL   nRBC 0.0 0.0 - 0.2 %    Comment: Performed at [facility], [address] [place], [place] [address]  Troponin I (High Sensitivity)     Status: None   Collection Time: 05/22/22  4:43 PM  Result Value Ref Range   Troponin I (High Sensitivity) 10 <18 ng/L    Comment: (NOTE) Elevated high sensitivity troponin I (hsTnI) values and significant  changes across serial measurements may suggest ACS but many other  chronic and acute conditions are known to elevate hsTnI results.  Refer to the "Links" section for chest pain algorithms and additional  guidance. Performed at [facility], [address] [place], [place] [address]   Valproic acid level     Status: None   Collection Time: 05/22/22  4:43 PM  Result Value Ref Range   Valproic Acid Lvl 54 50.0 - 100.0  ug/mL    Comment: Performed at [facility], [address] [place], [place] [address]  SARS Coronavirus 2 by RT PCR (hospital order, performed in [facility] hospital lab) *cepheid single result test* Anterior Nasal Swab     Status: None   Collection Time: 05/23/22 12:14 AM   Specimen:  Anterior Nasal Swab  Result Value Ref Range   SARS Coronavirus 2 by RT PCR NEGATIVE NEGATIVE    Comment: (NOTE) SARS-CoV-2 target nucleic acids are NOT DETECTED.  The SARS-CoV-2 RNA is generally detectable in upper and lower respiratory specimens during the acute phase of infection. The lowest concentration of SARS-CoV-2 viral copies this assay can detect is 250 copies / mL. A negative result does not preclude SARS-CoV-2 infection and should not be used as the sole basis for treatment or other patient management decisions.  A negative result may occur with improper specimen collection / handling, submission of specimen other than nasopharyngeal swab, presence of viral mutation(s) within the areas targeted by this assay, and inadequate number of viral copies (<250 copies / mL). A negative result must be combined with clinical observations, patient history, and epidemiological information.  Fact Sheet for Patients:   https://www.patel.info/  Fact Sheet for Healthcare Providers: https://hall.com/  This test is not yet approved or  cleared by the [place] FDA and has been authorized for detection and/or diagnosis of SARS-CoV-2 by FDA under an Emergency Use Authorization (EUA).  This EUA will remain in effect (meaning this test can be used) for the duration of the COVID-19 declaration under Section 564(b)(1) of the Act, 21 U.S.C. section 360bbb-3(b)(1), unless the authorization is terminated or revoked sooner.  Performed at [facility], [address] [place], [place] [address]     Current Facility-Administered Medications  Medication Dose Route Frequency Provider Last Rate Last Admin   divalproex (DEPAKOTE) DR tablet 500 mg  500 mg Oral BID Carrie Mew, MD   500 mg at 05/22/22 2209   doxylamine (Sleep) (UNISOM) tablet 25 mg  25 mg Oral QHS Carrie Mew, MD   25 mg at 05/22/22 2210   losartan (COZAAR) tablet 50  mg  50 mg Oral Daily Carrie Mew, MD   50 mg at 05/22/22 2210   magnesium gluconate (MAGONATE) tablet 500 mg  500 mg Oral QHS PRN Carrie Mew, MD       OLANZapine [facility]) tablet 15 mg  15 mg Oral QHS Carrie Mew, MD   15 mg at 05/22/22 2209   pantoprazole (PROTONIX) EC tablet 40 mg  40 mg Oral Daily Carrie Mew, MD   40 mg at 05/22/22 2210   Current Outpatient Medications  Medication Sig Dispense Refill   losartan (COZAAR) 50 MG tablet Take 50 mg by mouth daily.     magnesium gluconate (MAGONATE) 500 MG tablet Take 500 mg by mouth at bedtime as needed (insomnia).     OLANZapine (ZYPREXA) 15 MG tablet Take 15 mg by mouth at bedtime.     omeprazole (PRILOSEC) 20 MG capsule Take 20 mg by mouth daily.     diphenhydrAMINE (BENADRYL) 25 mg capsule Take 1 capsule (25 mg total) by mouth at bedtime as needed for sleep. 30 capsule 1   divalproex (DEPAKOTE) 500 MG DR tablet Take 1 tablet (500 mg total) by mouth 2 (two) times daily.     doxylamine, Sleep, (UNISOM) 25 MG tablet Take 25 mg by mouth at bedtime.      Musculoskeletal: Strength & Muscle Tone: within normal limits  Gait & Station: normal Patient leans: N/A  Psychiatric Specialty Exam:  Presentation  General Appearance:  Appropriate for Environment; Casual  Eye Contact: Fair  Speech: Clear and Coherent  Speech Volume: Increased  Handedness: Right   Mood and Affect  Mood: Anxious; Labile  Affect: Inappropriate; Full Range   Thought Process  Thought Processes: Disorganized; Irrevelant  Descriptions of Associations:Loose  Orientation:Partial  Thought Content:Illogical; Scattered  History of Schizophrenia/Schizoaffective disorder:Yes  Duration of Psychotic Symptoms:Greater than six months  Hallucinations:Hallucinations: Auditory; Visual  Ideas of Reference:Paranoia  Suicidal Thoughts:Suicidal Thoughts: No  Homicidal Thoughts:Homicidal Thoughts: No   Sensorium   Memory: Immediate Poor; Remote Poor  Judgment: Impaired  Insight: Lacking   Executive Functions  Concentration: Poor  Attention Span: Poor  Recall: Poor  Fund of Knowledge: Poor  Language: Poor   Psychomotor Activity  Psychomotor Activity: Psychomotor Activity: Normal   Assets  Assets: Desire for Improvement; Financial Resources/Insurance; Housing; Social Support   Sleep  Sleep: Sleep: Fair   Physical Exam: Physical Exam Vitals and nursing note reviewed.  HENT:     Head: Normocephalic and atraumatic.     Nose: Nose normal.     Mouth/Throat:     Mouth: Mucous membranes are dry.  Eyes:     Pupils: Pupils are equal, round, and reactive to light.  Pulmonary:     Effort: Pulmonary effort is normal.  Musculoskeletal:        General: Normal range of motion.     Cervical back: Normal range of motion.  Skin:    General: Skin is dry.  Neurological:     Mental Status: She is alert and oriented to person, place, and time.  Psychiatric:        Attention and Perception: Attention normal.        Mood and Affect: Mood is elated. Affect is labile.        Speech: Speech is tangential.        Behavior: Behavior is hyperactive. Behavior is cooperative.        Thought Content: Thought content is paranoid and delusional.        Cognition and Memory: Cognition is impaired. Memory is impaired.        Judgment: Judgment is impulsive and inappropriate.    Review of Systems  Psychiatric/Behavioral:  Positive for hallucinations. Negative for substance abuse and suicidal ideas.   All other systems reviewed and are negative.  Blood pressure (!) 168/102, pulse 89, temperature 99 F (37.2 C), temperature source Oral, resp. rate 17, height '5\' 4"'$  (1.626 m), weight 93 kg, SpO2 99 %. Body mass index is 35.19 kg/m.  Treatment Plan Summary: Daily contact with patient to assess and evaluate symptoms and progress in treatment and Medication management  Disposition:  Recommend psychiatric Inpatient admission when medically cleared. Supportive therapy provided about ongoing stressors. Discussed crisis plan, support from social network, calling 911, coming to the Emergency Department, and calling Suicide Hotline.  Deloria Lair, NP 05/23/2022 5:19 AM

## 2022-05-25 ENCOUNTER — Ambulatory Visit: Payer: Medicare Other | Admitting: Family

## 2022-05-25 DIAGNOSIS — E559 Vitamin D deficiency, unspecified: Secondary | ICD-10-CM | POA: Diagnosis not present

## 2022-05-25 DIAGNOSIS — Z79899 Other long term (current) drug therapy: Secondary | ICD-10-CM | POA: Diagnosis not present

## 2022-05-26 DIAGNOSIS — R296 Repeated falls: Secondary | ICD-10-CM | POA: Diagnosis not present

## 2022-05-27 DIAGNOSIS — R296 Repeated falls: Secondary | ICD-10-CM | POA: Diagnosis not present

## 2022-05-28 ENCOUNTER — Telehealth: Payer: Self-pay | Admitting: Family

## 2022-05-28 ENCOUNTER — Ambulatory Visit: Payer: Self-pay | Admitting: *Deleted

## 2022-05-28 DIAGNOSIS — R296 Repeated falls: Secondary | ICD-10-CM | POA: Diagnosis not present

## 2022-05-28 DIAGNOSIS — Z79899 Other long term (current) drug therapy: Secondary | ICD-10-CM | POA: Diagnosis not present

## 2022-05-28 NOTE — Patient Outreach (Signed)
Request form for personal care services dropped off to patient's provider's office today for completion. [facility] fax number given to return form [phone].   Elliot Gurney, [place] Worker  [facility] Care Management [phone]

## 2022-05-28 NOTE — Telephone Encounter (Signed)
Chrystal Orene Desanctis, a [facility] Social Worker, dropped off a form for tabitha to review & sign for pt. Chrystal requested form to be faxed to [phone] once completed. Chrystal's call back # is [phone]. Forms are in pcp's folder.

## 2022-05-29 ENCOUNTER — Encounter: Payer: Self-pay | Admitting: *Deleted

## 2022-05-29 DIAGNOSIS — R296 Repeated falls: Secondary | ICD-10-CM | POA: Diagnosis not present

## 2022-05-30 DIAGNOSIS — R296 Repeated falls: Secondary | ICD-10-CM | POA: Diagnosis not present

## 2022-06-01 DIAGNOSIS — Z79899 Other long term (current) drug therapy: Secondary | ICD-10-CM | POA: Diagnosis not present

## 2022-06-01 DIAGNOSIS — R296 Repeated falls: Secondary | ICD-10-CM | POA: Diagnosis not present

## 2022-06-01 DIAGNOSIS — J449 Chronic obstructive pulmonary disease, unspecified: Secondary | ICD-10-CM | POA: Diagnosis not present

## 2022-06-02 DIAGNOSIS — R296 Repeated falls: Secondary | ICD-10-CM | POA: Diagnosis not present

## 2022-06-03 DIAGNOSIS — Z79899 Other long term (current) drug therapy: Secondary | ICD-10-CM | POA: Diagnosis not present

## 2022-06-03 DIAGNOSIS — J449 Chronic obstructive pulmonary disease, unspecified: Secondary | ICD-10-CM | POA: Diagnosis not present

## 2022-06-03 DIAGNOSIS — R296 Repeated falls: Secondary | ICD-10-CM | POA: Diagnosis not present

## 2022-06-04 ENCOUNTER — Telehealth: Payer: Self-pay

## 2022-06-04 DIAGNOSIS — R296 Repeated falls: Secondary | ICD-10-CM | POA: Diagnosis not present

## 2022-06-04 NOTE — Telephone Encounter (Signed)
Patient did not call regarding no show appt.  Cancelling appt. And sending no show letter.

## 2022-06-04 NOTE — Telephone Encounter (Signed)
Unable to reach patient for no show PV. Iof unable to reach by 5pm, no show letter will be sent and procedure will be cancelled.

## 2022-06-05 ENCOUNTER — Telehealth: Payer: Self-pay | Admitting: *Deleted

## 2022-06-05 ENCOUNTER — Ambulatory Visit: Payer: Self-pay | Admitting: *Deleted

## 2022-06-05 DIAGNOSIS — R296 Repeated falls: Secondary | ICD-10-CM | POA: Diagnosis not present

## 2022-06-05 NOTE — Patient Outreach (Signed)
  Care Coordination   06/05/2022 Name: Shelby Jordan MRN: [id number] DOB: 13-Mar-1951   Care Coordination Outreach Attempts:  An unsuccessful telephone outreach was attempted for a scheduled appointment today.  Follow Up Plan:  Additional outreach attempts will be made to offer the patient care coordination information and services.   Encounter Outcome:  No Answer  Care Coordination Interventions Activated:  No   Care Coordination Interventions:  No, not indicated    Akila Batta, [place] Worker  [facility] Care Management [phone]

## 2022-06-05 NOTE — Patient Instructions (Signed)
Visit Information  Thank you for taking time to visit with me today. Please don't hesitate to contact me if I can be of assistance to you.   Following are the goals we discussed today:   Goals Addressed             This Visit's Progress    In home care needs       Care Coordination Interventions: Return phone call from patient's daughter to discuss options for in home care for patient Patient's daughter confirms that patient remains hospitalized, she will call this social worker once discharged and a follow appointment is scheduled with her providers office Per patient's provider, personal care requests forms to be completed once patient is seen in providers office        Our next appointment is by telephone on 06/19/22 at 11:30am  Please call the care guide team at [phone] if you need to cancel or reschedule your appointment.   If you are experiencing a Mental Health or [place] or need someone to talk to, please call 911   Patient verbalizes understanding of instructions and care plan provided today and agrees to view in [place]. Active MyChart status and patient understanding of how to access instructions and care plan via MyChart confirmed with patient.     Telephone follow up appointment with care management team member scheduled for: 06/19/22  Elliot Gurney, [place] Worker  [facility] Care Management [phone]

## 2022-06-05 NOTE — Patient Outreach (Signed)
  Care Coordination   Follow Up Visit Note   06/05/2022 Name: Easter Kennebrew MRN: [id number] DOB: April 13, 1951  Kerisha Goughnour is a 71 y.o. year old female who sees Eugenia Pancoast, FNP for primary care. I spoke with  [name] by phone today.  What matters to the patients health and wellness today?  Personal care services    Goals Addressed             This Visit's Progress    In home care needs       Care Coordination Interventions: Return phone call from patient's daughter to discuss options for in home care for patient Patient's daughter confirms that patient remains hospitalized, she will call this social worker once discharged and a follow appointment is scheduled with her providers office Per patient's provider, personal care requests forms to be completed once patient is seen in providers office        SDOH assessments and interventions completed:  No     Care Coordination Interventions Activated:  Yes  Care Coordination Interventions:  Yes, provided   Follow up plan: Follow up call scheduled for 06/19/22    Encounter Outcome:  Pt. Visit Completed

## 2022-06-06 DIAGNOSIS — E7220 Disorder of urea cycle metabolism, unspecified: Secondary | ICD-10-CM | POA: Diagnosis not present

## 2022-06-07 DIAGNOSIS — R296 Repeated falls: Secondary | ICD-10-CM | POA: Diagnosis not present

## 2022-06-08 DIAGNOSIS — I10 Essential (primary) hypertension: Secondary | ICD-10-CM | POA: Diagnosis not present

## 2022-06-08 DIAGNOSIS — J449 Chronic obstructive pulmonary disease, unspecified: Secondary | ICD-10-CM | POA: Diagnosis not present

## 2022-06-08 DIAGNOSIS — R059 Cough, unspecified: Secondary | ICD-10-CM | POA: Diagnosis not present

## 2022-06-08 DIAGNOSIS — Z9181 History of falling: Secondary | ICD-10-CM | POA: Diagnosis not present

## 2022-06-09 DIAGNOSIS — Z9181 History of falling: Secondary | ICD-10-CM | POA: Diagnosis not present

## 2022-06-09 DIAGNOSIS — J449 Chronic obstructive pulmonary disease, unspecified: Secondary | ICD-10-CM | POA: Diagnosis not present

## 2022-06-09 DIAGNOSIS — R296 Repeated falls: Secondary | ICD-10-CM | POA: Diagnosis not present

## 2022-06-09 DIAGNOSIS — R059 Cough, unspecified: Secondary | ICD-10-CM | POA: Diagnosis not present

## 2022-06-09 DIAGNOSIS — I10 Essential (primary) hypertension: Secondary | ICD-10-CM | POA: Diagnosis not present

## 2022-06-10 DIAGNOSIS — I10 Essential (primary) hypertension: Secondary | ICD-10-CM | POA: Diagnosis not present

## 2022-06-10 DIAGNOSIS — Z9181 History of falling: Secondary | ICD-10-CM | POA: Diagnosis not present

## 2022-06-10 DIAGNOSIS — J449 Chronic obstructive pulmonary disease, unspecified: Secondary | ICD-10-CM | POA: Diagnosis not present

## 2022-06-12 DIAGNOSIS — R059 Cough, unspecified: Secondary | ICD-10-CM | POA: Diagnosis not present

## 2022-06-12 DIAGNOSIS — I10 Essential (primary) hypertension: Secondary | ICD-10-CM | POA: Diagnosis not present

## 2022-06-12 DIAGNOSIS — J449 Chronic obstructive pulmonary disease, unspecified: Secondary | ICD-10-CM | POA: Diagnosis not present

## 2022-06-12 DIAGNOSIS — Z9181 History of falling: Secondary | ICD-10-CM | POA: Diagnosis not present

## 2022-06-15 ENCOUNTER — Ambulatory Visit: Payer: Self-pay | Admitting: *Deleted

## 2022-06-16 NOTE — Patient Instructions (Signed)
Visit Information  Thank you for taking time to visit with me today. Please don't hesitate to contact me if I can be of assistance to you.   Following are the goals we discussed today:   Goals Addressed             This Visit's Progress    In home care needs       Care Coordination Interventions: Return phone call from patient's daughter to discuss options for in home care for patient Patient's daughter confirms that patient was recently discharged from [facility], she will schedule a follow up appointment with patient's providers office, she will also re-schedule patient's appointment with GI Per patient's provider, personal care requests forms to be completed once patient is seen in providers office Contact information provided to [place] for mental health counseling and medication management        Our next appointment is by telephone on 06/19/22 at 11:30am  Please call the care guide team at [phone] if you need to cancel or reschedule your appointment.   If you are experiencing a Mental Health or [place] or need someone to talk to, please call the Suicide and Crisis Lifeline: 988   Patient verbalizes understanding of instructions and care plan provided today and agrees to view in [place]. Active MyChart status and patient understanding of how to access instructions and care plan via MyChart confirmed with patient.     Telephone follow up appointment with care management team member scheduled for: 06/19/22  Elliot Gurney, [place] Worker  [facility] Care Management [phone]

## 2022-06-16 NOTE — Patient Outreach (Signed)
  Care Coordination   Follow Up Visit Note   06/16/2022 Name: Shelby Jordan MRN: [id number] DOB: 1950/09/10  Shelby Jordan is a 70 y.o. year old female who sees Eugenia Pancoast, FNP for primary care. I spoke with  Shelby Jordan's daughter by phone today.  What matters to the patients health and wellness today?  In home care needs, mental health follow up    Goals Addressed             This Visit's Progress    In home care needs       Care Coordination Interventions: Return phone call from patient's daughter to discuss options for in home care for patient Patient's daughter confirms that patient was recently discharged from [facility], she will schedule a follow up appointment with patient's providers office, she will also re-schedule patient's appointment with GI Per patient's provider, personal care requests forms to be completed once patient is seen in providers office Contact information provided to [place] for mental health counseling and medication management        SDOH assessments and interventions completed:  No     Care Coordination Interventions:  Yes, provided   Follow up plan: Follow up call scheduled for 06/19/22    Encounter Outcome:  Pt. Visit Completed

## 2022-06-19 ENCOUNTER — Encounter: Payer: Self-pay | Admitting: *Deleted

## 2022-06-25 ENCOUNTER — Encounter: Payer: Self-pay | Admitting: Family

## 2022-06-25 ENCOUNTER — Ambulatory Visit (INDEPENDENT_AMBULATORY_CARE_PROVIDER_SITE_OTHER): Payer: Medicare Other | Admitting: Family

## 2022-06-25 VITALS — BP 126/76 | HR 82 | Temp 98.4°F | Ht 64.0 in | Wt 201.6 lb

## 2022-06-25 DIAGNOSIS — R3915 Urgency of urination: Secondary | ICD-10-CM

## 2022-06-25 DIAGNOSIS — F99 Mental disorder, not otherwise specified: Secondary | ICD-10-CM

## 2022-06-25 DIAGNOSIS — R5383 Other fatigue: Secondary | ICD-10-CM | POA: Diagnosis not present

## 2022-06-25 DIAGNOSIS — N1831 Chronic kidney disease, stage 3a: Secondary | ICD-10-CM | POA: Diagnosis not present

## 2022-06-25 DIAGNOSIS — R779 Abnormality of plasma protein, unspecified: Secondary | ICD-10-CM | POA: Diagnosis not present

## 2022-06-25 DIAGNOSIS — N3945 Continuous leakage: Secondary | ICD-10-CM | POA: Diagnosis not present

## 2022-06-25 DIAGNOSIS — R350 Frequency of micturition: Secondary | ICD-10-CM | POA: Diagnosis not present

## 2022-06-25 NOTE — Patient Instructions (Addendum)
  Call Dr. Manuella Ghazi neurology to schedule appointment or see if she has been, and inquire doe she have official diagnosis of cognitive disability?   [phone]   Also advised daughter to make appt with psychiatry and also psychology.   Please also follow up with general surgeon Dr. Christian Mate as he was waiting on ultrasound of the abdomen. With ongoing abdominal pain I would like this to be further evaluated.    Regards,   Eugenia Pancoast FNP-C

## 2022-06-25 NOTE — Progress Notes (Signed)
Established Patient Office Visit  Subjective:  Patient ID: Shelby Jordan, female    DOB: Jan 01, 1951  Age: 71 y.o. MRN: [id number]  CC:  Chief Complaint  Patient presents with   Acute Visit    [place] PPW    HPI Shelby Jordan is here today with concerns.   Was admitted to inpatient hospital behavioral health for acute psychosis, went on 11/3. Was given new Rx for medication, causes her to be sleepy and also can cause increased fatigue. Sleeping more than often with decreased appetite at time, but other times she isn't as irritated. Not currently with a psychiatrist. She is pending appt with psychiatry. She also does not have a therapist.   Was inpatient at [facility] facility.   Zyprexa is the new medciation. No longer on depakote this has been d/c . Also taking unisom at night to help with sleep.   Hypomagnesia: on magnesium 500 mg at night time.   Having increased urinary frequency and also incontinence.   Worry with decreased appetite and she doesn't take care of herself during the day. Daughter and grand daughter both work all day so she is by herself and does not prepare her own food. She sits at home watching tv all day. She isn't good at completing tasks as well, and she can not care for herself.   Needs to be redirected often, and needs to be reminded over and over needs to take shower and clean herself.   No weakness.  Forget things often.   Ct abd pelvis  Note with dr Christian Mate Scheduled u/s 11/1 which it does not look like was completed.   Wt Readings from Last 3 Encounters:  06/25/22 201 lb 9.6 oz (91.4 kg)  05/22/22 205 lb (93 kg)  05/14/22 193 lb 6.4 oz (87.7 kg)    Past Medical History:  Diagnosis Date   Bipolar 1 disorder (HCC)    Depression    Hepatitis C    High cholesterol    Hypertension    UTI (urinary tract infection)     Past Surgical History:  Procedure Laterality Date   APPENDECTOMY     BREAST BIOPSY Right 03/04/2022   stereo  bx, mass "X" clip-path pending   TOE AMPUTATION      Family History  Problem Relation Age of Onset   Mental illness Mother        born in mental hospital   Breast cancer Neg Hx     Social History   Socioeconomic History   Marital status: Legally Separated    Spouse name: Not on file   Number of children: Not on file   Years of education: Not on file   Highest education level: Not on file  Occupational History   Not on file  Tobacco Use   Smoking status: Former    Packs/day: 1.00    Years: 30.00    Total pack years: 30.00    Types: Cigarettes    Quit date: 2023    Years since quitting: 0.9   Smokeless tobacco: Never  Vaping Use   Vaping Use: Never used  Substance and Sexual Activity   Alcohol use: Never   Drug use: Never   Sexual activity: Not Currently    Partners: Male  Other Topics Concern   Not on file  Social History Narrative   Not on file   Social Determinants of Health   Financial Resource Strain: Low Risk  (05/13/2022)   Overall Emergency planning/management officer Strain (  CARDIA)    Difficulty of Paying Living Expenses: Not hard at all  Food Insecurity: No Food Insecurity (05/13/2022)   Hunger Vital Sign    Worried About Running Out of Food in the Last Year: Never true    Ran Out of Food in the Last Year: Never true  Transportation Needs: No Transportation Needs (05/13/2022)   PRAPARE - Hydrologist (Medical): No    Lack of Transportation (Non-Medical): No  Physical Activity: Inactive (05/13/2022)   Exercise Vital Sign    Days of Exercise per Week: 0 days    Minutes of Exercise per Session: 0 min  Stress: No Stress Concern Present (05/13/2022)   [place]    Feeling of Stress : Not at all  Social Connections: Moderately Integrated (05/13/2022)   Social Connection and Isolation Panel [NHANES]    Frequency of Communication with Friends and Family: More than three times a  week    Frequency of Social Gatherings with Friends and Family: More than three times a week    Attends Religious Services: More than 4 times per year    Active Member of [employer] or Organizations: Yes    Attends Archivist Meetings: More than 4 times per year    Marital Status: Separated  Intimate Partner Violence: Not At Risk (05/13/2022)   Humiliation, Afraid, Rape, and Kick questionnaire    Fear of Current or Ex-Partner: No    Emotionally Abused: No    Physically Abused: No    Sexually Abused: No    Outpatient Medications Prior to Visit  Medication Sig Dispense Refill   diphenhydrAMINE (BENADRYL) 25 mg capsule Take 1 capsule (25 mg total) by mouth at bedtime as needed for sleep. 30 capsule 1   divalproex (DEPAKOTE) 500 MG DR tablet Take 1 tablet (500 mg total) by mouth 2 (two) times daily.     doxylamine, Sleep, (UNISOM) 25 MG tablet Take 25 mg by mouth at bedtime.     losartan (COZAAR) 50 MG tablet Take 50 mg by mouth daily.     magnesium gluconate (MAGONATE) 500 MG tablet Take 500 mg by mouth at bedtime as needed (insomnia).     OLANZapine (ZYPREXA) 15 MG tablet Take 15 mg by mouth at bedtime.     omeprazole (PRILOSEC) 20 MG capsule Take 20 mg by mouth daily.     No facility-administered medications prior to visit.    Allergies  Allergen Reactions   Tomato (Diagnostic) Other (See Comments)    Tongue break out.    Citrus Rash        Objective:    Physical Exam Vitals reviewed.  Constitutional:      Appearance: Normal appearance. She is obese.  Cardiovascular:     Rate and Rhythm: Normal rate and regular rhythm.  Pulmonary:     Effort: Pulmonary effort is normal.     Breath sounds: Normal breath sounds.  Abdominal:     General: Abdomen is flat.     Tenderness: There is abdominal tenderness (slight tenderness left lower abd quadrant). There is no right CVA tenderness, left CVA tenderness or rebound.  Neurological:     Mental Status: She is alert and  oriented to person, place, and time. Mental status is at baseline.     Cranial Nerves: No cranial nerve deficit.     Motor: No weakness.     Gait: Gait normal.  Psychiatric:  Attention and Perception: She is inattentive.        Mood and Affect: Affect is flat.        Speech: Speech is delayed.        Behavior: Behavior normal. Behavior is cooperative.        Thought Content: Thought content normal. Thought content does not include homicidal or suicidal plan.        Cognition and Memory: Cognition is impaired. Memory is impaired.        Judgment: Judgment is impulsive.     BP 126/76   Pulse 82   Temp 98.4 F (36.9 C)   Ht '5\' 4"'$  (1.626 m)   Wt 201 lb 9.6 oz (91.4 kg)   SpO2 99%   BMI 34.60 kg/m  Wt Readings from Last 3 Encounters:  06/25/22 201 lb 9.6 oz (91.4 kg)  05/22/22 205 lb (93 kg)  05/14/22 193 lb 6.4 oz (87.7 kg)     Health Maintenance Due  Topic Date Due   COLONOSCOPY (Pts 45-20yr Insurance coverage will need to be confirmed)  Never done   Lung Cancer Screening  Never done   COVID-19 Vaccine (3 - Pfizer risk series) 11/28/2020    There are no preventive care reminders to display for this patient.  Lab Results  Component Value Date   TSH 1.09 03/02/2022   Lab Results  Component Value Date   WBC 8.3 05/22/2022   HGB 13.3 05/22/2022   HCT 39.4 05/22/2022   MCV 87.0 05/22/2022   PLT 314 05/22/2022   Lab Results  Component Value Date   NA 140 05/22/2022   K 4.1 05/22/2022   CO2 25 05/22/2022   GLUCOSE 134 (H) 05/22/2022   BUN 20 05/22/2022   CREATININE 1.41 (H) 05/22/2022   BILITOT 1.0 05/22/2022   ALKPHOS 39 05/22/2022   AST 28 05/22/2022   ALT 23 05/22/2022   PROT 8.5 (H) 05/22/2022   ALBUMIN 5.0 05/22/2022   CALCIUM 10.2 05/22/2022   ANIONGAP 9 05/22/2022   GFR 37.73 (L) 04/06/2022   Lab Results  Component Value Date   HGBA1C 6.3 11/28/2021      Assessment & Plan:   Problem List Items Addressed This Visit        Genitourinary   Chronic kidney disease, stage 3a ([place]   Relevant Orders   Comprehensive metabolic panel   Urine Culture   Urinalysis, Routine w reflex microscopic     Other   Other fatigue   Relevant Orders   CBC with Differential   Abnormal MMSE    Again advised daughter and granddaughter to make appt with neurology in terms of cognitive decline.  Pt is unable to care for herself fully at this time due to memory as well as behavioraldiagnoses. She often forgets to groom herself and does not feed herself or eat without redirection and or someone prepping her food.       Urinary urgency    poct urine dip in office Urine culture ordered pending results       Continuous leakage of urine - Primary   Elevated blood protein   Urinary frequency   Relevant Orders   Urine Culture   Urinalysis, Routine w reflex microscopic    No orders of the defined types were placed in this encounter.   Follow-up: Return in about 1 month (around 07/26/2022) for f/u anxiety.    TEugenia Pancoast FNP

## 2022-06-27 DIAGNOSIS — R350 Frequency of micturition: Secondary | ICD-10-CM | POA: Insufficient documentation

## 2022-06-27 NOTE — Assessment & Plan Note (Signed)
poct urine dip in office Urine culture ordered pending results  

## 2022-06-27 NOTE — Assessment & Plan Note (Signed)
Again advised daughter and granddaughter to make appt with neurology in terms of cognitive decline.  Pt is unable to care for herself fully at this time due to memory as well as behavioraldiagnoses. She often forgets to groom herself and does not feed herself or eat without redirection and or someone prepping her food.

## 2022-06-28 ENCOUNTER — Encounter: Payer: Self-pay | Admitting: Family

## 2022-06-28 DIAGNOSIS — Z741 Need for assistance with personal care: Secondary | ICD-10-CM | POA: Insufficient documentation

## 2022-06-28 DIAGNOSIS — Z9189 Other specified personal risk factors, not elsewhere classified: Secondary | ICD-10-CM | POA: Insufficient documentation

## 2022-06-29 ENCOUNTER — Ambulatory Visit: Payer: Self-pay | Admitting: *Deleted

## 2022-06-29 NOTE — Patient Outreach (Signed)
  Care Coordination   Follow Up Visit Note   06/29/2022 Name: Shelby Jordan MRN: [id number] DOB: February 27, 1951  Shelby Jordan is a 71 y.o. year old female who sees Eugenia Pancoast, FNP for primary care. I spoke with  Shelby Jordan's daughter by phone today.  What matters to the patients health and wellness today?  In home care/mental health follow up   Goals Addressed             This Visit's Progress    In home care needs       Care Coordination Interventions: Follow up phone call to patient's daughter to discuss options for in home care for patient Patient's daughter confirms that patient was recently discharged from [facility], she had a phone visit scheduled with Beverly Sessions, however due to daughter's work schedule shew was not able to keep appointment Additional resource for [facility] health provided for possible follow up if Beverly Sessions is no longer an options [phone] Per patient's daughter, personal care requests forms have not been completed by provider This social worker to continue to follow to coordinate community resource needs        SDOH assessments and interventions completed:  No     Care Coordination Interventions:  Yes, provided   Follow up plan: Follow up call scheduled for 07/14/22    Encounter Outcome:  Pt. Visit Completed

## 2022-06-29 NOTE — Patient Instructions (Signed)
Visit Information  Thank you for taking time to visit with me today. Please don't hesitate to contact me if I can be of assistance to you.   Following are the goals we discussed today:   Goals Addressed             This Visit's Progress    In home care needs       Care Coordination Interventions: Follow up phone call to patient's daughter to discuss options for in home care for patient Patient's daughter confirms that patient was recently discharged from [facility], she had a phone visit scheduled with Beverly Sessions, however due to daughter's work schedule shew was not able to keep appointment Additional resource for [facility] health provided for possible follow up if Beverly Sessions is no longer an options [phone] Per patient's daughter, personal care requests forms have not been completed by provider This social worker to continue to follow to coordinate community resource needs        Our next appointment is by telephone on 07/14/22 at 1pm  Please call the care guide team at [phone] if you need to cancel or reschedule your appointment.   If you are experiencing a Mental Health or [place] or need someone to talk to, please call the Suicide and Crisis Lifeline: 988   Patient verbalizes understanding of instructions and care plan provided today and agrees to view in [place]. Active MyChart status and patient understanding of how to access instructions and care plan via MyChart confirmed with patient.     Telephone follow up appointment with care management team member scheduled for: 07/14/22  Elliot Gurney, [place] Worker  [facility] Care Management [phone]

## 2022-07-02 ENCOUNTER — Encounter: Payer: Medicare Other | Admitting: Gastroenterology

## 2022-07-07 ENCOUNTER — Telehealth: Payer: Self-pay | Admitting: Family

## 2022-07-07 NOTE — Telephone Encounter (Signed)
  Encourage patient to contact the pharmacy for refills or they can request refills through [facility]  Did the patient contact the pharmacy: yes   LAST APPOINTMENT DATE:  06/25/22  NEXT APPOINTMENT DATE:  MEDICATION:OLANZapine (ZYPREXA) 15 MG tablet   -Trazodone 50 mg  Is the patient out of medication? yes  If not, how much is left?  Is this a 90 day supply:   [id number] [place] [place]: [phone] Fax: [phone]     Let patient know to contact pharmacy at the end of the day to make sure medication is ready.  Please notify patient to allow 48-72 hours to process

## 2022-07-07 NOTE — Telephone Encounter (Signed)
It looks like someone else prescribes her olanzapine 15 mg tablets. Please have the pharmacy send the refill request to the prescriber. Also, Trazodone is not on current medication list. This may also be prescribed by someone like psychiatry or neurology.

## 2022-07-10 MED ORDER — OLANZAPINE 15 MG PO TABS: 15.0000 mg | ORAL_TABLET | Freq: Every day | ORAL | 0 refills | Status: DC

## 2022-07-10 MED ORDER — TRAZODONE HCL 50 MG PO TABS: 50.0000 mg | ORAL_TABLET | Freq: Every day | ORAL | 0 refills | Status: DC

## 2022-07-10 NOTE — Telephone Encounter (Signed)
Patient daughter Sherlynn Carbon called in to follow up on the refill request. Relayed message below and she stated that her mom is hallucinating and up late at night walking around, and she isn't able to sleep being considered about her mom.

## 2022-07-10 NOTE — Telephone Encounter (Signed)
Spoke with patient's daughter, Sherlynn Carbon, regarding refills. Patient is not currently seeing psychiatry. Beverly Sessions was not taking new patients when daughter tried to schedule.   She was initiated on Zyprexa during her hospital stay in [place] last month, did very well as the Zyprexa helped prevent hallucinations. She has been out for 4 days and hallucinations have returned. Also needs refill of Trazodone 50 mg for sleep. This was very helpful as well.  Discussed that I will provide a 30 day supply of both medications but to follow up with PCP for further management. She agreed. Also discussed to start with 1/2 tablet of Zyprexa for several days, then increase to full tab.

## 2022-07-10 NOTE — Addendum Note (Signed)
Addended by: Pleas Koch on: 07/10/2022 03:02 PM   Modules accepted: Orders

## 2022-07-10 NOTE — Telephone Encounter (Signed)
Attempted to contact Shelby Jordan, no answer x 2.  Will attempt 1 final call to clarify medications.

## 2022-07-14 ENCOUNTER — Encounter: Payer: Self-pay | Admitting: *Deleted

## 2022-07-14 ENCOUNTER — Telehealth: Payer: Self-pay | Admitting: *Deleted

## 2022-07-14 NOTE — Patient Outreach (Signed)
  Care Coordination   07/14/2022 Name: Shelby Jordan MRN: [id number] DOB: [birth year]   Care Coordination Outreach Attempts:  An unsuccessful telephone outreach was attempted for a scheduled appointment today.  Follow Up Plan:  Additional outreach attempts will be made to offer the patient care coordination information and services.   Encounter Outcome:  No Answer   Care Coordination Interventions:  No, not indicated   Blayke Cordrey, [place] Worker  [facility] Care Management [phone]

## 2022-07-15 ENCOUNTER — Telehealth: Payer: Self-pay | Admitting: *Deleted

## 2022-07-15 NOTE — Patient Instructions (Signed)
Visit Information  Thank you for taking time to visit with me today. Please don't hesitate to contact me if I can be of assistance to you.   Following are the goals we discussed today:   Goals Addressed             This Visit's Progress    In home care needs       Care Coordination Interventions: Follow up phone call to patient's daughter stating that patient has walked away from the home and has been gone now for a couple of hours Patient's daughter states that patient may be experiencing manic symptoms as there was a delay in getting her prescriptions re-filled Patient's daughter confirms plan to call the Crisis Hotline once patient returns home so that she may be evaluated for inpatient stay possibly Additional resource previously for [facility] health provided for possible follow up if Beverly Sessions is no longer an options [phone] Personal care requests forms have been completed by patient's provider and faxed it to [facility] This social worker to follow up with [facility] schedule in home assessment for personal care services This social worker to continue to follow to coordinate community resource needs        Our next appointment is by telephone on 07/22/22 at 2pm  Please call the care guide team at [phone] if you need to cancel or reschedule your appointment.   If you are experiencing a Mental Health or [place] or need someone to talk to, please call the Suicide and Crisis Lifeline: 988   Patient verbalizes understanding of instructions and care plan provided today and agrees to view in [place]. Active MyChart status and patient understanding of how to access instructions and care plan via MyChart confirmed with patient.     Telephone follow up appointment with care management team member scheduled for: 07/22/22  Elliot Gurney, [place] Worker  [facility] Care Management [phone]

## 2022-07-15 NOTE — Patient Outreach (Signed)
  Care Coordination   Follow Up Visit Note   07/15/2022 Name: Shelby Jordan MRN: [id number] DOB: Mar 26, 1951  Shelby Jordan is a 71 y.o. year old female who sees Eugenia Pancoast, FNP for primary care. I spoke with  Shelby Jordan's daughter by phone today.  What matters to the patients health and wellness today?  Personal care services    Goals Addressed             This Visit's Progress    In home care needs       Care Coordination Interventions: Follow up phone call to patient's daughter stating that patient has walked away from the home and has been gone now for a couple of hours Patient's daughter states that patient may be experiencing manic symptoms as there was a delay in getting her prescriptions re-filled Patient's daughter confirms plan to call the Crisis Hotline once patient returns home so that she may be evaluated for inpatient stay possibly Additional resource previously for [facility] provided for possible follow up if Shelby Jordan is no longer an options [phone] Personal care requests forms have been completed by patient's provider and faxed it to [facility] This social worker to follow up with [facility] schedule in home assessment for personal care services This social worker to continue to follow to coordinate community resource needs        SDOH assessments and interventions completed:  No     Care Coordination Interventions:  Yes, provided   Follow up plan: Follow up call scheduled for 07/22/22    Encounter Outcome:  Pt. Visit Completed

## 2022-07-15 NOTE — Patient Outreach (Signed)
  Care Coordination   Follow Up Visit Note   07/15/2022 Name: Shelby Jordan MRN: [id number] DOB: [birth year]  Shelby Jordan is a 71 y.o. year old female who sees Shelby Pancoast, FNP for primary care. I spoke with  Shelby Jordan's daughter by phone today.  What matters to the patients health and wellness today?  Personal care services    Goals Addressed             This Visit's Progress    In home care needs       Care Coordination Interventions: Follow up phone call to patient's daughter stating that patient has walked away from the home and has been gone now for a couple of hours Patient exhibiting "manic symptoms" per daughter due to delay in obtaining her medications Patient now home, daughter will call the crisis line,  contact number provided [phone]        SDOH assessments and interventions completed:  No     Care Coordination Interventions:  Yes, provided   Follow up plan: Follow up call scheduled for 07/22/21    Encounter Outcome:  Pt. Visit Completed

## 2022-07-16 ENCOUNTER — Other Ambulatory Visit: Payer: Self-pay | Admitting: *Deleted

## 2022-07-16 DIAGNOSIS — R350 Frequency of micturition: Secondary | ICD-10-CM | POA: Diagnosis not present

## 2022-07-16 DIAGNOSIS — N1831 Chronic kidney disease, stage 3a: Secondary | ICD-10-CM

## 2022-07-17 LAB — URINALYSIS, ROUTINE W REFLEX MICROSCOPIC
Bilirubin Urine: NEGATIVE
Hgb urine dipstick: NEGATIVE
Ketones, ur: NEGATIVE
Leukocytes,Ua: NEGATIVE
Nitrite: NEGATIVE
RBC / HPF: NONE SEEN (ref 0–?)
Specific Gravity, Urine: <=1.005 — AB (ref 1.000–1.030)
Total Protein, Urine: NEGATIVE
Urine Glucose: NEGATIVE
Urobilinogen, UA: 0.2 (ref 0.0–1.0)
WBC, UA: NONE SEEN (ref 0–?)
pH: 6 (ref 5.0–8.0)

## 2022-07-17 LAB — URINE CULTURE
MICRO NUMBER:: 14365432
SPECIMEN QUALITY:: ADEQUATE

## 2022-07-19 ENCOUNTER — Emergency Department (EMERGENCY_DEPARTMENT_HOSPITAL)
Admission: EM | Admit: 2022-07-19 | Discharge: 2022-07-20 | Disposition: A | Discharge status: Other Acute Inpatient Hospital | Payer: Medicare Other | Source: Home / Self Care | Attending: Emergency Medicine | Admitting: Emergency Medicine

## 2022-07-19 ENCOUNTER — Other Ambulatory Visit: Payer: Self-pay

## 2022-07-19 DIAGNOSIS — I10 Essential (primary) hypertension: Secondary | ICD-10-CM | POA: Insufficient documentation

## 2022-07-19 DIAGNOSIS — Z1152 Encounter for screening for COVID-19: Secondary | ICD-10-CM | POA: Insufficient documentation

## 2022-07-19 DIAGNOSIS — F3110 Bipolar disorder, current episode manic without psychotic features, unspecified: Secondary | ICD-10-CM | POA: Diagnosis not present

## 2022-07-19 DIAGNOSIS — F319 Bipolar disorder, unspecified: Secondary | ICD-10-CM | POA: Diagnosis not present

## 2022-07-19 DIAGNOSIS — F3111 Bipolar disorder, current episode manic without psychotic features, mild: Secondary | ICD-10-CM | POA: Insufficient documentation

## 2022-07-19 LAB — COMPREHENSIVE METABOLIC PANEL
ALT: 25 U/L (ref 0–44)
AST: 41 U/L (ref 15–41)
Albumin: 4.6 g/dL (ref 3.5–5.0)
Alkaline Phosphatase: 42 U/L (ref 38–126)
Anion gap: 10 (ref 5–15)
BUN: 14 mg/dL (ref 8–23)
CO2: 23 mmol/L (ref 22–32)
Calcium: 9.6 mg/dL (ref 8.9–10.3)
Chloride: 107 mmol/L (ref 98–111)
Creatinine, Ser: 1.49 mg/dL — ABNORMAL HIGH (ref 0.44–1.00)
GFR, Estimated: 37 mL/min — ABNORMAL LOW (ref >60)
Glucose, Bld: 191 mg/dL — ABNORMAL HIGH (ref 70–99)
Potassium: 3.5 mmol/L (ref 3.5–5.1)
Sodium: 140 mmol/L (ref 135–145)
Total Bilirubin: 0.7 mg/dL (ref 0.3–1.2)
Total Protein: 8.2 g/dL — ABNORMAL HIGH (ref 6.5–8.1)

## 2022-07-19 LAB — CBC
HCT: 38.7 % (ref 36.0–46.0)
Hemoglobin: 12.8 g/dL (ref 12.0–15.0)
MCH: 28.8 pg (ref 26.0–34.0)
MCHC: 33.1 g/dL (ref 30.0–36.0)
MCV: 87 fL (ref 80.0–100.0)
Platelets: 306 10*3/uL (ref 150–400)
RBC: 4.45 MIL/uL (ref 3.87–5.11)
RDW: 13.5 % (ref 11.5–15.5)
WBC: 4.2 10*3/uL (ref 4.0–10.5)
nRBC: 0 % (ref 0.0–0.2)

## 2022-07-19 LAB — ACETAMINOPHEN LEVEL: Acetaminophen (Tylenol), Serum: <10 ug/mL — ABNORMAL LOW (ref 10–30)

## 2022-07-19 LAB — RESP PANEL BY RT-PCR (RSV, FLU A&B, COVID)  RVPGX2
Influenza A by PCR: NEGATIVE
Influenza B by PCR: NEGATIVE
Resp Syncytial Virus by PCR: NEGATIVE
SARS Coronavirus 2 by RT PCR: NEGATIVE

## 2022-07-19 LAB — ETHANOL: Alcohol, Ethyl (B): <10 mg/dL (ref <10)

## 2022-07-19 LAB — SALICYLATE LEVEL: Salicylate Lvl: <7 mg/dL — ABNORMAL LOW (ref 7.0–30.0)

## 2022-07-19 MED ORDER — IBUPROFEN 600 MG PO TABS
600.0000 mg | ORAL_TABLET | Freq: Four times a day (QID) | ORAL | Status: DC | PRN
  Administered 2022-07-19 – 2022-07-20 (×2): 600 mg via ORAL
  Filled 2022-07-19 (×2): qty 1

## 2022-07-19 MED ORDER — LOSARTAN POTASSIUM 50 MG PO TABS: 50.0000 mg | ORAL_TABLET | Freq: Every day | ORAL | Status: DC

## 2022-07-19 MED ORDER — OLANZAPINE 10 MG PO TABS
10.0000 mg | ORAL_TABLET | Freq: Two times a day (BID) | ORAL | Status: DC
  Administered 2022-07-19 – 2022-07-20 (×3): 10 mg via ORAL
  Filled 2022-07-19 (×3): qty 1

## 2022-07-19 MED ORDER — TRAZODONE HCL 50 MG PO TABS
50.0000 mg | ORAL_TABLET | Freq: Every day | ORAL | Status: DC
  Administered 2022-07-19: 50 mg via ORAL
  Filled 2022-07-19: qty 1

## 2022-07-19 MED ORDER — DIVALPROEX SODIUM 500 MG PO DR TAB: 500.0000 mg | DELAYED_RELEASE_TABLET | Freq: Two times a day (BID) | ORAL | Status: DC

## 2022-07-19 MED ORDER — DIVALPROEX SODIUM 500 MG PO DR TAB
500.0000 mg | DELAYED_RELEASE_TABLET | Freq: Two times a day (BID) | ORAL | Status: DC
  Administered 2022-07-19 – 2022-07-20 (×3): 500 mg via ORAL
  Filled 2022-07-19 (×3): qty 1

## 2022-07-19 NOTE — Consult Note (Signed)
[facility] Face-to-Face Psychiatry Consult   Reason for Consult:  mania symptoms Referring Physician:  EDP Patient Identification: Shelby Jordan MRN:  [id number] Principal Diagnosis: Bipolar I disorder, most recent episode (or current) manic ([place]) Diagnosis:  Principal Problem:   Bipolar I disorder, most recent episode (or current) manic ([place])   Total Time spent with patient: 45 minutes  Subjective:   Shelby Jordan is a 71 y.o. female patient admitted with mania.  HPI:  71 yo female with bipolar d/o with mania symptoms.  She was recently inpatient in November and then off her medications once she ran out.  On assessment, she is easily distracted and focused on getting more food to eat.  Difficult to redirect, tangential at times and stated her daughter misunderstood the "kids talking about me threatening to start a fire, you know how kids are."  She stated her sleep was "good, don't listen to my daughter, I slept to six o'clock."  Client has been to a couple of places seeking medications as she does not have an outside provider for mental health.  Medications restarted and will need inpatient to stabilize unless the medications stablize her before then.  Past Psychiatric History: bipolar d/o  Risk to Self:  yes Risk to Others:  none Prior Inpatient Therapy:  several Prior Outpatient Therapy:  none  Past Medical History:  Past Medical History:  Diagnosis Date   Bipolar 1 disorder ([place])    Depression    Hepatitis C    High cholesterol    Hypertension    UTI (urinary tract infection)     Past Surgical History:  Procedure Laterality Date   APPENDECTOMY     BREAST BIOPSY Right 03/04/2022   stereo bx, mass "X" clip-path pending   TOE AMPUTATION     Family History:  Family History  Problem Relation Age of Onset   Mental illness Mother        born in mental hospital   Breast cancer Neg Hx    Family Psychiatric  History: see above Social History:  Social History   Substance and  Sexual Activity  Alcohol Use Never     Social History   Substance and Sexual Activity  Drug Use Never    Social History   Socioeconomic History   Marital status: Legally Separated    Spouse name: Not on file   Number of children: Not on file   Years of education: Not on file   Highest education level: Not on file  Occupational History   Not on file  Tobacco Use   Smoking status: Former    Packs/day: 1.00    Years: 30.00    Total pack years: 30.00    Types: Cigarettes    Quit date: 2023    Years since quitting: 0.9   Smokeless tobacco: Never  Vaping Use   Vaping Use: Never used  Substance and Sexual Activity   Alcohol use: Never   Drug use: Never   Sexual activity: Not Currently    Partners: Male  Other Topics Concern   Not on file  Social History Narrative   Not on file   Social Determinants of Health   Financial Resource Strain: Low Risk  (05/13/2022)   Overall Financial Resource Strain (CARDIA)    Difficulty of Paying Living Expenses: Not hard at all  Food Insecurity: No Food Insecurity (05/13/2022)   Hunger Vital Sign    Worried About Running Out of Food in the Last Year: Never true  Ran Out of Food in the Last Year: Never true  Transportation Needs: No Transportation Needs (05/13/2022)   PRAPARE - Hydrologist (Medical): No    Lack of Transportation (Non-Medical): No  Physical Activity: Inactive (05/13/2022)   Exercise Vital Sign    Days of Exercise per Week: 0 days    Minutes of Exercise per Session: 0 min  Stress: No Stress Concern Present (05/13/2022)   [place]    Feeling of Stress : Not at all  Social Connections: Moderately Integrated (05/13/2022)   Social Connection and Isolation Panel [NHANES]    Frequency of Communication with Friends and Family: More than three times a week    Frequency of Social Gatherings with Friends and Family: More than  three times a week    Attends Religious Services: More than 4 times per year    Active Member of [employer] or Organizations: Yes    Attends Music therapist: More than 4 times per year    Marital Status: Separated   Additional Social History:    Allergies:   Allergies  Allergen Reactions   Tomato (Diagnostic) Other (See Comments)    Tongue break out.    Citrus Rash    Labs:  Results for orders placed or performed during the hospital encounter of 07/19/22 (from the past 48 hour(s))  Comprehensive metabolic panel     Status: Abnormal   Collection Time: 07/19/22 10:45 AM  Result Value Ref Range   Sodium 140 135 - 145 mmol/L   Potassium 3.5 3.5 - 5.1 mmol/L   Chloride 107 98 - 111 mmol/L   CO2 23 22 - 32 mmol/L   Glucose, Bld 191 (H) 70 - 99 mg/dL    Comment: Glucose reference range applies only to samples taken after fasting for at least 8 hours.   BUN 14 8 - 23 mg/dL   Creatinine, Ser 1.49 (H) 0.44 - 1.00 mg/dL   Calcium 9.6 8.9 - 10.3 mg/dL   Total Protein 8.2 (H) 6.5 - 8.1 g/dL   Albumin 4.6 3.5 - 5.0 g/dL   AST 41 15 - 41 U/L   ALT 25 0 - 44 U/L   Alkaline Phosphatase 42 38 - 126 U/L   Total Bilirubin 0.7 0.3 - 1.2 mg/dL   GFR, Estimated 37 (L) >60 mL/min    Comment: (NOTE) Calculated using the CKD-EPI Creatinine Equation (2021)    Anion gap 10 5 - 15    Comment: Performed at [facility], [address] [place], [place] [address]  Ethanol     Status: None   Collection Time: 07/19/22 10:45 AM  Result Value Ref Range   Alcohol, Ethyl (B) <10 <10 mg/dL    Comment: (NOTE) Lowest detectable limit for serum alcohol is 10 mg/dL.  For medical purposes only. Performed at [facility], [address] [place], [place] [address]   Salicylate level     Status: Abnormal   Collection Time: 07/19/22 10:45 AM  Result Value Ref Range   Salicylate Lvl <1.7 (L) 7.0 - 30.0 mg/dL    Comment: Performed at [facility], [address] [place], [place] [address]  Acetaminophen level     Status: Abnormal   Collection Time: 07/19/22 10:45 AM  Result Value Ref Range   Acetaminophen (Tylenol), Serum <10 (L) 10 - 30 ug/mL    Comment: (NOTE) Therapeutic concentrations vary significantly. A range of 10-30  ug/mL  may be an effective concentration for many patients. However, some  are best treated at concentrations outside of this range. Acetaminophen concentrations >150 ug/mL at 4 hours after ingestion  and >50 ug/mL at 12 hours after ingestion are often associated with  toxic reactions.  Performed at [facility], [address] [place], [place] [address]   cbc     Status: None   Collection Time: 07/19/22 10:45 AM  Result Value Ref Range   WBC 4.2 4.0 - 10.5 K/uL   RBC 4.45 3.87 - 5.11 MIL/uL   Hemoglobin 12.8 12.0 - 15.0 g/dL   HCT 38.7 36.0 - 46.0 %   MCV 87.0 80.0 - 100.0 fL   MCH 28.8 26.0 - 34.0 pg   MCHC 33.1 30.0 - 36.0 g/dL   RDW 13.5 11.5 - 15.5 %   Platelets 306 150 - 400 K/uL   nRBC 0.0 0.0 - 0.2 %    Comment: Performed at [facility], [address], [place], [place] [address]  Resp panel by RT-PCR (RSV, Flu A&B, Covid) Anterior Nasal Swab     Status: None   Collection Time: 07/19/22 10:47 AM   Specimen: Anterior Nasal Swab  Result Value Ref Range   SARS Coronavirus 2 by RT PCR NEGATIVE NEGATIVE    Comment: (NOTE) SARS-CoV-2 target nucleic acids are NOT DETECTED.  The SARS-CoV-2 RNA is generally detectable in upper respiratory specimens during the acute phase of infection. The lowest concentration of SARS-CoV-2 viral copies this assay can detect is 138 copies/mL. A negative result does not preclude SARS-Cov-2 infection and should not be used as the sole basis for treatment or other patient management decisions. A negative result may occur with  improper specimen collection/handling, submission of specimen other than nasopharyngeal swab, presence of viral mutation(s) within  the areas targeted by this assay, and inadequate number of viral copies(<138 copies/mL). A negative result must be combined with clinical observations, patient history, and epidemiological information. The expected result is Negative.  Fact Sheet for Patients:  EntrepreneurPulse.com.au  Fact Sheet for Healthcare Providers:  IncredibleEmployment.be  This test is no t yet approved or cleared by the [place] FDA and  has been authorized for detection and/or diagnosis of SARS-CoV-2 by FDA under an Emergency Use Authorization (EUA). This EUA will remain  in effect (meaning this test can be used) for the duration of the COVID-19 declaration under Section 564(b)(1) of the Act, 21 U.S.C.section 360bbb-3(b)(1), unless the authorization is terminated  or revoked sooner.       Influenza A by PCR NEGATIVE NEGATIVE   Influenza B by PCR NEGATIVE NEGATIVE    Comment: (NOTE) The Xpert Xpress SARS-CoV-2/FLU/RSV plus assay is intended as an aid in the diagnosis of influenza from Nasopharyngeal swab specimens and should not be used as a sole basis for treatment. Nasal washings and aspirates are unacceptable for Xpert Xpress SARS-CoV-2/FLU/RSV testing.  Fact Sheet for Patients: EntrepreneurPulse.com.au  Fact Sheet for Healthcare Providers: IncredibleEmployment.be  This test is not yet approved or cleared by the [place] FDA and has been authorized for detection and/or diagnosis of SARS-CoV-2 by FDA under an Emergency Use Authorization (EUA). This EUA will remain in effect (meaning this test can be used) for the duration of the COVID-19 declaration under Section 564(b)(1) of the Act, 21 U.S.C. section 360bbb-3(b)(1), unless the authorization is terminated or revoked.     Resp Syncytial Virus by PCR NEGATIVE NEGATIVE    Comment: (NOTE) Fact Sheet for Patients:  EntrepreneurPulse.com.au  Fact  Sheet for Healthcare Providers: IncredibleEmployment.be  This test is not yet approved or cleared by the [place] FDA and has been authorized for detection and/or diagnosis of SARS-CoV-2 by FDA under an Emergency Use Authorization (EUA). This EUA will remain in effect (meaning this test can be used) for the duration of the COVID-19 declaration under Section 564(b)(1) of the Act, 21 U.S.C. section 360bbb-3(b)(1), unless the authorization is terminated or revoked.  Performed at [facility], [address] [place],  [address]     No current facility-administered medications for this encounter.   Current Outpatient Medications  Medication Sig Dispense Refill   diphenhydrAMINE (BENADRYL) 25 mg capsule Take 1 capsule (25 mg total) by mouth at bedtime as needed for sleep. 30 capsule 1   divalproex (DEPAKOTE) 500 MG DR tablet Take 1 tablet (500 mg total) by mouth 2 (two) times daily.     doxylamine, Sleep, (UNISOM) 25 MG tablet Take 25 mg by mouth at bedtime.     losartan (COZAAR) 50 MG tablet Take 50 mg by mouth daily.     magnesium gluconate (MAGONATE) 500 MG tablet Take 500 mg by mouth at bedtime as needed (insomnia).     OLANZapine (ZYPREXA) 15 MG tablet Take 1 tablet (15 mg total) by mouth at bedtime. 30 tablet 0   omeprazole (PRILOSEC) 20 MG capsule Take 20 mg by mouth daily.     traZODone (DESYREL) 50 MG tablet Take 1 tablet (50 mg total) by mouth at bedtime. 30 tablet 0    Musculoskeletal: Strength & Muscle Tone: within normal limits Gait & Station: normal Patient leans: N/A  Psychiatric Specialty Exam: Physical Exam Vitals and nursing note reviewed.  Constitutional:      Appearance: Normal appearance.  HENT:     Nose: Nose normal.  Pulmonary:     Effort: Pulmonary effort is normal.  Musculoskeletal:        General: Normal range of motion.     Cervical back: Normal range of motion.  Neurological:     General: No focal deficit  present.     Mental Status: She is alert and oriented to person, place, and time.  Psychiatric:        Attention and Perception: She is inattentive.        Mood and Affect: Mood is anxious and elated.        Speech: Speech is tangential.        Behavior: Behavior is hyperactive.        Thought Content: Thought content normal.        Cognition and Memory: Cognition is impaired.        Judgment: Judgment is impulsive.     Review of Systems  Psychiatric/Behavioral:  The patient is nervous/anxious.   All other systems reviewed and are negative.   Blood pressure (!) 181/102, pulse (!) 115, temperature 99.7 F (37.6 C), temperature source Oral, resp. rate 18, SpO2 99 %.There is no height or weight on file to calculate BMI.  General Appearance: Casual  Eye Contact:  Fair  Speech:  Normal Rate  Volume:  Normal  Mood:  Anxious and Euphoric  Affect:  Congruent  Thought Process:  Descriptions of Associations: Tangential  Orientation:  Full (Time, Place, and Person)  Thought Content:  Tangential  Suicidal Thoughts:  No  Homicidal Thoughts:  No  Memory:  Immediate;   Fair Recent;   Fair Remote;   Fair  Judgement:  Impaired  Insight:  Lacking  Psychomotor Activity:  Increased  Concentration:  Concentration: Poor and Attention Span: Poor  Recall:  Meta of Knowledge:  Fair  Language:  Good  Akathisia:  No  Handed:  Right  AIMS (if indicated):     Assets:  Housing Leisure Time Physical Health Resilience Social Support  ADL's:  Intact  Cognition:  Impaired,  Mild  Sleep:        Physical Exam: Physical Exam Vitals and nursing note reviewed.  Constitutional:      Appearance: Normal appearance.  HENT:     Nose: Nose normal.  Pulmonary:     Effort: Pulmonary effort is normal.  Musculoskeletal:        General: Normal range of motion.     Cervical back: Normal range of motion.  Neurological:     General: No focal deficit present.     Mental Status: She is alert and  oriented to person, place, and time.  Psychiatric:        Attention and Perception: She is inattentive.        Mood and Affect: Mood is anxious and elated.        Speech: Speech is tangential.        Behavior: Behavior is hyperactive.        Thought Content: Thought content normal.        Cognition and Memory: Cognition is impaired.        Judgment: Judgment is impulsive.    Review of Systems  Psychiatric/Behavioral:  The patient is nervous/anxious.   All other systems reviewed and are negative.  Blood pressure (!) 181/102, pulse (!) 115, temperature 99.7 F (37.6 C), temperature source Oral, resp. rate 18, SpO2 99 %. There is no height or weight on file to calculate BMI.  Treatment Plan Summary: Daily contact with patient to assess and evaluate symptoms and progress in treatment, Medication management, and Plan : Bipolar disorder, mania, moderate: Depakote 500 mg BID  Zyprexa 10 mg BID  Insomnia: Trazodone 50 mg daily at bedtime   Disposition: Recommend psychiatric Inpatient admission when medically cleared.  Waylan Boga, NP 07/19/2022 12:13 PM

## 2022-07-19 NOTE — BH Assessment (Signed)
Referral information for Psychiatric Hospitalization faxed to:  [facility] [phone]),  [address] [phone]),   [facility] [phone] -or- [phone]),   [facility] [phone])  [facility] [phone] or [phone]),   [facility] [phone])

## 2022-07-19 NOTE — ED Provider Notes (Signed)
[facility] Provider Note    Event Date/Time   First MD Initiated Contact with Patient 07/19/22 1128     (approximate)   History   Psychiatric Evaluation   HPI  Shelby Jordan is a 71 y.o. female with a history of hypertension depression bipolar disorder who was brought to the ED today by her daughter due to increasingly erratic behavior.  Patient had a psychiatric hospitalization about a month ago, was discharged with a new prescription but ran out recently.  A few days ago she was able to restart medication but still having erratic, agitated behavior. To me, patient denies SI HI or hallucinations.  She denies pain or injury.  Denies any unusual phenomenon.      Physical Exam   Triage Vital Signs: ED Triage Vitals  Enc Vitals Group     BP 07/19/22 1044 (!) 181/102     Pulse Rate 07/19/22 1044 (!) 115     Resp 07/19/22 1044 18     Temp 07/19/22 1044 99.7 F (37.6 C)     Temp Source 07/19/22 1044 Oral     SpO2 07/19/22 1044 99 %     Weight --      Height --      Head Circumference --      Peak Flow --      Pain Score 07/19/22 1041 0     Pain Loc --      Pain Edu? --      Excl. in [employer]? --     Most recent vital signs: Vitals:   07/19/22 1044  BP: (!) 181/102  Pulse: (!) 115  Resp: 18  Temp: 99.7 F (37.6 C)  SpO2: 99%    General: Awake, no distress.  CV:  Good peripheral perfusion.  Resp:  Normal effort.  Abd:  No distention.  Soft nontender Other:  No lower extremity edema, no wounds or rash.  Normal speech, normal language   ED Results / Procedures / Treatments   Labs (all labs ordered are listed, but only abnormal results are displayed) Labs Reviewed  COMPREHENSIVE METABOLIC PANEL - Abnormal; Notable for the following components:      Result Value   Glucose, Bld 191 (*)    Creatinine, Ser 1.49 (*)    Total Protein 8.2 (*)    GFR, Estimated 37 (*)    All other components within normal limits  SALICYLATE LEVEL - Abnormal;  Notable for the following components:   Salicylate Lvl <1.2 (*)    All other components within normal limits  ACETAMINOPHEN LEVEL - Abnormal; Notable for the following components:   Acetaminophen (Tylenol), Serum <10 (*)    All other components within normal limits  RESP PANEL BY RT-PCR (RSV, FLU A&B, COVID)  [id number]  ETHANOL  CBC  URINE DRUG SCREEN, QUALITATIVE ([facility] ONLY)     RADIOLOGY    PROCEDURES:  Procedures   MEDICATIONS ORDERED IN ED: Medications  traZODone (DESYREL) tablet 50 mg (has no administration in time range)  OLANZapine (ZYPREXA) tablet 10 mg (10 mg Oral Given 07/19/22 1429)  ibuprofen (ADVIL) tablet 600 mg (600 mg Oral Given 07/19/22 1430)  divalproex (DEPAKOTE) DR tablet 500 mg (has no administration in time range)     IMPRESSION / MDM / ASSESSMENT AND PLAN / ED COURSE  I reviewed the triage vital signs and the nursing notes.  Differential diagnosis includes, but is not limited to, decompensated psychosis, intoxication, stimulant abuse, viral illness  Patient presents with increased agitation symptoms.  Not an imminent danger to herself or others, not committable.  She is medically stable.  Will consult psychiatry for further evaluation  The patient has been placed in psychiatric observation due to the need to provide a safe environment for the patient while obtaining psychiatric consultation and evaluation, as well as ongoing medical and medication management to treat the patient's condition.  The patient has not been placed under full IVC at this time.        FINAL CLINICAL IMPRESSION(S) / ED DIAGNOSES   Final diagnoses:  Bipolar affective disorder, currently manic, mild ([place])     Rx / DC Orders   ED Discharge Orders     None        Note:  This document was prepared using Dragon voice recognition software and may include unintentional dictation errors.   Carrie Mew, MD 07/19/22 [phone]

## 2022-07-19 NOTE — ED Triage Notes (Addendum)
Pt to ED via POV from home voluntary. Pt accompanied by daughter. Daughter reports she suffers from bipolar and schizophrenia and was admitted inpt. Pt was sent home prior to thanksgiving on new prescription. Pt has been out of prescription and daughter states she has been manic, hallucinating and wandering outside of home. Pt is currently back on medication but daughter states behavior has not changed.    When undressing pt bruising and swelling noted to left arm. Pt states it was from fall but does not remember when or how.

## 2022-07-19 NOTE — ED Notes (Signed)
Patient used the phone at 6:00pm.

## 2022-07-19 NOTE — ED Notes (Signed)
Jazmine, RN made aware of pt coming to 19h

## 2022-07-19 NOTE — ED Notes (Signed)
Patient presents manic, loud and disorganized. Constantly stopping people in hallway. Patient loud and disruptive in hallway bed. Moved to room 20.

## 2022-07-19 NOTE — ED Notes (Signed)
Patient states of "not being able to use the bathroom at this time."

## 2022-07-19 NOTE — ED Notes (Signed)
Patient demanding additional food items. Writer offered patient a sandwich tray. Patient responded " I don't want that shit."

## 2022-07-19 NOTE — ED Notes (Addendum)
Pt dressed out by this RN and Mayra, EDT:  Bag 1 of 2 (*Daughter is taken bag 1 of 2 home) 1 red and white striped shirt 1 white print collar shirt  1 black sports bra 2 black bonnet 1 pair white shoed 1 pair colored socks 1 pair black pants  1 pair black underwear 1 wallet  1 hairbow  Bag 2 of 2  1 black and red jacket  *Pt has ring on left hand that refuses to off

## 2022-07-20 ENCOUNTER — Inpatient Hospital Stay
Admission: AD | Admit: 2022-07-20 | Discharge: 2022-07-30 | DRG: 885 | Disposition: A | Discharge status: Home / Self Care | Payer: Medicare Other | Source: Intra-hospital | Attending: Psychiatry | Admitting: Psychiatry

## 2022-07-20 ENCOUNTER — Other Ambulatory Visit: Payer: Self-pay

## 2022-07-20 ENCOUNTER — Encounter: Payer: Self-pay | Admitting: Psychiatry

## 2022-07-20 DIAGNOSIS — I129 Hypertensive chronic kidney disease with stage 1 through stage 4 chronic kidney disease, or unspecified chronic kidney disease: Secondary | ICD-10-CM | POA: Diagnosis present

## 2022-07-20 DIAGNOSIS — F319 Bipolar disorder, unspecified: Principal | ICD-10-CM | POA: Diagnosis present

## 2022-07-20 DIAGNOSIS — F0393 Unspecified dementia, unspecified severity, with mood disturbance: Secondary | ICD-10-CM | POA: Diagnosis present

## 2022-07-20 DIAGNOSIS — Z87891 Personal history of nicotine dependence: Secondary | ICD-10-CM | POA: Diagnosis not present

## 2022-07-20 DIAGNOSIS — Z818 Family history of other mental and behavioral disorders: Secondary | ICD-10-CM

## 2022-07-20 DIAGNOSIS — Z635 Disruption of family by separation and divorce: Secondary | ICD-10-CM

## 2022-07-20 DIAGNOSIS — E7800 Pure hypercholesterolemia, unspecified: Secondary | ICD-10-CM | POA: Diagnosis present

## 2022-07-20 DIAGNOSIS — E1122 Type 2 diabetes mellitus with diabetic chronic kidney disease: Secondary | ICD-10-CM | POA: Diagnosis present

## 2022-07-20 DIAGNOSIS — Z20822 Contact with and (suspected) exposure to covid-19: Secondary | ICD-10-CM | POA: Diagnosis present

## 2022-07-20 DIAGNOSIS — F3110 Bipolar disorder, current episode manic without psychotic features, unspecified: Secondary | ICD-10-CM | POA: Diagnosis not present

## 2022-07-20 LAB — URINE DRUG SCREEN, QUALITATIVE (ARMC ONLY)
Amphetamines, Ur Screen: NOT DETECTED
Barbiturates, Ur Screen: NOT DETECTED
Benzodiazepine, Ur Scrn: NOT DETECTED
Cannabinoid 50 Ng, Ur ~~LOC~~: NOT DETECTED
Cocaine Metabolite,Ur ~~LOC~~: NOT DETECTED
MDMA (Ecstasy)Ur Screen: NOT DETECTED
Methadone Scn, Ur: NOT DETECTED
Opiate, Ur Screen: NOT DETECTED
Phencyclidine (PCP) Ur S: NOT DETECTED
Tricyclic, Ur Screen: NOT DETECTED

## 2022-07-20 MED ORDER — TRAZODONE HCL 50 MG PO TABS
50.0000 mg | ORAL_TABLET | Freq: Every day | ORAL | Status: DC
  Administered 2022-07-20 – 2022-07-29 (×10): 50 mg via ORAL
  Filled 2022-07-20 (×11): qty 1

## 2022-07-20 MED ORDER — OLANZAPINE 5 MG PO TABS: 2.5000 mg | ORAL_TABLET | Freq: Two times a day (BID) | ORAL | Status: DC | PRN

## 2022-07-20 MED ORDER — MAGNESIUM HYDROXIDE 400 MG/5ML PO SUSP: 30.0000 mL | Freq: Every day | ORAL | Status: DC | PRN

## 2022-07-20 MED ORDER — IBUPROFEN 200 MG PO TABS
600.0000 mg | ORAL_TABLET | Freq: Four times a day (QID) | ORAL | Status: DC | PRN
  Administered 2022-07-21 – 2022-07-27 (×2): 600 mg via ORAL
  Filled 2022-07-20 (×3): qty 3

## 2022-07-20 MED ORDER — LORAZEPAM 1 MG PO TABS
1.0000 mg | ORAL_TABLET | ORAL | Status: DC | PRN
  Administered 2022-07-21 – 2022-07-29 (×4): 1 mg via ORAL
  Filled 2022-07-20 (×4): qty 1

## 2022-07-20 MED ORDER — ENSURE ENLIVE PO LIQD: 237.0000 mL | Freq: Once | ORAL | Status: DC

## 2022-07-20 MED ORDER — OLANZAPINE 5 MG PO TABS
10.0000 mg | ORAL_TABLET | Freq: Two times a day (BID) | ORAL | Status: DC
  Administered 2022-07-20 – 2022-07-22 (×4): 10 mg via ORAL
  Filled 2022-07-20 (×4): qty 2

## 2022-07-20 MED ORDER — ACETAMINOPHEN 325 MG PO TABS: 650.0000 mg | ORAL_TABLET | Freq: Four times a day (QID) | ORAL | Status: DC | PRN

## 2022-07-20 MED ORDER — DOXYLAMINE SUCCINATE (SLEEP) 25 MG PO TABS
25.0000 mg | ORAL_TABLET | Freq: Every evening | ORAL | Status: DC | PRN
  Administered 2022-07-21 (×2): 25 mg via ORAL
  Filled 2022-07-20 (×3): qty 1

## 2022-07-20 MED ORDER — DIVALPROEX SODIUM 250 MG PO DR TAB
500.0000 mg | DELAYED_RELEASE_TABLET | Freq: Two times a day (BID) | ORAL | Status: DC
  Administered 2022-07-20 – 2022-07-30 (×20): 500 mg via ORAL
  Filled 2022-07-20 (×21): qty 2

## 2022-07-20 MED ORDER — ALUM & MAG HYDROXIDE-SIMETH 200-200-20 MG/5ML PO SUSP: 30.0000 mL | ORAL | Status: DC | PRN

## 2022-07-20 MED ORDER — LORAZEPAM 0.5 MG PO TABS: 0.5000 mg | ORAL_TABLET | Freq: Four times a day (QID) | ORAL | Status: DC | PRN

## 2022-07-20 MED ORDER — DOXYLAMINE SUCCINATE (SLEEP) 25 MG PO TABS: 25.0000 mg | ORAL_TABLET | Freq: Every evening | ORAL | Status: DC | PRN

## 2022-07-20 MED ORDER — ENSURE ENLIVE PO LIQD
237.0000 mL | Freq: Three times a day (TID) | ORAL | Status: DC
  Administered 2022-07-20 – 2022-07-21 (×2): 237 mL via ORAL

## 2022-07-20 NOTE — ED Notes (Signed)
Report Delana Meyer, RN

## 2022-07-20 NOTE — Consult Note (Signed)
[facility] Face-to-Face Psychiatry Consult   Reason for Consult:  mania symptoms Referring Physician:  EDP Patient Identification: Shelby Jordan MRN:  [id number] Principal Diagnosis: Bipolar I disorder, most recent episode (or current) manic ([place]) Diagnosis:  Principal Problem:   Bipolar I disorder, most recent episode (or current) manic ([place])   Total Time spent with patient: 30 minutes  Subjective:   Shelby Jordan is a 72 y.o. female patient admitted with mania.  Today, the client is redirectable today but has been "full force" since 3 am per nursing.  She has showered and dressed, very pleasant.  Hypomanic at this time, compliant with her medications, not at her baseline and will need a gero admission.  HPI on admission:  72 yo female with bipolar d/o with mania symptoms.  She was recently inpatient in November and then off her medications once she ran out.  On assessment, she is easily distracted and focused on getting more food to eat.  Difficult to redirect, tangential at times and stated her daughter misunderstood the "kids talking about me threatening to start a fire, you know how kids are."  She stated her sleep was "good, don't listen to my daughter, I slept to six o'clock."  Client has been to a couple of places seeking medications as she does not have an outside provider for mental health.  Medications restarted and will need inpatient to stabilize unless the medications stablize her before then.  Past Psychiatric History: bipolar d/o  Risk to Self:  yes Risk to Others:  none Prior Inpatient Therapy:  several Prior Outpatient Therapy:  none  Past Medical History:  Past Medical History:  Diagnosis Date   Bipolar 1 disorder ([place])    Depression    Hepatitis C    High cholesterol    Hypertension    UTI (urinary tract infection)     Past Surgical History:  Procedure Laterality Date   APPENDECTOMY     BREAST BIOPSY Right 03/04/2022   stereo bx, mass "X" clip-path pending    TOE AMPUTATION     Family History:  Family History  Problem Relation Age of Onset   Mental illness Mother        born in mental hospital   Breast cancer Neg Hx    Family Psychiatric  History: see above Social History:  Social History   Substance and Sexual Activity  Alcohol Use Never     Social History   Substance and Sexual Activity  Drug Use Never    Social History   Socioeconomic History   Marital status: Legally Separated    Spouse name: Not on file   Number of children: Not on file   Years of education: Not on file   Highest education level: Not on file  Occupational History   Not on file  Tobacco Use   Smoking status: Former    Packs/day: 1.00    Years: 30.00    Total pack years: 30.00    Types: Cigarettes    Quit date: 2023    Years since quitting: 1.0   Smokeless tobacco: Never  Vaping Use   Vaping Use: Never used  Substance and Sexual Activity   Alcohol use: Never   Drug use: Never   Sexual activity: Not Currently    Partners: Male  Other Topics Concern   Not on file  Social History Narrative   Not on file   Social Determinants of Health   Financial Resource Strain: Low Risk  (05/13/2022)  Overall Financial Resource Strain (CARDIA)    Difficulty of Paying Living Expenses: Not hard at all  Food Insecurity: No Food Insecurity (05/13/2022)   Hunger Vital Sign    Worried About Running Out of Food in the Last Year: Never true    Ran Out of Food in the Last Year: Never true  Transportation Needs: No Transportation Needs (05/13/2022)   PRAPARE - Hydrologist (Medical): No    Lack of Transportation (Non-Medical): No  Physical Activity: Inactive (05/13/2022)   Exercise Vital Sign    Days of Exercise per Week: 0 days    Minutes of Exercise per Session: 0 min  Stress: No Stress Concern Present (05/13/2022)   [place]    Feeling of Stress : Not at all   Social Connections: Moderately Integrated (05/13/2022)   Social Connection and Isolation Panel [NHANES]    Frequency of Communication with Friends and Family: More than three times a week    Frequency of Social Gatherings with Friends and Family: More than three times a week    Attends Religious Services: More than 4 times per year    Active Member of [employer] or Organizations: Yes    Attends Music therapist: More than 4 times per year    Marital Status: Separated   Additional Social History:    Allergies:   Allergies  Allergen Reactions   Tomato (Diagnostic) Other (See Comments)    Tongue break out.    Citrus Rash    Labs:  Results for orders placed or performed during the hospital encounter of 07/19/22 (from the past 48 hour(s))  Comprehensive metabolic panel     Status: Abnormal   Collection Time: 07/19/22 10:45 AM  Result Value Ref Range   Sodium 140 135 - 145 mmol/L   Potassium 3.5 3.5 - 5.1 mmol/L   Chloride 107 98 - 111 mmol/L   CO2 23 22 - 32 mmol/L   Glucose, Bld 191 (H) 70 - 99 mg/dL    Comment: Glucose reference range applies only to samples taken after fasting for at least 8 hours.   BUN 14 8 - 23 mg/dL   Creatinine, Ser 1.49 (H) 0.44 - 1.00 mg/dL   Calcium 9.6 8.9 - 10.3 mg/dL   Total Protein 8.2 (H) 6.5 - 8.1 g/dL   Albumin 4.6 3.5 - 5.0 g/dL   AST 41 15 - 41 U/L   ALT 25 0 - 44 U/L   Alkaline Phosphatase 42 38 - 126 U/L   Total Bilirubin 0.7 0.3 - 1.2 mg/dL   GFR, Estimated 37 (L) >60 mL/min    Comment: (NOTE) Calculated using the CKD-EPI Creatinine Equation (2021)    Anion gap 10 5 - 15    Comment: Performed at [facility], [address] [place], [place] [address]  Ethanol     Status: None   Collection Time: 07/19/22 10:45 AM  Result Value Ref Range   Alcohol, Ethyl (B) <10 <10 mg/dL    Comment: (NOTE) Lowest detectable limit for serum alcohol is 10 mg/dL.  For medical purposes only. Performed at [facility],  [address] [place], [place] [address]   Salicylate level     Status: Abnormal   Collection Time: 07/19/22 10:45 AM  Result Value Ref Range   Salicylate Lvl <6.3 (L) 7.0 - 30.0 mg/dL    Comment: Performed at [facility], [place],  [place] [address]  Acetaminophen level     Status: Abnormal   Collection Time: 07/19/22 10:45 AM  Result Value Ref Range   Acetaminophen (Tylenol), Serum <10 (L) 10 - 30 ug/mL    Comment: (NOTE) Therapeutic concentrations vary significantly. A range of 10-30 ug/mL  may be an effective concentration for many patients. However, some  are best treated at concentrations outside of this range. Acetaminophen concentrations >150 ug/mL at 4 hours after ingestion  and >50 ug/mL at 12 hours after ingestion are often associated with  toxic reactions.  Performed at [facility], [address] [place], [place] [address]   cbc     Status: None   Collection Time: 07/19/22 10:45 AM  Result Value Ref Range   WBC 4.2 4.0 - 10.5 K/uL   RBC 4.45 3.87 - 5.11 MIL/uL   Hemoglobin 12.8 12.0 - 15.0 g/dL   HCT 38.7 36.0 - 46.0 %   MCV 87.0 80.0 - 100.0 fL   MCH 28.8 26.0 - 34.0 pg   MCHC 33.1 30.0 - 36.0 g/dL   RDW 13.5 11.5 - 15.5 %   Platelets 306 150 - 400 K/uL   nRBC 0.0 0.0 - 0.2 %    Comment: Performed at [facility], [address], [place], [place] [address]  Resp panel by RT-PCR (RSV, Flu A&B, Covid) Anterior Nasal Swab     Status: None   Collection Time: 07/19/22 10:47 AM   Specimen: Anterior Nasal Swab  Result Value Ref Range   SARS Coronavirus 2 by RT PCR NEGATIVE NEGATIVE    Comment: (NOTE) SARS-CoV-2 target nucleic acids are NOT DETECTED.  The SARS-CoV-2 RNA is generally detectable in upper respiratory specimens during the acute phase of infection. The lowest concentration of SARS-CoV-2 viral copies this assay can detect is 138 copies/mL. A negative result does not preclude SARS-Cov-2 infection and  should not be used as the sole basis for treatment or other patient management decisions. A negative result may occur with  improper specimen collection/handling, submission of specimen other than nasopharyngeal swab, presence of viral mutation(s) within the areas targeted by this assay, and inadequate number of viral copies(<138 copies/mL). A negative result must be combined with clinical observations, patient history, and epidemiological information. The expected result is Negative.  Fact Sheet for Patients:  EntrepreneurPulse.com.au  Fact Sheet for Healthcare Providers:  IncredibleEmployment.be  This test is no t yet approved or cleared by the [place] FDA and  has been authorized for detection and/or diagnosis of SARS-CoV-2 by FDA under an Emergency Use Authorization (EUA). This EUA will remain  in effect (meaning this test can be used) for the duration of the COVID-19 declaration under Section 564(b)(1) of the Act, 21 U.S.C.section 360bbb-3(b)(1), unless the authorization is terminated  or revoked sooner.       Influenza A by PCR NEGATIVE NEGATIVE   Influenza B by PCR NEGATIVE NEGATIVE    Comment: (NOTE) The Xpert Xpress SARS-CoV-2/FLU/RSV plus assay is intended as an aid in the diagnosis of influenza from Nasopharyngeal swab specimens and should not be used as a sole basis for treatment. Nasal washings and aspirates are unacceptable for Xpert Xpress SARS-CoV-2/FLU/RSV testing.  Fact Sheet for Patients: EntrepreneurPulse.com.au  Fact Sheet for Healthcare Providers: IncredibleEmployment.be  This test is not yet approved or cleared by the [place] FDA and has been authorized for detection and/or diagnosis of SARS-CoV-2 by FDA under an Emergency Use Authorization (EUA). This EUA will remain in effect (meaning this test  can be used) for the duration of the COVID-19 declaration under Section  564(b)(1) of the Act, 21 U.S.C. section 360bbb-3(b)(1), unless the authorization is terminated or revoked.     Resp Syncytial Virus by PCR NEGATIVE NEGATIVE    Comment: (NOTE) Fact Sheet for Patients: EntrepreneurPulse.com.au  Fact Sheet for Healthcare Providers: IncredibleEmployment.be  This test is not yet approved or cleared by the [place] FDA and has been authorized for detection and/or diagnosis of SARS-CoV-2 by FDA under an Emergency Use Authorization (EUA). This EUA will remain in effect (meaning this test can be used) for the duration of the COVID-19 declaration under Section 564(b)(1) of the Act, 21 U.S.C. section 360bbb-3(b)(1), unless the authorization is terminated or revoked.  Performed at [facility], [address] [place], [place] [address]   Urine Drug Screen, Qualitative     Status: None   Collection Time: 07/20/22  2:50 AM  Result Value Ref Range   Tricyclic, Ur Screen NONE DETECTED NONE DETECTED   Amphetamines, Ur Screen NONE DETECTED NONE DETECTED   MDMA (Ecstasy)Ur Screen NONE DETECTED NONE DETECTED   Cocaine Metabolite,Ur [place] NONE DETECTED NONE DETECTED   Opiate, Ur Screen NONE DETECTED NONE DETECTED   Phencyclidine (PCP) Ur S NONE DETECTED NONE DETECTED   Cannabinoid 50 Ng, Ur [place] NONE DETECTED NONE DETECTED   Barbiturates, Ur Screen NONE DETECTED NONE DETECTED   Benzodiazepine, Ur Scrn NONE DETECTED NONE DETECTED   Methadone Scn, Ur NONE DETECTED NONE DETECTED    Comment: (NOTE) Tricyclics + metabolites, urine    Cutoff 1000 ng/mL Amphetamines + metabolites, urine  Cutoff 1000 ng/mL MDMA (Ecstasy), urine              Cutoff 500 ng/mL Cocaine Metabolite, urine          Cutoff 300 ng/mL Opiate + metabolites, urine        Cutoff 300 ng/mL Phencyclidine (PCP), urine         Cutoff 25 ng/mL Cannabinoid, urine                 Cutoff 50 ng/mL Barbiturates + metabolites, urine  Cutoff 200  ng/mL Benzodiazepine, urine              Cutoff 200 ng/mL Methadone, urine                   Cutoff 300 ng/mL  The urine drug screen provides only a preliminary, unconfirmed analytical test result and should not be used for non-medical purposes. Clinical consideration and professional judgment should be applied to any positive drug screen result due to possible interfering substances. A more specific alternate chemical method must be used in order to obtain a confirmed analytical result. Gas chromatography / mass spectrometry (GC/MS) is the preferred confirm atory method. Performed at [facility], [address] [place], [place] [address]     Current Facility-Administered Medications  Medication Dose Route Frequency Provider Last Rate Last Admin   divalproex (DEPAKOTE) DR tablet 500 mg  500 mg Oral BID Alison Murray, RPH   500 mg at 07/20/22 7654   ibuprofen (ADVIL) tablet 600 mg  600 mg Oral Q6H PRN Carrie Mew, MD   600 mg at 07/20/22 0526   OLANZapine (ZYPREXA) tablet 10 mg  10 mg Oral BID Patrecia Pour, NP   10 mg at 07/20/22 6503   traZODone (DESYREL) tablet 50 mg  50 mg Oral QHS Patrecia Pour, NP   50 mg at  07/19/22 2025   Current Outpatient Medications  Medication Sig Dispense Refill   doxylamine, Sleep, (UNISOM) 25 MG tablet Take 25 mg by mouth at bedtime as needed for sleep or rhinitis.     magnesium gluconate (MAGONATE) 500 MG tablet Take 500 mg by mouth at bedtime.     OLANZapine (ZYPREXA) 15 MG tablet Take 1 tablet (15 mg total) by mouth at bedtime. 30 tablet 0   traZODone (DESYREL) 50 MG tablet Take 1 tablet (50 mg total) by mouth at bedtime. 30 tablet 0    Musculoskeletal: Strength & Muscle Tone: within normal limits Gait & Station: normal Patient leans: N/A  Psychiatric Specialty Exam: Physical Exam Vitals and nursing note reviewed.  Constitutional:      Appearance: Normal appearance.  HENT:     Nose: Nose normal.  Pulmonary:      Effort: Pulmonary effort is normal.  Musculoskeletal:        General: Normal range of motion.     Cervical back: Normal range of motion.  Neurological:     General: No focal deficit present.     Mental Status: She is alert and oriented to person, place, and time.  Psychiatric:        Attention and Perception: Attention and perception normal.        Mood and Affect: Mood is anxious.        Speech: Speech is rapid and pressured and tangential.        Behavior: Behavior is hyperactive. Behavior is cooperative.        Thought Content: Thought content normal.        Cognition and Memory: Cognition and memory normal.        Judgment: Judgment is impulsive.     Review of Systems  Psychiatric/Behavioral:  The patient is nervous/anxious.   All other systems reviewed and are negative.   Blood pressure (!) 163/103, pulse (!) 108, temperature 98.4 F (36.9 C), temperature source Oral, resp. rate 18, SpO2 100 %.There is no height or weight on file to calculate BMI.  General Appearance: Casual  Eye Contact:  Fair  Speech:  Normal Rate  Volume:  Normal  Mood:  Anxious and euphoric  Affect:  Congruent  Thought Process:  Tangential at times  Orientation:  Full (Time, Place, and Person)  Thought Content:  WDL  Suicidal Thoughts:  No  Homicidal Thoughts:  No  Memory:  Immediate;   Fair Recent;   Fair Remote;   Fair  Judgement:  Poor  Insight:  FAir  Psychomotor Activity:  WDL  Concentration:  FAir  Recall:  [place] of Knowledge:  Good  Language:  Good  Akathisia:  No  Handed:  Right  AIMS (if indicated):     Assets:  Housing Leisure Time Physical Health Resilience Social Support  ADL's:  Intact  Cognition:  Impaired,  Mild  Sleep:        Physical Exam: Physical Exam Vitals and nursing note reviewed.  Constitutional:      Appearance: Normal appearance.  HENT:     Nose: Nose normal.  Pulmonary:     Effort: Pulmonary effort is normal.  Musculoskeletal:        General:  Normal range of motion.     Cervical back: Normal range of motion.  Neurological:     General: No focal deficit present.     Mental Status: She is alert and oriented to person, place, and time.  Psychiatric:  Attention and Perception: Attention and perception normal.        Mood and Affect: Mood is anxious.        Speech: Speech is rapid and pressured and tangential.        Behavior: Behavior is hyperactive. Behavior is cooperative.        Thought Content: Thought content normal.        Cognition and Memory: Cognition and memory normal.        Judgment: Judgment is impulsive.    Review of Systems  Psychiatric/Behavioral:  The patient is nervous/anxious.   All other systems reviewed and are negative.  Blood pressure (!) 163/103, pulse (!) 108, temperature 98.4 F (36.9 C), temperature source Oral, resp. rate 18, SpO2 100 %. There is no height or weight on file to calculate BMI.  Treatment Plan Summary: Daily contact with patient to assess and evaluate symptoms and progress in treatment, Medication management, and Plan : Bipolar disorder, mania, moderate: Depakote 500 mg BID  Zyprexa 10 mg BID  Insomnia: Trazodone 50 mg daily at bedtime Unisom restarted  Disposition:Admit to gero unit for stabilization  Waylan Boga, NP 07/20/2022 9:24 AM

## 2022-07-20 NOTE — Progress Notes (Signed)
   07/20/22 1600  Clinical Encounter Type  Visited With Patient  Visit Type Initial  Referral From Physician  Consult/Referral To Chaplain   Chaplain responded to [facility] consult requesting information on HCPOA. Packet given and explained. Chaplain services available for completion of documents.

## 2022-07-20 NOTE — Group Note (Signed)
LCSW Group Therapy Note  Group Date: 07/20/2022 Start Time: 1300 End Time: 1400   Type of Therapy and Topic:  Group Therapy - Healthy vs Unhealthy Coping Skills  Participation Level:  Did Not Attend   Description of Group The focus of this group was to determine what unhealthy coping techniques typically are used by group members and what healthy coping techniques would be helpful in coping with various problems. Patients were guided in becoming aware of the differences between healthy and unhealthy coping techniques. Patients were asked to identify 2-3 healthy coping skills they would like to learn to use more effectively.  Therapeutic Goals Patients learned that coping is what human beings do all day long to deal with various situations in their lives Patients defined and discussed healthy vs unhealthy coping techniques Patients identified their preferred coping techniques and identified whether these were healthy or unhealthy Patients determined 2-3 healthy coping skills they would like to become more familiar with and use more often. Patients provided support and ideas to each other   Summary of Patient Progress:    Patient not yet admitted to the unit at time of group.    Therapeutic Modalities Cognitive Behavioral Therapy Motivational Interviewing  Larose Kells 07/20/2022  2:45 PM

## 2022-07-20 NOTE — ED Notes (Signed)
Pt requested shower; provided clean hospital clothing and linens.  Shower setup provided with soap, shampoo, toothbrush/toothpaste, and deodorant.  Pt able to preform own ADL's with no assistance.    

## 2022-07-20 NOTE — Progress Notes (Signed)
Admission Note:   72 year old female presenting to unit for treatment for mania. Pt appears in a pleasant and hyper mood and affect. Pt was calm and cooperative with admission process. Pt presents denies SI, HI, and AVH. Patient contracts for safety upon admission. Pt presenting with tangential speech and flight of ideas during assessment. Pt says" I want you all to help me work on writing a book while I am here. That is the only goal I have" when asked about the patient if she knows why she is here.  Pt has Past medical bipolar disorder, chronic kidney disease, COPD and DM without complication. Patient states she does not use any assistive devices at home but has had a few falls within the last year. Skin was assessed and found to be clear of any abnormal marks apart from a scar on left buttocks and bruising on her left buttock and left upper arm. PT searched and no contraband found, POC and unit policies explained and understanding verbalized. Consents obtained. Food and fluids offered, and accepted. Pt had no additional questions or concerns

## 2022-07-20 NOTE — ED Provider Notes (Addendum)
Emergency Medicine Observation Re-evaluation Note  Shelby Jordan is a 72 y.o. female, seen on rounds today.   Physical Exam  BP (!) 163/103 (BP Location: Left Arm)   Pulse (!) 108   Temp 98.4 F (36.9 C) (Oral)   Resp 18   SpO2 100%  Physical Exam General: Patient resting comfortably in bed Lungs: Patient not in respiratory distress Psych: Patient not combative  ED Course / MDM  EKG:     Plan  Current plan is for psychiatric evaluation and treatment.    Nena Polio, MD 07/20/22 [phone] We will have to watch his blood pressures up somewhat.   Nena Polio, MD 07/20/22 [phone]

## 2022-07-21 ENCOUNTER — Ambulatory Visit: Payer: Medicare Other | Admitting: Family Medicine

## 2022-07-21 DIAGNOSIS — F319 Bipolar disorder, unspecified: Secondary | ICD-10-CM | POA: Diagnosis not present

## 2022-07-21 LAB — GLUCOSE, CAPILLARY: Glucose-Capillary: 86 mg/dL (ref 70–99)

## 2022-07-21 MED ORDER — MAGNESIUM GLUCONATE 500 MG PO TABS
500.0000 mg | ORAL_TABLET | Freq: Every day | ORAL | Status: DC
  Administered 2022-07-21 – 2022-07-30 (×10): 500 mg via ORAL
  Filled 2022-07-21 (×10): qty 1

## 2022-07-21 MED ORDER — AMLODIPINE BESYLATE 5 MG PO TABS
5.0000 mg | ORAL_TABLET | Freq: Every day | ORAL | Status: DC
  Administered 2022-07-21 – 2022-07-24 (×4): 5 mg via ORAL
  Filled 2022-07-21 (×4): qty 1

## 2022-07-21 NOTE — Group Note (Signed)
LCSW Group Therapy Note  Group Date: 07/21/2022 Start Time: 1215 End Time: 1300   Type of Therapy and Topic:  Group Therapy - Healthy vs Unhealthy Coping Skills  Participation Level:  None   Description of Group The focus of this group was to determine what unhealthy coping techniques typically are used by group members and what healthy coping techniques would be helpful in coping with various problems. Patients were guided in becoming aware of the differences between healthy and unhealthy coping techniques. Patients were asked to identify 2-3 healthy coping skills they would like to learn to use more effectively.  Therapeutic Goals Patients learned that coping is what human beings do all day long to deal with various situations in their lives Patients defined and discussed healthy vs unhealthy coping techniques Patients identified their preferred coping techniques and identified whether these were healthy or unhealthy Patients determined 2-3 healthy coping skills they would like to become more familiar with and use more often. Patients provided support and ideas to each other   Summary of Patient Progress:  During group, she did not express any insight into subject and was disruptive to her peers by walking in and out of group and talking over the participants though somewhat redirectable. Patient proved closed to input from peers and feedback from [place]. Patient demonstrated poor insight into the subject matter, was respectful of peers, and participated throughout the entire session.   Therapeutic Modalities Cognitive Behavioral Therapy Motivational Interviewing  Shelby Jordan A [place], [name] 07/21/2022  1:12 PM

## 2022-07-21 NOTE — Telephone Encounter (Signed)
Anda Kraft, I appreciate you taking care of this.  Claiborne Billings, please call daughter and ensure that they know that pt will need to be seen by psychiatry within the next thirty days for refill of zyprexa and trazodone, as these are out of my scope of practice for chronic management.

## 2022-07-21 NOTE — Plan of Care (Signed)
D- Patient alert and oriented to self.  Patient denies SI, HI, AVH, and pain. Patient presents very  hyper and intrusive with needy behaviors. Patient imitating others on the unit and acting as though she is not able to do any ADLs or walk. This Probation officer encouraged patient to walk correctly so that she does not fall. Patient compliant with this writers ask and repositioned her walk to a correct ambulation stance. Patient continuously redirected on behaviors appropriate for the unit. Patient trying to take others food off their trays, interrupting staff during progression meeting, and threatening to fight other patient on the unit. This Probation officer tried to de-escalate patient and PRN Ativan administer.   A- Scheduled medications administered to patient, per MD orders. Support and encouragement provided.  Routine safety checks conducted every 15 minutes.  Patient informed to notify staff with problems or concerns.  R- No adverse drug reactions noted. Patient contracts for safety at this time. Patient compliant with medications and treatment plan. Patient receptive, calm, and cooperative. Patient interacts with others on the unit.  Patient remains safe at this time.  Problem: Health Behavior/Discharge Planning: Goal: Ability to manage health-related needs will improve Outcome: Not Progressing   Problem: Clinical Measurements: Goal: Will remain free from infection Outcome: Progressing  Problem: Clinical Measurements: Goal: Diagnostic test results will improve Outcome: Progressing   Problem: Activity: Goal: Risk for activity intolerance will decrease Outcome: Progressing   Problem: Nutrition: Goal: Adequate nutrition will be maintained Outcome: Progressing   Problem: Elimination: Goal: Will not experience complications related to urinary retention Outcome: Progressing   Problem: Pain Managment: Goal: General experience of comfort will improve Outcome: Progressing   Problem: Safety: Goal:  Ability to remain free from injury will improve Outcome: Progressing   Problem: Skin Integrity: Goal: Risk for impaired skin integrity will decrease Outcome: Progressing

## 2022-07-21 NOTE — Plan of Care (Signed)
  Problem: Activity: Goal: Risk for activity intolerance will decrease Outcome: Progressing   Problem: Nutrition: Goal: Adequate nutrition will be maintained Outcome: Progressing   Problem: Coping: Goal: Level of anxiety will decrease Outcome: Progressing   Problem: Elimination: Goal: Will not experience complications related to urinary retention Outcome: Progressing   Problem: Pain Managment: Goal: General experience of comfort will improve Outcome: Progressing   Problem: Safety: Goal: Ability to remain free from injury will improve Outcome: Progressing   Problem: Clinical Measurements: Goal: Cardiovascular complication will be avoided Outcome: Not Progressing

## 2022-07-21 NOTE — BHH Counselor (Signed)
Adult Comprehensive Assessment  Patient ID: Shelby Jordan, female   DOB: February 13, 1951, 72 y.o.   MRN: [id number]  Information Source: Information source: Patient (Partial assessment completed with previous pt PSA on 12/15/19)  Current Stressors:  Patient states their primary concerns and needs for treatment are:: "my daughter, she brought me here" Patient states their goals for this hospitilization and ongoing recovery are:: "my principles" Educational / Learning stressors: Pt denies Employment / Job issues: Pt denies Family Relationships: Pt denies Museum/gallery curator / Lack of resources (include bankruptcy): "hell no" Housing / Lack of housing: Pt denies Physical health (include injuries & life threatening diseases): Chronic Kidney disease Social relationships: Pt denies Substance abuse: Pt denies Bereavement / Loss: Ms. Mahlon Gammon  Living/Environment/Situation:  Living Arrangements: Children, Other relatives Who else lives in the home?: Pt states that she lives with her daughter, daughter's child and son's child How long has patient lived in current situation?: "a little while" What is atmosphere in current home: Other (Comment) ("fine")  Family History:  Marital status: Divorced Divorced, when?: "4/5 years" What types of issues is patient dealing with in the relationship?: unable to assess Are you sexually active?: No What is your sexual orientation?: unable to assess Has your sexual activity been affected by drugs, alcohol, medication, or emotional stress?: Pt denies Does patient have children?: Yes How many children?: 2 How is patient's relationship with their children?: "I get along better with my son, my daughter, I can and can't stand her"   Childhood History:  By whom was/is the patient raised?: Per previous assessment, Mother, Other (Comment) Additional childhood history information: Per previous assessment, Per the patient's granddaughter: "she was raised by her mother and  aunt, her mother had some mental illness as well" Description of patient's relationship with caregiver when they were a child: Per previous assessment, Per the patient's granddaughter: "not good" Patient's description of current relationship with people who raised him/her: Per previous assessment, Per the patient's granddaughter: "both are deceased" Does patient have siblings?: Yes Number of Siblings: 1 Description of patient's current relationship with siblings: Pt states that she does not talk with her brother. Per the patient's granddaughter: "they don't have one" Did patient suffer any verbal/emotional/physical/sexual abuse as a child?: Pt declined to answer, per previous assessment, Yes(Per the patient's granddaughter: "her whole life is nothing but trauma")  Education:  Highest grade of school patient has completed: Per the patient's granddaughter: :12th" Currently a student?: No Learning disability?: No  Employment/Work Situation:   Employment situation: On disability Why is patient on disability: Previous assessment, Per the patient's granddaughter: "her mental health" How long has patient been on disability: Previous assessment, Per the patient's granddaughter: "a while" Did You Receive Any Psychiatric Treatment/Services While in the Military?: No(NA) Are There Guns or Other Weapons in Your Home?: No   Financial Resources:   Financial resources: Teacher, early years/pre, Medicare Does patient have a Programmer, applications or guardian?: No    Alcohol/Substance Abuse:   What has been your use of drugs/alcohol within the last 12 months?: Family denies concerns. Alcohol/Substance Abuse Treatment Hx: Denies past history   Social Support System:   Astronomer System: Per previous assessment, Per the patient's granddaughter: "her family" Type of faith/religion: Per the patient's granddaughter: Per previous assessment "Catholic"  Leisure/Recreation:   Leisure and Hobbies: unable  to assess  Strengths/Needs:   What is the patient's perception of their strengths?: unable to assess  Discharge Plan:   Currently receiving community mental health services: No  Patient states concerns and preferences for aftercare planning are: Family would like a recommendaton for outpatient. Pt states "yes" when asked regarding referral for outpatient resources. Patient states they will know then they are safe and ready for discharge when? "Why wouldn't I be" Does patient have access to transportation?: Yes Does patient have financial barriers related discharge medications? No Will patient be returning to same living situation after discharge?: Yes  Summary/Recommendations:   Summary and Recommendations (to be completed by the evaluator): Summary and Recommendations (to be completed by the evaluator): Patient is a 72 year old female from [place], [place] ([place]).  She receives SSDI and is unemployed. She presents to the hospital following manic symptoms and unable to fill her psychiatric medication subscriptions. She has been inpatient in November and was admitted to [facility] in 2021. Recent stressors include tangential speech, manic behavior and medication mismanagement and no outpatient provider.  She has a primary diagnosis of Bipolar 1 Disorder. She is open to a referral to community mental health services for medication management. Recommendations include: crisis stabilization, therapeutic milieu, encourage group attendance and participation, medication management for mood stabilization and development of comprehensive mental wellness plan.  Lakeesha Fontanilla A [place]. 07/21/2022

## 2022-07-21 NOTE — H&P (Signed)
Psychiatric Admission Assessment Adult  Patient Identification: Shelby Jordan MRN:  [id number] Date of Evaluation:  07/21/2022 Chief Complaint:  Bipolar 1 disorder ([place]) [F31.9] Principal Diagnosis: Bipolar 1 disorder ([place]) Diagnosis:  Principal Problem:   Bipolar 1 disorder ([place])  History of Present Illness: Shelby Jordan is a 72 year old African-American female who was voluntarily admitted to geriatric psychiatry for manic behavior.  She lives with her daughter in [place].  Daughter brought her into the emergency room.  She apparently ran out of her medications.  She says that she does well on Depakote.  She is originally from [place] and moved down to be with her daughter who has been here for 4 years.  She is pleasant and cooperative.  She says she does well on Benadryl at bedtime.  She does not have a psychiatrist.  She has been hospitalized more than 10 times between [place] and [place].  She was hospitalized at [facility] 2 years ago and was on Depakote and lithium.  She has chronic kidney disease.  She also has type 2 diabetes.  She has a daughter that she lives with and a son who lives in [place].  She denies any auditory or visual hallucinations.  She denies any suicidal ideation.  Her thought process is somewhat tangential and her speech is mildly pressured.  From reviewing the chart it sounds like she has not been sleeping very well.  PER NP EVALUATION IN THE ER: 72 yo female with bipolar d/o with mania symptoms. She was recently inpatient in November and then off her medications once she ran out. On assessment, she is easily distracted and focused on getting more food to eat. Difficult to redirect, tangential at times and stated her daughter misunderstood the "kids talking about me threatening to start a fire, you know how kids are." She stated her sleep was "good, don't listen to my daughter, I slept to six o'clock." Client has been to a couple of places seeking medications as she does not  have an outside provider for mental health. Medications restarted and will need inpatient to stabilize unless the medications stablize her before then.   Associated Signs/Symptoms: Depression Symptoms: None (Hypo) Manic Symptoms:  Distractibility, Elevated Mood, Flight of Ideas, Grandiosity, Hallucinations, Anxiety Symptoms:   None Psychotic Symptoms:  Hallucinations: Visual Ideas of Reference, PTSD Symptoms: NA Total Time spent with patient: 1 hour  Past Psychiatric History: More than 10 inpatient hospitalizations.  She currently does not have a psychiatrist.  Long history of bipolar disorder.  Is the patient at risk to self? No.  Has the patient been a risk to self in the past 6 months? No.  Has the patient been a risk to self within the distant past? No.  Is the patient a risk to others? No.  Has the patient been a risk to others in the past 6 months? No.  Has the patient been a risk to others within the distant past? No.   [place] Scale:  [facility] Admission (Current) from 07/20/2022 in [facility] from 07/19/2022 in [facility] from 05/22/2022 in [facility] No Risk No Risk No Risk        Prior Inpatient Therapy: Yes.   If yes, describe as above Prior Outpatient Therapy: Yes.   If yes, describe as above  Alcohol Screening: 1. How often do you have a drink containing alcohol?: Never 2. How many drinks  containing alcohol do you have on a typical day when you are drinking?: 1 or 2 3. How often do you have six or more drinks on one occasion?: Never AUDIT-C Score: 0 Alcohol Brief Interventions/Follow-up: Alcohol education/Brief advice Substance Abuse History in the last 12 months:  No. Consequences of Substance Abuse: NA Previous Psychotropic Medications: Yes  Psychological Evaluations: Yes  Past Medical History:  Past Medical History:   Diagnosis Date   Bipolar 1 disorder ([place])    Depression    Hepatitis C    High cholesterol    Hypertension    UTI (urinary tract infection)     Past Surgical History:  Procedure Laterality Date   APPENDECTOMY     BREAST BIOPSY Right 03/04/2022   stereo bx, mass "X" clip-path pending   TOE AMPUTATION     Family History:  Family History  Problem Relation Age of Onset   Mental illness Mother        born in mental hospital   Breast cancer Neg Hx    Family Psychiatric  History: Unremarkable Tobacco Screening:  Social History   Tobacco Use  Smoking Status Former   Packs/day: 1.00   Years: 30.00   Total pack years: 30.00   Types: Cigarettes   Quit date: 2023   Years since quitting: 1.0  Smokeless Tobacco Never    BH Tobacco Counseling     Are you interested in Tobacco Cessation Medications?  No value filed. Counseled patient on smoking cessation:  No value filed. Reason Tobacco Screening Not Completed: No value filed.       Social History:  Social History   Substance and Sexual Activity  Alcohol Use Never     Social History   Substance and Sexual Activity  Drug Use Never    Additional Social History:                           Allergies:   Allergies  Allergen Reactions   Tomato (Diagnostic) Other (See Comments)    Tongue break out.    Citrus Rash   Lab Results:  Results for orders placed or performed during the hospital encounter of 07/19/22 (from the past 48 hour(s))  Urine Drug Screen, Qualitative     Status: None   Collection Time: 07/20/22  2:50 AM  Result Value Ref Range   Tricyclic, Ur Screen NONE DETECTED NONE DETECTED   Amphetamines, Ur Screen NONE DETECTED NONE DETECTED   MDMA (Ecstasy)Ur Screen NONE DETECTED NONE DETECTED   Cocaine Metabolite,Ur [place] NONE DETECTED NONE DETECTED   Opiate, Ur Screen NONE DETECTED NONE DETECTED   Phencyclidine (PCP) Ur S NONE DETECTED NONE DETECTED   Cannabinoid 50 Ng, Ur [place] NONE DETECTED NONE  DETECTED   Barbiturates, Ur Screen NONE DETECTED NONE DETECTED   Benzodiazepine, Ur Scrn NONE DETECTED NONE DETECTED   Methadone Scn, Ur NONE DETECTED NONE DETECTED    Comment: (NOTE) Tricyclics + metabolites, urine    Cutoff 1000 ng/mL Amphetamines + metabolites, urine  Cutoff 1000 ng/mL MDMA (Ecstasy), urine              Cutoff 500 ng/mL Cocaine Metabolite, urine          Cutoff 300 ng/mL Opiate + metabolites, urine        Cutoff 300 ng/mL Phencyclidine (PCP), urine         Cutoff 25 ng/mL Cannabinoid, urine  Cutoff 50 ng/mL Barbiturates + metabolites, urine  Cutoff 200 ng/mL Benzodiazepine, urine              Cutoff 200 ng/mL Methadone, urine                   Cutoff 300 ng/mL  The urine drug screen provides only a preliminary, unconfirmed analytical test result and should not be used for non-medical purposes. Clinical consideration and professional judgment should be applied to any positive drug screen result due to possible interfering substances. A more specific alternate chemical method must be used in order to obtain a confirmed analytical result. Gas chromatography / mass spectrometry (GC/MS) is the preferred confirm atory method. Performed at [facility], [address] [place], [place] [address]     Blood Alcohol level:  Lab Results  Component Value Date   [facility] <10 07/19/2022   ETH <10 02/58/5277    Metabolic Disorder Labs:  Lab Results  Component Value Date   HGBA1C 6.3 11/28/2021   MPG 134.11 12/14/2019   No results found for: "PROLACTIN" Lab Results  Component Value Date   CHOL 233 (H) 11/28/2021   TRIG 133.0 11/28/2021   HDL 71.50 11/28/2021   CHOLHDL 3 11/28/2021   VLDL 26.6 11/28/2021   LDLCALC 135 (H) 11/28/2021    Current Medications: Current Facility-Administered Medications  Medication Dose Route Frequency Provider Last Rate Last Admin   acetaminophen (TYLENOL) tablet 650 mg  650 mg Oral Q6H PRN Sherlon Handing,  NP       alum & mag hydroxide-simeth (MAALOX/MYLANTA) 200-200-20 MG/5ML suspension 30 mL  30 mL Oral Q4H PRN Sherlon Handing, NP       divalproex (DEPAKOTE) DR tablet 500 mg  500 mg Oral BID Waldon Merl F, NP   500 mg at 07/21/22 0941   doxylamine (Sleep) (UNISOM) tablet 25 mg  25 mg Oral QHS PRN Sherlon Handing, NP   25 mg at 07/21/22 0109   feeding supplement (ENSURE ENLIVE / ENSURE PLUS) liquid 237 mL  237 mL Oral TID with meals Parks Ranger, DO   237 mL at 07/21/22 0750   ibuprofen (ADVIL) tablet 600 mg  600 mg Oral Q6H PRN Sherlon Handing, NP   600 mg at 07/21/22 0109   LORazepam (ATIVAN) tablet 1 mg  1 mg Oral Q4H PRN Parks Ranger, DO   1 mg at 07/21/22 0016   magnesium gluconate (MAGONATE) tablet 500 mg  500 mg Oral Daily Parks Ranger, DO       magnesium hydroxide (MILK OF MAGNESIA) suspension 30 mL  30 mL Oral Daily PRN Sherlon Handing, NP       OLANZapine (ZYPREXA) tablet 10 mg  10 mg Oral BID Waldon Merl F, NP   10 mg at 07/21/22 0941   traZODone (DESYREL) tablet 50 mg  50 mg Oral QHS Sherlon Handing, NP   50 mg at 07/20/22 2136   PTA Medications: Medications Prior to Admission  Medication Sig Dispense Refill Last Dose   doxylamine, Sleep, (UNISOM) 25 MG tablet Take 25 mg by mouth at bedtime as needed for sleep or rhinitis.      magnesium gluconate (MAGONATE) 500 MG tablet Take 500 mg by mouth at bedtime.      OLANZapine (ZYPREXA) 15 MG tablet Take 1 tablet (15 mg total) by mouth at bedtime. 30 tablet 0    traZODone (DESYREL) 50 MG tablet Take 1 tablet (50 mg total)  by mouth at bedtime. 30 tablet 0     Musculoskeletal: Strength & Muscle Tone: within normal limits Gait & Station: normal Patient leans: N/A            Psychiatric Specialty Exam:  Presentation  General Appearance:  Appropriate for Environment; Casual  Eye Contact: Fair  Speech: Clear and Coherent  Speech  Volume: Increased  Handedness: Right   Mood and Affect  Mood: Anxious; Labile  Affect: Inappropriate; Full Range   Thought Process  Thought Processes: Disorganized; Irrevelant  Duration of Psychotic Symptoms:N/A Past Diagnosis of Schizophrenia or Psychoactive disorder: Yes  Descriptions of Associations:Loose  Orientation:Partial  Thought Content:Illogical; Scattered  Hallucinations:No data recorded Ideas of Reference:Paranoia  Suicidal Thoughts:No data recorded Homicidal Thoughts:No data recorded  Sensorium  Memory: Immediate Poor; Remote Poor  Judgment: Impaired  Insight: Lacking   Executive Functions  Concentration: Poor  Attention Span: Poor  Recall: Poor  Fund of Knowledge: Poor  Language: Poor   Psychomotor Activity  Psychomotor Activity:No data recorded  Assets  Assets: Desire for Improvement; Financial Resources/Insurance; Housing; Social Support   Sleep  Sleep:No data recorded   Physical Exam: Physical Exam Constitutional:      Appearance: Normal appearance.  HENT:     Head: Normocephalic and atraumatic.     Mouth/Throat:     Pharynx: Oropharynx is clear.  Eyes:     Pupils: Pupils are equal, round, and reactive to light.  Cardiovascular:     Rate and Rhythm: Normal rate and regular rhythm.  Pulmonary:     Effort: Pulmonary effort is normal.     Breath sounds: Normal breath sounds.  Abdominal:     General: Abdomen is flat.     Palpations: Abdomen is soft.  Musculoskeletal:        General: Normal range of motion.  Skin:    General: Skin is warm and dry.  Neurological:     General: No focal deficit present.     Mental Status: She is alert. Mental status is at baseline.  Psychiatric:        Attention and Perception: Attention normal. She perceives visual hallucinations.        Mood and Affect: Mood is elated. Affect is labile and flat.        Speech: Speech is rapid and pressured and tangential.         Behavior: Behavior is cooperative.        Thought Content: Thought content normal.        Cognition and Memory: Cognition and memory normal.        Judgment: Judgment is impulsive and inappropriate.    Review of Systems  Constitutional: Negative.   HENT: Negative.    Eyes: Negative.   Respiratory: Negative.    Cardiovascular: Negative.   Gastrointestinal: Negative.   Genitourinary: Negative.   Musculoskeletal: Negative.   Skin: Negative.   Neurological: Negative.   Endo/Heme/Allergies: Negative.   Psychiatric/Behavioral:  The patient has insomnia.    Blood pressure (!) 164/85, pulse 79, temperature 97.8 F (36.6 C), temperature source Oral, resp. rate 18, SpO2 100 %. There is no height or weight on file to calculate BMI.  Treatment Plan Summary: Daily contact with patient to assess and evaluate symptoms and progress in treatment, Medication management, and Plan restart home medications.  Observation Level/Precautions:  15 minute checks  Laboratory:  CBC Chemistry Profile  Psychotherapy:    Medications:    Consultations:    Discharge Concerns:    Estimated LOS:  Other:     Physician Treatment Plan for Primary Diagnosis: Bipolar 1 disorder ([place]) Long Term Goal(s): Improvement in symptoms so as ready for discharge  Short Term Goals: Ability to identify changes in lifestyle to reduce recurrence of condition will improve, Ability to verbalize feelings will improve, Ability to disclose and discuss suicidal ideas, Ability to demonstrate self-control will improve, Ability to identify and develop effective coping behaviors will improve, Ability to maintain clinical measurements within normal limits will improve, Compliance with prescribed medications will improve, and Ability to identify triggers associated with substance abuse/mental health issues will improve  Physician Treatment Plan for Secondary Diagnosis: Principal Problem:   Bipolar 1 disorder ([place])    I certify that  inpatient services furnished can reasonably be expected to improve the patient's condition.    Parks Ranger, DO 1/2/202411:04 AM

## 2022-07-21 NOTE — Progress Notes (Signed)
Patient at the nurses station for afternoon vital check. Patient noted to have soiled pants and under briefs on her head. This Probation officer tried redirecting patient to remove the briefs off her head and to get cleaned up and change her clothes. This Probation officer supplied patient with new clothes. Patient required numerous prompting to change her clothes. Patient would not let this writer help her dress.Charge nurse went to check on patient and patient was noted to be found on the floor on her knees. Patient stated to this writer and other staff that she got down on the floor herself and did not fall. This Probation officer also noted that patient was kneeling in a puddle of urine. Patient helped to the chair and new clothing items provided. Patient assessed and no injury noted.

## 2022-07-21 NOTE — Plan of Care (Incomplete)

## 2022-07-21 NOTE — BHH Group Notes (Signed)
[facility] Group Notes:  (Nursing/MHT/Case Management/Adjunct)  Date:  07/21/2022  Time:  3:58 PM  Type of Therapy:  Psychoeducational Skills  Participation Level:  Did Not Attend   Adela Lank [facility] 07/21/2022, 3:58 PM

## 2022-07-21 NOTE — BHH Suicide Risk Assessment (Signed)
[facility] Admission Suicide Risk Assessment   Nursing information obtained from:  Patient Demographic factors:  Age 72 or older, Low socioeconomic status Current Mental Status:  NA Loss Factors:  NA Historical Factors:  NA Risk Reduction Factors:  Religious beliefs about death, Living with another person, especially a relative, Positive social support  Total Time spent with patient: 1 hour Principal Problem: Bipolar 1 disorder ([place]) Diagnosis:  Principal Problem:   Bipolar 1 disorder ([place])  Subjective Data: Pt to ED via POV from home voluntary. Pt accompanied by daughter. Daughter reports she suffers from bipolar and schizophrenia and was admitted inpt. Pt was sent home prior to thanksgiving on new prescription. Pt has been out of prescription and daughter states she has been manic, hallucinating and wandering outside of home. Pt is currently back on medication but daughter states behavior has not changed.     Continued Clinical Symptoms:    The "Alcohol Use Disorders Identification Test", Guidelines for Use in Primary Care, Second Edition.  World Pharmacologist [facility] [place]). Score between 0-7:  no or low risk or alcohol related problems. Score between 8-15:  moderate risk of alcohol related problems. Score between 16-19:  high risk of alcohol related problems. Score 20 or above:  warrants further diagnostic evaluation for alcohol dependence and treatment.   CLINICAL FACTORS:   Bipolar Disorder:   Mixed State   Musculoskeletal: Strength & Muscle Tone: within normal limits Gait & Station: normal Patient leans: N/A  Psychiatric Specialty Exam:  Presentation  General Appearance:  Appropriate for Environment; Casual  Eye Contact: Fair  Speech: Clear and Coherent  Speech Volume: Increased  Handedness: Right   Mood and Affect  Mood: Anxious; Labile  Affect: Inappropriate; Full Range   Thought Process  Thought Processes: Disorganized; Irrevelant  Descriptions of  Associations:Loose  Orientation:Partial  Thought Content:Illogical; Scattered  History of Schizophrenia/Schizoaffective disorder:Yes  Duration of Psychotic Symptoms:Greater than six months  Hallucinations:No data recorded Ideas of Reference:Paranoia  Suicidal Thoughts:No data recorded Homicidal Thoughts:No data recorded  Sensorium  Memory: Immediate Poor; Remote Poor  Judgment: Impaired  Insight: Lacking   Executive Functions  Concentration: Poor  Attention Span: Poor  Recall: Poor  Fund of Knowledge: Poor  Language: Poor   Psychomotor Activity  Psychomotor Activity:No data recorded  Assets  Assets: Desire for Improvement; Financial Resources/Insurance; Housing; Social Support   Sleep  Sleep:No data recorded   Physical Exam: Physical Exam: Hypomanic ROS: Negative Blood pressure (!) 164/85, pulse 79, temperature 97.8 F (36.6 C), temperature source Oral, resp. rate 18, SpO2 100 %. There is no height or weight on file to calculate BMI.   COGNITIVE FEATURES THAT CONTRIBUTE TO RISK:  None    SUICIDE RISK:   Minimal: No identifiable suicidal ideation.  Patients presenting with no risk factors but with morbid ruminations; may be classified as minimal risk based on the severity of the depressive symptoms  PLAN OF CARE: Restart home medications.  I certify that inpatient services furnished can reasonably be expected to improve the patient's condition.   [place], DO 07/21/2022, 11:02 AM

## 2022-07-22 ENCOUNTER — Ambulatory Visit: Payer: Self-pay | Admitting: *Deleted

## 2022-07-22 DIAGNOSIS — F319 Bipolar disorder, unspecified: Secondary | ICD-10-CM | POA: Diagnosis not present

## 2022-07-22 LAB — GLUCOSE, CAPILLARY
Glucose-Capillary: 99 mg/dL (ref 70–99)
Glucose-Capillary: 106 mg/dL — ABNORMAL HIGH (ref 70–99)
Glucose-Capillary: 121 mg/dL — ABNORMAL HIGH (ref 70–99)

## 2022-07-22 MED ORDER — OLANZAPINE 5 MG PO TABS
10.0000 mg | ORAL_TABLET | Freq: Every day | ORAL | Status: DC
  Administered 2022-07-22 – 2022-07-29 (×8): 10 mg via ORAL
  Filled 2022-07-22 (×8): qty 2

## 2022-07-22 NOTE — BH IP Treatment Plan (Signed)
Interdisciplinary Treatment and Diagnostic Plan Update  07/22/2022 Time of Session: 9:30AM Shelby Jordan MRN: [id number]  Principal Diagnosis: Bipolar 1 disorder ([place])  Secondary Diagnoses: Principal Problem:   Bipolar 1 disorder ([place])   Current Medications:  Current Facility-Administered Medications  Medication Dose Route Frequency Provider Last Rate Last Admin   acetaminophen (TYLENOL) tablet 650 mg  650 mg Oral Q6H PRN Sherlon Handing, NP       alum & mag hydroxide-simeth (MAALOX/MYLANTA) 200-200-20 MG/5ML suspension 30 mL  30 mL Oral Q4H PRN Sherlon Handing, NP       amLODipine (NORVASC) tablet 5 mg  5 mg Oral Daily Parks Ranger, DO   5 mg at 07/22/22 1004   divalproex (DEPAKOTE) DR tablet 500 mg  500 mg Oral BID Waldon Merl F, NP   500 mg at 07/22/22 1004   doxylamine (Sleep) (UNISOM) tablet 25 mg  25 mg Oral QHS PRN Sherlon Handing, NP   25 mg at 07/21/22 2144   ibuprofen (ADVIL) tablet 600 mg  600 mg Oral Q6H PRN Sherlon Handing, NP   600 mg at 07/21/22 0109   LORazepam (ATIVAN) tablet 1 mg  1 mg Oral Q4H PRN Parks Ranger, DO   1 mg at 07/21/22 1224   magnesium gluconate (MAGONATE) tablet 500 mg  500 mg Oral Daily Parks Ranger, DO   500 mg at 07/22/22 1006   magnesium hydroxide (MILK OF MAGNESIA) suspension 30 mL  30 mL Oral Daily PRN Sherlon Handing, NP       OLANZapine (ZYPREXA) tablet 10 mg  10 mg Oral BID Waldon Merl F, NP   10 mg at 07/22/22 1004   traZODone (DESYREL) tablet 50 mg  50 mg Oral QHS Sherlon Handing, NP   50 mg at 07/21/22 2144   PTA Medications: Medications Prior to Admission  Medication Sig Dispense Refill Last Dose   doxylamine, Sleep, (UNISOM) 25 MG tablet Take 25 mg by mouth at bedtime as needed for sleep or rhinitis.      magnesium gluconate (MAGONATE) 500 MG tablet Take 500 mg by mouth at bedtime.      OLANZapine (ZYPREXA) 15 MG tablet Take 1 tablet (15 mg total) by mouth at bedtime. 30 tablet  0    traZODone (DESYREL) 50 MG tablet Take 1 tablet (50 mg total) by mouth at bedtime. 30 tablet 0     Patient Stressors:    Patient Strengths:    Treatment Modalities: Medication Management, Group therapy, Case management,  1 to 1 session with clinician, Psychoeducation, Recreational therapy.   Physician Treatment Plan for Primary Diagnosis: Bipolar 1 disorder ([place]) Long Term Goal(s): Improvement in symptoms so as ready for discharge   Short Term Goals: Ability to identify changes in lifestyle to reduce recurrence of condition will improve Ability to verbalize feelings will improve Ability to disclose and discuss suicidal ideas Ability to demonstrate self-control will improve Ability to identify and develop effective coping behaviors will improve Ability to maintain clinical measurements within normal limits will improve Compliance with prescribed medications will improve Ability to identify triggers associated with substance abuse/mental health issues will improve  Medication Management: Evaluate patient's response, side effects, and tolerance of medication regimen.  Therapeutic Interventions: 1 to 1 sessions, Unit Group sessions and Medication administration.  Evaluation of Outcomes: Not Met  Physician Treatment Plan for Secondary Diagnosis: Principal Problem:   Bipolar 1 disorder ([place])  Long Term Goal(s): Improvement in symptoms so as ready for  discharge   Short Term Goals: Ability to identify changes in lifestyle to reduce recurrence of condition will improve Ability to verbalize feelings will improve Ability to disclose and discuss suicidal ideas Ability to demonstrate self-control will improve Ability to identify and develop effective coping behaviors will improve Ability to maintain clinical measurements within normal limits will improve Compliance with prescribed medications will improve Ability to identify triggers associated with substance abuse/mental health  issues will improve     Medication Management: Evaluate patient's response, side effects, and tolerance of medication regimen.  Therapeutic Interventions: 1 to 1 sessions, Unit Group sessions and Medication administration.  Evaluation of Outcomes: Not Met   RN Treatment Plan for Primary Diagnosis: Bipolar 1 disorder ([place]) Long Term Goal(s): Knowledge of disease and therapeutic regimen to maintain health will improve  Short Term Goals: Ability to remain free from injury will improve, Ability to verbalize frustration and anger appropriately will improve, Ability to demonstrate self-control, Ability to participate in decision making will improve, Ability to verbalize feelings will improve, Ability to identify and develop effective coping behaviors will improve, and Compliance with prescribed medications will improve  Medication Management: RN will administer medications as ordered by provider, will assess and evaluate patient's response and provide education to patient for prescribed medication. RN will report any adverse and/or side effects to prescribing provider.  Therapeutic Interventions: 1 on 1 counseling sessions, Psychoeducation, Medication administration, Evaluate responses to treatment, Monitor vital signs and CBGs as ordered, Perform/monitor CIWA, COWS, AIMS and Fall Risk screenings as ordered, Perform wound care treatments as ordered.  Evaluation of Outcomes: Not Met   LCSW Treatment Plan for Primary Diagnosis: Bipolar 1 disorder ([place]) Long Term Goal(s): Safe transition to appropriate next level of care at discharge, Engage patient in therapeutic group addressing interpersonal concerns.  Short Term Goals: Engage patient in aftercare planning with referrals and resources, Increase social support, Increase ability to appropriately verbalize feelings, Increase emotional regulation, Facilitate acceptance of mental health diagnosis and concerns, and Increase skills for wellness and  recovery  Therapeutic Interventions: Assess for all discharge needs, 1 to 1 time with Social worker, Explore available resources and support systems, Assess for adequacy in community support network, Educate family and significant other(s) on suicide prevention, Complete Psychosocial Assessment, Interpersonal group therapy.  Evaluation of Outcomes: Not Met   Progress in Treatment: Attending groups: No. Participating in groups: No. Taking medication as prescribed: Yes. Toleration medication: Yes. Family/Significant other contact made: No, will contact:  when given permission Patient understands diagnosis: No. Discussing patient identified problems/goals with staff: Yes. Medical problems stabilized or resolved: No. Denies suicidal/homicidal ideation: Yes. Issues/concerns per patient self-inventory: No. Other: None  New problem(s) identified: No, Describe:  None  New Short Term/Long Term Goal(s): Patient to work towards  elimination of symptoms of psychosis, medication management for mood stabilization; elimination of SI thoughts; development of comprehensive mental wellness plan.   Patient Goals:  "need a psychiatrist"  Discharge Plan or Barriers: CSW will assist pt with development of appropriate discharge/aftercare plan.   Reason for Continuation of Hospitalization: Delusions  Mania Medication stabilization  Estimated Length of Stay: 1-7 days  Last 3 [place] Suicide Severity Risk Score: Flowsheet Row Admission (Current) from 07/20/2022 in [facility] ED from 07/19/2022 in [facility] from 05/22/2022 in [place] No Risk No Risk No Risk       Last PHQ 2/9 Scores:    06/25/2022  12:35 PM 05/13/2022    2:10 PM  Depression screen PHQ 2/9  Decreased Interest 0 0  Down, Depressed, Hopeless 0 0  PHQ - 2 Score 0 0    Scribe for Treatment  Team:  A [place], [place] 07/22/2022 11:15 AM

## 2022-07-22 NOTE — Progress Notes (Signed)
[facility] MD Progress Note  07/22/2022 12:29 PM Shelby Jordan  MRN:  [id number] Subjective: Patient is seen on rounds and was social work.  She is unsteady on her feet so long that I stop her daytime Zyprexa.  She did not have any problems yesterday until the evening.  She has been compliant with medications.  I encouraged her to use a walker.  She is not really keen on it.  Sleep and appetite are good.  No side effects except for maybe some dizziness so we are going to stop her daytime Zyprexa.  Continue Depakote which seems to work for her but she was off of it prior to admission.  Discussed with social work the need for psychiatric follow-up upon discharge.  Principal Problem: Bipolar 1 disorder ([place]) Diagnosis: Principal Problem:   Bipolar 1 disorder ([place])  Total Time spent with patient: 15 minutes  Past Psychiatric History: More than 10 inpatient hospitalizations. She currently does not have a psychiatrist. Long history of bipolar disorder.   Past Medical History:  Past Medical History:  Diagnosis Date   Bipolar 1 disorder ([place])    Depression    Hepatitis C    High cholesterol    Hypertension    UTI (urinary tract infection)     Past Surgical History:  Procedure Laterality Date   APPENDECTOMY     BREAST BIOPSY Right 03/04/2022   stereo bx, mass "X" clip-path pending   TOE AMPUTATION     Family History:  Family History  Problem Relation Age of Onset   Mental illness Mother        born in mental hospital   Breast cancer Neg Hx    Family Psychiatric  History: Unremarkable Social History:  Social History   Substance and Sexual Activity  Alcohol Use Never     Social History   Substance and Sexual Activity  Drug Use Never    Social History   Socioeconomic History   Marital status: Legally Separated    Spouse name: Not on file   Number of children: Not on file   Years of education: Not on file   Highest education level: Not on file  Occupational History   Not on file   Tobacco Use   Smoking status: Former    Packs/day: 1.00    Years: 30.00    Total pack years: 30.00    Types: Cigarettes    Quit date: 2023    Years since quitting: 1.0   Smokeless tobacco: Never  Vaping Use   Vaping Use: Never used  Substance and Sexual Activity   Alcohol use: Never   Drug use: Never   Sexual activity: Not Currently    Partners: Male  Other Topics Concern   Not on file  Social History Narrative   Not on file   Social Determinants of Health   Financial Resource Strain: Low Risk  (05/13/2022)   Overall Financial Resource Strain (CARDIA)    Difficulty of Paying Living Expenses: Not hard at all  Food Insecurity: No Food Insecurity (07/20/2022)   Hunger Vital Sign    Worried About Running Out of Food in the Last Year: Never true    Ran Out of Food in the Last Year: Never true  Transportation Needs: Unmet Transportation Needs (07/20/2022)   PRAPARE - Transportation    Lack of Transportation (Medical): Yes    Lack of Transportation (Non-Medical): Yes  Physical Activity: Inactive (05/13/2022)   Exercise Vital Sign    Days of Exercise  per Week: 0 days    Minutes of Exercise per Session: 0 min  Stress: No Stress Concern Present (05/13/2022)   [place]    Feeling of Stress : Not at all  Social Connections: Moderately Integrated (05/13/2022)   Social Connection and Isolation Panel [NHANES]    Frequency of Communication with Friends and Family: More than three times a week    Frequency of Social Gatherings with Friends and Family: More than three times a week    Attends Religious Services: More than 4 times per year    Active Member of [employer] or Organizations: Yes    Attends Music therapist: More than 4 times per year    Marital Status: Separated   Additional Social History:                         Sleep: Good  Appetite:  Good  Current Medications: Current  Facility-Administered Medications  Medication Dose Route Frequency Provider Last Rate Last Admin   acetaminophen (TYLENOL) tablet 650 mg  650 mg Oral Q6H PRN Sherlon Handing, NP       alum & mag hydroxide-simeth (MAALOX/MYLANTA) 200-200-20 MG/5ML suspension 30 mL  30 mL Oral Q4H PRN Waldon Merl F, NP       amLODipine (NORVASC) tablet 5 mg  5 mg Oral Daily Parks Ranger, DO   5 mg at 07/22/22 1004   divalproex (DEPAKOTE) DR tablet 500 mg  500 mg Oral BID Waldon Merl F, NP   500 mg at 07/22/22 1004   doxylamine (Sleep) (UNISOM) tablet 25 mg  25 mg Oral QHS PRN Waldon Merl F, NP   25 mg at 07/21/22 2144   ibuprofen (ADVIL) tablet 600 mg  600 mg Oral Q6H PRN Waldon Merl F, NP   600 mg at 07/21/22 0109   LORazepam (ATIVAN) tablet 1 mg  1 mg Oral Q4H PRN Parks Ranger, DO   1 mg at 07/21/22 1224   magnesium gluconate (MAGONATE) tablet 500 mg  500 mg Oral Daily Parks Ranger, DO   500 mg at 07/22/22 1006   magnesium hydroxide (MILK OF MAGNESIA) suspension 30 mL  30 mL Oral Daily PRN Sherlon Handing, NP       OLANZapine (ZYPREXA) tablet 10 mg  10 mg Oral QHS Parks Ranger, DO       traZODone (DESYREL) tablet 50 mg  50 mg Oral QHS Waldon Merl F, NP   50 mg at 07/21/22 2144    Lab Results:  Results for orders placed or performed during the hospital encounter of 07/20/22 (from the past 48 hour(s))  Glucose, capillary     Status: None   Collection Time: 07/21/22  5:10 PM  Result Value Ref Range   Glucose-Capillary 86 70 - 99 mg/dL    Comment: Glucose reference range applies only to samples taken after fasting for at least 8 hours.  Glucose, capillary     Status: None   Collection Time: 07/22/22  7:41 AM  Result Value Ref Range   Glucose-Capillary 99 70 - 99 mg/dL    Comment: Glucose reference range applies only to samples taken after fasting for at least 8 hours.  Glucose, capillary     Status: Abnormal   Collection Time: 07/22/22  11:33 AM  Result Value Ref Range   Glucose-Capillary 106 (H) 70 - 99 mg/dL    Comment: Glucose reference range  applies only to samples taken after fasting for at least 8 hours.    Blood Alcohol level:  Lab Results  Component Value Date   ETH <10 07/19/2022   ETH <10 59/93/5701    Metabolic Disorder Labs: Lab Results  Component Value Date   HGBA1C 6.3 11/28/2021   MPG 134.11 12/14/2019   No results found for: "PROLACTIN" Lab Results  Component Value Date   CHOL 233 (H) 11/28/2021   TRIG 133.0 11/28/2021   HDL 71.50 11/28/2021   CHOLHDL 3 11/28/2021   VLDL 26.6 11/28/2021   LDLCALC 135 (H) 11/28/2021    Physical Findings: AIMS:  , ,  ,  ,    CIWA:    COWS:     Musculoskeletal: Strength & Muscle Tone: within normal limits Gait & Station: normal Patient leans: N/A  Psychiatric Specialty Exam:  Presentation  General Appearance:  Appropriate for Environment; Casual  Eye Contact: Fair  Speech: Clear and Coherent  Speech Volume: Increased  Handedness: Right   Mood and Affect  Mood: Anxious; Labile  Affect: Inappropriate; Full Range   Thought Process  Thought Processes: Disorganized; Irrevelant  Descriptions of Associations:Loose  Orientation:Partial  Thought Content:Illogical; Scattered  History of Schizophrenia/Schizoaffective disorder:Yes  Duration of Psychotic Symptoms:Greater than six months  Hallucinations:No data recorded Ideas of Reference:Paranoia  Suicidal Thoughts:No data recorded Homicidal Thoughts:No data recorded  Sensorium  Memory: Immediate Poor; Remote Poor  Judgment: Impaired  Insight: Lacking   Executive Functions  Concentration: Poor  Attention Span: Poor  Recall: Poor  Fund of Knowledge: Poor  Language: Poor   Psychomotor Activity  Psychomotor Activity:No data recorded  Assets  Assets: Desire for Improvement; Financial Resources/Insurance; Housing; Social Support   Sleep  Sleep:No  data recorded   Physical Exam: Physical Exam Vitals and nursing note reviewed.  Constitutional:      Appearance: Normal appearance. She is normal weight.  Neurological:     General: No focal deficit present.     Mental Status: She is alert and oriented to person, place, and time.  Psychiatric:        Attention and Perception: Attention and perception normal.        Mood and Affect: Mood and affect normal.        Speech: Speech normal.        Behavior: Behavior normal. Behavior is cooperative.        Thought Content: Thought content normal.        Cognition and Memory: Cognition and memory normal.        Judgment: Judgment is impulsive.    Review of Systems  Constitutional: Negative.   HENT: Negative.    Eyes: Negative.   Respiratory: Negative.    Cardiovascular: Negative.   Gastrointestinal: Negative.   Genitourinary: Negative.   Musculoskeletal: Negative.   Skin: Negative.   Neurological: Negative.   Endo/Heme/Allergies: Negative.   Psychiatric/Behavioral: Negative.     Blood pressure (!) 146/101, pulse 95, temperature 98.3 F (36.8 C), temperature source Oral, resp. rate 16, SpO2 99 %. There is no height or weight on file to calculate BMI.   Treatment Plan Summary: Daily contact with patient to assess and evaluate symptoms and progress in treatment, Medication management, and Plan continue current medications.  Discontinue the daytime Zyprexa and change it to 10 mg at bedtime only.  [place] Ranger, DO 07/22/2022, 12:29 PM

## 2022-07-22 NOTE — Progress Notes (Signed)
At 5:30 AM,  patient heard yelling out help.  Went down to patients room and found patient stuck between the side rails on the left side of the bed.  Patient was trying to get out of bed to go to bathroom but was getting out on the wrong side.  Patient's bed was saturated with urine  Patient assisted to shower and bed cleaned and clean linen applied.  Patient has been up since this time talking very loudly and having to be redirected several times.

## 2022-07-22 NOTE — Progress Notes (Signed)
Patient Patient is cooperative but preoccupied during assessment.  Patient is medication compliant.  Patient denies anxiety, depression, SI/HI and AVH.  However, patient does appear to be responding at times.  Patient has been out of room in dayroom but isolated to self.  Patient had an episode where she became very confused and had to be assisted to bathroom and back to bed by this nurse and nurse tech.  Patient denies pain Patient can contract for safety.  Q 15 minute rounds in progress, will continue to monitor.

## 2022-07-22 NOTE — BHH Group Notes (Signed)
[place] Group Notes:  (Nursing/MHT/Case Management/Adjunct)  Date:  07/22/2022  Time:  11:11 AM  Type of Therapy:   Community Meeting  Participation Level:  Did Not Attend   Shelby Jordan A Geraldean Walen 07/22/2022, 11:11 AM

## 2022-07-22 NOTE — Group Note (Signed)
LCSW Group Therapy Note  Group Date: 07/22/2022 Start Time: 1300 End Time: 1400   Type of Therapy and Topic:  Group Therapy - How To Cope with Nervousness about Discharge   Participation Level:  None   Description of Group This process group involved identification of patients' feelings about discharge. Some of them are scheduled to be discharged soon, while others are new admissions, but each of them was asked to share thoughts and feelings surrounding discharge from the hospital. One common theme was that they are excited at the prospect of going home, while another was that many of them are apprehensive about sharing why they were hospitalized. Patients were given the opportunity to discuss these feelings with their peers in preparation for discharge.  Therapeutic Goals  Patient will identify their overall feelings about pending discharge. Patient will think about how they might proactively address issues that they believe will once again arise once they get home (i.e. with parents). Patients will participate in discussion about having hope for change.   Summary of Patient Progress:   Patient was present for part of group. Any contributions were tangential and not related to topic, requires constant redirection   Therapeutic Modalities Cognitive Behavioral Therapy   Larose Kells 07/22/2022  2:01 PM

## 2022-07-22 NOTE — [facility] Suicide Risk Assessment (Signed)
[place] INPATIENT:  Family/Significant Other Suicide Prevention Education  Suicide Prevention Education:  Education Completed; Lawshawn Thompkins,[phone], daughter  has been identified by the patient as the family member/significant other with whom the patient will be residing, and identified as the person(s) who will aid the patient in the event of a mental health crisis (suicidal ideations/suicide attempt).  With written consent from the patient, the family member/significant other has been provided the following suicide prevention education, prior to the and/or following the discharge of the patient.  The suicide prevention education provided includes the following: Suicide risk factors Suicide prevention and interventions National Suicide Hotline telephone number [facility] assessment telephone number [facility] Emergency Assistance [place] and/or Residential Mobile Crisis Unit telephone number  Request made of family/significant other to: Remove weapons (e.g., guns, rifles, knives), all items previously/currently identified as safety concern.   Remove drugs/medications (over-the-counter, prescriptions, illicit drugs), all items previously/currently identified as a safety concern.  The family member/significant other verbalizes understanding of the suicide prevention education information provided.  The family member/significant other agrees to remove the items of safety concern listed above.  She stated that pt has been a harm to herself at times, but it is because she puts herself in harms way but does not realize it. She said pt will wander or get in the car with strangers to be ridden around town. She stated that pt's primary provider said pt has been showing signs of "the beginnings of dementia." She stated that pt also has a passed diagnosis of schizophrenia and believes it may still be part of her psych profile. She said that pt was supposed to be seen at  [facility] but they said they are not currently providing medication management and that they have struggled to find a outpatient medication management provider. She said she or another family member can provide transportation at discharge.   Lelend Heinecke A [place] 07/22/2022, 1:44 PM

## 2022-07-22 NOTE — Patient Outreach (Signed)
  Care Coordination   Follow Up Visit Note   07/22/2022 Name: Shelby Jordan MRN: [id number] DOB: 11-Mar-1951  Shelby Jordan is a 72 y.o. year old female who sees Shelby Pancoast, FNP for primary care. I spoke with  Shelby Jordan 's daughter by phone today.  What matters to the patients health and wellness today?  Follow up from behavioral health inpatient stay    Goals Addressed             This Visit's Progress    In home care needs       Care Coordination Interventions: Follow up phone call to patient's daughter stating that patient has now been admitted to the behavioral health hospital  Patient's daughter hopeful that patient can get stable on her medications before discharge Possible follow up at [facility] Patient's daughter agreeable to to contacting this social worker to assist with follow up post discharge from the [facility]        SDOH assessments and interventions completed:  No     Care Coordination Interventions:  Yes, provided   Follow up plan: Follow up call scheduled for 08/05/22    Encounter Outcome:  Pt. Visit Completed

## 2022-07-22 NOTE — Plan of Care (Signed)
D- Patient alert and oriented. Patient presents with a needy and intrusive mood and affect. Patient at the nurses station multiple times interjecting progression meeting and other patients assessments. Patient denies SI, HI, AVH, and pain. This Probation officer provided numerous reminders and encouragement for patient to use her walker and not take and eat other patients food. Patient continuously asking for food and trying to eat others.   A- Scheduled medications administered to patient, per MD orders. Support and encouragement provided.  Routine safety checks conducted every 15 minutes.  Patient informed to notify staff with problems or concerns.  R- No adverse drug reactions noted. Patient contracts for safety at this time. Patient compliant with medications and treatment plan. Patient receptive, calm, and cooperative. Patient interacts well with others on the unit.  Patient remains safe at this time.  Problem: Education: Goal: Knowledge of General Education information will improve Description: Including pain rating scale, medication(s)/side effects and non-pharmacologic comfort measures Outcome: Not Progressing   Problem: Health Behavior/Discharge Planning: Goal: Ability to manage health-related needs will improve Outcome: Not Progressing   Problem: Clinical Measurements: Goal: Ability to maintain clinical measurements within normal limits will improve Outcome: Not Progressing Goal: Respiratory complications will improve Outcome: Progressing Goal: Cardiovascular complication will be avoided Outcome: Progressing   Problem: Nutrition: Goal: Adequate nutrition will be maintained Outcome: Progressing   Problem: Elimination: Goal: Will not experience complications related to bowel motility Outcome: Progressing Goal: Will not experience complications related to urinary retention Outcome: Progressing

## 2022-07-22 NOTE — Patient Instructions (Addendum)
Visit Information  Thank you for taking time to visit with me today. Please don't hesitate to contact me if I can be of assistance to you.   Following are the goals we discussed today:   Goals Addressed             This Visit's Progress    In home care needs       Care Coordination Interventions: Follow up phone call to patient's daughter stating that patient has now been admitted to the behavioral health hospital  Patient's daughter hopeful that patient can get stable on her medications before discharge Possible follow up at [facility] Patient's daughter agreeable to to contacting this social worker to assist with follow up post discharge from the [facility] next appointment is by telephone on 08/05/22 at 2:30pm  Please call the care guide team at [phone] if you need to cancel or reschedule your appointment.   If you are experiencing a Mental Health or [place] or need someone to talk to, please call the Suicide and Crisis Lifeline: 988   Patient verbalizes understanding of instructions and care plan provided today and agrees to view in [place]. Active MyChart status and patient understanding of how to access instructions and care plan via MyChart confirmed with patient.     Telephone follow up appointment with care management team member scheduled for: 08/05/22  Elliot Gurney, [place] Worker  [facility] Care Management [phone]

## 2022-07-23 DIAGNOSIS — F319 Bipolar disorder, unspecified: Secondary | ICD-10-CM | POA: Diagnosis not present

## 2022-07-23 LAB — GLUCOSE, CAPILLARY
Glucose-Capillary: 94 mg/dL (ref 70–99)
Glucose-Capillary: 83 mg/dL (ref 70–99)

## 2022-07-23 NOTE — Group Note (Signed)
LCSW Group Therapy Note   Group Date: 07/23/2022 Start Time: 1300 End Time: 1400   Type of Therapy/Topic:  Group Therapy: Emotional Regulation        Participation Level:  Active       Description of Group:     Group activity was held to utilize coping skills to help deal with strong emotions. Positive recreational activity and mindfulness were practiced to assist with distraction/amelioration of negative emotional outbursts and overload.        Therapeutic Goals:   1. Patient will identify positive activities to help distract/ameliorate negative emotions.   2. Patient will demonstrate positive leisure activity/mindfulness practice.       Summary of Patient Progress:   Patient was present for the entirety of the group session. Patient was an active listener and participated in the activity. CSW facilitated recreational games and activities during group session. Patient was normal mood but somewhat disruptive.       Therapeutic Modalities:   Mindfulness   Positive leisure       Feleshia Zundel A [place], [place] 07/23/2022  3:06 PM

## 2022-07-23 NOTE — Plan of Care (Signed)
Pt denies anxiety however, endorses depression at this time. 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 Pt is calm and cooperative. Pt is medication compliant. Pt provided with support and encouragement. Pt monitored q15 minutes for safety per unit policy. Plan of care ongoing.   This morning pt had to be redirected and reminded to use her walker. Pt observed moments later by staff placing herself on the floor. Pt was helped up and given a wheelchair. Pt had to be continuously reminded to use her wheelchair and not to stand or walk unattended or unassisted. During morning medication administration pt asked if she was on a 1:1. Pt was informed she was not on a 1:1, it was not necessary at this time. Pt needs much redirections at this time.   Problem: Coping: Goal: Level of anxiety will decrease Outcome: Progressing   Problem: Education: Goal: Knowledge of General Education information will improve Description: Including pain rating scale, medication(s)/side effects and non-pharmacologic comfort measures Outcome: Not Progressing

## 2022-07-23 NOTE — Progress Notes (Signed)
Patient reports sleeping well and denies pain/discomfort from rolling off the bed last night. Patient is stable and in good condition. Patient was unable to walk this morning and use a wheel chair to the dayroom. Patient's daughter Sherlynn Carbon was notified about the patient's fall this morning.

## 2022-07-23 NOTE — Progress Notes (Signed)
[facility] MD Progress Note  07/23/2022 3:09 PM Shelby Jordan  MRN:  [id number] Subjective: Shelby Jordan is seen on rounds.  She has been pleasant and cooperative.  She has been compliant with medications and denies any side effects.  Nurses report that she has been a little gamy and seems to be falling to the floor on her own.  They state that she asks for a one-to-one so it appears that she is looking for attention but otherwise is doing well.  Principal Problem: Bipolar 1 disorder ([place]) Diagnosis: Principal Problem:   Bipolar 1 disorder ([place])  Total Time spent with patient: 15 minutes  Past Psychiatric History: More than 10 inpatient hospitalizations. She currently does not have a psychiatrist. Long history of bipolar disorder.    Past Medical History:  Past Medical History:  Diagnosis Date   Bipolar 1 disorder ([place])    Depression    Hepatitis C    High cholesterol    Hypertension    UTI (urinary tract infection)     Past Surgical History:  Procedure Laterality Date   APPENDECTOMY     BREAST BIOPSY Right 03/04/2022   stereo bx, mass "X" clip-path pending   TOE AMPUTATION     Family History:  Family History  Problem Relation Age of Onset   Mental illness Mother        born in mental hospital   Breast cancer Neg Hx    Family Psychiatric  History: Unremarkable Social History:  Social History   Substance and Sexual Activity  Alcohol Use Never     Social History   Substance and Sexual Activity  Drug Use Never    Social History   Socioeconomic History   Marital status: Legally Separated    Spouse name: Not on file   Number of children: Not on file   Years of education: Not on file   Highest education level: Not on file  Occupational History   Not on file  Tobacco Use   Smoking status: Former    Packs/day: 1.00    Years: 30.00    Total pack years: 30.00    Types: Cigarettes    Quit date: 2023    Years since quitting: 1.0   Smokeless tobacco: Never  Vaping Use    Vaping Use: Never used  Substance and Sexual Activity   Alcohol use: Never   Drug use: Never   Sexual activity: Not Currently    Partners: Male  Other Topics Concern   Not on file  Social History Narrative   Not on file   Social Determinants of Health   Financial Resource Strain: Low Risk  (05/13/2022)   Overall Financial Resource Strain (CARDIA)    Difficulty of Paying Living Expenses: Not hard at all  Food Insecurity: No Food Insecurity (07/20/2022)   Hunger Vital Sign    Worried About Running Out of Food in the Last Year: Never true    Ranger in the Last Year: Never true  Transportation Needs: Unmet Transportation Needs (07/20/2022)   PRAPARE - Transportation    Lack of Transportation (Medical): Yes    Lack of Transportation (Non-Medical): Yes  Physical Activity: Inactive (05/13/2022)   Exercise Vital Sign    Days of Exercise per Week: 0 days    Minutes of Exercise per Session: 0 min  Stress: No Stress Concern Present (05/13/2022)   [place]    Feeling of Stress : Not at all  Social Connections: Moderately Integrated (05/13/2022)   Social Connection and Isolation Panel [NHANES]    Frequency of Communication with Friends and Family: More than three times a week    Frequency of Social Gatherings with Friends and Family: More than three times a week    Attends Religious Services: More than 4 times per year    Active Member of [employer] or Organizations: Yes    Attends Music therapist: More than 4 times per year    Marital Status: Separated   Additional Social History:                         Sleep: Good  Appetite:  Good  Current Medications: Current Facility-Administered Medications  Medication Dose Route Frequency Provider Last Rate Last Admin   acetaminophen (TYLENOL) tablet 650 mg  650 mg Oral Q6H PRN Sherlon Handing, NP       alum & mag hydroxide-simeth  (MAALOX/MYLANTA) 200-200-20 MG/5ML suspension 30 mL  30 mL Oral Q4H PRN Waldon Merl F, NP       amLODipine (NORVASC) tablet 5 mg  5 mg Oral Daily Parks Ranger, DO   5 mg at 07/23/22 0901   divalproex (DEPAKOTE) DR tablet 500 mg  500 mg Oral BID Waldon Merl F, NP   500 mg at 07/23/22 0901   doxylamine (Sleep) (UNISOM) tablet 25 mg  25 mg Oral QHS PRN Waldon Merl F, NP   25 mg at 07/21/22 2144   ibuprofen (ADVIL) tablet 600 mg  600 mg Oral Q6H PRN Waldon Merl F, NP   600 mg at 07/21/22 0109   LORazepam (ATIVAN) tablet 1 mg  1 mg Oral Q4H PRN Parks Ranger, DO   1 mg at 07/22/22 2048   magnesium gluconate (MAGONATE) tablet 500 mg  500 mg Oral Daily Parks Ranger, DO   500 mg at 07/23/22 0901   magnesium hydroxide (MILK OF MAGNESIA) suspension 30 mL  30 mL Oral Daily PRN Sherlon Handing, NP       OLANZapine (ZYPREXA) tablet 10 mg  10 mg Oral QHS Parks Ranger, DO   10 mg at 07/22/22 2048   traZODone (DESYREL) tablet 50 mg  50 mg Oral QHS Sherlon Handing, NP   50 mg at 07/22/22 2048    Lab Results:  Results for orders placed or performed during the hospital encounter of 07/20/22 (from the past 48 hour(s))  Glucose, capillary     Status: None   Collection Time: 07/21/22  5:10 PM  Result Value Ref Range   Glucose-Capillary 86 70 - 99 mg/dL    Comment: Glucose reference range applies only to samples taken after fasting for at least 8 hours.  Glucose, capillary     Status: None   Collection Time: 07/22/22  7:41 AM  Result Value Ref Range   Glucose-Capillary 99 70 - 99 mg/dL    Comment: Glucose reference range applies only to samples taken after fasting for at least 8 hours.  Glucose, capillary     Status: Abnormal   Collection Time: 07/22/22 11:33 AM  Result Value Ref Range   Glucose-Capillary 106 (H) 70 - 99 mg/dL    Comment: Glucose reference range applies only to samples taken after fasting for at least 8 hours.  Glucose,  capillary     Status: Abnormal   Collection Time: 07/22/22  4:13 PM  Result Value Ref Range   Glucose-Capillary 121 (H) 70 -  99 mg/dL    Comment: Glucose reference range applies only to samples taken after fasting for at least 8 hours.  Glucose, capillary     Status: None   Collection Time: 07/23/22  7:46 AM  Result Value Ref Range   Glucose-Capillary 94 70 - 99 mg/dL    Comment: Glucose reference range applies only to samples taken after fasting for at least 8 hours.    Blood Alcohol level:  Lab Results  Component Value Date   ETH <10 07/19/2022   ETH <10 74/94/4967    Metabolic Disorder Labs: Lab Results  Component Value Date   HGBA1C 6.3 11/28/2021   MPG 134.11 12/14/2019   No results found for: "PROLACTIN" Lab Results  Component Value Date   CHOL 233 (H) 11/28/2021   TRIG 133.0 11/28/2021   HDL 71.50 11/28/2021   CHOLHDL 3 11/28/2021   VLDL 26.6 11/28/2021   LDLCALC 135 (H) 11/28/2021    Physical Findings: AIMS:  , ,  ,  ,    CIWA:    COWS:     Musculoskeletal: Strength & Muscle Tone: within normal limits Gait & Station: normal Patient leans: N/A  Psychiatric Specialty Exam:  Presentation  General Appearance:  Appropriate for Environment; Casual  Eye Contact: Fair  Speech: Clear and Coherent  Speech Volume: Increased  Handedness: Right   Mood and Affect  Mood: Anxious; Labile  Affect: Inappropriate; Full Range   Thought Process  Thought Processes: Disorganized; Irrevelant  Descriptions of Associations:Loose  Orientation:Partial  Thought Content:Illogical; Scattered  History of Schizophrenia/Schizoaffective disorder:Yes  Duration of Psychotic Symptoms:Greater than six months  Hallucinations:No data recorded Ideas of Reference:Paranoia  Suicidal Thoughts:No data recorded Homicidal Thoughts:No data recorded  Sensorium  Memory: Immediate Poor; Remote Poor  Judgment: Impaired  Insight: Lacking   Executive Functions   Concentration: Poor  Attention Span: Poor  Recall: Poor  Fund of Knowledge: Poor  Language: Poor   Psychomotor Activity  Psychomotor Activity:No data recorded  Assets  Assets: Desire for Improvement; Financial Resources/Insurance; Housing; Social Support   Sleep  Sleep:No data recorded   Physical Exam: Physical Exam Vitals and nursing note reviewed.  Constitutional:      Appearance: Normal appearance. She is normal weight.  Neurological:     General: No focal deficit present.     Mental Status: She is alert and oriented to person, place, and time.  Psychiatric:        Attention and Perception: Attention and perception normal.        Mood and Affect: Mood normal. Affect is labile.        Speech: Speech normal.        Behavior: Behavior normal. Behavior is cooperative.        Thought Content: Thought content normal.        Cognition and Memory: Cognition and memory normal.        Judgment: Judgment is impulsive and inappropriate.    Review of Systems  Constitutional: Negative.   HENT: Negative.    Eyes: Negative.   Respiratory: Negative.    Cardiovascular: Negative.   Gastrointestinal: Negative.   Genitourinary: Negative.   Musculoskeletal: Negative.   Skin: Negative.   Neurological: Negative.   Endo/Heme/Allergies: Negative.   Psychiatric/Behavioral: Negative.     Blood pressure (!) 161/125, pulse 91, temperature 98 F (36.7 C), resp. rate 18, SpO2 99 %. There is no height or weight on file to calculate BMI.   Treatment Plan Summary: Daily contact with patient to assess  and evaluate symptoms and progress in treatment, Medication management, and Plan continue current medications.  Parks Ranger, DO 07/23/2022, 3:09 PM

## 2022-07-23 NOTE — Progress Notes (Signed)
D- Patient found on floor during 15 minute safety checks at 2300 laying facing the wall to the bathroom. Patient said she was trying to roll over in bed and fell. Patient denied any injury. Patient was alert and oriented x 4, but sleepy.     A- Vitals and assessment was performed, alarm turned on and side rails put in place x 3. Nurse practitioner on call was notified and no new orders given. Routine safety checks conducted every 15 minutes. Patient informed to notify staff incase she develops pain, injury or discomfort related with the fall.   R- No injury  noted. Patient contracts for safety at this time. Patient compliant with medications and treatment plan. Patient receptive, calm, and cooperative. Patient went back to bed and was observed eyes closed, with unlabored respirations and no apparent distress noted.

## 2022-07-24 DIAGNOSIS — F319 Bipolar disorder, unspecified: Secondary | ICD-10-CM | POA: Diagnosis not present

## 2022-07-24 LAB — GLUCOSE, CAPILLARY
Glucose-Capillary: 100 mg/dL — ABNORMAL HIGH (ref 70–99)
Glucose-Capillary: 108 mg/dL — ABNORMAL HIGH (ref 70–99)
Glucose-Capillary: 115 mg/dL — ABNORMAL HIGH (ref 70–99)

## 2022-07-24 MED ORDER — AMLODIPINE BESYLATE 5 MG PO TABS
10.0000 mg | ORAL_TABLET | Freq: Every day | ORAL | Status: DC
  Administered 2022-07-25 – 2022-07-27 (×3): 10 mg via ORAL
  Filled 2022-07-24 (×3): qty 2

## 2022-07-24 NOTE — Progress Notes (Signed)
[facility] MD Progress Note  07/24/2022 12:47 PM Shelby Jordan  MRN:  [id number] Subjective: Shelby Jordan is seen on rounds.  Nurses tell me that she needs a lot of free direction.  She tells me that she slept well last night.  Her appetite is good.  She denies depression or suicidal ideation.  She has been compliant with medications and denies any side effects.  He seems to be in a good mood. Principal Problem: Bipolar 1 disorder ([place]) Diagnosis: Principal Problem:   Bipolar 1 disorder ([place])  Total Time spent with patient: 15 minutes  Past Psychiatric History: More than 10 inpatient hospitalizations. She currently does not have a psychiatrist. Long history of bipolar disorder.    Past Medical History:  Past Medical History:  Diagnosis Date   Bipolar 1 disorder ([place])    Depression    Hepatitis C    High cholesterol    Hypertension    UTI (urinary tract infection)     Past Surgical History:  Procedure Laterality Date   APPENDECTOMY     BREAST BIOPSY Right 03/04/2022   stereo bx, mass "X" clip-path pending   TOE AMPUTATION     Family History:  Family History  Problem Relation Age of Onset   Mental illness Mother        born in mental hospital   Breast cancer Neg Hx    Family Psychiatric  History: Unremarkable Social History:  Social History   Substance and Sexual Activity  Alcohol Use Never     Social History   Substance and Sexual Activity  Drug Use Never    Social History   Socioeconomic History   Marital status: Legally Separated    Spouse name: Not on file   Number of children: Not on file   Years of education: Not on file   Highest education level: Not on file  Occupational History   Not on file  Tobacco Use   Smoking status: Former    Packs/day: 1.00    Years: 30.00    Total pack years: 30.00    Types: Cigarettes    Quit date: 2023    Years since quitting: 1.0   Smokeless tobacco: Never  Vaping Use   Vaping Use: Never used  Substance and Sexual Activity    Alcohol use: Never   Drug use: Never   Sexual activity: Not Currently    Partners: Male  Other Topics Concern   Not on file  Social History Narrative   Not on file   Social Determinants of Health   Financial Resource Strain: Low Risk  (05/13/2022)   Overall Financial Resource Strain (CARDIA)    Difficulty of Paying Living Expenses: Not hard at all  Food Insecurity: No Food Insecurity (07/20/2022)   Hunger Vital Sign    Worried About Running Out of Food in the Last Year: Never true    [place] in the Last Year: Never true  Transportation Needs: Unmet Transportation Needs (07/20/2022)   PRAPARE - Transportation    Lack of Transportation (Medical): Yes    Lack of Transportation (Non-Medical): Yes  Physical Activity: Inactive (05/13/2022)   Exercise Vital Sign    Days of Exercise per Week: 0 days    Minutes of Exercise per Session: 0 min  Stress: No Stress Concern Present (05/13/2022)   [place]    Feeling of Stress : Not at all  Social Connections: Moderately Integrated (05/13/2022)   Social Connection  and Isolation Panel [NHANES]    Frequency of Communication with Friends and Family: More than three times a week    Frequency of Social Gatherings with Friends and Family: More than three times a week    Attends Religious Services: More than 4 times per year    Active Member of [employer] or Organizations: Yes    Attends Music therapist: More than 4 times per year    Marital Status: Separated   Additional Social History:                         Sleep: Good  Appetite:  Good  Current Medications: Current Facility-Administered Medications  Medication Dose Route Frequency Provider Last Rate Last Admin   acetaminophen (TYLENOL) tablet 650 mg  650 mg Oral Q6H PRN Sherlon Handing, NP       alum & mag hydroxide-simeth (MAALOX/MYLANTA) 200-200-20 MG/5ML suspension 30 mL  30 mL Oral Q4H PRN  Waldon Merl F, NP       amLODipine (NORVASC) tablet 5 mg  5 mg Oral Daily Parks Ranger, DO   5 mg at 07/24/22 0904   divalproex (DEPAKOTE) DR tablet 500 mg  500 mg Oral BID Waldon Merl F, NP   500 mg at 07/24/22 6712   doxylamine (Sleep) (UNISOM) tablet 25 mg  25 mg Oral QHS PRN Waldon Merl F, NP   25 mg at 07/21/22 2144   ibuprofen (ADVIL) tablet 600 mg  600 mg Oral Q6H PRN Waldon Merl F, NP   600 mg at 07/21/22 0109   LORazepam (ATIVAN) tablet 1 mg  1 mg Oral Q4H PRN Parks Ranger, DO   1 mg at 07/22/22 2048   magnesium gluconate (MAGONATE) tablet 500 mg  500 mg Oral Daily Parks Ranger, DO   500 mg at 07/24/22 4580   magnesium hydroxide (MILK OF MAGNESIA) suspension 30 mL  30 mL Oral Daily PRN Sherlon Handing, NP       OLANZapine (ZYPREXA) tablet 10 mg  10 mg Oral QHS Parks Ranger, DO   10 mg at 07/23/22 2114   traZODone (DESYREL) tablet 50 mg  50 mg Oral QHS Waldon Merl F, NP   50 mg at 07/23/22 2114    Lab Results:  Results for orders placed or performed during the hospital encounter of 07/20/22 (from the past 48 hour(s))  Glucose, capillary     Status: Abnormal   Collection Time: 07/22/22  4:13 PM  Result Value Ref Range   Glucose-Capillary 121 (H) 70 - 99 mg/dL    Comment: Glucose reference range applies only to samples taken after fasting for at least 8 hours.  Glucose, capillary     Status: None   Collection Time: 07/23/22  7:46 AM  Result Value Ref Range   Glucose-Capillary 94 70 - 99 mg/dL    Comment: Glucose reference range applies only to samples taken after fasting for at least 8 hours.  Glucose, capillary     Status: None   Collection Time: 07/23/22  4:33 PM  Result Value Ref Range   Glucose-Capillary 83 70 - 99 mg/dL    Comment: Glucose reference range applies only to samples taken after fasting for at least 8 hours.  Glucose, capillary     Status: Abnormal   Collection Time: 07/24/22  7:31 AM  Result  Value Ref Range   Glucose-Capillary 100 (H) 70 - 99 mg/dL    Comment: Glucose  reference range applies only to samples taken after fasting for at least 8 hours.    Blood Alcohol level:  Lab Results  Component Value Date   ETH <10 07/19/2022   ETH <10 88/50/2774    Metabolic Disorder Labs: Lab Results  Component Value Date   HGBA1C 6.3 11/28/2021   MPG 134.11 12/14/2019   No results found for: "PROLACTIN" Lab Results  Component Value Date   CHOL 233 (H) 11/28/2021   TRIG 133.0 11/28/2021   HDL 71.50 11/28/2021   CHOLHDL 3 11/28/2021   VLDL 26.6 11/28/2021   LDLCALC 135 (H) 11/28/2021    Physical Findings: AIMS:  , ,  ,  ,    CIWA:    COWS:     Musculoskeletal: Strength & Muscle Tone: within normal limits Gait & Station: unsteady Patient leans: N/A  Psychiatric Specialty Exam:  Presentation  General Appearance:  Appropriate for Environment; Casual  Eye Contact: Fair  Speech: Clear and Coherent  Speech Volume: Increased  Handedness: Right   Mood and Affect  Mood: Anxious; Labile  Affect: Inappropriate; Full Range   Thought Process  Thought Processes: Disorganized; Irrevelant  Descriptions of Associations:Loose  Orientation:Partial  Thought Content:Illogical; Scattered  History of Schizophrenia/Schizoaffective disorder:Yes  Duration of Psychotic Symptoms:Greater than six months  Hallucinations:No data recorded Ideas of Reference:Paranoia  Suicidal Thoughts:No data recorded Homicidal Thoughts:No data recorded  Sensorium  Memory: Immediate Poor; Remote Poor  Judgment: Impaired  Insight: Lacking   Executive Functions  Concentration: Poor  Attention Span: Poor  Recall: Poor  Fund of Knowledge: Poor  Language: Poor   Psychomotor Activity  Psychomotor Activity:No data recorded  Assets  Assets: Desire for Improvement; Financial Resources/Insurance; Housing; Social Support   Sleep  Sleep:No data  recorded   Physical Exam: Physical Exam Vitals and nursing note reviewed.  Constitutional:      Appearance: Normal appearance. She is normal weight.  Neurological:     General: No focal deficit present.     Mental Status: She is alert and oriented to person, place, and time.  Psychiatric:        Attention and Perception: Attention and perception normal.        Mood and Affect: Mood and affect normal.        Speech: Speech normal.        Behavior: Behavior normal. Behavior is cooperative.        Thought Content: Thought content normal.        Cognition and Memory: Cognition and memory normal.        Judgment: Judgment normal.    Review of Systems  Constitutional: Negative.   HENT: Negative.    Eyes: Negative.   Respiratory: Negative.    Cardiovascular: Negative.   Gastrointestinal: Negative.   Genitourinary: Negative.   Musculoskeletal: Negative.   Skin: Negative.   Neurological: Negative.   Endo/Heme/Allergies: Negative.   Psychiatric/Behavioral: Negative.     Blood pressure (!) 158/89, pulse 87, temperature 98.6 F (37 C), temperature source Oral, resp. rate 18, SpO2 99 %. There is no height or weight on file to calculate BMI.   Treatment Plan Summary: Daily contact with patient to assess and evaluate symptoms and progress in treatment, Medication management, and Plan continue current medications.  Parks Ranger, DO 07/24/2022, 12:47 PM

## 2022-07-24 NOTE — Plan of Care (Signed)
Pt denies anxiety/depression at this time. Pt denies SI/HI/AVH or pain at this time. Pt is calm and cooperative. Pt is medication compliant. Pt provided with support and encouragement. Pt monitored q15 minutes for safety per unit policy. Plan of care ongoing.   Pt had to be reminded not to stand from wheelchair without assistance a few times today.   Problem: Coping: Goal: Level of anxiety will decrease Outcome: Progressing   Problem: Elimination: Goal: Will not experience complications related to bowel motility Outcome: Progressing

## 2022-07-24 NOTE — [facility] Group Notes (Signed)
Patient came to group and played Bingo.

## 2022-07-25 DIAGNOSIS — F319 Bipolar disorder, unspecified: Secondary | ICD-10-CM | POA: Diagnosis not present

## 2022-07-25 LAB — GLUCOSE, CAPILLARY
Glucose-Capillary: 91 mg/dL (ref 70–99)
Glucose-Capillary: 161 mg/dL — ABNORMAL HIGH (ref 70–99)
Glucose-Capillary: 149 mg/dL — ABNORMAL HIGH (ref 70–99)

## 2022-07-25 NOTE — Progress Notes (Signed)
Patient with appropriate affect.  Patient denies anxiety and depression.  Denies SI/HI and AVH. Patient reports she slept well and had a good breakfast.  Compliant with scheduled medications.  15 min checks in place. Patient is safe on the unit.  Patient is present in the milieu.  Appropriate interaction with peers.

## 2022-07-25 NOTE — [facility] Group Notes (Signed)
[facility] Group Notes:  (Nursing/MHT/Case Management/Adjunct)  Date:  07/25/2022  Time:  3:20 PM  Type of Therapy:  BINGO  Participation Level:  Active  Participation Quality:  Appropriate  Affect:  Appropriate  Cognitive:  Alert  Insight:  Good  Engagement in Group:  Engaged  Modes of Intervention:  Activity  Summary of Progress/Problems:  Patient participated in [employer].   Boisvert, Amy T 07/25/2022, 3:20 PM

## 2022-07-25 NOTE — Progress Notes (Signed)
[facility] MD Progress Note  07/25/2022 2:59 PM Shelby Jordan  MRN:  [id number] Subjective: Patient seen and chart reviewed.  Patient with bipolar disorder.  Mostly calm today.  Her only request was that I get in touch with her doctor in [place].  Patient is a little disoriented but redirectable.  Has not been agitated today.  Tolerating medication Principal Problem: Bipolar 1 disorder ([facility]) Diagnosis: Principal Problem:   Bipolar 1 disorder ([place])  Total Time spent with patient: 30 minutes  Past Psychiatric History: Past history of bipolar disorder  Past Medical History:  Past Medical History:  Diagnosis Date   Bipolar 1 disorder ([place])    Depression    Hepatitis C    High cholesterol    Hypertension    UTI (urinary tract infection)     Past Surgical History:  Procedure Laterality Date   APPENDECTOMY     BREAST BIOPSY Right 03/04/2022   stereo bx, mass "X" clip-path pending   TOE AMPUTATION     Family History:  Family History  Problem Relation Age of Onset   Mental illness Mother        born in mental hospital   Breast cancer Neg Hx    Family Psychiatric  History: See previous Social History:  Social History   Substance and Sexual Activity  Alcohol Use Never     Social History   Substance and Sexual Activity  Drug Use Never    Social History   Socioeconomic History   Marital status: Legally Separated    Spouse name: Not on file   Number of children: Not on file   Years of education: Not on file   Highest education level: Not on file  Occupational History   Not on file  Tobacco Use   Smoking status: Former    Packs/day: 1.00    Years: 30.00    Total pack years: 30.00    Types: Cigarettes    Quit date: 2023    Years since quitting: 1.0   Smokeless tobacco: Never  Vaping Use   Vaping Use: Never used  Substance and Sexual Activity   Alcohol use: Never   Drug use: Never   Sexual activity: Not Currently    Partners: Male  Other Topics Concern   Not  on file  Social History Narrative   Not on file   Social Determinants of Health   Financial Resource Strain: Low Risk  (05/13/2022)   Overall Financial Resource Strain (CARDIA)    Difficulty of Paying Living Expenses: Not hard at all  Food Insecurity: No Food Insecurity (07/20/2022)   Hunger Vital Sign    Worried About Running Out of Food in the Last Year: Never true    Ran Out of Food in the Last Year: Never true  Transportation Needs: Unmet Transportation Needs (07/20/2022)   PRAPARE - Transportation    Lack of Transportation (Medical): Yes    Lack of Transportation (Non-Medical): Yes  Physical Activity: Inactive (05/13/2022)   Exercise Vital Sign    Days of Exercise per Week: 0 days    Minutes of Exercise per Session: 0 min  Stress: No Stress Concern Present (05/13/2022)   [place] of Stress : Not at all  Social Connections: Moderately Integrated (05/13/2022)   Social Connection and Isolation Panel [NHANES]    Frequency of Communication with Friends and Family: More than three times a week    Frequency of  Social Gatherings with Friends and Family: More than three times a week    Attends Religious Services: More than 4 times per year    Active Member of [employer] or Organizations: Yes    Attends Music therapist: More than 4 times per year    Marital Status: Separated   Additional Social History:                         Sleep: Fair  Appetite:  Fair  Current Medications: Current Facility-Administered Medications  Medication Dose Route Frequency Provider Last Rate Last Admin   acetaminophen (TYLENOL) tablet 650 mg  650 mg Oral Q6H PRN Sherlon Handing, NP       alum & mag hydroxide-simeth (MAALOX/MYLANTA) 200-200-20 MG/5ML suspension 30 mL  30 mL Oral Q4H PRN Waldon Merl F, NP       amLODipine (NORVASC) tablet 10 mg  10 mg Oral Daily Parks Ranger, DO   10 mg at  07/25/22 0919   divalproex (DEPAKOTE) DR tablet 500 mg  500 mg Oral BID Waldon Merl F, NP   500 mg at 07/25/22 0919   doxylamine (Sleep) (UNISOM) tablet 25 mg  25 mg Oral QHS PRN Waldon Merl F, NP   25 mg at 07/21/22 2144   ibuprofen (ADVIL) tablet 600 mg  600 mg Oral Q6H PRN Waldon Merl F, NP   600 mg at 07/21/22 0109   LORazepam (ATIVAN) tablet 1 mg  1 mg Oral Q4H PRN Parks Ranger, DO   1 mg at 07/22/22 2048   magnesium gluconate (MAGONATE) tablet 500 mg  500 mg Oral Daily Parks Ranger, DO   500 mg at 07/25/22 0919   magnesium hydroxide (MILK OF MAGNESIA) suspension 30 mL  30 mL Oral Daily PRN Sherlon Handing, NP       OLANZapine (ZYPREXA) tablet 10 mg  10 mg Oral QHS Parks Ranger, DO   10 mg at 07/24/22 2106   traZODone (DESYREL) tablet 50 mg  50 mg Oral QHS Sherlon Handing, NP   50 mg at 07/24/22 2106    Lab Results:  Results for orders placed or performed during the hospital encounter of 07/20/22 (from the past 48 hour(s))  Glucose, capillary     Status: None   Collection Time: 07/23/22  4:33 PM  Result Value Ref Range   Glucose-Capillary 83 70 - 99 mg/dL    Comment: Glucose reference range applies only to samples taken after fasting for at least 8 hours.  Glucose, capillary     Status: Abnormal   Collection Time: 07/24/22  7:31 AM  Result Value Ref Range   Glucose-Capillary 100 (H) 70 - 99 mg/dL    Comment: Glucose reference range applies only to samples taken after fasting for at least 8 hours.  Glucose, capillary     Status: Abnormal   Collection Time: 07/24/22  4:14 PM  Result Value Ref Range   Glucose-Capillary 108 (H) 70 - 99 mg/dL    Comment: Glucose reference range applies only to samples taken after fasting for at least 8 hours.  Glucose, capillary     Status: Abnormal   Collection Time: 07/24/22  7:35 PM  Result Value Ref Range   Glucose-Capillary 115 (H) 70 - 99 mg/dL    Comment: Glucose reference range applies only  to samples taken after fasting for at least 8 hours.  Glucose, capillary     Status: None  Collection Time: 07/25/22  7:36 AM  Result Value Ref Range   Glucose-Capillary 91 70 - 99 mg/dL    Comment: Glucose reference range applies only to samples taken after fasting for at least 8 hours.    Blood Alcohol level:  Lab Results  Component Value Date   ETH <10 07/19/2022   ETH <10 46/80/3212    Metabolic Disorder Labs: Lab Results  Component Value Date   HGBA1C 6.3 11/28/2021   MPG 134.11 12/14/2019   No results found for: "PROLACTIN" Lab Results  Component Value Date   CHOL 233 (H) 11/28/2021   TRIG 133.0 11/28/2021   HDL 71.50 11/28/2021   CHOLHDL 3 11/28/2021   VLDL 26.6 11/28/2021   LDLCALC 135 (H) 11/28/2021    Physical Findings: AIMS:  , ,  ,  ,    CIWA:    COWS:     Musculoskeletal: Strength & Muscle Tone: within normal limits Gait & Station: normal Patient leans: N/A  Psychiatric Specialty Exam:  Presentation  General Appearance:  Appropriate for Environment; Casual  Eye Contact: Fair  Speech: Clear and Coherent  Speech Volume: Increased  Handedness: Right   Mood and Affect  Mood: Anxious; Labile  Affect: Inappropriate; Full Range   Thought Process  Thought Processes: Disorganized; Irrevelant  Descriptions of Associations:Loose  Orientation:Partial  Thought Content:Illogical; Scattered  History of Schizophrenia/Schizoaffective disorder:Yes  Duration of Psychotic Symptoms:Greater than six months  Hallucinations:No data recorded Ideas of Reference:Paranoia  Suicidal Thoughts:No data recorded Homicidal Thoughts:No data recorded  Sensorium  Memory: Immediate Poor; Remote Poor  Judgment: Impaired  Insight: Lacking   Executive Functions  Concentration: Poor  Attention Span: Poor  Recall: Poor  Fund of Knowledge: Poor  Language: Poor   Psychomotor Activity  Psychomotor Activity:No data  recorded  Assets  Assets: Desire for Improvement; Financial Resources/Insurance; Housing; Social Support   Sleep  Sleep:No data recorded   Physical Exam: Physical Exam Vitals and nursing note reviewed.  Constitutional:      Appearance: Normal appearance.  HENT:     Head: Normocephalic and atraumatic.     Mouth/Throat:     Pharynx: Oropharynx is clear.  Eyes:     Pupils: Pupils are equal, round, and reactive to light.  Cardiovascular:     Rate and Rhythm: Normal rate and regular rhythm.  Pulmonary:     Effort: Pulmonary effort is normal.     Breath sounds: Normal breath sounds.  Abdominal:     General: Abdomen is flat.     Palpations: Abdomen is soft.  Musculoskeletal:        General: Normal range of motion.  Skin:    General: Skin is warm and dry.  Neurological:     General: No focal deficit present.     Mental Status: She is alert. Mental status is at baseline.  Psychiatric:        Attention and Perception: Attention normal.        Mood and Affect: Mood normal. Affect is blunt.        Speech: Speech normal.        Behavior: Behavior is cooperative.        Thought Content: Thought content normal.        Cognition and Memory: Memory is impaired.    Review of Systems  Constitutional: Negative.   HENT: Negative.    Eyes: Negative.   Respiratory: Negative.    Cardiovascular: Negative.   Gastrointestinal: Negative.   Musculoskeletal: Negative.   Skin: Negative.  Neurological: Negative.   Psychiatric/Behavioral: Negative.     Blood pressure (!) 155/73, pulse 95, temperature 98.6 F (37 C), temperature source Oral, resp. rate 18, SpO2 100 %. There is no height or weight on file to calculate BMI.   Treatment Plan Summary: Daily contact with patient to assess and evaluate symptoms and progress in treatment and Plan tolerating medication.  Vitals stable.  Blood sugar normal.  No change to medication.  Supportive counseling.  Alethia Berthold, MD 07/25/2022, 2:59  PM

## 2022-07-25 NOTE — Plan of Care (Signed)

## 2022-07-25 NOTE — Progress Notes (Signed)
Patient is alert and oriented times 2. Mood and affect appropriate. Patient rates pain as 0/10. She denies SI, HI, and AVH. Patient also denies any feelings of anxiety and depression at this time. Patient states she slept well last night. Evening medicines administered whole by mouth without difficulty. Patient ate snack in day room; appetite was fair. Patient remains on unit with Q15 minute checks in place.

## 2022-07-26 DIAGNOSIS — F319 Bipolar disorder, unspecified: Secondary | ICD-10-CM | POA: Diagnosis not present

## 2022-07-26 LAB — GLUCOSE, CAPILLARY
Glucose-Capillary: 100 mg/dL — ABNORMAL HIGH (ref 70–99)
Glucose-Capillary: 154 mg/dL — ABNORMAL HIGH (ref 70–99)

## 2022-07-26 MED ORDER — ENSURE ENLIVE PO LIQD
237.0000 mL | Freq: Two times a day (BID) | ORAL | Status: DC
  Administered 2022-07-26 – 2022-07-30 (×9): 237 mL via ORAL

## 2022-07-26 NOTE — Plan of Care (Signed)
D- Patient alert and oriented. Patient denies SI, HI, AVH, and pain. Patient encouraged to walk with a normal gait and ambulation as patient was not picking up her feet. Patient compliant after frequent encouragement provided. Patient needy.    A- Scheduled medications administered to patient, per MD orders. Support and encouragement provided.  Routine safety checks conducted every 15 minutes.  Patient informed to notify staff with problems or concerns.  R- No adverse drug reactions noted. Patient contracts for safety at this time. Patient compliant with medications and treatment plan. Patient receptive, calm, and cooperative. Patient interacts well with others on the unit.  Patient remains safe at this time.  Problem: Education: Goal: Knowledge of General Education information will improve Description: Including pain rating scale, medication(s)/side effects and non-pharmacologic comfort measures Outcome: Not Progressing   Problem: Health Behavior/Discharge Planning: Goal: Ability to manage health-related needs will improve Outcome: Not Progressing   Problem: Clinical Measurements: Goal: Diagnostic test results will improve Outcome: Progressing Goal: Respiratory complications will improve Outcome: Progressing Goal: Cardiovascular complication will be avoided Outcome: Progressing   Problem: Activity: Goal: Risk for activity intolerance will decrease Outcome: Progressing   Problem: Coping: Goal: Level of anxiety will decrease Outcome: Progressing   Problem: Elimination: Goal: Will not experience complications related to bowel motility Outcome: Progressing Goal: Will not experience complications related to urinary retention Outcome: Progressing

## 2022-07-26 NOTE — Progress Notes (Signed)
[facility] MD Progress Note  07/26/2022 2:57 PM Shelby Jordan  MRN:  [id number] Subjective: Follow-up 72 year old woman with bipolar disorder.  Still a little scattered but basically taking care of her needs well.  Interacts appropriately with other women on the unit.  No specific complaints other than wanting to make sure she gets ensure Principal Problem: Bipolar 1 disorder ([place]) Diagnosis: Principal Problem:   Bipolar 1 disorder ([place])  Total Time spent with patient: 30 minutes  Past Psychiatric History: Past history of bipolar disorder  Past Medical History:  Past Medical History:  Diagnosis Date   Bipolar 1 disorder ([place])    Depression    Hepatitis C    High cholesterol    Hypertension    UTI (urinary tract infection)     Past Surgical History:  Procedure Laterality Date   APPENDECTOMY     BREAST BIOPSY Right 03/04/2022   stereo bx, mass "X" clip-path pending   TOE AMPUTATION     Family History:  Family History  Problem Relation Age of Onset   Mental illness Mother        born in mental hospital   Breast cancer Neg Hx    Family Psychiatric  History: See previous Social History:  Social History   Substance and Sexual Activity  Alcohol Use Never     Social History   Substance and Sexual Activity  Drug Use Never    Social History   Socioeconomic History   Marital status: Legally Separated    Spouse name: Not on file   Number of children: Not on file   Years of education: Not on file   Highest education level: Not on file  Occupational History   Not on file  Tobacco Use   Smoking status: Former    Packs/day: 1.00    Years: 30.00    Total pack years: 30.00    Types: Cigarettes    Quit date: 2023    Years since quitting: 1.0   Smokeless tobacco: Never  Vaping Use   Vaping Use: Never used  Substance and Sexual Activity   Alcohol use: Never   Drug use: Never   Sexual activity: Not Currently    Partners: Male  Other Topics Concern   Not on file  Social  History Narrative   Not on file   Social Determinants of Health   Financial Resource Strain: Low Risk  (05/13/2022)   Overall Financial Resource Strain (CARDIA)    Difficulty of Paying Living Expenses: Not hard at all  Food Insecurity: No Food Insecurity (07/20/2022)   Hunger Vital Sign    Worried About Running Out of Food in the Last Year: Never true    [place] in the Last Year: Never true  Transportation Needs: Unmet Transportation Needs (07/20/2022)   PRAPARE - Transportation    Lack of Transportation (Medical): Yes    Lack of Transportation (Non-Medical): Yes  Physical Activity: Inactive (05/13/2022)   Exercise Vital Sign    Days of Exercise per Week: 0 days    Minutes of Exercise per Session: 0 min  Stress: No Stress Concern Present (05/13/2022)   [place] of Stress : Not at all  Social Connections: Moderately Integrated (05/13/2022)   Social Connection and Isolation Panel [NHANES]    Frequency of Communication with Friends and Family: More than three times a week    Frequency of Social Gatherings with Friends and Family: More  than three times a week    Attends Religious Services: More than 4 times per year    Active Member of Clubs or Organizations: Yes    Attends Music therapist: More than 4 times per year    Marital Status: Separated   Additional Social History:                         Sleep: Fair  Appetite:  Fair  Current Medications: Current Facility-Administered Medications  Medication Dose Route Frequency Provider Last Rate Last Admin   acetaminophen (TYLENOL) tablet 650 mg  650 mg Oral Q6H PRN Sherlon Handing, NP       alum & mag hydroxide-simeth (MAALOX/MYLANTA) 200-200-20 MG/5ML suspension 30 mL  30 mL Oral Q4H PRN Waldon Merl F, NP       amLODipine (NORVASC) tablet 10 mg  10 mg Oral Daily Parks Ranger, DO   10 mg at 07/26/22 0944    divalproex (DEPAKOTE) DR tablet 500 mg  500 mg Oral BID Waldon Merl F, NP   500 mg at 07/26/22 0944   doxylamine (Sleep) (UNISOM) tablet 25 mg  25 mg Oral QHS PRN Sherlon Handing, NP   25 mg at 07/21/22 2144   feeding supplement (ENSURE ENLIVE / ENSURE PLUS) liquid 237 mL  237 mL Oral BID BM Gregg Winchell T, MD       ibuprofen (ADVIL) tablet 600 mg  600 mg Oral Q6H PRN Waldon Merl F, NP   600 mg at 07/21/22 0109   LORazepam (ATIVAN) tablet 1 mg  1 mg Oral Q4H PRN Parks Ranger, DO   1 mg at 07/22/22 2048   magnesium gluconate (MAGONATE) tablet 500 mg  500 mg Oral Daily Parks Ranger, DO   500 mg at 07/26/22 0944   magnesium hydroxide (MILK OF MAGNESIA) suspension 30 mL  30 mL Oral Daily PRN Sherlon Handing, NP       OLANZapine (ZYPREXA) tablet 10 mg  10 mg Oral QHS Parks Ranger, DO   10 mg at 07/25/22 2111   traZODone (DESYREL) tablet 50 mg  50 mg Oral QHS Sherlon Handing, NP   50 mg at 07/25/22 2111    Lab Results:  Results for orders placed or performed during the hospital encounter of 07/20/22 (from the past 48 hour(s))  Glucose, capillary     Status: Abnormal   Collection Time: 07/24/22  4:14 PM  Result Value Ref Range   Glucose-Capillary 108 (H) 70 - 99 mg/dL    Comment: Glucose reference range applies only to samples taken after fasting for at least 8 hours.  Glucose, capillary     Status: Abnormal   Collection Time: 07/24/22  7:35 PM  Result Value Ref Range   Glucose-Capillary 115 (H) 70 - 99 mg/dL    Comment: Glucose reference range applies only to samples taken after fasting for at least 8 hours.  Glucose, capillary     Status: None   Collection Time: 07/25/22  7:36 AM  Result Value Ref Range   Glucose-Capillary 91 70 - 99 mg/dL    Comment: Glucose reference range applies only to samples taken after fasting for at least 8 hours.  Glucose, capillary     Status: Abnormal   Collection Time: 07/25/22  4:26 PM  Result Value Ref Range    Glucose-Capillary 161 (H) 70 - 99 mg/dL    Comment: Glucose reference range applies only to  samples taken after fasting for at least 8 hours.  Glucose, capillary     Status: Abnormal   Collection Time: 07/25/22  7:28 PM  Result Value Ref Range   Glucose-Capillary 149 (H) 70 - 99 mg/dL    Comment: Glucose reference range applies only to samples taken after fasting for at least 8 hours.  Glucose, capillary     Status: Abnormal   Collection Time: 07/26/22  7:35 AM  Result Value Ref Range   Glucose-Capillary 100 (H) 70 - 99 mg/dL    Comment: Glucose reference range applies only to samples taken after fasting for at least 8 hours.    Blood Alcohol level:  Lab Results  Component Value Date   ETH <10 07/19/2022   ETH <10 57/84/6962    Metabolic Disorder Labs: Lab Results  Component Value Date   HGBA1C 6.3 11/28/2021   MPG 134.11 12/14/2019   No results found for: "PROLACTIN" Lab Results  Component Value Date   CHOL 233 (H) 11/28/2021   TRIG 133.0 11/28/2021   HDL 71.50 11/28/2021   CHOLHDL 3 11/28/2021   VLDL 26.6 11/28/2021   LDLCALC 135 (H) 11/28/2021    Physical Findings: AIMS:  , ,  ,  ,    CIWA:    COWS:     Musculoskeletal: Strength & Muscle Tone: within normal limits Gait & Station: normal Patient leans: N/A  Psychiatric Specialty Exam:  Presentation  General Appearance:  Appropriate for Environment; Casual  Eye Contact: Fair  Speech: Clear and Coherent  Speech Volume: Increased  Handedness: Right   Mood and Affect  Mood: Anxious; Labile  Affect: Inappropriate; Full Range   Thought Process  Thought Processes: Disorganized; Irrevelant  Descriptions of Associations:Loose  Orientation:Partial  Thought Content:Illogical; Scattered  History of Schizophrenia/Schizoaffective disorder:Yes  Duration of Psychotic Symptoms:Greater than six months  Hallucinations:No data recorded Ideas of Reference:Paranoia  Suicidal Thoughts:No data  recorded Homicidal Thoughts:No data recorded  Sensorium  Memory: Immediate Poor; Remote Poor  Judgment: Impaired  Insight: Lacking   Executive Functions  Concentration: Poor  Attention Span: Poor  Recall: Poor  Fund of Knowledge: Poor  Language: Poor   Psychomotor Activity  Psychomotor Activity:No data recorded  Assets  Assets: Desire for Improvement; Financial Resources/Insurance; Housing; Social Support   Sleep  Sleep:No data recorded   Physical Exam: Physical Exam Vitals reviewed.  Constitutional:      Appearance: Normal appearance.  HENT:     Head: Normocephalic and atraumatic.     Mouth/Throat:     Pharynx: Oropharynx is clear.  Eyes:     Pupils: Pupils are equal, round, and reactive to light.  Cardiovascular:     Rate and Rhythm: Normal rate and regular rhythm.  Pulmonary:     Effort: Pulmonary effort is normal.     Breath sounds: Normal breath sounds.  Abdominal:     General: Abdomen is flat.     Palpations: Abdomen is soft.  Musculoskeletal:        General: Normal range of motion.  Skin:    General: Skin is warm and dry.  Neurological:     General: No focal deficit present.     Mental Status: She is alert. Mental status is at baseline.  Psychiatric:        Mood and Affect: Mood normal.        Thought Content: Thought content normal.    Review of Systems  Constitutional: Negative.   HENT: Negative.    Eyes: Negative.  Respiratory: Negative.    Cardiovascular: Negative.   Gastrointestinal: Negative.   Musculoskeletal: Negative.   Skin: Negative.   Neurological: Negative.   Psychiatric/Behavioral: Negative.     Blood pressure (!) 145/79, pulse 77, temperature 97.9 F (36.6 C), temperature source Oral, resp. rate 18, SpO2 99 %. There is no height or weight on file to calculate BMI.   Treatment Plan Summary: Medication management and Plan no change to current medicine.  Appears to be stabilizing.  Supportive therapy  completed.  Nurses are satisfied with current level of care.  Alethia Berthold, MD 07/26/2022, 2:57 PM

## 2022-07-26 NOTE — Progress Notes (Signed)
Patient is alert and oriented times 2. Mood and affect appropriate. Patient rates pain as 0/10. She denies SI, HI, and AVH. Patient does endorse feelings of anxiety and depression at this time. Patient states she slept well last night. Evening medicines administered whole by mouth without difficulty. Patient ate snack in day room; appetite was fair. Patient remains on unit with Q15 minute checks in place.

## 2022-07-27 ENCOUNTER — Encounter: Payer: Medicare Other | Admitting: Obstetrics and Gynecology

## 2022-07-27 ENCOUNTER — Encounter: Payer: Self-pay | Admitting: Obstetrics and Gynecology

## 2022-07-27 DIAGNOSIS — F319 Bipolar disorder, unspecified: Secondary | ICD-10-CM | POA: Diagnosis not present

## 2022-07-27 LAB — GLUCOSE, CAPILLARY
Glucose-Capillary: 102 mg/dL — ABNORMAL HIGH (ref 70–99)
Glucose-Capillary: 153 mg/dL — ABNORMAL HIGH (ref 70–99)

## 2022-07-27 MED ORDER — METOPROLOL SUCCINATE ER 25 MG PO TB24: 25.0000 mg | ORAL_TABLET | Freq: Every day | ORAL | Status: DC

## 2022-07-27 NOTE — Telephone Encounter (Signed)
Unable to reach patient. Left voicemail to return call to our office.   

## 2022-07-27 NOTE — Group Note (Signed)
LCSW Group Therapy Note  Group Date: 07/27/2022 Start Time: 0300 End Time: 1630   Type of Therapy and Topic:  Group Therapy - Healthy vs Unhealthy Coping Skills  Participation Level:  Active   Description of Group The focus of this group was to determine what unhealthy coping techniques typically are used by group members and what healthy coping techniques would be helpful in coping with various problems. Patients were guided in becoming aware of the differences between healthy and unhealthy coping techniques. Patients were asked to identify 2-3 healthy coping skills they would like to learn to use more effectively.  Therapeutic Goals Patients learned that coping is what human beings do all day long to deal with various situations in their lives Patients defined and discussed healthy vs unhealthy coping techniques Patients identified their preferred coping techniques and identified whether these were healthy or unhealthy Patients determined 2-3 healthy coping skills they would like to become more familiar with and use more often. Patients provided support and ideas to each other   Summary of Patient Progress:  During group, she expressed that she felt "out of control" because her daughter thinks she is in control of her life for over "30 years". Patient proved open to input from peers and feedback from [place]. Patient demonstrated poor insight into the subject matter, was respectful of peers, and participated throughout the entire session.   Therapeutic Modalities Cognitive Behavioral Therapy Motivational Interviewing  Shelby Jordan A [place], Latanya Presser 07/27/2022  4:40 PM

## 2022-07-27 NOTE — Progress Notes (Signed)
D: Patient alert and oriented. Affect/mood reported as improving. Denies SI, HI, AVH, and pain. Patient goal, "I want to go home."  A: Scheduled medication administered to patient, per MD orders. Support and encouragement provided. Routine safety checks conducted every 15 minutes. Patient informed to notify staff with problems or concerns.   R: No adverse drug reactions noted. Patient contracts for safety at this time. Patient compliant with medications and treatment plan. Patient receptive, calm and cooperative. Patient interacts well with others on unit. Patient remains safe at this time.

## 2022-07-27 NOTE — Progress Notes (Signed)
Patient attended seated exercise group and actively participated in different exercises. 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   Of note, patient was falling asleep during group and had to be woken up throughout.

## 2022-07-27 NOTE — Progress Notes (Signed)
Patient is alert and oriented times 4. Mood and affect appropriate. Patient rates pain as 0/10. She denies SI, HI, and AVH. Patient also denies any feelings of anxiety and depression at this time. Patient states she slept well last night. Evening medicines administered whole by mouth without difficulty. Patient ate snack in day room, and asked for Ensure which was provided, even though it was not scheduled for the patient according to the [facility]; appetite was fair. Patient remains on unit with Q15 minute checks in place.

## 2022-07-27 NOTE — BH IP Treatment Plan (Signed)
Interdisciplinary Treatment and Diagnostic Plan Update  07/27/2022 Time of Session: 8:30AM Shelby Jordan MRN: [id number]  Principal Diagnosis: Bipolar 1 disorder ([name])  Secondary Diagnoses: Principal Problem:   Bipolar 1 disorder (Shelby Jordan)   Current Medications:  Current Facility-Administered Medications  Medication Dose Route Frequency Provider Last Rate Last Admin   acetaminophen (TYLENOL) tablet 650 mg  650 mg Oral Q6H PRN Sherlon Handing, NP       alum & mag hydroxide-simeth (MAALOX/MYLANTA) 200-200-20 MG/5ML suspension 30 mL  30 mL Oral Q4H PRN Sherlon Handing, NP       amLODipine (NORVASC) tablet 10 mg  10 mg Oral Daily Parks Ranger, DO   10 mg at 07/27/22 8185   divalproex (DEPAKOTE) DR tablet 500 mg  500 mg Oral BID Waldon Merl F, NP   500 mg at 07/27/22 6314   doxylamine (Sleep) (UNISOM) tablet 25 mg  25 mg Oral QHS PRN Sherlon Handing, NP   25 mg at 07/21/22 2144   feeding supplement (ENSURE ENLIVE / ENSURE PLUS) liquid 237 mL  237 mL Oral BID BM Clapacs, [name], MD   237 mL at 07/27/22 0851   ibuprofen (ADVIL) tablet 600 mg  600 mg Oral Q6H PRN Sherlon Handing, NP   600 mg at 07/21/22 0109   LORazepam (ATIVAN) tablet 1 mg  1 mg Oral Q4H PRN Parks Ranger, DO   1 mg at 07/22/22 2048   magnesium gluconate (MAGONATE) tablet 500 mg  500 mg Oral Daily Parks Ranger, DO   500 mg at 07/27/22 0851   magnesium hydroxide (MILK OF MAGNESIA) suspension 30 mL  30 mL Oral Daily PRN Sherlon Handing, NP       OLANZapine (ZYPREXA) tablet 10 mg  10 mg Oral QHS Parks Ranger, DO   10 mg at 07/26/22 2109   traZODone (DESYREL) tablet 50 mg  50 mg Oral QHS Sherlon Handing, NP   50 mg at 07/26/22 2109   PTA Medications: Medications Prior to Admission  Medication Sig Dispense Refill Last Dose   doxylamine, Sleep, (UNISOM) 25 MG tablet Take 25 mg by mouth at bedtime as needed for sleep or rhinitis.      magnesium gluconate (MAGONATE) 500 MG  tablet Take 500 mg by mouth at bedtime.      OLANZapine (ZYPREXA) 15 MG tablet Take 1 tablet (15 mg total) by mouth at bedtime. 30 tablet 0    traZODone (DESYREL) 50 MG tablet Take 1 tablet (50 mg total) by mouth at bedtime. 30 tablet 0     Patient Stressors:    Patient Strengths:    Treatment Modalities: Medication Management, Group therapy, Case management,  1 to 1 session with clinician, Psychoeducation, Recreational therapy.   Physician Treatment Plan for Primary Diagnosis: Bipolar 1 disorder ([place]) Long Term Goal(s): Improvement in symptoms so as ready for discharge   Short Term Goals: Ability to identify changes in lifestyle to reduce recurrence of condition will improve Ability to verbalize feelings will improve Ability to disclose and discuss suicidal ideas Ability to demonstrate self-control will improve Ability to identify and develop effective coping behaviors will improve Ability to maintain clinical measurements within normal limits will improve Compliance with prescribed medications will improve Ability to identify triggers associated with substance abuse/mental health issues will improve  Medication Management: Evaluate patient's response, side effects, and tolerance of medication regimen.  Therapeutic Interventions: 1 to 1 sessions, Unit Group sessions and Medication administration.  Evaluation of  Outcomes: Progressing  Physician Treatment Plan for Secondary Diagnosis: Principal Problem:   Bipolar 1 disorder ([place])  Long Term Goal(s): Improvement in symptoms so as ready for discharge   Short Term Goals: Ability to identify changes in lifestyle to reduce recurrence of condition will improve Ability to verbalize feelings will improve Ability to disclose and discuss suicidal ideas Ability to demonstrate self-control will improve Ability to identify and develop effective coping behaviors will improve Ability to maintain clinical measurements within normal limits  will improve Compliance with prescribed medications will improve Ability to identify triggers associated with substance abuse/mental health issues will improve     Medication Management: Evaluate patient's response, side effects, and tolerance of medication regimen.  Therapeutic Interventions: 1 to 1 sessions, Unit Group sessions and Medication administration.  Evaluation of Outcomes: Progressing   RN Treatment Plan for Primary Diagnosis: Bipolar 1 disorder ([place]) Long Term Goal(s): Knowledge of disease and therapeutic regimen to maintain health will improve  Short Term Goals: Ability to remain free from injury will improve, Ability to verbalize frustration and anger appropriately will improve, Ability to demonstrate self-control, Ability to participate in decision making will improve, Ability to verbalize feelings will improve, Ability to identify and develop effective coping behaviors will improve, and Compliance with prescribed medications will improve  Medication Management: RN will administer medications as ordered by provider, will assess and evaluate patient's response and provide education to patient for prescribed medication. RN will report any adverse and/or side effects to prescribing provider.  Therapeutic Interventions: 1 on 1 counseling sessions, Psychoeducation, Medication administration, Evaluate responses to treatment, Monitor vital signs and CBGs as ordered, Perform/monitor CIWA, COWS, AIMS and Fall Risk screenings as ordered, Perform wound care treatments as ordered.  Evaluation of Outcomes: Progressing   LCSW Treatment Plan for Primary Diagnosis: Bipolar 1 disorder ([name]) Long Term Goal(s): Safe transition to appropriate next level of care at discharge, Engage patient in therapeutic group addressing interpersonal concerns.  Short Term Goals: Engage patient in aftercare planning with referrals and resources, Increase social support, Increase ability to appropriately  verbalize feelings, Increase emotional regulation, Facilitate acceptance of mental health diagnosis and concerns, and Increase skills for wellness and recovery  Therapeutic Interventions: Assess for all discharge needs, 1 to 1 time with Social worker, Explore available resources and support systems, Assess for adequacy in community support network, Educate family and significant other(s) on suicide prevention, Complete Psychosocial Assessment, Interpersonal group therapy.  Evaluation of Outcomes: Progressing   Progress in Treatment: Attending groups: Yes. Participating in groups: Yes. Taking medication as prescribed: Yes. Toleration medication: Yes. Family/Significant other contact made: Yes, individual(s) contacted:  SPE completed with pt's daughter, Arnoldo Morale Patient understands diagnosis: No. Discussing patient identified problems/goals with staff: Yes. Medical problems stabilized or resolved: Yes. Denies suicidal/homicidal ideation: Yes. Issues/concerns per patient self-inventory: No. Other: None  New problem(s) identified: No, Describe:  None  New Short Term/Long Term Goal(s): Patient to work towards  elimination of symptoms of psychosis, medication management for mood stabilization; development of comprehensive mental wellness plan. Update 07/27/22: No changes at this time.    Patient Goals:  "need a psychiatrist" Update 07/27/22: No changes at this time.    Discharge Plan or Barriers: CSW will assist pt with development of appropriate discharge/aftercare plan. Update 07/27/22: No changes at this time.    Reason for Continuation of Hospitalization: Delusions  Mania Medication stabilization   Estimated Length of Stay: 1-7 days  Last 3 [place] Suicide Severity Risk Score: [place] Admission (Current) from  07/20/2022 in [place] ED from 07/19/2022 in [place] ED from 05/22/2022 in [place] No Risk No Risk No Risk       Last PHQ 2/9 Scores:    06/25/2022   12:35 PM 05/13/2022    2:10 PM  Depression screen PHQ 2/9  Decreased Interest 0 0  Down, Depressed, Hopeless 0 0  PHQ - 2 Score 0 0    Scribe for Treatment Team: Miriya Cloer A [place], [place] 07/27/2022 9:02 AM

## 2022-07-27 NOTE — Progress Notes (Signed)
[facility] The MD Progress Note  07/27/2022 12:53 PM Abigayl Hor  MRN:  [id number] Subjective: Shelby Jordan is doing better.  She is sleeping well and her appetite is good.  She is pleasant cooperative and compliant with her medication.  No side effects.  She has not had any falling behavior.  Discussed with social work discharge sometime this week.  Her blood pressure remains high along with her heart rate so I will start a beta blocker.  Principal Problem: Bipolar 1 disorder ([place]) Diagnosis: Principal Problem:   Bipolar 1 disorder ([place])  Total Time spent with patient: 15 minutes  Past Psychiatric History: More than 10 inpatient hospitalizations. She currently does not have a psychiatrist. Long history of bipolar disorder.    Past Medical History:  Past Medical History:  Diagnosis Date   Bipolar 1 disorder ([place])    Depression    Hepatitis C    High cholesterol    Hypertension    UTI (urinary tract infection)     Past Surgical History:  Procedure Laterality Date   APPENDECTOMY     BREAST BIOPSY Right 03/04/2022   stereo bx, mass "X" clip-path pending   TOE AMPUTATION     Family History:  Family History  Problem Relation Age of Onset   Mental illness Mother        born in mental hospital   Breast cancer Neg Hx    Family Psychiatric  History: Unremarkable Social History:  Social History   Substance and Sexual Activity  Alcohol Use Never     Social History   Substance and Sexual Activity  Drug Use Never    Social History   Socioeconomic History   Marital status: Legally Separated    Spouse name: Not on file   Number of children: Not on file   Years of education: Not on file   Highest education level: Not on file  Occupational History   Not on file  Tobacco Use   Smoking status: Former    Packs/day: 1.00    Years: 30.00    Total pack years: 30.00    Types: Cigarettes    Quit date: 2023    Years since quitting: 1.0   Smokeless tobacco: Never  Vaping Use   Vaping Use:  Never used  Substance and Sexual Activity   Alcohol use: Never   Drug use: Never   Sexual activity: Not Currently    Partners: Male  Other Topics Concern   Not on file  Social History Narrative   Not on file   Social Determinants of Health   Financial Resource Strain: Low Risk  (05/13/2022)   Overall Financial Resource Strain (CARDIA)    Difficulty of Paying Living Expenses: Not hard at all  Food Insecurity: No Food Insecurity (07/20/2022)   Hunger Vital Sign    Worried About Running Out of Food in the Last Year: Never true    [place] in the Last Year: Never true  Transportation Needs: Unmet Transportation Needs (07/20/2022)   PRAPARE - Transportation    Lack of Transportation (Medical): Yes    Lack of Transportation (Non-Medical): Yes  Physical Activity: Inactive (05/13/2022)   Exercise Vital Sign    Days of Exercise per Week: 0 days    Minutes of Exercise per Session: 0 min  Stress: No Stress Concern Present (05/13/2022)   [place]    Feeling of Stress : Not at all  Social Connections: Moderately Integrated (05/13/2022)  Social Licensed conveyancer [NHANES]    Frequency of Communication with Friends and Family: More than three times a week    Frequency of Social Gatherings with Friends and Family: More than three times a week    Attends Religious Services: More than 4 times per year    Active Member of [employer] or Organizations: Yes    Attends Music therapist: More than 4 times per year    Marital Status: Separated   Additional Social History:                         Sleep: Good  Appetite:  Good  Current Medications: Current Facility-Administered Medications  Medication Dose Route Frequency Provider Last Rate Last Admin   acetaminophen (TYLENOL) tablet 650 mg  650 mg Oral Q6H PRN Sherlon Handing, NP       alum & mag hydroxide-simeth (MAALOX/MYLANTA)  200-200-20 MG/5ML suspension 30 mL  30 mL Oral Q4H PRN Waldon Merl F, NP       amLODipine (NORVASC) tablet 10 mg  10 mg Oral Daily Parks Ranger, DO   10 mg at 07/27/22 9562   divalproex (DEPAKOTE) DR tablet 500 mg  500 mg Oral BID Waldon Merl F, NP   500 mg at 07/27/22 1308   doxylamine (Sleep) (UNISOM) tablet 25 mg  25 mg Oral QHS PRN Waldon Merl F, NP   25 mg at 07/21/22 2144   feeding supplement (ENSURE ENLIVE / ENSURE PLUS) liquid 237 mL  237 mL Oral BID BM Clapacs, [name], MD   237 mL at 07/27/22 0851   ibuprofen (ADVIL) tablet 600 mg  600 mg Oral Q6H PRN Waldon Merl F, NP   600 mg at 07/21/22 0109   LORazepam (ATIVAN) tablet 1 mg  1 mg Oral Q4H PRN Parks Ranger, DO   1 mg at 07/22/22 2048   magnesium gluconate (MAGONATE) tablet 500 mg  500 mg Oral Daily Parks Ranger, DO   500 mg at 07/27/22 6578   magnesium hydroxide (MILK OF MAGNESIA) suspension 30 mL  30 mL Oral Daily PRN Sherlon Handing, NP       OLANZapine (ZYPREXA) tablet 10 mg  10 mg Oral QHS Parks Ranger, DO   10 mg at 07/26/22 2109   traZODone (DESYREL) tablet 50 mg  50 mg Oral QHS Sherlon Handing, NP   50 mg at 07/26/22 2109    Lab Results:  Results for orders placed or performed during the hospital encounter of 07/20/22 (from the past 48 hour(s))  Glucose, capillary     Status: Abnormal   Collection Time: 07/25/22  4:26 PM  Result Value Ref Range   Glucose-Capillary 161 (H) 70 - 99 mg/dL    Comment: Glucose reference range applies only to samples taken after fasting for at least 8 hours.  Glucose, capillary     Status: Abnormal   Collection Time: 07/25/22  7:28 PM  Result Value Ref Range   Glucose-Capillary 149 (H) 70 - 99 mg/dL    Comment: Glucose reference range applies only to samples taken after fasting for at least 8 hours.  Glucose, capillary     Status: Abnormal   Collection Time: 07/26/22  7:35 AM  Result Value Ref Range   Glucose-Capillary 100  (H) 70 - 99 mg/dL    Comment: Glucose reference range applies only to samples taken after fasting for at least 8 hours.  Glucose, capillary  Status: Abnormal   Collection Time: 07/26/22  5:46 PM  Result Value Ref Range   Glucose-Capillary 154 (H) 70 - 99 mg/dL    Comment: Glucose reference range applies only to samples taken after fasting for at least 8 hours.  Glucose, capillary     Status: Abnormal   Collection Time: 07/27/22  7:39 AM  Result Value Ref Range   Glucose-Capillary 102 (H) 70 - 99 mg/dL    Comment: Glucose reference range applies only to samples taken after fasting for at least 8 hours.    Blood Alcohol level:  Lab Results  Component Value Date   ETH <10 07/19/2022   ETH <10 60/63/0160    Metabolic Disorder Labs: Lab Results  Component Value Date   HGBA1C 6.3 11/28/2021   MPG 134.11 12/14/2019   No results found for: "PROLACTIN" Lab Results  Component Value Date   CHOL 233 (H) 11/28/2021   TRIG 133.0 11/28/2021   HDL 71.50 11/28/2021   CHOLHDL 3 11/28/2021   VLDL 26.6 11/28/2021   LDLCALC 135 (H) 11/28/2021    Physical Findings: AIMS:  , ,  ,  ,    CIWA:    COWS:     Musculoskeletal: Strength & Muscle Tone: within normal limits Gait & Station: normal Patient leans: N/A  Psychiatric Specialty Exam:  Presentation  General Appearance:  Appropriate for Environment; Casual  Eye Contact: Fair  Speech: Clear and Coherent  Speech Volume: Increased  Handedness: Right   Mood and Affect  Mood: Anxious; Labile  Affect: Inappropriate; Full Range   Thought Process  Thought Processes: Disorganized; Irrevelant  Descriptions of Associations:Loose  Orientation:Partial  Thought Content:Illogical; Scattered  History of Schizophrenia/Schizoaffective disorder:Yes  Duration of Psychotic Symptoms:Greater than six months  Hallucinations:No data recorded Ideas of Reference:Paranoia  Suicidal Thoughts:No data recorded Homicidal  Thoughts:No data recorded  Sensorium  Memory: Immediate Poor; Remote Poor  Judgment: Impaired  Insight: Lacking   Executive Functions  Concentration: Poor  Attention Span: Poor  Recall: Poor  Fund of Knowledge: Poor  Language: Poor   Psychomotor Activity  Psychomotor Activity:No data recorded  Assets  Assets: Desire for Improvement; Financial Resources/Insurance; Housing; Social Support   Sleep  Sleep:No data recorded   Physical Exam: Physical Exam Vitals and nursing note reviewed.  Constitutional:      Appearance: Normal appearance. She is normal weight.  Neurological:     General: No focal deficit present.     Mental Status: She is alert and oriented to person, place, and time.  Psychiatric:        Attention and Perception: Attention and perception normal.        Mood and Affect: Mood and affect normal.        Speech: Speech normal.        Behavior: Behavior normal. Behavior is cooperative.        Thought Content: Thought content normal.        Cognition and Memory: Cognition and memory normal.        Judgment: Judgment normal.    Review of Systems  Constitutional: Negative.   HENT: Negative.    Eyes: Negative.   Respiratory: Negative.    Cardiovascular: Negative.   Gastrointestinal: Negative.   Genitourinary: Negative.   Musculoskeletal: Negative.   Skin: Negative.   Neurological: Negative.   Endo/Heme/Allergies: Negative.   Psychiatric/Behavioral: Negative.     Blood pressure (!) 147/91, pulse 90, temperature 97.6 F (36.4 C), temperature source Oral, resp. rate 20, SpO2 100 %.  There is no height or weight on file to calculate BMI.   Treatment Plan Summary: Daily contact with patient to assess and evaluate symptoms and progress in treatment, Medication management, and Plan start Toprol-XL 25 mg in the morning.  Parks Ranger, DO 07/27/2022, 12:53 PM

## 2022-07-27 NOTE — Telephone Encounter (Signed)
Left voicemail with patients daughter for patient or daughter to return call to office.

## 2022-07-28 DIAGNOSIS — F319 Bipolar disorder, unspecified: Secondary | ICD-10-CM | POA: Diagnosis not present

## 2022-07-28 LAB — GLUCOSE, CAPILLARY
Glucose-Capillary: 106 mg/dL — ABNORMAL HIGH (ref 70–99)
Glucose-Capillary: 189 mg/dL — ABNORMAL HIGH (ref 70–99)

## 2022-07-28 MED ORDER — FUROSEMIDE 20 MG PO TABS
20.0000 mg | ORAL_TABLET | Freq: Every day | ORAL | Status: DC
  Administered 2022-07-28 – 2022-07-30 (×3): 20 mg via ORAL
  Filled 2022-07-28 (×3): qty 1

## 2022-07-28 MED ORDER — METOPROLOL SUCCINATE ER 25 MG PO TB24
50.0000 mg | ORAL_TABLET | Freq: Every day | ORAL | Status: DC
  Administered 2022-07-28 – 2022-07-30 (×3): 50 mg via ORAL
  Filled 2022-07-28 (×3): qty 2

## 2022-07-28 NOTE — [facility] Group Notes (Signed)
[place] Group Notes:  (Nursing/MHT/Case Management/Adjunct)  Date:  07/28/2022  Time:  6:02 PM  Type of Therapy:  Music Therapy  Participation Level:  Active  Participation Quality:  Appropriate  Affect:  Appropriate  Cognitive:  Alert  Insight:  Appropriate  Engagement in Group:  Engaged  Modes of Intervention:  Activity  Summary of Progress/Problems:  Tallyn Holroyd l Madge Therrien 07/28/2022, 6:02 PM

## 2022-07-28 NOTE — Progress Notes (Signed)
D: Patient alert and oriented. Affect/mood reported as improving. Denies SI, HI, AVH, and pain. Patient goal, "I want to go home."   A: Scheduled medication administered to patient, per MD orders. Support and encouragement provided. Routine safety checks conducted every 15 minutes.    R: No adverse drug reactions noted. Patient contracts for safety at this time. Patient compliant with medications and treatment plan. Patient receptive, calm and cooperative. Patient interacts well with others on unit. Patient remains safe at this time.

## 2022-07-28 NOTE — Plan of Care (Signed)

## 2022-07-28 NOTE — Progress Notes (Signed)
   07/28/22 2200  Psych Admission Type (Psych Patients Only)  Admission Status Voluntary  Psychosocial Assessment  Patient Complaints None  Eye Contact Fair  Facial Expression Animated  Affect Preoccupied  Speech Logical/coherent  Interaction Assertive  Motor Activity Shuffling  Appearance/Hygiene In scrubs  Behavior Characteristics Cooperative  Mood Pleasant  Thought Process  Coherency Circumstantial  Content Preoccupation  Delusions None reported or observed  Perception WDL  Hallucination None reported or observed  Judgment Impaired  Confusion Mild  Danger to Self  Current suicidal ideation? Denies  Danger to Others  Danger to Others None reported or observed  Danger to Others Abnormal  Harmful Behavior to others No threats or harm toward other people  Destructive Behavior No threats or harm toward property

## 2022-07-28 NOTE — Progress Notes (Signed)
Patient did not keep her GYN referral appointment for 07/27/2022.  Durene Romans MD Attending Center for [employer] farm manager)

## 2022-07-28 NOTE — Progress Notes (Signed)
Patient alert and oriented x 4. Patient denies SI, HI, AVH, and pain. Patient observed interacting well with others on the unit. Patient compliant with medications and treatment plan. No adverse drug reactions noted. Support and encouragement provided. Routine safety checks conducted every 15 minutes for safety and will continue to update as needed.

## 2022-07-28 NOTE — Group Note (Signed)
[facility] LCSW Group Therapy Note   Group Date: 07/28/2022 Start Time: 1330 End Time: 1355   Type of Therapy/Topic:  Group Therapy:  Emotion Regulation  Participation Level:  Active   Mood:  Description of Group:    The purpose of this group is to assist patients in learning to regulate negative emotions and experience positive emotions. Patients will be guided to discuss ways in which they have been vulnerable to their negative emotions. These vulnerabilities will be juxtaposed with experiences of positive emotions or situations, and patients challenged to use positive emotions to combat negative ones. Special emphasis will be placed on coping with negative emotions in conflict situations, and patients will process healthy conflict resolution skills.  Therapeutic Goals: Patient will identify two positive emotions or experiences to reflect on in order to balance out negative emotions:  Patient will label two or more emotions that they find the most difficult to experience:  Patient will be able to demonstrate positive conflict resolution skills through discussion or role plays:   Summary of Patient Progress:   Patient was present for the entirety of the group session. Patient was an active listener and participated in the topic of discussion, provided helpful advice to others, and added nuance to topic of conversation. She stated that she enjoyed "drinking coffee and relaxing" as her favorite rainy day activity. She said that emotional regulation is "staying strong" through difficult experiences. Group ended early due to unit disruption from [place] and pt's needing to be moved to a safer area.      Therapeutic Modalities:   Cognitive Behavioral Therapy Feelings Identification Dialectical Behavioral Therapy   Elenor Wildes A [place], LCSWA

## 2022-07-28 NOTE — Progress Notes (Signed)
   07/28/22 0300  Psych Admission Type (Psych Patients Only)  Admission Status Voluntary  Psychosocial Assessment  Patient Complaints None  Eye Contact Fair  Facial Expression Animated  Affect Preoccupied  Speech Logical/coherent  Interaction Assertive  Motor Activity Shuffling;Fidgety  Appearance/Hygiene In scrubs  Behavior Characteristics Cooperative;Appropriate to situation;Calm  Mood Pleasant  Thought Process  Coherency Circumstantial  Content Preoccupation  Delusions None reported or observed  Perception WDL  Hallucination None reported or observed  Judgment Impaired  Confusion Mild  Danger to Self  Current suicidal ideation? Denies  Agreement Not to Harm Self Yes  Description of Agreement verbal  Danger to Others  Danger to Others None reported or observed  Danger to Others Abnormal  Harmful Behavior to others No threats or harm toward other people  Destructive Behavior No threats or harm toward property

## 2022-07-28 NOTE — Progress Notes (Signed)
   07/28/22 0734  Psych Admission Type (Psych Patients Only)  Admission Status Voluntary  Psychosocial Assessment  Patient Complaints None  Eye Contact Fair  Facial Expression Animated  Affect Preoccupied  Speech Logical/coherent  Interaction Assertive  Motor Activity Shuffling  Appearance/Hygiene In scrubs  Behavior Characteristics Cooperative  Mood Pleasant  Thought Process  Coherency Circumstantial  Content Preoccupation  Delusions None reported or observed  Perception WDL  Hallucination None reported or observed  Judgment Impaired  Confusion Mild  Danger to Self  Current suicidal ideation? Denies  Danger to Others  Danger to Others None reported or observed

## 2022-07-28 NOTE — Progress Notes (Signed)
[facility] MD Progress Note  07/28/2022 9:50 AM Shelby Jordan  MRN:  [id number] Subjective: Shelby Jordan is seen on rounds.  She is in a good mood.  She has been compliant with medications.  No problems and no issues.  Social work is working on her follow-up appointments.  She has not seen a psychiatrist since moving here from [place] 4 years ago.  She does have a long psychiatric history and if we do not get her outpatient I am afraid she would be back pretty quickly.  She has had blood pressure problems and continues to have them even though I have titrated her Norvasc up to 10 mg/day.  I also added Toprol-XL.  I am going to discontinue her Norvasc and increase her Toprol and add Lasix. Principal Problem: Bipolar 1 disorder ([place]) Diagnosis: Principal Problem:   Bipolar 1 disorder ([place])  Total Time spent with patient: 15 minutes  Past Psychiatric History:  More than 10 inpatient hospitalizations. She currently does not have a psychiatrist. Long history of bipolar disorder.     Past Medical History:  Past Medical History:  Diagnosis Date   Bipolar 1 disorder ([place])    Depression    Hepatitis C    High cholesterol    Hypertension    UTI (urinary tract infection)     Past Surgical History:  Procedure Laterality Date   APPENDECTOMY     BREAST BIOPSY Right 03/04/2022   stereo bx, mass "X" clip-path pending   TOE AMPUTATION     Family History:  Family History  Problem Relation Age of Onset   Mental illness Mother        born in mental hospital   Breast cancer Neg Hx    Family Psychiatric  History: Unremarkable Social History:  Social History   Substance and Sexual Activity  Alcohol Use Never     Social History   Substance and Sexual Activity  Drug Use Never    Social History   Socioeconomic History   Marital status: Legally Separated    Spouse name: Not on file   Number of children: Not on file   Years of education: Not on file   Highest education level: Not on file  Occupational  History   Not on file  Tobacco Use   Smoking status: Former    Packs/day: 1.00    Years: 30.00    Total pack years: 30.00    Types: Cigarettes    Quit date: 2023    Years since quitting: 1.0   Smokeless tobacco: Never  Vaping Use   Vaping Use: Never used  Substance and Sexual Activity   Alcohol use: Never   Drug use: Never   Sexual activity: Not Currently    Partners: Male  Other Topics Concern   Not on file  Social History Narrative   Not on file   Social Determinants of Health   Financial Resource Strain: Low Risk  (05/13/2022)   Overall Financial Resource Strain (CARDIA)    Difficulty of Paying Living Expenses: Not hard at all  Food Insecurity: No Food Insecurity (07/20/2022)   Hunger Vital Sign    Worried About Running Out of Food in the Last Year: Never true    Ran Out of Food in the Last Year: Never true  Transportation Needs: Unmet Transportation Needs (07/20/2022)   PRAPARE - Transportation    Lack of Transportation (Medical): Yes    Lack of Transportation (Non-Medical): Yes  Physical Activity: Inactive (05/13/2022)   Exercise Vital  Sign    Days of Exercise per Week: 0 days    Minutes of Exercise per Session: 0 min  Stress: No Stress Concern Present (05/13/2022)   [place]    Feeling of Stress : Not at all  Social Connections: Moderately Integrated (05/13/2022)   Social Connection and Isolation Panel [NHANES]    Frequency of Communication with Friends and Family: More than three times a week    Frequency of Social Gatherings with Friends and Family: More than three times a week    Attends Religious Services: More than 4 times per year    Active Member of [employer] or Organizations: Yes    Attends Music therapist: More than 4 times per year    Marital Status: Separated   Additional Social History:                         Sleep: Good  Appetite:  Good  Current  Medications: Current Facility-Administered Medications  Medication Dose Route Frequency Provider Last Rate Last Admin   acetaminophen (TYLENOL) tablet 650 mg  650 mg Oral Q6H PRN Sherlon Handing, NP       alum & mag hydroxide-simeth (MAALOX/MYLANTA) 200-200-20 MG/5ML suspension 30 mL  30 mL Oral Q4H PRN Waldon Merl F, NP       amLODipine (NORVASC) tablet 10 mg  10 mg Oral Daily Parks Ranger, DO   10 mg at 07/27/22 9381   divalproex (DEPAKOTE) DR tablet 500 mg  500 mg Oral BID Waldon Merl F, NP   500 mg at 07/27/22 2054   doxylamine (Sleep) (UNISOM) tablet 25 mg  25 mg Oral QHS PRN Waldon Merl F, NP   25 mg at 07/21/22 2144   feeding supplement (ENSURE ENLIVE / ENSURE PLUS) liquid 237 mL  237 mL Oral BID BM Clapacs, [name], MD   237 mL at 07/27/22 1433   ibuprofen (ADVIL) tablet 600 mg  600 mg Oral Q6H PRN Waldon Merl F, NP   600 mg at 07/27/22 1325   LORazepam (ATIVAN) tablet 1 mg  1 mg Oral Q4H PRN Parks Ranger, DO   1 mg at 07/22/22 2048   magnesium gluconate (MAGONATE) tablet 500 mg  500 mg Oral Daily Parks Ranger, DO   500 mg at 07/27/22 8299   magnesium hydroxide (MILK OF MAGNESIA) suspension 30 mL  30 mL Oral Daily PRN Sherlon Handing, NP       metoprolol succinate (TOPROL-XL) 24 hr tablet 25 mg  25 mg Oral Daily Parks Ranger, DO       OLANZapine (ZYPREXA) tablet 10 mg  10 mg Oral QHS Parks Ranger, DO   10 mg at 07/27/22 2054   traZODone (DESYREL) tablet 50 mg  50 mg Oral QHS Sherlon Handing, NP   50 mg at 07/27/22 2054    Lab Results:  Results for orders placed or performed during the hospital encounter of 07/20/22 (from the past 48 hour(s))  Glucose, capillary     Status: Abnormal   Collection Time: 07/26/22  5:46 PM  Result Value Ref Range   Glucose-Capillary 154 (H) 70 - 99 mg/dL    Comment: Glucose reference range applies only to samples taken after fasting for at least 8 hours.  Glucose, capillary      Status: Abnormal   Collection Time: 07/27/22  7:39 AM  Result Value Ref Range  Glucose-Capillary 102 (H) 70 - 99 mg/dL    Comment: Glucose reference range applies only to samples taken after fasting for at least 8 hours.  Glucose, capillary     Status: Abnormal   Collection Time: 07/27/22  4:35 PM  Result Value Ref Range   Glucose-Capillary 153 (H) 70 - 99 mg/dL    Comment: Glucose reference range applies only to samples taken after fasting for at least 8 hours.  Glucose, capillary     Status: Abnormal   Collection Time: 07/28/22  7:43 AM  Result Value Ref Range   Glucose-Capillary 106 (H) 70 - 99 mg/dL    Comment: Glucose reference range applies only to samples taken after fasting for at least 8 hours.    Blood Alcohol level:  Lab Results  Component Value Date   ETH <10 07/19/2022   ETH <10 60/73/7106    Metabolic Disorder Labs: Lab Results  Component Value Date   HGBA1C 6.3 11/28/2021   MPG 134.11 12/14/2019   No results found for: "PROLACTIN" Lab Results  Component Value Date   CHOL 233 (H) 11/28/2021   TRIG 133.0 11/28/2021   HDL 71.50 11/28/2021   CHOLHDL 3 11/28/2021   VLDL 26.6 11/28/2021   LDLCALC 135 (H) 11/28/2021    Physical Findings: AIMS:  , ,  ,  ,    CIWA:    COWS:     Musculoskeletal: Strength & Muscle Tone: within normal limits Gait & Station: normal Patient leans: N/A  Psychiatric Specialty Exam:  Presentation  General Appearance:  Appropriate for Environment; Casual  Eye Contact: Fair  Speech: Clear and Coherent  Speech Volume: Increased  Handedness: Right   Mood and Affect  Mood: Anxious; Labile  Affect: Inappropriate; Full Range   Thought Process  Thought Processes: Disorganized; Irrevelant  Descriptions of Associations:Loose  Orientation:Partial  Thought Content:Illogical; Scattered  History of Schizophrenia/Schizoaffective disorder:Yes  Duration of Psychotic Symptoms:Greater than six  months  Hallucinations:No data recorded Ideas of Reference:Paranoia  Suicidal Thoughts:No data recorded Homicidal Thoughts:No data recorded  Sensorium  Memory: Immediate Poor; Remote Poor  Judgment: Impaired  Insight: Lacking   Executive Functions  Concentration: Poor  Attention Span: Poor  Recall: Poor  Fund of Knowledge: Poor  Language: Poor   Psychomotor Activity  Psychomotor Activity:No data recorded  Assets  Assets: Desire for Improvement; Financial Resources/Insurance; Housing; Social Support   Sleep  Sleep:No data recorded   Physical Exam: Physical Exam Vitals and nursing note reviewed.  Constitutional:      Appearance: Normal appearance. She is normal weight.  Neurological:     General: No focal deficit present.     Mental Status: She is alert and oriented to person, place, and time.  Psychiatric:        Attention and Perception: Attention and perception normal.        Mood and Affect: Mood and affect normal.        Speech: Speech normal.        Behavior: Behavior normal. Behavior is cooperative.        Thought Content: Thought content normal.        Cognition and Memory: Cognition and memory normal.        Judgment: Judgment normal.    Review of Systems  Constitutional: Negative.   HENT: Negative.    Eyes: Negative.   Respiratory: Negative.    Cardiovascular: Negative.   Gastrointestinal: Negative.   Genitourinary: Negative.   Musculoskeletal: Negative.   Skin: Negative.   Neurological:  Negative.   Endo/Heme/Allergies: Negative.   Psychiatric/Behavioral: Negative.     Blood pressure (!) 150/77, pulse 86, temperature 98 F (36.7 C), temperature source Oral, resp. rate 16, SpO2 100 %. There is no height or weight on file to calculate BMI.   Treatment Plan Summary: Daily contact with patient to assess and evaluate symptoms and progress in treatment, Medication management, and Plan continue psychiatric medicines.  Discontinue  Norvasc and increase Toprol and add Lasix  Parks Ranger, DO 07/28/2022, 9:50 AM

## 2022-07-29 DIAGNOSIS — F319 Bipolar disorder, unspecified: Secondary | ICD-10-CM | POA: Diagnosis not present

## 2022-07-29 LAB — GLUCOSE, CAPILLARY
Glucose-Capillary: 137 mg/dL — ABNORMAL HIGH (ref 70–99)
Glucose-Capillary: 169 mg/dL — ABNORMAL HIGH (ref 70–99)

## 2022-07-29 MED ORDER — POTASSIUM CHLORIDE CRYS ER 10 MEQ PO TBCR
10.0000 meq | EXTENDED_RELEASE_TABLET | Freq: Every day | ORAL | Status: DC
  Administered 2022-07-29 – 2022-07-30 (×2): 10 meq via ORAL
  Filled 2022-07-29 (×2): qty 1

## 2022-07-29 MED ORDER — ALBUTEROL SULFATE HFA 108 (90 BASE) MCG/ACT IN AERS
2.0000 | INHALATION_SPRAY | Freq: Four times a day (QID) | RESPIRATORY_TRACT | Status: DC | PRN
  Administered 2022-07-29 – 2022-07-30 (×2): 2 via RESPIRATORY_TRACT
  Filled 2022-07-29: qty 6.7

## 2022-07-29 NOTE — Progress Notes (Signed)
Staff was helping patient to the bathroom when patient experienced short of breath and was wheezing, patient told nurse that she has history of COPD. NP was notified and said she will place an order for an inhaler.

## 2022-07-29 NOTE — Group Note (Signed)
[facility] LCSW Group Therapy Note   Group Date: 07/29/2022 Start Time: 1320 End Time: 1400   Type of Therapy/Topic:  Group Therapy:  Balance in Life  Participation Level:  Did Not Attend   Description of Group:    This group will address the concept of balance and how it feels and looks when one is unbalanced. Patients will be encouraged to process areas in their lives that are out of balance, and identify reasons for remaining unbalanced. Facilitators will guide patients utilizing problem- solving interventions to address and correct the stressor making their life unbalanced. Understanding and applying boundaries will be explored and addressed for obtaining  and maintaining a balanced life. Patients will be encouraged to explore ways to assertively make their unbalanced needs known to significant others in their lives, using other group members and facilitator for support and feedback.  Therapeutic Goals: Patient will identify two or more emotions or situations they have that consume much of in their lives. Patient will identify signs/triggers that life has become out of balance:  Patient will identify two ways to set boundaries in order to achieve balance in their lives:  Patient will demonstrate ability to communicate their needs through discussion and/or role plays  Summary of Patient Progress: X    Therapeutic Modalities:   Cognitive Behavioral Therapy Solution-Focused Therapy Assertiveness Training   Bremond Martin[place], LCSWA

## 2022-07-29 NOTE — BHH Group Notes (Signed)
Activity group of light exercises completed. Pt attended and was appropriate.

## 2022-07-29 NOTE — Progress Notes (Signed)
D: Patient alert and oriented. Affect/mood reported as improving. Denies SI, HI, AVH, and pain. Patient goal, "I want to go home."   A: Scheduled medication administered to patient, per MD orders. Support and encouragement provided. Routine safety checks conducted every 15 minutes.    R: No adverse drug reactions noted. Patient contracts for safety at this time. Patient compliant with medications and treatment plan. Patient receptive, calm and cooperative. Patient interacts well with others on unit. Patient remains safe at this time.

## 2022-07-29 NOTE — Progress Notes (Signed)
[facility] MD Progress Note  07/29/2022 11:23 AM Shelby Jordan  MRN:  [id number] Subjective: Patient is seen on rounds.  She has been compliant with medications.  She interacts well with staff and peers.  She slept well and her appetite is good.  We discussed discharge tomorrow. Principal Problem: Bipolar 1 disorder ([place]) Diagnosis: Principal Problem:   Bipolar 1 disorder ([place])  Total Time spent with patient: 15 minutes  Past Psychiatric History:  More than 10 inpatient hospitalizations. She currently does not have a psychiatrist. Long history of bipolar disorder.   Past Medical History:  Past Medical History:  Diagnosis Date   Bipolar 1 disorder ([place])    Depression    Hepatitis C    High cholesterol    Hypertension    UTI (urinary tract infection)     Past Surgical History:  Procedure Laterality Date   APPENDECTOMY     BREAST BIOPSY Right 03/04/2022   stereo bx, mass "X" clip-path pending   TOE AMPUTATION     Family History:  Family History  Problem Relation Age of Onset   Mental illness Mother        born in mental hospital   Breast cancer Neg Hx     Social History:  Social History   Substance and Sexual Activity  Alcohol Use Never     Social History   Substance and Sexual Activity  Drug Use Never    Social History   Socioeconomic History   Marital status: Legally Separated    Spouse name: Not on file   Number of children: Not on file   Years of education: Not on file   Highest education level: Not on file  Occupational History   Not on file  Tobacco Use   Smoking status: Former    Packs/day: 1.00    Years: 30.00    Total pack years: 30.00    Types: Cigarettes    Quit date: 2023    Years since quitting: 1.0   Smokeless tobacco: Never  Vaping Use   Vaping Use: Never used  Substance and Sexual Activity   Alcohol use: Never   Drug use: Never   Sexual activity: Not Currently    Partners: Male  Other Topics Concern   Not on file  Social History  Narrative   Not on file   Social Determinants of Health   Financial Resource Strain: Low Risk  (05/13/2022)   Overall Financial Resource Strain (CARDIA)    Difficulty of Paying Living Expenses: Not hard at all  Food Insecurity: No Food Insecurity (07/20/2022)   Hunger Vital Sign    Worried About Running Out of Food in the Last Year: Never true    [place] in the Last Year: Never true  Transportation Needs: Unmet Transportation Needs (07/20/2022)   PRAPARE - Transportation    Lack of Transportation (Medical): Yes    Lack of Transportation (Non-Medical): Yes  Physical Activity: Inactive (05/13/2022)   Exercise Vital Sign    Days of Exercise per Week: 0 days    Minutes of Exercise per Session: 0 min  Stress: No Stress Concern Present (05/13/2022)   [place]    Feeling of Stress : Not at all  Social Connections: Moderately Integrated (05/13/2022)   Social Connection and Isolation Panel [NHANES]    Frequency of Communication with Friends and Family: More than three times a week    Frequency of Social Gatherings with Friends and  Family: More than three times a week    Attends Religious Services: More than 4 times per year    Active Member of Clubs or Organizations: Yes    Attends Music therapist: More than 4 times per year    Marital Status: Separated   Additional Social History:                         Sleep: Good  Appetite:  Good  Current Medications: Current Facility-Administered Medications  Medication Dose Route Frequency Provider Last Rate Last Admin   acetaminophen (TYLENOL) tablet 650 mg  650 mg Oral Q6H PRN Waldon Merl F, NP       albuterol (VENTOLIN HFA) 108 (90 Base) MCG/ACT inhaler 2 puff  2 puff Inhalation Q6H PRN Caroline Sauger, NP   2 puff at 07/29/22 0313   alum & mag hydroxide-simeth (MAALOX/MYLANTA) 200-200-20 MG/5ML suspension 30 mL  30 mL Oral Q4H PRN  Waldon Merl F, NP       divalproex (DEPAKOTE) DR tablet 500 mg  500 mg Oral BID Waldon Merl F, NP   500 mg at 07/29/22 2094   doxylamine (Sleep) (UNISOM) tablet 25 mg  25 mg Oral QHS PRN Waldon Merl F, NP   25 mg at 07/21/22 2144   feeding supplement (ENSURE ENLIVE / ENSURE PLUS) liquid 237 mL  237 mL Oral BID BM Clapacs, [name], MD   237 mL at 07/29/22 0959   furosemide (LASIX) tablet 20 mg  20 mg Oral Daily Parks Ranger, DO   20 mg at 07/29/22 0959   ibuprofen (ADVIL) tablet 600 mg  600 mg Oral Q6H PRN Waldon Merl F, NP   600 mg at 07/27/22 1325   LORazepam (ATIVAN) tablet 1 mg  1 mg Oral Q4H PRN Parks Ranger, DO   1 mg at 07/29/22 0959   magnesium gluconate (MAGONATE) tablet 500 mg  500 mg Oral Daily Parks Ranger, DO   500 mg at 07/29/22 7096   magnesium hydroxide (MILK OF MAGNESIA) suspension 30 mL  30 mL Oral Daily PRN Waldon Merl F, NP       metoprolol succinate (TOPROL-XL) 24 hr tablet 50 mg  50 mg Oral Daily Parks Ranger, DO   50 mg at 07/29/22 0959   OLANZapine (ZYPREXA) tablet 10 mg  10 mg Oral QHS Parks Ranger, DO   10 mg at 07/28/22 2101   traZODone (DESYREL) tablet 50 mg  50 mg Oral QHS Waldon Merl F, NP   50 mg at 07/28/22 2101    Lab Results:  Results for orders placed or performed during the hospital encounter of 07/20/22 (from the past 48 hour(s))  Glucose, capillary     Status: Abnormal   Collection Time: 07/27/22  4:35 PM  Result Value Ref Range   Glucose-Capillary 153 (H) 70 - 99 mg/dL    Comment: Glucose reference range applies only to samples taken after fasting for at least 8 hours.  Glucose, capillary     Status: Abnormal   Collection Time: 07/28/22  7:43 AM  Result Value Ref Range   Glucose-Capillary 106 (H) 70 - 99 mg/dL    Comment: Glucose reference range applies only to samples taken after fasting for at least 8 hours.  Glucose, capillary     Status: Abnormal   Collection Time:  07/28/22  4:15 PM  Result Value Ref Range   Glucose-Capillary 189 (H) 70 - 99 mg/dL  Comment: Glucose reference range applies only to samples taken after fasting for at least 8 hours.  Glucose, capillary     Status: Abnormal   Collection Time: 07/29/22  7:39 AM  Result Value Ref Range   Glucose-Capillary 137 (H) 70 - 99 mg/dL    Comment: Glucose reference range applies only to samples taken after fasting for at least 8 hours.    Blood Alcohol level:  Lab Results  Component Value Date   ETH <10 07/19/2022   ETH <10 43/15/4008    Metabolic Disorder Labs: Lab Results  Component Value Date   HGBA1C 6.3 11/28/2021   MPG 134.11 12/14/2019   No results found for: "PROLACTIN" Lab Results  Component Value Date   CHOL 233 (H) 11/28/2021   TRIG 133.0 11/28/2021   HDL 71.50 11/28/2021   CHOLHDL 3 11/28/2021   VLDL 26.6 11/28/2021   LDLCALC 135 (H) 11/28/2021    Physical Findings: AIMS:  , ,  ,  ,    CIWA:    COWS:     Musculoskeletal: Strength & Muscle Tone: within normal limits Gait & Station: normal Patient leans: N/A  Psychiatric Specialty Exam:  Presentation  General Appearance:  Appropriate for Environment; Casual  Eye Contact: Fair  Speech: Clear and Coherent  Speech Volume: Increased  Handedness: Right   Mood and Affect  Mood: Anxious; Labile  Affect: Inappropriate; Full Range   Thought Process  Thought Processes: Disorganized; Irrevelant  Descriptions of Associations:Loose  Orientation:Partial  Thought Content:Illogical; Scattered  History of Schizophrenia/Schizoaffective disorder:Yes  Duration of Psychotic Symptoms:Greater than six months  Hallucinations:No data recorded Ideas of Reference:Paranoia  Suicidal Thoughts:No data recorded Homicidal Thoughts:No data recorded  Sensorium  Memory: Immediate Poor; Remote Poor  Judgment: Impaired  Insight: Lacking   Executive Functions  Concentration: Poor  Attention  Span: Poor  Recall: Poor  Fund of Knowledge: Poor  Language: Poor   Psychomotor Activity  Psychomotor Activity:No data recorded  Assets  Assets: Desire for Improvement; Financial Resources/Insurance; Housing; Social Support   Sleep  Sleep:No data recorded   Physical Exam: Physical Exam Vitals and nursing note reviewed.  Constitutional:      Appearance: Normal appearance. She is normal weight.  Neurological:     General: No focal deficit present.     Mental Status: She is alert and oriented to person, place, and time.  Psychiatric:        Attention and Perception: Attention and perception normal.        Mood and Affect: Mood and affect normal.        Speech: Speech normal.        Behavior: Behavior normal. Behavior is cooperative.        Thought Content: Thought content normal.        Cognition and Memory: Cognition and memory normal.        Judgment: Judgment normal.    Review of Systems  Constitutional: Negative.   HENT: Negative.    Eyes: Negative.   Respiratory: Negative.    Cardiovascular: Negative.   Gastrointestinal: Negative.   Genitourinary: Negative.   Musculoskeletal: Negative.   Skin: Negative.   Neurological: Negative.   Endo/Heme/Allergies: Negative.   Psychiatric/Behavioral: Negative.     Blood pressure (!) 141/78, pulse 75, temperature 97.6 F (36.4 C), temperature source Oral, resp. rate 18, SpO2 100 %. There is no height or weight on file to calculate BMI.   Treatment Plan Summary: Daily contact with patient to assess and evaluate symptoms and progress  in treatment, Medication management, and Plan I changed her blood pressure medicine yesterday.  Her blood pressure and pulse are a little bit better today.  Her potassium is lower than usual from looking at the charts for me to put her on low-dose potassium daily.  Start 10 mEq of potassium.  Continue current medications.  Discharge tomorrow.  Parks Ranger, DO 07/29/2022, 11:23  AM

## 2022-07-29 NOTE — BHH Counselor (Addendum)
CSW contacted with pt's daughter, Arnoldo Morale, [phone] to inform her of pt's pending discharge tomorrow.   She stated that she nor any family member are able to provide transportation due to work schedule of 12 hour shifts. CSW will assist pt with providing transportation.    She stated pt's great niece would be at pt's home at discharge, but she also cannot provide transportation because she does not have her license.   Shanesha Bednarz Martin[place], MSW, LCSW-A 1/10/202411:22 AM

## 2022-07-29 NOTE — B[facility] Group Notes (Signed)
[employer], and psychoeducational group held.  Two poems were read, and patients were encouraged to use reflection of previous experiences to help identify their strengths and recognize negative thought patterns.  Pt attended. Her thought patterns were circumstantial at times and she did require redirection as she frequently interrupted others.

## 2022-07-29 NOTE — Progress Notes (Signed)
   07/29/22 0740  Psychosocial Assessment  Eye Contact Fair  Facial Expression Animated  Affect Preoccupied  Speech Logical/coherent  Interaction Assertive  Motor Activity Shuffling  Appearance/Hygiene In scrubs  Thought Process  Coherency Circumstantial  Content Preoccupation  Delusions None reported or observed  Perception WDL  Hallucination None reported or observed  Judgment Impaired  Confusion Mild  Danger to Self  Current suicidal ideation? Denies  Danger to Others  Danger to Others None reported or observed

## 2022-07-29 NOTE — Plan of Care (Signed)

## 2022-07-30 LAB — GLUCOSE, CAPILLARY: Glucose-Capillary: 102 mg/dL — ABNORMAL HIGH (ref 70–99)

## 2022-07-30 MED ORDER — METOPROLOL SUCCINATE ER 50 MG PO TB24: 50.0000 mg | ORAL_TABLET | Freq: Every day | ORAL | 3 refills | Status: DC

## 2022-07-30 MED ORDER — TRAZODONE HCL 50 MG PO TABS: 50.0000 mg | ORAL_TABLET | Freq: Every day | ORAL | 3 refills | Status: DC

## 2022-07-30 MED ORDER — DIVALPROEX SODIUM 500 MG PO DR TAB: 500.0000 mg | DELAYED_RELEASE_TABLET | Freq: Two times a day (BID) | ORAL | 3 refills | Status: DC

## 2022-07-30 MED ORDER — FUROSEMIDE 20 MG PO TABS: 20.0000 mg | ORAL_TABLET | Freq: Every day | ORAL | 3 refills | Status: DC

## 2022-07-30 MED ORDER — POTASSIUM CHLORIDE CRYS ER 10 MEQ PO TBCR: 10.0000 meq | EXTENDED_RELEASE_TABLET | Freq: Every day | ORAL | 3 refills | Status: DC

## 2022-07-30 MED ORDER — OLANZAPINE 10 MG PO TABS: 10.0000 mg | ORAL_TABLET | Freq: Every day | ORAL | 3 refills | Status: DC

## 2022-07-30 NOTE — Discharge Summary (Signed)
Physician Discharge Summary Note  Patient:  Shelby Jordan is an 72 y.o., female MRN:  [id number] DOB:  06-12-1951 Patient phone:  [phone] (home)  Patient address:   [address] [place] [address],  Total Time spent with patient: 1 hour  Date of Admission:  07/20/2022 Date of Discharge: 07/30/2022  Reason for Admission:   Shelby Jordan is a 72 year old African-American female who was voluntarily admitted to geriatric psychiatry for manic behavior.  She lives with her daughter in [place].  Daughter brought her into the emergency room.  She apparently ran out of her medications.  She says that she does well on Depakote.  She is originally from [place] and moved down to be with her daughter who has been here for 4 years.  She is pleasant and cooperative.  She says she does well on Benadryl at bedtime.  She does not have a psychiatrist.  She has been hospitalized more than 10 times between [place] and [place].  She was hospitalized at [facility] 2 years ago and was on Depakote and lithium.  She has chronic kidney disease.  She also has type 2 diabetes.  She has a daughter that she lives with and a son who lives in [place].  She denies any auditory or visual hallucinations.  She denies any suicidal ideation.  Her thought process is somewhat tangential and her speech is mildly pressured.  From reviewing the chart it sounds like she has not been sleeping very well.   Principal Problem: Bipolar 1 disorder [facility]) Discharge Diagnoses: Principal Problem:   Bipolar 1 disorder ([place])   Past Psychiatric History: More than 10 inpatient hospitalizations. She currently does not have a psychiatrist. Long history of bipolar disorder.    Past Medical History:  Past Medical History:  Diagnosis Date   Bipolar 1 disorder ([place])    Depression    Hepatitis C    High cholesterol    Hypertension    UTI (urinary tract infection)     Past Surgical History:  Procedure Laterality Date   APPENDECTOMY     BREAST  BIOPSY Right 03/04/2022   stereo bx, mass "X" clip-path pending   TOE AMPUTATION     Family History:  Family History  Problem Relation Age of Onset   Mental illness Mother        born in mental hospital   Breast cancer Neg Hx    Family Psychiatric  History: Unremarkable Social History:  Social History   Substance and Sexual Activity  Alcohol Use Never     Social History   Substance and Sexual Activity  Drug Use Never    Social History   Socioeconomic History   Marital status: Legally Separated    Spouse name: Not on file   Number of children: Not on file   Years of education: Not on file   Highest education level: Not on file  Occupational History   Not on file  Tobacco Use   Smoking status: Former    Packs/day: 1.00    Years: 30.00    Total pack years: 30.00    Types: Cigarettes    Quit date: 2023    Years since quitting: 1.0   Smokeless tobacco: Never  Vaping Use   Vaping Use: Never used  Substance and Sexual Activity   Alcohol use: Never   Drug use: Never   Sexual activity: Not Currently    Partners: Male  Other Topics Concern   Not on file  Social  History Narrative   Not on file   Social Determinants of Health   Financial Resource Strain: Low Risk  (05/13/2022)   Overall Financial Resource Strain (CARDIA)    Difficulty of Paying Living Expenses: Not hard at all  Food Insecurity: No Food Insecurity (07/20/2022)   Hunger Vital Sign    Worried About Running Out of Food in the Last Year: Never true    Ran Out of Food in the Last Year: Never true  Transportation Needs: Unmet Transportation Needs (07/20/2022)   PRAPARE - Hydrologist (Medical): Yes    Lack of Transportation (Non-Medical): Yes  Physical Activity: Inactive (05/13/2022)   Exercise Vital Sign    Days of Exercise per Week: 0 days    Minutes of Exercise per Session: 0 min  Stress: No Stress Concern Present (05/13/2022)   [place]    Feeling of Stress : Not at all  Social Connections: Moderately Integrated (05/13/2022)   Social Connection and Isolation Panel [NHANES]    Frequency of Communication with Friends and Family: More than three times a week    Frequency of Social Gatherings with Friends and Family: More than three times a week    Attends Religious Services: More than 4 times per year    Active Member of [employer] or Organizations: Yes    Attends Archivist Meetings: More than 4 times per year    Marital Status: Separated    Hospital Course: Shelby Jordan is a 72 year old African-American female who was voluntarily admitted to geriatric psychiatry.  She had not been taking her medications on a regular basis and was becoming more agitated, tangential, and irritable.  She has a long history of schizoaffective, bipolar disorder and had been treated for years on an outpatient basis in [place] until she moved to [place] 4 years ago.  She does not have a provider here.  She had recently been discharged from [facility] and had visited [facility] but has not had any medication refills.  Daughter brought her to the emergency room.  While on the inpatient unit she was restarted on her medications which included Depakote, Zyprexa and her blood pressure medications which were titrated up and they included Lasix, Toprol-XL, potassium.  Trazodone was restarted.  She slept well and her appetite was good.  She was compliant with her medications and denied any side effects.  She participated in groups and therapy and interacted well with staff and peers.  He was felt that she maximized hospitalization she was discharged home.  On the day of discharge she denied suicidal ideation, homicidal ideation, auditory or visual hallucinations.  Her judgment and insight were good.  Physical Findings: AIMS:  , ,  ,  ,    CIWA:    COWS:     Musculoskeletal: Strength & Muscle Tone: within normal  limits Gait & Station: normal Patient leans: N/A   Psychiatric Specialty Exam:  Presentation  General Appearance:  Appropriate for Environment; Casual  Eye Contact: Fair  Speech: Clear and Coherent  Speech Volume: Increased  Handedness: Right   Mood and Affect  Mood: Anxious; Labile  Affect: Inappropriate; Full Range   Thought Process  Thought Processes: Disorganized; Irrevelant  Descriptions of Associations:Loose  Orientation:Partial  Thought Content:Illogical; Scattered  History of Schizophrenia/Schizoaffective disorder:Yes  Duration of Psychotic Symptoms:Greater than six months  Hallucinations:No data recorded Ideas of Reference:Paranoia  Suicidal Thoughts:No data recorded Homicidal Thoughts:No data recorded  Sensorium  Memory: Immediate Poor; Remote Poor  Judgment: Impaired  Insight: Lacking   Executive Functions  Concentration: Poor  Attention Span: Poor  Recall: Poor  Fund of Knowledge: Poor  Language: Poor   Psychomotor Activity  Psychomotor Activity:No data recorded  Assets  Assets: Desire for Improvement; Financial Resources/Insurance; Housing; Social Support   Sleep  Sleep:No data recorded   Physical Exam: Physical Exam Vitals and nursing note reviewed.  Constitutional:      Appearance: Normal appearance. She is normal weight.  Neurological:     General: No focal deficit present.     Mental Status: She is alert and oriented to person, place, and time.  Psychiatric:        Attention and Perception: Attention and perception normal.        Mood and Affect: Mood and affect normal.        Speech: Speech normal.        Behavior: Behavior normal. Behavior is cooperative.        Thought Content: Thought content normal.        Cognition and Memory: Cognition and memory normal.        Judgment: Judgment normal.    Review of Systems  Constitutional: Negative.   HENT: Negative.    Eyes: Negative.    Respiratory: Negative.    Cardiovascular: Negative.   Gastrointestinal: Negative.   Genitourinary: Negative.   Musculoskeletal: Negative.   Skin: Negative.   Neurological: Negative.   Endo/Heme/Allergies: Negative.   Psychiatric/Behavioral: Negative.     Blood pressure 132/68, pulse 72, temperature 98 F (36.7 C), temperature source Oral, resp. rate 18, SpO2 99 %. There is no height or weight on file to calculate BMI.   Social History   Tobacco Use  Smoking Status Former   Packs/day: 1.00   Years: 30.00   Total pack years: 30.00   Types: Cigarettes   Quit date: 2023   Years since quitting: 1.0  Smokeless Tobacco Never   Tobacco Cessation:  A prescription for an FDA-approved tobacco cessation medication was offered at discharge and the patient refused   Blood Alcohol level:  Lab Results  Component Value Date   ETH <10 07/19/2022   ETH <10 16/04/9603    Metabolic Disorder Labs:  Lab Results  Component Value Date   HGBA1C 6.3 11/28/2021   MPG 134.11 12/14/2019   No results found for: "PROLACTIN" Lab Results  Component Value Date   CHOL 233 (H) 11/28/2021   TRIG 133.0 11/28/2021   HDL 71.50 11/28/2021   CHOLHDL 3 11/28/2021   VLDL 26.6 11/28/2021   LDLCALC 135 (H) 11/28/2021    See Psychiatric Specialty Exam and Suicide Risk Assessment completed by Attending Physician prior to discharge.  Discharge destination:  Home  Is patient on multiple antipsychotic therapies at discharge:  No   Has Patient had three or more failed trials of antipsychotic monotherapy by history:  No  Recommended Plan for Multiple Antipsychotic Therapies: NA   Allergies as of 07/30/2022       Reactions   Tomato (diagnostic) Other (See Comments)   Tongue break out.    Citrus Rash        Medication List     TAKE these medications      Indication  divalproex 500 MG DR tablet Commonly known as: DEPAKOTE Take 1 tablet (500 mg total) by mouth 2 (two) times daily.   Indication: Manic Phase of Manic-Depression   doxylamine (Sleep) 25 MG tablet Commonly known as:  UNISOM Take 25 mg by mouth at bedtime as needed for sleep or rhinitis.    furosemide 20 MG tablet Commonly known as: LASIX Take 1 tablet (20 mg total) by mouth daily. Start taking on: July 31, 2022  Indication: High Blood Pressure Disorder   magnesium gluconate 500 MG tablet Commonly known as: MAGONATE Take 500 mg by mouth at bedtime.    metoprolol succinate 50 MG 24 hr tablet Commonly known as: TOPROL-XL Take 1 tablet (50 mg total) by mouth daily. Start taking on: July 31, 2022    OLANZapine 10 MG tablet Commonly known as: ZYPREXA Take 1 tablet (10 mg total) by mouth at bedtime. What changed:  medication strength how much to take  Indication: Manic Phase of Manic-Depression   potassium chloride 10 MEQ tablet Commonly known as: KLOR-CON M Take 1 tablet (10 mEq total) by mouth daily. Start taking on: July 31, 2022  Indication: Low Amount of Potassium in the Blood   traZODone 50 MG tablet Commonly known as: DESYREL Take 1 tablet (50 mg total) by mouth at bedtime.  Indication: Behavioral Disorders associated with Dementia, Trouble Sleeping        Follow-up Information     [facility] Follow up on 08/10/2022.   Why: You have a follow up appointment scheduled for Monday January 22nd at 2pm. Thanks! Please contact their office reschedule your appointment. Contact information: [place] [address] [phone]                 Follow-up recommendations:  [facility]    Signed: Parks Ranger, DO 07/30/2022, 10:49 AM

## 2022-07-30 NOTE — Progress Notes (Signed)
D: Pt alert and oriented. Pt denies experiencing any pain, SI/HI, or AVH at this time. Pt reports she will be able to keep herself safe when they return home. Pt has completed a suicide safety plan and was given a survey to fill out.  A: Pt received discharge and medication education/information. Pt belongings were returned and confirmed all present. Pt received clothing to wear for discharge from [facility].    R: Pt verbalized understanding of discharge and medication education/information.  Pt escorted to medical mall front lobby where safe transport picked pt up and transported home.

## 2022-07-30 NOTE — BHH Counselor (Signed)
CSW contacted pt's daughter, Wilhelmenia Blase, [phone], to confirm discharge and discuss transportation.   She stated that she would not be able to provide transportation due to working a 12 hour shift. She stated that pt's granddaughter would be at the house when pt arrived and could be dropped off at any time.   CSW will assist pt with obtaining transportation to pt's home.   Shaniqua Guillot [place], MSW, LCSW-A 1/11/202410:25 AM

## 2022-07-30 NOTE — Progress Notes (Signed)
Patient is calm and cooperative with assessment. She denies SI, HI, and AVH. She denies pain and other physical problems. Patient observed shuffling around the unit with an unsteady gait. Patient assisted to the bathroom. Patient has some urgency and could not make it to the bathroom before urinating. Patient needed moderate assist to get cleaned up and changed into new set of scrubs. Patient is compliant with scheduled medications.

## 2022-07-30 NOTE — BHH Group Notes (Signed)
Psychoeducational group completed.  Patients were given two poems to read, and asked within the framework of cognitive behavioral therapy and positive affirmations,  to identify one positive thought about themselves and one negative belief they would like to let go of.  Pt participated and was appropriate.

## 2022-07-30 NOTE — Care Management Important Message (Signed)
Important Message  Patient Details  Name: Shelby Jordan MRN: [id number] Date of Birth: 10-07-1950   Medicare Important Message Given:  Yes     Saraiah Bhat A [place], [place] 07/30/2022, 2:06 PM

## 2022-07-30 NOTE — Plan of Care (Signed)
Pt denies anxiety/depression at this time. Pt denies SI/HI/AVH or pain at this time. Pt is calm and cooperative. Pt is medication compliant. Pt provided with support and encouragement. Pt monitored q15 minutes for safety per unit policy. Plan of care ongoing.   Problem: Education: Goal: Knowledge of General Education information will improve Description: Including pain rating scale, medication(s)/side effects and non-pharmacologic comfort measures Outcome: Progressing   Problem: Activity: Goal: Risk for activity intolerance will decrease Outcome: Progressing

## 2022-07-30 NOTE — BHH Suicide Risk Assessment (Signed)
[facility] Discharge Suicide Risk Assessment   Principal Problem: Bipolar 1 disorder [facility]) Discharge Diagnoses: Principal Problem:   Bipolar 1 disorder ([place])   Total Time spent with patient: 1 hour  Musculoskeletal: Strength & Muscle Tone: within normal limits Gait & Station: normal Patient leans: N/A  Psychiatric Specialty Exam  Presentation  General Appearance:  Appropriate for Environment; Casual  Eye Contact: Fair  Speech: Clear and Coherent  Speech Volume: Increased  Handedness: Right   Mood and Affect  Mood: Anxious; Labile  Duration of Depression Symptoms: Less than two weeks  Affect: Inappropriate; Full Range   Thought Process  Thought Processes: Disorganized; Irrevelant  Descriptions of Associations:Loose  Orientation:Partial  Thought Content:Illogical; Scattered  History of Schizophrenia/Schizoaffective disorder:Yes  Duration of Psychotic Symptoms:Greater than six months  Hallucinations:No data recorded Ideas of Reference:Paranoia  Suicidal Thoughts:No data recorded Homicidal Thoughts:No data recorded  Sensorium  Memory: Immediate Poor; Remote Poor  Judgment: Impaired  Insight: Lacking   Executive Functions  Concentration: Poor  Attention Span: Poor  Recall: Poor  Fund of Knowledge: Poor  Language: Poor   Psychomotor Activity  Psychomotor Activity:No data recorded  Assets  Assets: Desire for Improvement; Financial Resources/Insurance; Housing; Social Support   Sleep  Sleep:No data recorded   Blood pressure 132/68, pulse 72, temperature 98 F (36.7 C), temperature source Oral, resp. rate 18, SpO2 99 %. There is no height or weight on file to calculate BMI.  Mental Status Per Nursing Assessment::   On Admission:  NA  Demographic Factors:  Age 34 or older and Caucasian  Loss Factors: NA  Historical Factors: Anniversary of important loss and NA  Risk Reduction Factors:   NA  Continued Clinical  Symptoms:  Bipolar Disorder:   Depressive phase  Cognitive Features That Contribute To Risk:  None    Suicide Risk:  Minimal: No identifiable suicidal ideation.  Patients presenting with no risk factors but with morbid ruminations; may be classified as minimal risk based on the severity of the depressive symptoms   Follow-up [facility] Follow up on 08/10/2022.   Why: You have a follow up appointment scheduled for Monday January 22nd at 2pm. Thanks! Please contact their office reschedule your appointment. Contact information: [place] [address] [phone]                 Plan Of Care/Follow-up recommendations: [place], DO 07/30/2022, 10:26 AM

## 2022-07-30 NOTE — Progress Notes (Signed)
  [facility] Adult Case Management Discharge Plan :  Will you be returning to the same living situation after discharge:  Yes,  pt will be returning home At discharge, do you have transportation home?: Yes,  CSW will assist pt with obtaining transportation Do you have the ability to pay for your medications: Yes,  pt has [facility] Medicare  Release of information consent forms completed and in the chart;  Patient's signature needed at discharge.  Patient to Follow up at:  [facility]-up [facility] Follow up on 08/10/2022.   Why: You have a follow up appointment scheduled for Monday January 22nd at 2pm. Thanks! Please contact their office reschedule your appointment. Contact information: [place] [address] [phone]                 Next level of care provider has access to [place] and Suicide Prevention discussed: Yes,  SPE completed with pt's daughter, Shelby Jordan     Has patient been referred to the Quitline?: Patient refused referral  Patient has been referred for addiction treatment: N/A  Shelby Jordan, [place] 07/30/2022, 10:27 AM

## 2022-07-31 ENCOUNTER — Telehealth: Payer: Self-pay | Admitting: Family

## 2022-07-31 ENCOUNTER — Telehealth: Payer: Self-pay

## 2022-07-31 NOTE — Telephone Encounter (Signed)
Pt's daughter, Sherlynn [place] called back returning kelly's missed call. Lashawn requested a call back @ [phone]

## 2022-07-31 NOTE — Patient Outreach (Signed)
  Care Coordination [facility] Note Transition Care Management Unsuccessful Follow-up Telephone Call  Date of discharge and from where:  [facility] 07/29/22  Attempts:  1st Attempt  Reason for unsuccessful TCM follow-up call:  No answer/busy  Johnney Killian, RN, BSN, CCM Care Management Coordinator [facility] Phone: [phone]: [phone]

## 2022-08-03 ENCOUNTER — Telehealth: Payer: Self-pay

## 2022-08-03 NOTE — Telephone Encounter (Signed)
See other phone note for further documentation

## 2022-08-03 NOTE — Telephone Encounter (Signed)
Left voicemail on patients daughter line, per dpr. Advised patient of Tabitha's message and to call out office back if she has any further questions.

## 2022-08-03 NOTE — Patient Outreach (Signed)
  Care Coordination [facility] Note Transition Care Management Unsuccessful Follow-up Telephone Call  Date of discharge and from where:  [facility] 07/29/22  Attempts:  2nd Attempt  Reason for unsuccessful TCM follow-up call:  Left voice message  Johnney Killian, RN, BSN, CCM Care Management Coordinator [facility] Phone: [phone]: [phone]

## 2022-08-04 ENCOUNTER — Telehealth: Payer: Self-pay

## 2022-08-04 NOTE — Patient Outreach (Signed)
  Care Coordination [facility] Note Transition Care Management Unsuccessful Follow-up Telephone Call  Date of discharge and from where:  [facility] 07/29/22  Attempts:  3rd Attempt  Reason for unsuccessful TCM follow-up call:  No answer/busy  Johnney Killian, RN, BSN, CCM Care Management Coordinator [facility] Phone: [phone]: [phone]

## 2022-08-05 ENCOUNTER — Encounter: Payer: Self-pay | Admitting: *Deleted

## 2022-08-05 ENCOUNTER — Telehealth: Payer: Self-pay | Admitting: *Deleted

## 2022-08-05 NOTE — Patient Outreach (Signed)
  Care Coordination   08/05/2022 Name: Shelby Jordan MRN: [id number] DOB: 1951/04/04   Care Coordination Outreach Attempts:  An unsuccessful telephone outreach was attempted today to offer the patient information about available care coordination services as a benefit of their health plan.   Follow Up Plan:  Additional outreach attempts will be made to offer the patient care coordination information and services.   Encounter Outcome:  No Answer   Care Coordination Interventions:  No, not indicated    Kadeidra Coryell, [place] Worker   [facility] Care Management [phone]

## 2022-08-06 ENCOUNTER — Ambulatory Visit: Payer: Medicare Other | Admitting: Podiatry

## 2022-08-27 ENCOUNTER — Ambulatory Visit: Payer: 59 | Admitting: Family

## 2022-08-31 ENCOUNTER — Encounter: Payer: Self-pay | Admitting: Family

## 2022-08-31 ENCOUNTER — Ambulatory Visit (INDEPENDENT_AMBULATORY_CARE_PROVIDER_SITE_OTHER): Payer: 59 | Admitting: Family

## 2022-08-31 ENCOUNTER — Ambulatory Visit (INDEPENDENT_AMBULATORY_CARE_PROVIDER_SITE_OTHER)
Admission: RE | Admit: 2022-08-31 | Discharge: 2022-08-31 | Disposition: A | Discharge status: Home / Self Care | Payer: 59 | Source: Ambulatory Visit | Attending: Family | Admitting: Family

## 2022-08-31 VITALS — BP 124/74 | HR 90 | Temp 97.2°F | Ht 64.0 in | Wt 205.0 lb

## 2022-08-31 DIAGNOSIS — R350 Frequency of micturition: Secondary | ICD-10-CM | POA: Diagnosis not present

## 2022-08-31 DIAGNOSIS — R062 Wheezing: Secondary | ICD-10-CM

## 2022-08-31 DIAGNOSIS — S79912A Unspecified injury of left hip, initial encounter: Secondary | ICD-10-CM

## 2022-08-31 DIAGNOSIS — F3110 Bipolar disorder, current episode manic without psychotic features, unspecified: Secondary | ICD-10-CM

## 2022-08-31 DIAGNOSIS — S79911A Unspecified injury of right hip, initial encounter: Secondary | ICD-10-CM | POA: Diagnosis not present

## 2022-08-31 DIAGNOSIS — M25551 Pain in right hip: Secondary | ICD-10-CM | POA: Diagnosis not present

## 2022-08-31 DIAGNOSIS — R0602 Shortness of breath: Secondary | ICD-10-CM

## 2022-08-31 LAB — POC COVID19 BINAXNOW: SARS Coronavirus 2 Ag: POSITIVE — AB

## 2022-08-31 MED ORDER — AZITHROMYCIN 250 MG PO TABS: ORAL_TABLET | ORAL | 0 refills | Status: AC

## 2022-08-31 MED ORDER — ALBUTEROL SULFATE HFA 108 (90 BASE) MCG/ACT IN AERS: 2.0000 | INHALATION_SPRAY | RESPIRATORY_TRACT | 0 refills | Status: DC | PRN

## 2022-08-31 NOTE — Progress Notes (Signed)
Established Patient Office Visit  Subjective:   Patient ID: Shelby Jordan, female    DOB: 10/26/1950  Age: 72 y.o. MRN: [id number]  CC:  Chief Complaint  Patient presents with   Medical Management of Chronic Issues   Cough    X2 days Congestion, fever, cough, headaches Declines sore throat, body aches    HPI: Shelby Jordan is a 72 y.o. female presenting on 08/31/2022 for Medical Management of Chronic Issues and Cough (X2 days Congestion, fever, cough, headaches/Declines sore throat, body aches)   Cough    This am fell as she was trying to get out of her bed and then fell onto the floor. Didn't hit her head. Right now since the fall this am with right sided hip pain.   Sister just found to be covid positive.  C/o congestion, fever, cough and headaches. Declines sore throat and body aches.  Does have slight increasing sob.    Was voluntarily admitted to geriatric psychiatry for manic behavior. She still does not have a psychiatrist but is awaiting to make an appt. Daughter is with her today and states that she missed the call to make appt the other day but they will call back to schedule. While inpatient was started on depakote, zyprex and then started on lasix as well.   Daughter states has been peeing more often, and also peeing on herself more often than usual.        ROS: Negative unless specifically indicated above in HPI.   Relevant past medical history reviewed and updated as indicated.   Allergies and medications reviewed and updated.   Current Outpatient Medications:    albuterol (VENTOLIN HFA) 108 (90 Base) MCG/ACT inhaler, Inhale 2 puffs into the lungs every 4 (four) hours as needed for shortness of breath., Disp: 1 each, Rfl: 0   azithromycin (ZITHROMAX) 250 MG tablet, Take 2 tablets on day 1, then 1 tablet daily on days 2 through 5, Disp: 6 tablet, Rfl: 0   divalproex (DEPAKOTE) 500 MG DR tablet, Take 1 tablet (500 mg total) by mouth 2 (two) times  daily., Disp: 60 tablet, Rfl: 3   magnesium gluconate (MAGONATE) 500 MG tablet, Take 500 mg by mouth at bedtime., Disp: , Rfl:    metoprolol succinate (TOPROL-XL) 50 MG 24 hr tablet, Take 1 tablet (50 mg total) by mouth daily., Disp: 30 tablet, Rfl: 3   OLANZapine (ZYPREXA) 10 MG tablet, Take 1 tablet (10 mg total) by mouth at bedtime., Disp: 30 tablet, Rfl: 3   potassium chloride (KLOR-CON M) 10 MEQ tablet, Take 1 tablet (10 mEq total) by mouth daily., Disp: 30 tablet, Rfl: 3   traZODone (DESYREL) 50 MG tablet, Take 1 tablet (50 mg total) by mouth at bedtime., Disp: 30 tablet, Rfl: 3   furosemide (LASIX) 20 MG tablet, Take 1 tablet (20 mg total) by mouth daily. (Patient not taking: Reported on 08/31/2022), Disp: 30 tablet, Rfl: 3  Allergies  Allergen Reactions   Tomato (Diagnostic) Other (See Comments)    Tongue break out.    Citrus Rash    Objective:   BP 124/74   Pulse 90   Temp (!) 97.2 F (36.2 C) (Temporal)   Ht 5' 4"$  (1.626 m)   Wt 205 lb (93 kg)   SpO2 98%   BMI 35.19 kg/m    Physical Exam Constitutional:      General: She is not in acute distress.    Appearance: Normal appearance. She is obese. She is not  ill-appearing, toxic-appearing or diaphoretic.  HENT:     Head: Normocephalic.     Right Ear: Tympanic membrane normal.     Left Ear: Tympanic membrane normal.     Nose: Nose normal.     Mouth/Throat:     Mouth: Mucous membranes are dry.     Pharynx: No oropharyngeal exudate or posterior oropharyngeal erythema.  Eyes:     Extraocular Movements: Extraocular movements intact.     Pupils: Pupils are equal, round, and reactive to light.  Cardiovascular:     Rate and Rhythm: Normal rate and regular rhythm.     Pulses: Normal pulses.     Heart sounds: Normal heart sounds.  Pulmonary:     Effort: Pulmonary effort is normal.     Breath sounds: No decreased air movement. Examination of the right-upper field reveals wheezing. Examination of the left-upper field reveals  wheezing. Examination of the right-middle field reveals wheezing. Examination of the left-middle field reveals wheezing. Examination of the right-lower field reveals wheezing. Examination of the left-lower field reveals wheezing. Wheezing present. No decreased breath sounds, rhonchi or rales.  Musculoskeletal:     Cervical back: Normal range of motion.  Neurological:     General: No focal deficit present.     Mental Status: She is alert and oriented to person, place, and time. Mental status is at baseline.     Cranial Nerves: No cranial nerve deficit.     Sensory: Sensation is intact.     Motor: Weakness present.  Psychiatric:        Mood and Affect: Mood normal.        Behavior: Behavior normal.        Thought Content: Thought content normal.        Judgment: Judgment normal.     Assessment & Plan:  Hip injury, left, initial encounter  Urinary frequency -     POCT Urinalysis Dipstick (Automated)  Wheezing -     Azithromycin; Take 2 tablets on day 1, then 1 tablet daily on days 2 through 5  Dispense: 6 tablet; Refill: 0 -     DG Chest 2 View; Future -     Albuterol Sulfate HFA; Inhale 2 puffs into the lungs every 4 (four) hours as needed for shortness of breath.  Dispense: 1 each; Refill: 0  Shortness of breath Assessment & Plan: Albuterol inhaler prn  F/u in two weeks to evaluate if improvement in wheezing/sob as pt currently with covid If no improvement w/u for CHF  Orders: -     Azithromycin; Take 2 tablets on day 1, then 1 tablet daily on days 2 through 5  Dispense: 6 tablet; Refill: 0 -     DG Chest 2 View; Future  Hip injury, right, initial encounter Assessment & Plan: Hip xray today pending results  Orders: -     DG HIP UNILAT W OR W/O PELVIS 2-3 VIEWS RIGHT; Future  Bipolar I disorder, most recent episode (or current) manic ([place]) Assessment & Plan: Instructed pt and daughter to make appt with psychiatry as unable to refill these medications Also recommend  therapy     Urinary frequency, pt was unable to void at visit. Sent home with specimen cup, r/o UTI as well as for most recent urinary incontinence   Follow up plan: Return in about 2 weeks (around 09/14/2022) for f/u wheezing.  Eugenia Pancoast, FNP

## 2022-08-31 NOTE — Assessment & Plan Note (Signed)
Hip xray today pending results

## 2022-08-31 NOTE — Assessment & Plan Note (Addendum)
Albuterol inhaler prn  F/u in two weeks to evaluate if improvement in wheezing/sob as pt currently with covid If no improvement w/u for CHF

## 2022-08-31 NOTE — Progress Notes (Signed)
Hip negative for acute fracture.

## 2022-08-31 NOTE — Assessment & Plan Note (Signed)
Instructed pt and daughter to make appt with psychiatry as unable to refill these medications Also recommend therapy

## 2022-08-31 NOTE — Progress Notes (Signed)
CXR negative for pneumonia but does suggest bronchitis. Sent in [place] to pharmacy pt to get started on this.

## 2022-09-01 NOTE — Addendum Note (Signed)
Addended by: Pat Kocher on: 09/01/2022 01:08 PM   Modules accepted: Orders

## 2022-09-01 NOTE — Progress Notes (Signed)
Covid positive treated in office.

## 2022-09-27 ENCOUNTER — Other Ambulatory Visit: Payer: Self-pay | Admitting: Family

## 2022-09-27 DIAGNOSIS — R062 Wheezing: Secondary | ICD-10-CM

## 2022-11-09 ENCOUNTER — Ambulatory Visit: Payer: 59 | Admitting: Family

## 2022-11-16 ENCOUNTER — Ambulatory Visit: Payer: 59 | Admitting: Family

## 2022-11-17 ENCOUNTER — Encounter: Payer: Self-pay | Admitting: Family

## 2022-12-10 ENCOUNTER — Ambulatory Visit (INDEPENDENT_AMBULATORY_CARE_PROVIDER_SITE_OTHER): Payer: 59 | Admitting: Family

## 2022-12-10 ENCOUNTER — Encounter: Payer: Self-pay | Admitting: Family

## 2022-12-10 VITALS — BP 144/88 | HR 82 | Temp 97.6°F | Ht 64.0 in | Wt 207.2 lb

## 2022-12-10 DIAGNOSIS — N1831 Chronic kidney disease, stage 3a: Secondary | ICD-10-CM | POA: Diagnosis not present

## 2022-12-10 DIAGNOSIS — F250 Schizoaffective disorder, bipolar type: Secondary | ICD-10-CM

## 2022-12-10 DIAGNOSIS — J4540 Moderate persistent asthma, uncomplicated: Secondary | ICD-10-CM

## 2022-12-10 DIAGNOSIS — J449 Chronic obstructive pulmonary disease, unspecified: Secondary | ICD-10-CM

## 2022-12-10 DIAGNOSIS — R062 Wheezing: Secondary | ICD-10-CM

## 2022-12-10 DIAGNOSIS — I10 Essential (primary) hypertension: Secondary | ICD-10-CM | POA: Diagnosis not present

## 2022-12-10 DIAGNOSIS — E559 Vitamin D deficiency, unspecified: Secondary | ICD-10-CM | POA: Diagnosis not present

## 2022-12-10 DIAGNOSIS — E876 Hypokalemia: Secondary | ICD-10-CM

## 2022-12-10 DIAGNOSIS — F3110 Bipolar disorder, current episode manic without psychotic features, unspecified: Secondary | ICD-10-CM

## 2022-12-10 DIAGNOSIS — R739 Hyperglycemia, unspecified: Secondary | ICD-10-CM

## 2022-12-10 DIAGNOSIS — R0602 Shortness of breath: Secondary | ICD-10-CM

## 2022-12-10 DIAGNOSIS — R350 Frequency of micturition: Secondary | ICD-10-CM | POA: Diagnosis not present

## 2022-12-10 DIAGNOSIS — N3946 Mixed incontinence: Secondary | ICD-10-CM | POA: Insufficient documentation

## 2022-12-10 LAB — COMPREHENSIVE METABOLIC PANEL
ALT: 13 U/L (ref 0–35)
AST: 16 U/L (ref 0–37)
Albumin: 4.3 g/dL (ref 3.5–5.2)
Alkaline Phosphatase: 38 U/L — ABNORMAL LOW (ref 39–117)
BUN: 15 mg/dL (ref 6–23)
CO2: 30 mEq/L (ref 19–32)
Calcium: 9.9 mg/dL (ref 8.4–10.5)
Chloride: 104 mEq/L (ref 96–112)
Creatinine, Ser: 1.21 mg/dL — ABNORMAL HIGH (ref 0.40–1.20)
GFR: 45.12 mL/min — ABNORMAL LOW (ref >60.00)
Glucose, Bld: 111 mg/dL — ABNORMAL HIGH (ref 70–99)
Potassium: 4.7 mEq/L (ref 3.5–5.1)
Sodium: 141 mEq/L (ref 135–145)
Total Bilirubin: 0.5 mg/dL (ref 0.2–1.2)
Total Protein: 7.4 g/dL (ref 6.0–8.3)

## 2022-12-10 LAB — VITAMIN D 25 HYDROXY (VIT D DEFICIENCY, FRACTURES): VITD: 43.99 ng/mL (ref 30.00–100.00)

## 2022-12-10 LAB — HEMOGLOBIN A1C: Hgb A1c MFr Bld: 6.4 % (ref 4.6–6.5)

## 2022-12-10 MED ORDER — FLUTICASONE-SALMETEROL 100-50 MCG/ACT IN AEPB: 1.0000 | INHALATION_SPRAY | Freq: Two times a day (BID) | RESPIRATORY_TRACT | 3 refills | Status: DC

## 2022-12-10 MED ORDER — AMLODIPINE BESYLATE 5 MG PO TABS: 5.0000 mg | ORAL_TABLET | Freq: Every day | ORAL | 3 refills | Status: DC

## 2022-12-10 MED ORDER — BLOOD PRESSURE CUFF MISC: 1.0000 | Freq: Every day | 0 refills | Status: DC

## 2022-12-10 MED ORDER — DIVALPROEX SODIUM 500 MG PO DR TAB: 500.0000 mg | DELAYED_RELEASE_TABLET | Freq: Two times a day (BID) | ORAL | 0 refills | Status: DC

## 2022-12-10 MED ORDER — METOPROLOL SUCCINATE ER 50 MG PO TB24: 50.0000 mg | ORAL_TABLET | Freq: Every day | ORAL | 3 refills | Status: DC

## 2022-12-10 MED ORDER — ALBUTEROL SULFATE HFA 108 (90 BASE) MCG/ACT IN AERS: 2.0000 | INHALATION_SPRAY | RESPIRATORY_TRACT | 0 refills | Status: DC | PRN

## 2022-12-10 NOTE — Patient Instructions (Addendum)
------------------------------------    SENDING IN NEW ADVAIR INHALER TO USE DAILY. ALSO CAN USE ALBUTEROL BUT ONLY AS NEEDED.  GOING TO START AMLODIPINE FOR HIGH BLOOD PRESSURE, TAKE ONCE DAILY.    ------------------------------------  Your social worker:   Verna Czech, LCSW Clinical Social Worker  [facility] Care Management [phone]  ------------------------------------  We need to find you guys another psychiatrist. I will place another referral however I would suggest you call [facility] as well.     ------------------------------------ Call the neurology office to make appointment. If they say you need another referral let me know.   Jolene Provost, MD  [phone] [facility] [address]  [facility]  [place], [place] [address]  [phone] (Work)  [phone] (Fax)   Stop by the lab prior to leaving today. I will notify you of your results once received.   ------------------------------------  STOP FUROSEMIDE AND STOP POTASSIUM.  Start amlodipine 5 mg once daily.   ------------------------------------  Regards,   Mort Sawyers FNP-C

## 2022-12-10 NOTE — Progress Notes (Signed)
Established Patient Office Visit  Subjective:      CC:  Chief Complaint  Patient presents with   Medical Management of Chronic Issues    HPI: Shelby Jordan is a 72 y.o. female presenting on 12/10/2022 for Medical Management of Chronic Issues . Urinary incontinence: over the last few months having night time accidents. Doesn't burn when she pees but it does have a strong urinary odor. Not drinking very much water.   Pt was hospitalized in 12/23 due to increasingly erratic behavior. Was monitored for her psychiatric behavior and discharged on new medication. She tried to go for an appointment at [facility] but when they went for an appt they were told they didn't take her insurance.  Blood pressure was elevated back then as well 163/103  Does have home health nurse at home that sits with her about three hours a day.   Elevated BP: elevated for some time.  Daughter does report eats a lot of high salt foods such as sausage and bacon.   Sob/COPD EMPHYSEMA seen on CXR 2022 : when outside the trees give her asthma exacerbations. She uses the albuterol almost daily, twice daily.     Social history:  Relevant past medical, surgical, family and social history reviewed and updated as indicated. Interim medical history since our last visit reviewed.  Allergies and medications reviewed and updated.  DATA REVIEWED: CHART IN EPIC     ROS: Negative unless specifically indicated above in HPI.    Current Outpatient Medications:    amLODipine (NORVASC) 5 MG tablet, Take 1 tablet (5 mg total) by mouth daily., Disp: 90 tablet, Rfl: 3   Blood Pressure Monitoring (BLOOD PRESSURE CUFF) MISC, 1 each by Does not apply route daily., Disp: 1 each, Rfl: 0   fluticasone-salmeterol (ADVAIR) 100-50 MCG/ACT AEPB, Inhale 1 puff into the lungs 2 (two) times daily., Disp: 1 each, Rfl: 3   albuterol (VENTOLIN HFA) 108 (90 Base) MCG/ACT inhaler, Inhale 2 puffs into the lungs every 4 (four)  hours as needed for shortness of breath., Disp: 8.5 each, Rfl: 0   divalproex (DEPAKOTE) 500 MG DR tablet, Take 1 tablet (500 mg total) by mouth 2 (two) times daily., Disp: 60 tablet, Rfl: 0   magnesium gluconate (MAGONATE) 500 MG tablet, Take 500 mg by mouth at bedtime., Disp: , Rfl:    metoprolol succinate (TOPROL-XL) 50 MG 24 hr tablet, Take 1 tablet (50 mg total) by mouth daily., Disp: 30 tablet, Rfl: 3   OLANZapine (ZYPREXA) 10 MG tablet, Take 1 tablet (10 mg total) by mouth at bedtime., Disp: 30 tablet, Rfl: 3   potassium chloride (KLOR-CON M) 10 MEQ tablet, Take 1 tablet (10 mEq total) by mouth daily., Disp: 30 tablet, Rfl: 3   traZODone (DESYREL) 50 MG tablet, Take 1 tablet (50 mg total) by mouth at bedtime., Disp: 30 tablet, Rfl: 3      Objective:    BP (!) 144/88 (BP Location: Right Arm)   Pulse 82   Temp 97.6 F (36.4 C) (Temporal)   Ht 5\' 4"  (1.626 m)   Wt 207 lb 3.2 oz (94 kg)   SpO2 99%   BMI 35.57 kg/m   Wt Readings from Last 3 Encounters:  12/10/22 207 lb 3.2 oz (94 kg)  08/31/22 205 lb (93 kg)  06/25/22 201 lb 9.6 oz (91.4 kg)    Physical Exam  Physical Exam Constitutional:      General: not in acute distress.    Appearance:  Normal appearance. normal weight. is not ill-appearing, toxic-appearing or diaphoretic.  Cardiovascular:     Rate and Rhythm: Normal rate.  Pulmonary:     Effort: Pulmonary effort is normal.  Musculoskeletal:        General: Normal range of motion.  Neurological:     General: No focal deficit present.     Mental Status: alert and oriented to person, place, and time. Mental status is at baseline.  Psychiatric:        Mood and Affect: Mood normal.        Behavior: Behavior normal.        Thought Content: Thought content normal.        Judgment: Judgment normal.        Assessment & Plan:  Urinary frequency -     Urinalysis w microscopic + reflex cultur; Future  Wheezing -     Albuterol Sulfate HFA; Inhale 2 puffs into the  lungs every 4 (four) hours as needed for shortness of breath.  Dispense: 8.5 each; Refill: 0 -     Fluticasone-Salmeterol; Inhale 1 puff into the lungs 2 (two) times daily.  Dispense: 1 each; Refill: 3  Hyperglycemia Assessment & Plan: Pt advised of the following: Work on a diabetic diet, try to incorporate exercise at least 20-30 a day for 3 days a week or more.    Orders: -     Hemoglobin A1c  Chronic kidney disease, stage 3a (HCC) -     Comprehensive metabolic panel  Vitamin D deficiency Assessment & Plan: Ordered vitamin d pending results.    Orders: -     VITAMIN D 25 Hydroxy (Vit-D Deficiency, Fractures)  Hypokalemia  Primary hypertension Assessment & Plan: Continue metoprolol 50 mg once daily and amlodipine 5 mg once daily   Orders: -     Metoprolol Succinate ER; Take 1 tablet (50 mg total) by mouth daily.  Dispense: 30 tablet; Refill: 3 -     Comprehensive metabolic panel -     Blood Pressure Cuff; 1 each by Does not apply route daily.  Dispense: 1 each; Refill: 0 -     amLODIPine Besylate; Take 1 tablet (5 mg total) by mouth daily.  Dispense: 90 tablet; Refill: 3  Schizoaffective disorder, bipolar type (HCC) -     Divalproex Sodium; Take 1 tablet (500 mg total) by mouth 2 (two) times daily.  Dispense: 60 tablet; Refill: 0 -     Ambulatory referral to Psychiatry  Shortness of breath -     Albuterol Sulfate HFA; Inhale 2 puffs into the lungs every 4 (four) hours as needed for shortness of breath.  Dispense: 8.5 each; Refill: 0  Bipolar I disorder, most recent episode (or current) manic (HCC) -     Ambulatory referral to Psychiatry  Mixed stress and urge urinary incontinence  Moderate persistent asthma, unspecified whether complicated -     Fluticasone-Salmeterol; Inhale 1 puff into the lungs 2 (two) times daily.  Dispense: 1 each; Refill: 3  Chronic obstructive pulmonary disease, unspecified COPD type (HCC) Assessment & Plan: Confirmed on xray  Start advair   Albuterol prn instructed on proper use    Other orders -     REFLEXIVE URINE CULTURE     Return in about 3 weeks (around 12/31/2022) for f/u blood pressure.  Mort Sawyers, MSN, APRN, FNP-C [facility]

## 2022-12-12 LAB — URINALYSIS W MICROSCOPIC + REFLEX CULTURE
Bacteria, UA: NONE SEEN /HPF
Bilirubin Urine: NEGATIVE
Glucose, UA: NEGATIVE
Hgb urine dipstick: NEGATIVE
Hyaline Cast: NONE SEEN /LPF
Ketones, ur: NEGATIVE
Leukocyte Esterase: NEGATIVE
Nitrites, Initial: NEGATIVE
Specific Gravity, Urine: 1.019 (ref 1.001–1.035)
Squamous Epithelial / HPF: NONE SEEN /HPF (ref <=5)
WBC, UA: NONE SEEN /HPF (ref 0–5)
pH: 6 (ref 5.0–8.0)

## 2022-12-12 LAB — NO CULTURE INDICATED

## 2022-12-14 NOTE — Progress Notes (Signed)
Kidney function stable but on lower end. Pending ultrasound of the kidneys.  Increasing water intake is important to keep kidneys functioning appropriately Urine negative for infection Work on diet as diabetic prediabetes is increasing Vitamin d is good.

## 2022-12-15 DIAGNOSIS — J4540 Moderate persistent asthma, uncomplicated: Secondary | ICD-10-CM | POA: Insufficient documentation

## 2022-12-15 DIAGNOSIS — I10 Essential (primary) hypertension: Secondary | ICD-10-CM | POA: Insufficient documentation

## 2022-12-15 DIAGNOSIS — E559 Vitamin D deficiency, unspecified: Secondary | ICD-10-CM | POA: Insufficient documentation

## 2022-12-15 NOTE — Assessment & Plan Note (Signed)
Ordered vitamin d pending results.   

## 2022-12-15 NOTE — Assessment & Plan Note (Signed)
Confirmed on xray  Start advair  Albuterol prn instructed on proper use

## 2022-12-15 NOTE — Assessment & Plan Note (Signed)
Continue metoprolol 50 mg once daily and amlodipine 5 mg once daily

## 2022-12-15 NOTE — Assessment & Plan Note (Signed)
Pt advised of the following: Work on a diabetic diet, try to incorporate exercise at least 20-30 a day for 3 days a week or more.   

## 2023-01-11 ENCOUNTER — Encounter: Payer: 59 | Admitting: Family

## 2023-01-12 ENCOUNTER — Encounter: Payer: Self-pay | Admitting: Family

## 2023-02-01 ENCOUNTER — Ambulatory Visit: Payer: 59 | Admitting: Podiatry

## 2023-02-09 ENCOUNTER — Encounter: Payer: Self-pay | Admitting: Family

## 2023-02-09 ENCOUNTER — Telehealth: Payer: Self-pay | Admitting: *Deleted

## 2023-02-09 ENCOUNTER — Ambulatory Visit: Payer: 59 | Admitting: Family

## 2023-02-09 VITALS — BP 128/74 | HR 91 | Temp 97.3°F | Ht 64.0 in | Wt 206.0 lb

## 2023-02-09 DIAGNOSIS — F3110 Bipolar disorder, current episode manic without psychotic features, unspecified: Secondary | ICD-10-CM

## 2023-02-09 DIAGNOSIS — F250 Schizoaffective disorder, bipolar type: Secondary | ICD-10-CM | POA: Diagnosis not present

## 2023-02-09 DIAGNOSIS — R350 Frequency of micturition: Secondary | ICD-10-CM | POA: Diagnosis not present

## 2023-02-09 DIAGNOSIS — N3941 Urge incontinence: Secondary | ICD-10-CM

## 2023-02-09 DIAGNOSIS — F331 Major depressive disorder, recurrent, moderate: Secondary | ICD-10-CM

## 2023-02-09 DIAGNOSIS — Z741 Need for assistance with personal care: Secondary | ICD-10-CM | POA: Diagnosis not present

## 2023-02-09 DIAGNOSIS — Z9189 Other specified personal risk factors, not elsewhere classified: Secondary | ICD-10-CM | POA: Diagnosis not present

## 2023-02-09 MED ORDER — OLANZAPINE 10 MG PO TABS
10.0000 mg | ORAL_TABLET | Freq: Every day | ORAL | 0 refills | Status: DC
Start: 2023-02-09 — End: 2023-08-08

## 2023-02-09 MED ORDER — DIVALPROEX SODIUM 500 MG PO DR TAB
500.0000 mg | DELAYED_RELEASE_TABLET | Freq: Two times a day (BID) | ORAL | 0 refills | Status: AC
Start: 2023-02-09 — End: ?

## 2023-02-09 NOTE — Progress Notes (Signed)
  Care Coordination   Note   02/09/2023 Name: Graycie Halley MRN: [id number] DOB: June 29, 1951  Ettie Krontz is a 72 y.o. year old female who sees Mort Sawyers, FNP for primary care. I reached out to [employer] by phone today to offer care coordination services.  Ms. Capano was given information about Care Coordination services today including:   The Care Coordination services include support from the care team which includes your Nurse Coordinator, Clinical Social Worker, or Pharmacist.  The Care Coordination team is here to help remove barriers to the health concerns and goals most important to you. Care Coordination services are voluntary, and the patient may decline or stop services at any time by request to their care team member.   Care Coordination Consent Status: Patient agreed to services and verbal consent obtained.   Follow up plan:  Telephone appointment with care coordination team member scheduled for:  02/11/2023  Encounter Outcome:  Pt. Scheduled from referral   Burman Nieves, [facility] Care Coordination Care Guide Direct Dial: [phone]

## 2023-02-09 NOTE — Assessment & Plan Note (Signed)
Possible UTI however pt unable to void during visit.  Pt taking home specimen container and daughter states once she voids will return sample. We will obtain poct urine and then determine if culture needed and or medication prior to culture result.

## 2023-02-09 NOTE — Assessment & Plan Note (Addendum)
Still without compliance for psychiatry appointment  Gave daughter strict instructions that we will need her to see psychiatry for medication refills and management.  Also feel bipolar is not controlled, still fluctuates between mania and MDD.  Daughter states will call to make appt, also put in referral for CCM and also for a psychiatrist if this will provide assistance. Gave handout to daughter with phone number and address for walk in [facility] as well.   Will refill meds for thirty day supply so patient does not run out.

## 2023-02-09 NOTE — Progress Notes (Signed)
Established Patient Office Visit  Subjective:   Patient ID: Shelby Jordan, female    DOB: 09-10-1950  Age: 72 y.o. MRN: [id number]  CC:  Chief Complaint  Patient presents with   Urinary Frequency    X2 weeks ago  Urinary frequency, side pain, more confused than usual Denies dysuria, chills, fever    HPI: Shelby Jordan is a 72 y.o. female presenting on 02/09/2023 for Urinary Frequency (X2 weeks ago /Urinary frequency, side pain, more confused than usual/Denies dysuria, chills, fever)  Two weeks ago started with urinary frequency, increased confusion. No dysuria. No blood in urine.  Daughter with her and states that she has been wetting the bed, this is not new however.  No fever no chills.         ROS: Negative unless specifically indicated above in HPI.   Relevant past medical history reviewed and updated as indicated.   Allergies and medications reviewed and updated.   Current Outpatient Medications:    albuterol (VENTOLIN HFA) 108 (90 Base) MCG/ACT inhaler, Inhale 2 puffs into the lungs every 4 (four) hours as needed for shortness of breath., Disp: 8.5 each, Rfl: 0   amLODipine (NORVASC) 5 MG tablet, Take 1 tablet (5 mg total) by mouth daily., Disp: 90 tablet, Rfl: 3   Blood Pressure Monitoring (BLOOD PRESSURE CUFF) MISC, 1 each by Does not apply route daily., Disp: 1 each, Rfl: 0   fluticasone-salmeterol (ADVAIR) 100-50 MCG/ACT AEPB, Inhale 1 puff into the lungs 2 (two) times daily., Disp: 1 each, Rfl: 3   magnesium gluconate (MAGONATE) 500 MG tablet, Take 500 mg by mouth at bedtime., Disp: , Rfl:    metoprolol succinate (TOPROL-XL) 50 MG 24 hr tablet, Take 1 tablet (50 mg total) by mouth daily., Disp: 30 tablet, Rfl: 3   potassium chloride (KLOR-CON M) 10 MEQ tablet, Take 1 tablet (10 mEq total) by mouth daily., Disp: 30 tablet, Rfl: 3   traZODone (DESYREL) 50 MG tablet, Take 1 tablet (50 mg total) by mouth at bedtime., Disp: 30 tablet, Rfl: 3   divalproex  (DEPAKOTE) 500 MG DR tablet, Take 1 tablet (500 mg total) by mouth 2 (two) times daily., Disp: 60 tablet, Rfl: 0   OLANZapine (ZYPREXA) 10 MG tablet, Take 1 tablet (10 mg total) by mouth at bedtime., Disp: 30 tablet, Rfl: 0  Allergies  Allergen Reactions   Tomato (Diagnostic) Other (See Comments)    Tongue break out.    Citrus Rash    Objective:   BP 128/74   Pulse 91   Temp (!) 97.3 F (36.3 C) (Temporal)   Ht 5\' 4"  (1.626 m)   Wt 206 lb (93.4 kg)   SpO2 96%   BMI 35.36 kg/m    Physical Exam Constitutional:      General: She is not in acute distress.    Appearance: Normal appearance. She is normal weight. She is not ill-appearing, toxic-appearing or diaphoretic.  Cardiovascular:     Rate and Rhythm: Normal rate.  Pulmonary:     Effort: Pulmonary effort is normal.  Abdominal:     General: Abdomen is flat.     Tenderness: There is no abdominal tenderness. There is no right CVA tenderness or left CVA tenderness.  Neurological:     General: No focal deficit present.     Mental Status: She is alert and oriented to person, place, and time. Mental status is at baseline.     Motor: No weakness.  Psychiatric:  Mood and Affect: Mood normal.        Behavior: Behavior normal.        Thought Content: Thought content normal.        Judgment: Judgment normal.   The year is 1984 per patient Today is Tuesday  Biden is president     Assessment & Plan:  Urinary frequency Assessment & Plan: Possible UTI however pt unable to void during visit.  Pt taking home specimen container and daughter states once she voids will return sample. We will obtain poct urine and then determine if culture needed and or medication prior to culture result.    Orders: -     POCT Urinalysis Dipstick (Automated); Future  Schizoaffective disorder, bipolar type (HCC) -     Ambulatory referral to Psychiatry -     AMB Referral to [facility] Coordinaton (ACO Patients) -     Divalproex Sodium;  Take 1 tablet (500 mg total) by mouth 2 (two) times daily.  Dispense: 60 tablet; Refill: 0 -     OLANZapine; Take 1 tablet (10 mg total) by mouth at bedtime.  Dispense: 30 tablet; Refill: 0  At risk for self care deficit -     Ambulatory referral to Psychiatry -     AMB Referral to [facility] Coordinaton (ACO Patients)  Bipolar I disorder, most recent episode (or current) manic (HCC) Assessment & Plan: Still without compliance for psychiatry appointment  Gave daughter strict instructions that we will need her to see psychiatry for medication refills and management.  Also feel bipolar is not controlled, still fluctuates between mania and MDD.  Daughter states will call to make appt, also put in referral for CCM and also for a psychiatrist if this will provide assistance. Gave handout to daughter with phone number and address for walk in [facility] as well.   Will refill meds for thirty day supply so patient does not run out.      Orders: -     Ambulatory referral to Psychiatry -     AMB Referral to [facility] Coordinaton (ACO Patients) -     OLANZapine; Take 1 tablet (10 mg total) by mouth at bedtime.  Dispense: 30 tablet; Refill: 0  Self-care deficit for dressing and grooming -     AMB Referral to [facility] Coordinaton (ACO Patients)  Moderate episode of recurrent major depressive disorder (HCC) -     Ambulatory referral to Psychiatry -     AMB Referral to [facility] Coordinaton (ACO Patients) -     OLANZapine; Take 1 tablet (10 mg total) by mouth at bedtime.  Dispense: 30 tablet; Refill: 0  Urge incontinence of urine Assessment & Plan: Referral placed for urology.    Orders: -     Ambulatory referral to Urology     Follow up plan: Return in about 3 months (around 05/12/2023) for f/u kidneys and self care deficits.  Mort Sawyers, FNP

## 2023-02-09 NOTE — Assessment & Plan Note (Signed)
Referral placed for urology

## 2023-02-09 NOTE — Patient Instructions (Addendum)
   A referral was placed today for both psychiatry and also community care management.  Please let us know if you have not heard back within 2 weeks about the referral.   ------------------------------------  go here, has walk in for psychiatry.   [facility] located at [address], [place], [place] [address] = [phone]?      ------------------------------------  I have sent in your order electronically for the following: ultrasound of the kidneys  at this location below. Please call to schedule the appointment at your convenience  [facility] outpatient imaging center off [address], [place] [address] Phone [phone]  8-5 pm    ------------------------------------  Marina Gravel FNP-C

## 2023-02-09 NOTE — Progress Notes (Signed)
  Care Coordination  Outreach Note  02/09/2023 Name: Shelby Jordan MRN: [id number] DOB: [birth year]   Care Coordination Outreach Attempts: An unsuccessful telephone outreach was attempted today to offer the patient information about available care coordination services.  Follow Up Plan:  Additional outreach attempts will be made to offer the patient care coordination information and services.   Encounter Outcome:  No Answer  Burman Nieves, CCMA Care Coordination Care Guide Direct Dial: [phone]

## 2023-02-11 ENCOUNTER — Telehealth: Payer: Self-pay | Admitting: *Deleted

## 2023-02-11 ENCOUNTER — Ambulatory Visit: Payer: Self-pay | Admitting: *Deleted

## 2023-02-11 ENCOUNTER — Other Ambulatory Visit: Payer: Self-pay | Admitting: Radiology

## 2023-02-11 ENCOUNTER — Telehealth: Payer: Self-pay | Admitting: Family

## 2023-02-11 DIAGNOSIS — N3941 Urge incontinence: Secondary | ICD-10-CM

## 2023-02-11 DIAGNOSIS — R350 Frequency of micturition: Secondary | ICD-10-CM

## 2023-02-11 LAB — POC URINALSYSI DIPSTICK (AUTOMATED)
Bilirubin, UA: NEGATIVE
Blood, UA: NEGATIVE
Glucose, UA: NEGATIVE
Ketones, UA: NEGATIVE
Leukocytes, UA: NEGATIVE
Nitrite, UA: NEGATIVE
Protein, UA: POSITIVE — AB
Spec Grav, UA: 1.025 (ref 1.010–1.025)
Urobilinogen, UA: 0.2 E.U./dL
pH, UA: 6 (ref 5.0–8.0)

## 2023-02-11 NOTE — Patient Instructions (Signed)
Visit Information  Thank you for taking time to visit with me today. Please don't hesitate to contact me if I can be of assistance to you.   Following are the goals we discussed today:  This CSW will follow up with patient's daughter regarding resources for ongoing mental health program and Adult Day programs Patient's daughter to consider referral to the ACT team for patient   Our next appointment is by telephone on 02/18/23 at 1pm  Please call the care guide team at [phone] if you need to cancel or reschedule your appointment.   If you are experiencing a Mental Health or Behavioral Health Crisis or need someone to talk to, please call the Suicide and Crisis Lifeline: 988   Patient verbalizes understanding of instructions and care plan provided today and agrees to view in MyChart. Active MyChart status and patient understanding of how to access instructions and care plan via MyChart confirmed with patient.     Telephone follow up appointment with care management team member scheduled for: 02/18/23  Verna Czech, LCSW Clinical Social Worker  [facility] Care Management [phone]

## 2023-02-11 NOTE — Patient Outreach (Addendum)
  Care Coordination   02/11/2023 Name: Shelby Jordan MRN: [id number] DOB: 03-Sep-1950   Care Coordination Outreach Attempts:  An unsuccessful telephone outreach was attempted for a scheduled appointment today.  Follow Up Plan:  Additional outreach attempts will be made to offer the patient care coordination information and services.   Encounter Outcome:  Pt. Visit Completed   Care Coordination Interventions:  No, not indicated    Kayann Maj, LCSW Clinical Social Worker  [facility] Care Management [phone]

## 2023-02-11 NOTE — Telephone Encounter (Signed)
Patient's daughter returned call tot the office, is requesting a call back whenever possible.

## 2023-02-11 NOTE — Patient Outreach (Signed)
  Care Coordination   Initial Visit Note   02/11/2023 Name: Shelby Jordan MRN: [id number] DOB: [birth year]  Shelby Jordan is a 72 y.o. year old female who sees Mort Sawyers, FNP for primary care. I spoke with  Laelah Sites's daughter by phone today.  What matters to the patients health and wellness today?  Resources for ongoing mental health follow up and Adult Day Programs    Goals Addressed             This Visit's Progress    community resource needs       Interventions Today    Flowsheet Row Most Recent Value  Chronic Disease   Chronic disease during today's visit Other  [schizophrenia]  General Interventions   General Interventions Discussed/Reviewed General Interventions Discussed, Community Resources, Level of Care  Level of Care Personal Care Services, Adult Daycare  [Patient's daughter confirms that patient receives personal care services 3 hours per day, however she would like options for an Adult Day Care in increase socialization-CSW to research the [employer] as a possible option]  Mental Health Interventions   Mental Health Discussed/Reviewed Mental Health Discussed  [confirmed with daughter that patient is not receiving mental health follow up at this time -will need assistance to coordinate follow up-discussed possible referral to the ACT Team]              SDOH assessments and interventions completed:  Yes  SDOH Interventions Today    Flowsheet Row Most Recent Value  SDOH Interventions   Food Insecurity Interventions Intervention Not Indicated  Housing Interventions Intervention Not Indicated        Care Coordination Interventions:  Yes, provided   Follow up plan: Follow up call scheduled for 02/18/23    Encounter Outcome:  Pt. Visit Completed

## 2023-02-15 ENCOUNTER — Ambulatory Visit
Admission: RE | Admit: 2023-02-15 | Discharge: 2023-02-15 | Disposition: A | Discharge status: Home / Self Care | Payer: 59 | Source: Ambulatory Visit | Attending: Family | Admitting: Family

## 2023-02-15 DIAGNOSIS — R944 Abnormal results of kidney function studies: Secondary | ICD-10-CM | POA: Diagnosis not present

## 2023-02-15 DIAGNOSIS — R935 Abnormal findings on diagnostic imaging of other abdominal regions, including retroperitoneum: Secondary | ICD-10-CM | POA: Diagnosis not present

## 2023-02-15 NOTE — Progress Notes (Signed)
Urine was negative for UTI. Does pt see urology if no it is time.

## 2023-02-16 NOTE — Progress Notes (Signed)
Ultrasound of the kidneys is normal. There is a small simple cyst in the upper pole but this is not concerning. Cysts are common. No reason to suggest kidney change in function.

## 2023-02-18 ENCOUNTER — Ambulatory Visit: Payer: Self-pay | Admitting: *Deleted

## 2023-02-18 NOTE — Patient Outreach (Signed)
  Care Coordination   Follow Up Visit Note   02/18/2023 Name: Shelby Jordan MRN: [id number] DOB: 18-Apr-1951  Shelby Jordan is a 72 y.o. year old female who sees Mort Sawyers, FNP for primary care. I spoke with  Leafie Quashie's daughter by phone today.  What matters to the patients health and wellness today?  Per daughter, increasing patient's socialization     Goals Addressed             This Visit's Progress    Adult Day Treatment options       Interventions Today    Flowsheet Row Most Recent Value  Chronic Disease   Chronic disease during today's visit Other  [Bi-polar disorder]  General Interventions   General Interventions Discussed/Reviewed General Interventions Reviewed, [facility], Communication with  Communication with --  [facility] admission coordinator-[facility] referral completed -patient will need face to face assessment-daughter will confirm 8/13 at 1pm or 8/20 at 2pm tomorrow]  Level of Care Adult Daycare  [discussed referral to [facility] to increase community/social engagement-]  Mental Health Interventions   Mental Health Discussed/Reviewed Mental Health Reviewed  [patient's daughter to assist patient with presenting to [facility] during walk-in hours for initial appointment for ongoing mental health treatment]              SDOH assessments and interventions completed:  No     Care Coordination Interventions:  Yes, provided   Follow up plan: Follow up call scheduled for 02/19/23    Encounter Outcome:  Pt. Visit Completed

## 2023-02-18 NOTE — Patient Instructions (Signed)
Visit Information  Thank you for taking time to visit with me today. Please don't hesitate to contact me if I can be of assistance to you.   Following are the goals we discussed today:  Please review schedule to confirm date and time of assessment for the [facility]   Our next appointment is by telephone on 02/19/23 at 9:30am  Please call the care guide team at [phone] if you need to cancel or reschedule your appointment.   If you are experiencing a Mental Health or Behavioral Health Crisis or need someone to talk to, please call the Suicide and Crisis Lifeline: 988   Patient verbalizes understanding of instructions and care plan provided today and agrees to view in MyChart. Active MyChart status and patient understanding of how to access instructions and care plan via MyChart confirmed with patient.     Telephone follow up appointment with care management team member scheduled for: 02/19/23  Verna Czech, LCSW Clinical Social Worker  [facility] Care Management [phone]

## 2023-02-19 ENCOUNTER — Ambulatory Visit: Payer: Self-pay | Admitting: *Deleted

## 2023-02-19 NOTE — Patient Outreach (Signed)
  Care Coordination   Follow Up Visit Note   02/19/2023 Name: Shelby Jordan MRN: [id number] DOB: 12-29-1950  Shelby Jordan is a 72 y.o. year old female who sees Mort Sawyers, FNP for primary care. I spoke with  Shelby Jordan's daughter by phone today.  What matters to the patients health and wellness today?  Adult Day program options and resources for incontinent supplies    Goals Addressed             This Visit's Progress    Adult Day Treatment options       Interventions Today    Flowsheet Row Most Recent Value  Chronic Disease   Chronic disease during today's visit Other  [Bi-polar disorder]  General Interventions   General Interventions Discussed/Reviewed General Interventions Reviewed, Community Resources  Level of Care Adult Daycare  [Patient's daughter would like to visit the [facility] before making the decision to send patient. Address provided to set up visit]  Education Interventions   Education Provided Provided Education  Provided Verbal Education On [facility]  [Provided contact information for the [facility] incontinent supply program [phone]              SDOH assessments and interventions completed:  No     Care Coordination Interventions:  Yes, provided   Follow up plan: Follow up call scheduled for 03/12/23    Encounter Outcome:  Pt. Visit Completed

## 2023-02-19 NOTE — Patient Instructions (Signed)
Visit Information  Thank you for taking time to visit with me today. Please don't hesitate to contact me if I can be of assistance to you.   Following are the goals we discussed today:  Please contact/visit the [facility] [address] [place] [phone] Please contact [employer] [address] for incontinent supply resources  [phone]   Our next appointment is by telephone on 03/19/23 at 11am  Please call the care guide team at [phone] if you need to cancel or reschedule your appointment.   If you are experiencing a Mental Health or Behavioral Health Crisis or need someone to talk to, please call the Suicide and Crisis Lifeline: 988   Patient verbalizes understanding of instructions and care plan provided today and agrees to view in MyChart. Active MyChart status and patient understanding of how to access instructions and care plan via MyChart confirmed with patient.     Follow up with provider re: 03/19/23  Verna Czech, LCSW Clinical Social Worker  [facility] Care Management [phone]

## 2023-03-05 ENCOUNTER — Telehealth: Payer: Self-pay | Admitting: *Deleted

## 2023-03-05 NOTE — Patient Outreach (Signed)
  Care Coordination   Follow Up Visit Note   03/05/2023 Name: Shelby Jordan MRN: [id number] DOB: [birth year]  Shelby Jordan is a 72 y.o. year old female who sees Mort Sawyers, FNP for primary care. I spoke with  Llasmin Valadez's daughter by phone today.  What matters to the patients health and wellness today?  Resources for adult day programs and incontinent supplies. Per patient's daughter she will plan follow up with the [facility] next week. She did reach out to the [facility] for incontinent supplies, however they no longer provide this services.    Goals Addressed             This Visit's Progress    Adult Day Treatment options       Interventions Today    Flowsheet Row Most Recent Value  Chronic Disease   Chronic disease during today's visit Other  [Bi-polar disorder]  General Interventions   General Interventions Discussed/Reviewed General Interventions Discussed, General Interventions Reviewed, Community Resources  Level of Care Adult Daycare  [patient's daughter states that she has not been able to follow up with the [facility]- but will plan to follow up next week regarding possible participation.]  Mental Health Interventions   Mental Health Discussed/Reviewed Mental Health Discussed, Mental Health Reviewed  [patient's daughter confims that they completed a walk in appointment at [facility]-however they may be looking at additional options for ongoing mental health follow up]              SDOH assessments and interventions completed:  No     Care Coordination Interventions:  Yes, provided   Follow up plan: No further intervention required.   Encounter Outcome:  Pt. Visit Completed

## 2023-03-05 NOTE — Patient Instructions (Signed)
Visit Information  Thank you for taking time to visit with me today. Please don't hesitate to contact me if I can be of assistance to you.   Following are the goals we discussed today:  Please follow up with the [facility] and Mental Health Day/Evening Activity program [phone] Please follow up with [facility] for ongoing mental health follow up  If you are experiencing a Mental Health or Behavioral Health Crisis or need someone to talk to, please call the Suicide and Crisis Lifeline: 988   Patient verbalizes understanding of instructions and care plan provided today and agrees to view in MyChart. Active MyChart status and patient understanding of how to access instructions and care plan via MyChart confirmed with patient.     No further follow up required: patient to call this Child psychotherapist with any additional community resource needs  [employer], LCSW Clinical Social Worker  [facility] Care Management [phone]

## 2023-03-07 ENCOUNTER — Other Ambulatory Visit: Payer: Self-pay | Admitting: Family

## 2023-03-07 DIAGNOSIS — I10 Essential (primary) hypertension: Secondary | ICD-10-CM

## 2023-03-12 ENCOUNTER — Other Ambulatory Visit: Payer: Self-pay | Admitting: Family

## 2023-03-12 DIAGNOSIS — J4540 Moderate persistent asthma, uncomplicated: Secondary | ICD-10-CM

## 2023-03-12 DIAGNOSIS — R062 Wheezing: Secondary | ICD-10-CM

## 2023-03-19 ENCOUNTER — Encounter: Payer: Self-pay | Admitting: *Deleted

## 2023-04-07 ENCOUNTER — Other Ambulatory Visit: Payer: Self-pay | Admitting: Family

## 2023-04-07 DIAGNOSIS — F250 Schizoaffective disorder, bipolar type: Secondary | ICD-10-CM

## 2023-04-12 ENCOUNTER — Telehealth: Payer: Self-pay | Admitting: *Deleted

## 2023-04-13 NOTE — Patient Outreach (Signed)
Care Coordination   Follow Up Visit Note   04/13/2023 Late Entry Name: Lenea Chery MRN: [id number] DOB: 12/28/1950  Roqaya Kennemore is a 72 y.o. year old female who sees Mort Sawyers, FNP for primary care. I spoke with  Tuongvi Darco's daughter by phone on 04/12/23  What matters to the patients health and wellness today?  Phone call received from patient's daughter requesting confirmation of patient's follow up appointments. Transportation resource covered under patients insurance discussed as well as options for Adult Day Programs   Goals Addressed             This Visit's Progress    Adult Day Treatment options       Interventions Today    Flowsheet Row Most Recent Value  Chronic Disease   Chronic disease during today's visit Other  [Bi-polar disorder]  General Interventions   General Interventions Discussed/Reviewed General Interventions Discussed, [facility], Level of Care, Doctor Visits  Doctor Visits Discussed/Reviewed Doctor Visits Reviewed, Specialist, PCP  [facility] 04/15/23 Urologist 9/30]  Level of Care --  [Adult Day Program options discussed for daily structure and supervision-daughter decided against [employer] to consider [facility] [phone]]  Education Interventions   Education Provided Provided Education  Provided Verbal Education On [employer] covered by patient's insurance]              SDOH assessments and interventions completed:  No     Care Coordination Interventions:  Yes, provided   Follow up plan: No further intervention required.   Encounter Outcome:  Patient Visit Completed

## 2023-04-13 NOTE — Patient Instructions (Signed)
Visit Information  Thank you for taking time to visit with me today. Please don't hesitate to contact me if I can be of assistance to you.   Following are the goals we discussed today:  Please follow up with your PCP on 04/15/23 and Urologist on 04/19/23  Please contact your insurance company for transportation needs to medical appointment when needed Please contact [facility] [phone] to consider enrollment  If you are experiencing a Mental Health or Behavioral Health Crisis or need someone to talk to, please call 911   Patient verbalizes understanding of instructions and care plan provided today and agrees to view in MyChart. Active MyChart status and patient understanding of how to access instructions and care plan via MyChart confirmed with patient.     No further follow up required: patient's daughter to call this Child psychotherapist with any additional community resource needs   [employer], [employer] [place]  [facility], [facility] Licensed Clinical Social Geologist, engineering Dial: [phone]

## 2023-04-15 ENCOUNTER — Encounter: Payer: Self-pay | Admitting: Family

## 2023-04-15 ENCOUNTER — Ambulatory Visit: Payer: 59 | Admitting: Family

## 2023-04-15 ENCOUNTER — Ambulatory Visit (INDEPENDENT_AMBULATORY_CARE_PROVIDER_SITE_OTHER)
Admission: RE | Admit: 2023-04-15 | Discharge: 2023-04-15 | Disposition: A | Discharge status: Home / Self Care | Payer: 59 | Source: Ambulatory Visit | Attending: Family | Admitting: Family

## 2023-04-15 VITALS — BP 140/82 | HR 64 | Temp 97.3°F | Wt 207.6 lb

## 2023-04-15 DIAGNOSIS — R460 Very low level of personal hygiene: Secondary | ICD-10-CM | POA: Diagnosis not present

## 2023-04-15 DIAGNOSIS — Z741 Need for assistance with personal care: Secondary | ICD-10-CM | POA: Diagnosis not present

## 2023-04-15 DIAGNOSIS — R739 Hyperglycemia, unspecified: Secondary | ICD-10-CM | POA: Diagnosis not present

## 2023-04-15 DIAGNOSIS — I10 Essential (primary) hypertension: Secondary | ICD-10-CM

## 2023-04-15 DIAGNOSIS — R062 Wheezing: Secondary | ICD-10-CM

## 2023-04-15 DIAGNOSIS — F250 Schizoaffective disorder, bipolar type: Secondary | ICD-10-CM

## 2023-04-15 DIAGNOSIS — Z720 Tobacco use: Secondary | ICD-10-CM

## 2023-04-15 DIAGNOSIS — E669 Obesity, unspecified: Secondary | ICD-10-CM | POA: Diagnosis not present

## 2023-04-15 DIAGNOSIS — R5383 Other fatigue: Secondary | ICD-10-CM | POA: Diagnosis not present

## 2023-04-15 DIAGNOSIS — F3110 Bipolar disorder, current episode manic without psychotic features, unspecified: Secondary | ICD-10-CM

## 2023-04-15 LAB — COMPREHENSIVE METABOLIC PANEL
ALT: 12 U/L (ref 0–35)
AST: 15 U/L (ref 0–37)
Albumin: 4.5 g/dL (ref 3.5–5.2)
Alkaline Phosphatase: 41 U/L (ref 39–117)
BUN: 16 mg/dL (ref 6–23)
CO2: 31 mEq/L (ref 19–32)
Calcium: 9.6 mg/dL (ref 8.4–10.5)
Chloride: 103 mEq/L (ref 96–112)
Creatinine, Ser: 1.15 mg/dL (ref 0.40–1.20)
GFR: 47.84 mL/min — ABNORMAL LOW (ref >60.00)
Glucose, Bld: 109 mg/dL — ABNORMAL HIGH (ref 70–99)
Potassium: 5 mEq/L (ref 3.5–5.1)
Sodium: 141 mEq/L (ref 135–145)
Total Bilirubin: 0.5 mg/dL (ref 0.2–1.2)
Total Protein: 7.3 g/dL (ref 6.0–8.3)

## 2023-04-15 LAB — CBC WITH DIFFERENTIAL/PLATELET
Basophils Absolute: 0 10*3/uL (ref 0.0–0.1)
Basophils Relative: 0.5 % (ref 0.0–3.0)
Eosinophils Absolute: 0.1 10*3/uL (ref 0.0–0.7)
Eosinophils Relative: 1.6 % (ref 0.0–5.0)
HCT: 42.4 % (ref 36.0–46.0)
Hemoglobin: 13.7 g/dL (ref 12.0–15.0)
Lymphocytes Relative: 32.1 % (ref 12.0–46.0)
Lymphs Abs: 1.7 10*3/uL (ref 0.7–4.0)
MCHC: 32.3 g/dL (ref 30.0–36.0)
MCV: 90.1 fl (ref 78.0–100.0)
Monocytes Absolute: 0.3 10*3/uL (ref 0.1–1.0)
Monocytes Relative: 6 % (ref 3.0–12.0)
Neutro Abs: 3.2 10*3/uL (ref 1.4–7.7)
Neutrophils Relative %: 59.8 % (ref 43.0–77.0)
Platelets: 250 10*3/uL (ref 150.0–400.0)
RBC: 4.71 Mil/uL (ref 3.87–5.11)
RDW: 16.1 % — ABNORMAL HIGH (ref 11.5–15.5)
WBC: 5.3 10*3/uL (ref 4.0–10.5)

## 2023-04-15 LAB — VITAMIN B12: Vitamin B-12: 1356 pg/mL — ABNORMAL HIGH (ref 211–911)

## 2023-04-15 LAB — HEMOGLOBIN A1C: Hgb A1c MFr Bld: 6.1 % (ref 4.6–6.5)

## 2023-04-15 NOTE — Progress Notes (Signed)
Established Patient Office Visit  Subjective:   Patient ID: Shelby Jordan, female    DOB: 19-Sep-1950  Age: 72 y.o. MRN: [id number]  CC:  Chief Complaint  Patient presents with   Depression    HPI: Shelby Jordan is a 72 y.o. female presenting on 04/15/2023 for Depression  H/o tobacco abuse, back to smoking cigarettes about 1/2 pack a day. She does have COPD. Accompanied by daughter who states that she is overwhelmed being her caregiver. They have wixella inhaler for COPD but has not been able to figure out how to use it, but have been instructed by nurse and feel more comfortable to start now.   Urinary incontinence, every night almost peeing the bed. Daughter states it is part of her being 'lazy'.   Psychiatry, still not established with a psychiatry. Daughter states seems in a 'deep depression'. They state psychiatry does not reach out to them however they are working with [employer], SW to try to get established somewhere. I have placed multiple referrals and given daughter a list of psychiatrists to call but unsuccessful, non compliant as well. Not drinking water often, daughter trying to get her to drink more. Daughter states very unmotivated. Not bathing or grooming poor hygiene. Daughter states it might be difficult as well at home because daughter fights often with grand daughter at home, and pt might be bothered due to the hostility at home.       ROS: Negative unless specifically indicated above in HPI.   Relevant past medical history reviewed and updated as indicated.   Allergies and medications reviewed and updated.   Current Outpatient Medications:    amLODipine (NORVASC) 5 MG tablet, Take 1 tablet (5 mg total) by mouth daily., Disp: 90 tablet, Rfl: 3   Blood Pressure Monitoring (BLOOD PRESSURE CUFF) MISC, 1 each by Does not apply route daily., Disp: 1 each, Rfl: 0   divalproex (DEPAKOTE) 500 MG DR tablet, Take 1 tablet (500 mg total) by mouth 2 (two) times daily.,  Disp: 60 tablet, Rfl: 0   magnesium gluconate (MAGONATE) 500 MG tablet, Take 500 mg by mouth at bedtime., Disp: , Rfl:    metoprolol succinate (TOPROL-XL) 50 MG 24 hr tablet, TAKE 1 TABLET BY MOUTH EVERY DAY, Disp: 90 tablet, Rfl: 1   OLANZapine (ZYPREXA) 10 MG tablet, Take 1 tablet (10 mg total) by mouth at bedtime., Disp: 30 tablet, Rfl: 0   potassium chloride (KLOR-CON M) 10 MEQ tablet, Take 1 tablet (10 mEq total) by mouth daily., Disp: 30 tablet, Rfl: 3   traZODone (DESYREL) 50 MG tablet, Take 1 tablet (50 mg total) by mouth at bedtime., Disp: 30 tablet, Rfl: 3   albuterol (VENTOLIN HFA) 108 (90 Base) MCG/ACT inhaler, Inhale 2 puffs into the lungs every 4 (four) hours as needed for shortness of breath., Disp: 8.5 each, Rfl: 0   Vibegron (GEMTESA) 75 MG TABS, Take 1 tablet (75 mg total) by mouth daily., Disp: , Rfl:    WIXELA INHUB 100-50 MCG/ACT AEPB, INHALE 1 PUFF INTO THE LUNGS TWICE A DAY, Disp: 60 each, Rfl: 3  Allergies  Allergen Reactions   Tomato (Diagnostic) Other (See Comments)    Tongue break out.    Citrus Rash    Objective:   BP (!) 140/82 (BP Location: Left Arm, Patient Position: Sitting, Cuff Size: Normal)   Pulse 64   Temp (!) 97.3 F (36.3 C) (Temporal)   Wt 207 lb 9.6 oz (94.2 kg)   SpO2 100%  BMI 35.63 kg/m    Physical Exam Constitutional:      General: She is not in acute distress.    Appearance: Normal appearance. She is normal weight. She is not ill-appearing, toxic-appearing or diaphoretic.  HENT:     Head: Normocephalic.  Cardiovascular:     Rate and Rhythm: Normal rate and regular rhythm.  Pulmonary:     Effort: Pulmonary effort is normal.     Breath sounds: Decreased air movement present. Examination of the right-upper field reveals wheezing. Examination of the left-upper field reveals wheezing. Wheezing present.  Musculoskeletal:        General: Normal range of motion.  Neurological:     General: No focal deficit present.     Mental Status:  She is alert and oriented to person, place, and time. Mental status is at baseline.  Psychiatric:        Attention and Perception: Attention normal. She does not perceive auditory or visual hallucinations.        Mood and Affect: Mood is depressed and elated.        Speech: Speech normal.        Behavior: Behavior is withdrawn. Behavior is cooperative.        Thought Content: Thought content normal. Thought content does not include homicidal or suicidal plan.        Cognition and Memory: Cognition and memory normal. Cognition is not impaired.     Assessment & Plan:  Tobacco abuse Assessment & Plan: Smoking cessation instruction/counseling given:  counseled patient on the dangers of tobacco use, advised patient to stop smoking, and reviewed strategies to maximize success   Orders: -     DG Chest 2 View; Future  Poor personal hygiene  Obesity (BMI 35.0-39.9 without comorbidity)  Schizoaffective disorder, bipolar type (HCC)  Self-care deficit for dressing and grooming  Bipolar I disorder, most recent episode (or current) manic (HCC) Assessment & Plan: They continue to be without a psychiatrist to take over management of medication. This has been discussed on several visits. Pt needs to see a psychiatrist as I also feel that medication is not sufficient enough for her ongoing symptoms. I have given them again a list of available psychiatrist offices as well as urgent walk in locations.   I have discussed I can provide one more refill but as her current status is not improving, she will need to see a psychiatry for ongoing care.      Other fatigue -     CBC with Differential/Platelet -     Vitamin [name] -     Comprehensive metabolic panel  Primary hypertension  Hyperglycemia -     Hemoglobin A1c  Wheezing Assessment & Plan: Cxr today along with fatigue ddx pneumonia PE  Orders: -     DG Chest 2 View; Future     Follow up plan: Return in about 3 months (around  07/15/2023) for f/u CPE.  Mort Sawyers, FNP

## 2023-04-15 NOTE — Patient Instructions (Addendum)
   [facility] Phone # [phone] They have locations in [place], [place], [place], and [place]. They do take Healthy [employer] I know for sure and the [employer].   [address] [phone]  [address] [place], [place], [place] [address]    [facility] [employer] [address] [place], [place] [address]   [phone]   [facility]- [phone]   [employer] [phone]   [employer]- [phone]   If at any time you feel your needs are more urgent or you are concerned for your well being please see the below options:  For walk in options for mental health:    [facility] [address] [place], [id number] Tel: [phone]  [facility] [address] in [place], [place]. Call our 24-hour HelpLine at [phone] or [phone] for immediate assistance for mental health and substance abuse issues.  And or walk into [facility] hospital ER.   [employer] [facility]: 1-800-SUICIDE  [employer] Suicide Prevention Lifeline: 1-800-273-TALK  Regards,   Mort Sawyers FNP-C

## 2023-04-15 NOTE — Progress Notes (Signed)
Negative chest x ray.

## 2023-04-19 ENCOUNTER — Encounter: Payer: Self-pay | Admitting: Urology

## 2023-04-19 ENCOUNTER — Ambulatory Visit (INDEPENDENT_AMBULATORY_CARE_PROVIDER_SITE_OTHER): Payer: 59 | Admitting: Urology

## 2023-04-19 VITALS — Ht 64.0 in | Wt 206.0 lb

## 2023-04-19 DIAGNOSIS — R460 Very low level of personal hygiene: Secondary | ICD-10-CM | POA: Insufficient documentation

## 2023-04-19 DIAGNOSIS — R062 Wheezing: Secondary | ICD-10-CM | POA: Insufficient documentation

## 2023-04-19 DIAGNOSIS — N3941 Urge incontinence: Secondary | ICD-10-CM | POA: Diagnosis not present

## 2023-04-19 DIAGNOSIS — R32 Unspecified urinary incontinence: Secondary | ICD-10-CM

## 2023-04-19 MED ORDER — GEMTESA 75 MG PO TABS: 1.0000 | ORAL_TABLET | Freq: Every day | ORAL | Status: DC

## 2023-04-19 NOTE — Assessment & Plan Note (Addendum)
They continue to be without a psychiatrist to take over management of medication. This has been discussed on several visits. Pt needs to see a psychiatrist as I also feel that medication is not sufficient enough for her ongoing symptoms. I have given them again a list of available psychiatrist offices as well as urgent walk in locations.   I have discussed I can provide one more refill but as her current status is not improving, she will need to see a psychiatry for ongoing care.

## 2023-04-19 NOTE — Assessment & Plan Note (Signed)
Smoking cessation instruction/counseling given:  counseled patient on the dangers of tobacco use, advised patient to stop smoking, and reviewed strategies to maximize success 

## 2023-04-19 NOTE — Patient Instructions (Signed)

## 2023-04-19 NOTE — Progress Notes (Signed)
04/19/2023 3:21 PM   Shelby Jordan [birth year] [id number]  Referring provider: Mort Sawyers, FNP [address] [place],  [place] [address]  Chief Complaint  Patient presents with   New Patient (Initial Visit)   Urinary Incontinence    HPI: I was consulted to assess the patient's urinary incontinence.  She has urge incontinence.  She says she has no stress incontinence.  She has moderately severe bedwetting.  She does not wear pads  She voids every hour and cannot hold it for 2 hours.  She gets up 5 times at night.  Does not take a diuretic.  No ankle edema  No hysterectomy  History was a little bit challenging.  I reviewed the medical records and she has had a recent renal ultrasound and negative urine culture  No neurologic issues.  No previous treatment.  No history of kidney stones bladder surgery or bladder infections   PMH: Past Medical History:  Diagnosis Date   Bipolar 1 disorder (HCC)    Depression    Hepatitis C    High cholesterol    Hypertension    UTI (urinary tract infection)     Surgical History: Past Surgical History:  Procedure Laterality Date   APPENDECTOMY     BREAST BIOPSY Right 03/04/2022   stereo bx, mass "X" clip-path pending   TOE AMPUTATION      Home Medications:  Allergies as of 04/19/2023       Reactions   Tomato (diagnostic) Other (See Comments)   Tongue break out.    Citrus Rash        Medication List        Accurate as of April 19, 2023  3:21 PM. If you have any questions, ask your nurse or doctor.          albuterol 108 (90 Base) MCG/ACT inhaler Commonly known as: VENTOLIN HFA Inhale 2 puffs into the lungs every 4 (four) hours as needed for shortness of breath.   amLODipine 5 MG tablet Commonly known as: NORVASC Take 1 tablet (5 mg total) by mouth daily.   Blood Pressure Cuff Misc 1 each by Does not apply route daily.   divalproex 500 MG DR tablet Commonly known as: DEPAKOTE Take 1 tablet (500  mg total) by mouth 2 (two) times daily.   magnesium gluconate 500 MG tablet Commonly known as: MAGONATE Take 500 mg by mouth at bedtime.   metoprolol succinate 50 MG 24 hr tablet Commonly known as: TOPROL-XL TAKE 1 TABLET BY MOUTH EVERY DAY   OLANZapine 10 MG tablet Commonly known as: ZYPREXA Take 1 tablet (10 mg total) by mouth at bedtime.   potassium chloride 10 MEQ tablet Commonly known as: KLOR-CON M Take 1 tablet (10 mEq total) by mouth daily.   traZODone 50 MG tablet Commonly known as: DESYREL Take 1 tablet (50 mg total) by mouth at bedtime.   Wixela Inhub 100-50 MCG/ACT Aepb Generic drug: fluticasone-salmeterol INHALE 1 PUFF INTO THE LUNGS TWICE A DAY        Allergies:  Allergies  Allergen Reactions   Tomato (Diagnostic) Other (See Comments)    Tongue break out.    Citrus Rash    Family History: Family History  Problem Relation Age of Onset   Mental illness Mother        born in mental hospital   Breast cancer Neg Hx     Social History:  reports that she quit smoking about 20 months ago. Her smoking  use included cigarettes. She started smoking about 31 years ago. She has a 30 pack-year smoking history. She has been exposed to tobacco smoke. She has never used smokeless tobacco. She reports that she does not drink alcohol and does not use drugs.  ROS:                                        Physical Exam: There were no vitals taken for this visit.  Constitutional:  Alert and oriented, No acute distress. HEENT: [place] AT, moist mucus membranes.  Trachea midline, no masses.  Laboratory Data: Lab Results  Component Value Date   WBC 5.3 04/15/2023   HGB 13.7 04/15/2023   HCT 42.4 04/15/2023   MCV 90.1 04/15/2023   PLT 250.0 04/15/2023    Lab Results  Component Value Date   CREATININE 1.15 04/15/2023    No results found for: "PSA"  No results found for: "TESTOSTERONE"  Lab Results  Component Value Date   HGBA1C 6.1  04/15/2023    Urinalysis    Component Value Date/Time   COLORURINE YELLOW 12/11/2022 1612   APPEARANCEUR CLEAR 12/11/2022 1612   LABSPEC 1.019 12/11/2022 1612   PHURINE 6.0 12/11/2022 1612   GLUCOSEU NEGATIVE 12/11/2022 1612   GLUCOSEU NEGATIVE 07/16/2022 1459   HGBUR NEGATIVE 12/11/2022 1612   BILIRUBINUR neg 02/11/2023 1440   KETONESUR NEGATIVE 12/11/2022 1612   PROTEINUR Positive (A) 02/11/2023 1440   PROTEINUR 1+ (A) 12/11/2022 1612   UROBILINOGEN 0.2 02/11/2023 1440   UROBILINOGEN 0.2 07/16/2022 1459   NITRITE neg 02/11/2023 1440   NITRITE NEGATIVE 07/16/2022 1459   LEUKOCYTESUR Negative 02/11/2023 1440   LEUKOCYTESUR NEGATIVE 07/16/2022 1459    Pertinent Imaging: No urine obtained.  Chart reviewed  Assessment & Plan: Has urgency incontinence bedwetting and significant nocturia reassess in 6 weeks with above examination and cystoscopy on Gemtesa samples and prescription.  She may or may not need urodynamics in the future.  Get urine culture next time.  Check postvoid residual next time  1. Urinary incontinence, unspecified type  - Urinalysis, Complete   No follow-ups on file.  Martina Sinner, MD  [facility] [address], [address] [place], [place] [address] [phone]

## 2023-04-19 NOTE — Progress Notes (Addendum)
[name] much too high. If taking b12 I suggest we stop.  Kidney function stable.  A1c stable.

## 2023-04-19 NOTE — Assessment & Plan Note (Signed)
Cxr today along with fatigue ddx pneumonia PE

## 2023-04-21 ENCOUNTER — Other Ambulatory Visit: Payer: Self-pay | Admitting: Family

## 2023-04-21 DIAGNOSIS — F250 Schizoaffective disorder, bipolar type: Secondary | ICD-10-CM

## 2023-04-22 NOTE — Telephone Encounter (Signed)
For depakote and other psych meds, will give thirty day supply only but then pt will need to see psychiatry for all future refills.

## 2023-05-21 ENCOUNTER — Other Ambulatory Visit: Payer: Self-pay | Admitting: Family

## 2023-05-21 DIAGNOSIS — F250 Schizoaffective disorder, bipolar type: Secondary | ICD-10-CM

## 2023-05-25 ENCOUNTER — Other Ambulatory Visit: Payer: Self-pay | Admitting: Family

## 2023-05-25 DIAGNOSIS — F3110 Bipolar disorder, current episode manic without psychotic features, unspecified: Secondary | ICD-10-CM

## 2023-05-25 DIAGNOSIS — F331 Major depressive disorder, recurrent, moderate: Secondary | ICD-10-CM

## 2023-05-25 DIAGNOSIS — F250 Schizoaffective disorder, bipolar type: Secondary | ICD-10-CM

## 2023-06-07 ENCOUNTER — Encounter: Payer: Self-pay | Admitting: Urology

## 2023-06-07 ENCOUNTER — Ambulatory Visit: Payer: 59 | Admitting: Urology

## 2023-06-07 VITALS — BP 141/82 | HR 80

## 2023-06-07 DIAGNOSIS — R32 Unspecified urinary incontinence: Secondary | ICD-10-CM

## 2023-06-07 LAB — URINALYSIS, COMPLETE
Bilirubin, UA: NEGATIVE
Glucose, UA: NEGATIVE
Ketones, UA: NEGATIVE
Leukocytes,UA: NEGATIVE
Nitrite, UA: NEGATIVE
Protein,UA: NEGATIVE
Specific Gravity, UA: 1.02 (ref 1.005–1.030)
Urobilinogen, Ur: 0.2 mg/dL (ref 0.2–1.0)
pH, UA: 6.5 (ref 5.0–7.5)

## 2023-06-07 LAB — MICROSCOPIC EXAMINATION

## 2023-06-07 NOTE — Progress Notes (Signed)
06/07/2023 1:29 PM   Shelby Jordan 09/10/1950 [id number]  Referring provider: Mort Sawyers, FNP [address] [place],  [place] [address]  Chief Complaint  Patient presents with   Cysto    HPI: I was consulted to assess the patient's urinary incontinence.  She has urge incontinence.  She says she has no stress incontinence.  She has moderately severe bedwetting.  She does not wear pads   She voids every hour and cannot hold it for 2 hours.  She gets up 5 times at night.  Does not take a diuretic.  No ankle edema   No hysterectomy   History was a little bit challenging.  I reviewed the medical records and she has had a recent renal ultrasound and negative urine culture    Has urgency incontinence bedwetting and significant nocturia reassess in 6 weeks with examination and cystoscopy on Gemtesa samples and prescription. She may or may not need urodynamics in the future. Get urine culture next time. Check postvoid residual next time   Today Frequency improved.  Much less bedwetting but still some.  Less urge incontinence.  Clinically not infected Pelvic examination she had grade 1 hypermobility the bladder neck and negative cough test with a moderate cough after cystoscopy Cystoscopy: Patient underwent flexible cystoscopy.  Bladder mucosa and trigone were normal.  No cystitis.  No carcinoma.  Urine aspirated and sent for culture   PMH: Past Medical History:  Diagnosis Date   Bipolar 1 disorder (HCC)    Depression    Hepatitis C    High cholesterol    Hypertension    UTI (urinary tract infection)     Surgical History: Past Surgical History:  Procedure Laterality Date   APPENDECTOMY     BREAST BIOPSY Right 03/04/2022   stereo bx, mass "X" clip-path pending   TOE AMPUTATION      Home Medications:  Allergies as of 06/07/2023       Reactions   Tomato (diagnostic) Other (See Comments)   Tongue break out.    Citrus Rash        Medication List         Accurate as of June 07, 2023  1:29 PM. If you have any questions, ask your nurse or doctor.          albuterol 108 (90 Base) MCG/ACT inhaler Commonly known as: VENTOLIN HFA Inhale 2 puffs into the lungs every 4 (four) hours as needed for shortness of breath.   amLODipine 5 MG tablet Commonly known as: NORVASC Take 1 tablet (5 mg total) by mouth daily.   Blood Pressure Cuff Misc 1 each by Does not apply route daily.   divalproex 500 MG DR tablet Commonly known as: DEPAKOTE TAKE 1 TABLET BY MOUTH TWICE A DAY   Gemtesa 75 MG Tabs Generic drug: Vibegron Take 1 tablet (75 mg total) by mouth daily.   magnesium gluconate 500 MG tablet Commonly known as: MAGONATE Take 500 mg by mouth at bedtime.   metoprolol succinate 50 MG 24 hr tablet Commonly known as: TOPROL-XL TAKE 1 TABLET BY MOUTH EVERY DAY   OLANZapine 10 MG tablet Commonly known as: ZYPREXA Take 1 tablet (10 mg total) by mouth at bedtime.   potassium chloride 10 MEQ tablet Commonly known as: KLOR-CON M Take 1 tablet (10 mEq total) by mouth daily.   traZODone 50 MG tablet Commonly known as: DESYREL Take 1 tablet (50 mg total) by mouth at bedtime.   Wixela Inhub  100-50 MCG/ACT Aepb Generic drug: fluticasone-salmeterol INHALE 1 PUFF INTO THE LUNGS TWICE A DAY        Allergies:  Allergies  Allergen Reactions   Tomato (Diagnostic) Other (See Comments)    Tongue break out.    Citrus Rash    Family History: Family History  Problem Relation Age of Onset   Mental illness Mother        born in mental hospital   Breast cancer Neg Hx     Social History:  reports that she quit smoking about 22 months ago. Her smoking use included cigarettes. She started smoking about 31 years ago. She has a 30 pack-year smoking history. She has been exposed to tobacco smoke. She has never used smokeless tobacco. She reports that she does not drink alcohol and does not use drugs.  ROS:                                         Physical Exam: BP (!) 141/82   Pulse 80   Constitutional:  Alert and oriented, No acute distress.   Laboratory Data: Lab Results  Component Value Date   WBC 5.3 04/15/2023   HGB 13.7 04/15/2023   HCT 42.4 04/15/2023   MCV 90.1 04/15/2023   PLT 250.0 04/15/2023    Lab Results  Component Value Date   CREATININE 1.15 04/15/2023    No results found for: "PSA"  No results found for: "TESTOSTERONE"  Lab Results  Component Value Date   HGBA1C 6.1 04/15/2023    Urinalysis    Component Value Date/Time   COLORURINE YELLOW 12/11/2022 1612   APPEARANCEUR CLEAR 12/11/2022 1612   LABSPEC 1.019 12/11/2022 1612   PHURINE 6.0 12/11/2022 1612   GLUCOSEU NEGATIVE 12/11/2022 1612   GLUCOSEU NEGATIVE 07/16/2022 1459   HGBUR NEGATIVE 12/11/2022 1612   BILIRUBINUR neg 02/11/2023 1440   KETONESUR NEGATIVE 12/11/2022 1612   PROTEINUR Positive (A) 02/11/2023 1440   PROTEINUR 1+ (A) 12/11/2022 1612   UROBILINOGEN 0.2 02/11/2023 1440   UROBILINOGEN 0.2 07/16/2022 1459   NITRITE neg 02/11/2023 1440   NITRITE NEGATIVE 07/16/2022 1459   LEUKOCYTESUR Negative 02/11/2023 1440   LEUKOCYTESUR NEGATIVE 07/16/2022 1459    Pertinent Imaging: Urine reviewed and sent for culture.   Assessment & Plan: Patient doing moderately better on Gemtesa.  Recheck in 2 months with a postvoid residual and proceed accordingly.  Call if urine culture positive  1. Urinary incontinence, unspecified type  - Urinalysis, Complete   No follow-ups on file.  Martina Sinner, MD  [facility] [address], [address] [place], [place] [address] [phone]

## 2023-06-13 LAB — CULTURE, URINE COMPREHENSIVE

## 2023-07-03 ENCOUNTER — Other Ambulatory Visit: Payer: Self-pay | Admitting: Family

## 2023-07-03 DIAGNOSIS — F331 Major depressive disorder, recurrent, moderate: Secondary | ICD-10-CM

## 2023-07-03 DIAGNOSIS — F250 Schizoaffective disorder, bipolar type: Secondary | ICD-10-CM

## 2023-07-03 DIAGNOSIS — F3110 Bipolar disorder, current episode manic without psychotic features, unspecified: Secondary | ICD-10-CM

## 2023-07-12 ENCOUNTER — Telehealth: Payer: Self-pay | Admitting: *Deleted

## 2023-07-12 NOTE — Telephone Encounter (Signed)
Message was relayed to pt by [employer] representative.

## 2023-07-12 NOTE — Telephone Encounter (Signed)
Copied from CRM [id number]. Topic: Clinical - Medication Refill >> Jul 12, 2023 11:22 AM [name] wrote: Most Recent Primary Care Visit:  Provider: Mort Sawyers  Department: [facility]  Visit Type: PHYSICAL  Date: 04/15/2023  Medication:  divalproex (DEPAKOTE) 500 MG DR tablet   Has the patient contacted their pharmacy? Yes (Agent: If no, request that the patient contact the pharmacy for the refill. If patient does not wish to contact the pharmacy document the reason why and proceed with request.) (Agent: If yes, when and what did the pharmacy advise?)Pharmacy stated the provider would need to be called   Is this the correct pharmacy for this prescription? Yes If no, delete pharmacy and type the correct one.  This is the patient's preferred pharmacy:  [facility] [phone] [facility], [place] - [address] [place] [address] Phone: [phone] Fax: [phone]   Has the prescription been filled recently? Yes  Is the patient out of the medication? Yes  Has the patient been seen for an appointment in the last year OR does the patient have an upcoming appointment? Yes  Can we respond through MyChart? No  Agent: Please be advised that Rx refills may take up to 3 business days. We ask that you follow-up with your pharmacy.

## 2023-07-12 NOTE — Telephone Encounter (Signed)
Error

## 2023-07-12 NOTE — Telephone Encounter (Signed)
This medication to be filled by psychiatry per Tabitha. LM for pt to return call.

## 2023-07-22 ENCOUNTER — Ambulatory Visit: Payer: 59 | Admitting: Family

## 2023-07-23 ENCOUNTER — Encounter: Payer: Self-pay | Admitting: Family

## 2023-08-07 ENCOUNTER — Emergency Department: Payer: 59

## 2023-08-07 ENCOUNTER — Emergency Department
Admission: EM | Admit: 2023-08-07 | Discharge: 2023-08-07 | Disposition: A | Discharge status: Home / Self Care | Payer: 59 | Attending: Emergency Medicine | Admitting: Emergency Medicine

## 2023-08-07 DIAGNOSIS — S0081XA Abrasion of other part of head, initial encounter: Secondary | ICD-10-CM | POA: Insufficient documentation

## 2023-08-07 DIAGNOSIS — S0990XA Unspecified injury of head, initial encounter: Secondary | ICD-10-CM | POA: Diagnosis not present

## 2023-08-07 DIAGNOSIS — S060X0A Concussion without loss of consciousness, initial encounter: Secondary | ICD-10-CM | POA: Diagnosis not present

## 2023-08-07 DIAGNOSIS — W19XXXA Unspecified fall, initial encounter: Secondary | ICD-10-CM | POA: Diagnosis not present

## 2023-08-07 DIAGNOSIS — I6782 Cerebral ischemia: Secondary | ICD-10-CM | POA: Diagnosis not present

## 2023-08-07 DIAGNOSIS — S199XXA Unspecified injury of neck, initial encounter: Secondary | ICD-10-CM | POA: Diagnosis not present

## 2023-08-07 DIAGNOSIS — I10 Essential (primary) hypertension: Secondary | ICD-10-CM | POA: Diagnosis not present

## 2023-08-07 DIAGNOSIS — I672 Cerebral atherosclerosis: Secondary | ICD-10-CM | POA: Diagnosis not present

## 2023-08-07 LAB — BASIC METABOLIC PANEL
Anion gap: 11 (ref 5–15)
BUN: 14 mg/dL (ref 8–23)
CO2: 25 mmol/L (ref 22–32)
Calcium: 9.8 mg/dL (ref 8.9–10.3)
Chloride: 103 mmol/L (ref 98–111)
Creatinine, Ser: 1.13 mg/dL — ABNORMAL HIGH (ref 0.44–1.00)
GFR, Estimated: 52 mL/min — ABNORMAL LOW (ref >60)
Glucose, Bld: 111 mg/dL — ABNORMAL HIGH (ref 70–99)
Potassium: 4.1 mmol/L (ref 3.5–5.1)
Sodium: 139 mmol/L (ref 135–145)

## 2023-08-07 LAB — CBC WITH DIFFERENTIAL/PLATELET
Abs Immature Granulocytes: 0.01 10*3/uL (ref 0.00–0.07)
Basophils Absolute: 0 10*3/uL (ref 0.0–0.1)
Basophils Relative: 1 %
Eosinophils Absolute: 0.1 10*3/uL (ref 0.0–0.5)
Eosinophils Relative: 1 %
HCT: 40.9 % (ref 36.0–46.0)
Hemoglobin: 13.6 g/dL (ref 12.0–15.0)
Immature Granulocytes: 0 %
Lymphocytes Relative: 20 %
Lymphs Abs: 1.3 10*3/uL (ref 0.7–4.0)
MCH: 28.9 pg (ref 26.0–34.0)
MCHC: 33.3 g/dL (ref 30.0–36.0)
MCV: 86.8 fL (ref 80.0–100.0)
Monocytes Absolute: 0.3 10*3/uL (ref 0.1–1.0)
Monocytes Relative: 4 %
Neutro Abs: 5 10*3/uL (ref 1.7–7.7)
Neutrophils Relative %: 74 %
Platelets: 284 10*3/uL (ref 150–400)
RBC: 4.71 MIL/uL (ref 3.87–5.11)
RDW: 14.6 % (ref 11.5–15.5)
WBC: 6.7 10*3/uL (ref 4.0–10.5)
nRBC: 0 % (ref 0.0–0.2)

## 2023-08-07 MED ORDER — AMLODIPINE BESYLATE 5 MG PO TABS
5.0000 mg | ORAL_TABLET | Freq: Once | ORAL | Status: AC
  Administered 2023-08-07: 5 mg via ORAL
  Filled 2023-08-07: qty 1

## 2023-08-07 MED ORDER — TRAZODONE HCL 50 MG PO TABS: 50.0000 mg | ORAL_TABLET | Freq: Every day | ORAL | 0 refills | Status: DC

## 2023-08-07 MED ORDER — TRAZODONE HCL 50 MG PO TABS
50.0000 mg | ORAL_TABLET | Freq: Once | ORAL | Status: AC
  Administered 2023-08-07: 50 mg via ORAL
  Filled 2023-08-07: qty 1

## 2023-08-07 MED ORDER — OLANZAPINE 10 MG PO TABS: 10.0000 mg | ORAL_TABLET | Freq: Every day | ORAL | 0 refills | Status: DC

## 2023-08-07 MED ORDER — AMLODIPINE BESYLATE 5 MG PO TABS: 5.0000 mg | ORAL_TABLET | Freq: Every day | ORAL | 0 refills | Status: DC

## 2023-08-07 MED ORDER — ACETAMINOPHEN 500 MG PO TABS
1000.0000 mg | ORAL_TABLET | Freq: Once | ORAL | Status: AC
  Administered 2023-08-07: 1000 mg via ORAL
  Filled 2023-08-07: qty 2

## 2023-08-07 MED ORDER — OLANZAPINE 5 MG PO TBDP
10.0000 mg | ORAL_TABLET | Freq: Once | ORAL | Status: AC
  Administered 2023-08-07: 10 mg via ORAL
  Filled 2023-08-07: qty 2

## 2023-08-07 MED ORDER — DIVALPROEX SODIUM 500 MG PO DR TAB
500.0000 mg | DELAYED_RELEASE_TABLET | Freq: Once | ORAL | Status: AC
Start: 2023-08-07 — End: 2023-08-07
  Administered 2023-08-07: 500 mg via ORAL
  Filled 2023-08-07: qty 1

## 2023-08-07 MED ORDER — DIVALPROEX SODIUM 500 MG PO DR TAB: 500.0000 mg | DELAYED_RELEASE_TABLET | Freq: Two times a day (BID) | ORAL | 0 refills | Status: DC

## 2023-08-07 NOTE — ED Provider Notes (Signed)
[facility] Provider Note    Event Date/Time   First MD Initiated Contact with Patient 08/07/23 2245     (approximate)   History   Head Injury   HPI  Shelby Jordan is a 73 y.o. female who presents to the ED for evaluation of Head Injury   Review of PCP visit from September.  History of bipolar disorder, schizoaffective disorder.   Patient presents with a daughter and granddaughter for evaluation of a fall and head injury.  They report that patient has been without her prescription medications for at least 3 weeks.  She mechanical fall this evening, slipping on some ice and slamming her forehead on concrete.  She immediately cried out, no witnessed syncopal episode or seizure activity.  Here in the ED, they wait less than an hour for a room.  While waiting they are calling out to triage nursing that patient might have a seizure or stroke and patient was rushed back to a room.  I do not actually visualize any seizure activity.  When she is brought back to a room she reports feeling fine and has no complaints.   Physical Exam   Triage Vital Signs: ED Triage Vitals  Encounter Vitals Group     BP 08/07/23 2145 (!) 176/78     Systolic BP Percentile --      Diastolic BP Percentile --      Pulse Rate 08/07/23 2143 75     Resp 08/07/23 2143 18     Temp 08/07/23 2143 98.4 F (36.9 C)     Temp Source 08/07/23 2143 Oral     SpO2 08/07/23 2143 97 %     Weight 08/07/23 2145 208 lb (94.3 kg)     Height 08/07/23 2145 5' 4.5" (1.638 m)     Head Circumference --      Peak Flow --      Pain Score 08/07/23 2144 4     Pain Loc --      Pain Education --      Exclude from Growth Chart --     Most recent vital signs: Vitals:   08/07/23 2257 08/07/23 2300  BP: (!) 207/71 (!) 199/90  Pulse:  67  Resp:  15  Temp:    SpO2:  99%    General: Awake, no distress.  CV:  Good peripheral perfusion.  Resp:  Normal effort.  Abd:  No distention.  MSK:  No  deformity noted.  Neuro:  No focal deficits appreciated. Cranial nerves II through XII intact 5/5 strength and sensation in all 4 extremities Other:  Abrasion to the forehead without laceration   ED Results / Procedures / Treatments   Labs (all labs ordered are listed, but only abnormal results are displayed) Labs Reviewed  BASIC METABOLIC PANEL - Abnormal; Notable for the following components:      Result Value   Glucose, Bld 111 (*)    Creatinine, Ser 1.13 (*)    GFR, Estimated 52 (*)    All other components within normal limits  CBC WITH DIFFERENTIAL/PLATELET    EKG   RADIOLOGY CT head interpreted by me without evidence of acute intracranial pathology CT cervical spine interpreted by me without evidence of fracture or dislocation  Official radiology report(s): CT HEAD WO CONTRAST ( ) Result Date: 08/07/2023 CLINICAL DATA:  Mechanical fall.  Laceration to middle of forehead. EXAM: CT HEAD WITHOUT CONTRAST CT CERVICAL SPINE WITHOUT CONTRAST TECHNIQUE: Multidetector CT imaging of the head and  cervical spine was performed following the standard protocol without intravenous contrast. Multiplanar CT image reconstructions of the cervical spine were also generated. RADIATION DOSE REDUCTION: This exam was performed according to the departmental dose-optimization program which includes automated exposure control, adjustment of the mA and/or kV according to patient size and/or use of iterative reconstruction technique. COMPARISON:  None Available. FINDINGS: CT HEAD FINDINGS Brain: No intracranial hemorrhage, mass effect, or evidence of acute infarct. No hydrocephalus. No extra-axial fluid collection. Mild cerebral atrophy and chronic small vessel ischemic disease. 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 Vascular: No hyperdense vessel. Intracranial arterial calcification. Skull: No fracture or focal lesion.  Left forehead scalp hematoma. Sinuses/Orbits: No  acute finding. Other: None. CT CERVICAL SPINE FINDINGS Alignment: No evidence of traumatic malalignment. Skull base and vertebrae: No acute fracture. No primary bone lesion or focal pathologic process. Soft tissues and spinal canal: No prevertebral fluid or swelling. No visible canal hematoma. Disc levels: Multilevel spondylosis, disc space height loss, and degenerative endplate changes greatest at C5-C6 where it is moderate. No severe spinal canal narrowing. Upper chest: No acute abnormality. Other: None. IMPRESSION: 1. No acute intracranial abnormality. 2. No acute fracture in the cervical spine. Electronically Signed   By: Minerva Fester M.D.   On: 08/07/2023 22:22   CT Cervical Spine Wo Contrast Result Date: 08/07/2023 CLINICAL DATA:  Mechanical fall.  Laceration to middle of forehead. EXAM: CT HEAD WITHOUT CONTRAST CT CERVICAL SPINE WITHOUT CONTRAST TECHNIQUE: Multidetector CT imaging of the head and cervical spine was performed following the standard protocol without intravenous contrast. Multiplanar CT image reconstructions of the cervical spine were also generated. RADIATION DOSE REDUCTION: This exam was performed according to the departmental dose-optimization program which includes automated exposure control, adjustment of the mA and/or kV according to patient size and/or use of iterative reconstruction technique. COMPARISON:  None Available. FINDINGS: CT HEAD FINDINGS Brain: No intracranial hemorrhage, mass effect, or evidence of acute infarct. No hydrocephalus. No extra-axial fluid collection. Mild cerebral atrophy and chronic small vessel ischemic disease. 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 Vascular: No hyperdense vessel. Intracranial arterial calcification. Skull: No fracture or focal lesion.  Left forehead scalp hematoma. Sinuses/Orbits: No acute finding. Other: None. CT CERVICAL SPINE FINDINGS Alignment: No evidence of traumatic malalignment. Skull base and  vertebrae: No acute fracture. No primary bone lesion or focal pathologic process. Soft tissues and spinal canal: No prevertebral fluid or swelling. No visible canal hematoma. Disc levels: Multilevel spondylosis, disc space height loss, and degenerative endplate changes greatest at C5-C6 where it is moderate. No severe spinal canal narrowing. Upper chest: No acute abnormality. Other: None. IMPRESSION: 1. No acute intracranial abnormality. 2. No acute fracture in the cervical spine. Electronically Signed   By: Minerva Fester M.D.   On: 08/07/2023 22:22    PROCEDURES and INTERVENTIONS:  .1-3 Lead EKG Interpretation  Performed by: Delton Prairie, MD Authorized by: Delton Prairie, MD     Interpretation: normal     ECG rate:  70   ECG rate assessment: normal     Rhythm: sinus rhythm     Ectopy: none     Conduction: normal     Medications  traZODone (DESYREL) tablet 50 mg (has no administration in time range)  divalproex (DEPAKOTE) DR tablet 500 mg (500 mg Oral Given 08/07/23 2257)  OLANZapine zydis (ZYPREXA) disintegrating tablet 10 mg (10 mg Oral Given 08/07/23 2258)  amLODipine (NORVASC) tablet 5 mg (5 mg  Oral Given 08/07/23 2257)  acetaminophen (TYLENOL) tablet 1,000 mg (1,000 mg Oral Given 08/07/23 2257)     IMPRESSION / MDM / ASSESSMENT AND PLAN / ED COURSE  I reviewed the triage vital signs and the nursing notes.  Differential diagnosis includes, but is not limited to, seizure, cardiac dysrhythmia, syncope, mechanical fall, ICH, skull fracture  {Patient presents with symptoms of an acute illness or injury that is potentially life-threatening.  Patient presents for evaluation of a mechanical fall and head injury with a fairly benign workup and suitable for outpatient management.  Hypertensive but has been noncompliant with her and hypertensives for a few weeks.  She is asymptomatic without evidence of neurologic deficits.  Renal function at baseline.  Normal CBC.  Clear CT head and C-spine.   I considered observation admission for this patient but I believe she be suitable for trial of outpatient management with close ED return precautions.  Clinical Course as of 08/07/23 2333  Sat Aug 07, 2023  2330 Reassessed.  Patient still reports feeling well.  Provided her home BP medications.  Daughter and granddaughter have calmed down now and are more reasonable.  We discussed workup overall.  Medications at home.  Possibility of concussion.  They are eager to take her home.  We discussed close ED return precautions. [DS]    Clinical Course User Index [DS] Delton Prairie, MD     FINAL CLINICAL IMPRESSION(S) / ED DIAGNOSES   Final diagnoses:  Fall, initial encounter  Injury of head, initial encounter  Forehead abrasion, initial encounter  Concussion without loss of consciousness, initial encounter     Rx / DC Orders   ED Discharge Orders          Ordered    divalproex (DEPAKOTE) 500 MG DR tablet  2 times daily        08/07/23 2332    amLODipine (NORVASC) 5 MG tablet  Daily        08/07/23 2332    OLANZapine (ZYPREXA) 10 MG tablet  Daily at bedtime        08/07/23 2332    traZODone (DESYREL) 50 MG tablet  Daily at bedtime        08/07/23 2332             Note:  This document was prepared using Dragon voice recognition software and may include unintentional dictation errors.   Delton Prairie, MD 08/07/23 [phone]

## 2023-08-07 NOTE — ED Notes (Signed)
Family called out and stated "pt is having a stroke please help". Family had also just complained to registration about the long wait. This RN went to the pt across the lobby and pt appeared to be having a "seizure". Pt was able to talk to me   during this entire event as I took her to room 10. Pt was then moved to ED stretcher and MD smith and other staff at bedside.

## 2023-08-07 NOTE — ED Notes (Signed)
Pt presents with left side forehead raised knot with multiple non bleeding abraisions. Per pt pain only with "scrunching" her face.

## 2023-08-07 NOTE — ED Triage Notes (Signed)
Patient C/O mechanical fall. Patient tripped, fell and hit her head. No LOC. NO blood thinners. Laceration in middle of forehead. Patient C/O headache.

## 2023-08-07 NOTE — ED Notes (Signed)
Pt states that she does not know why she's here.  States that she does not remember falling.

## 2023-08-08 ENCOUNTER — Emergency Department (HOSPITAL_COMMUNITY): Payer: 59

## 2023-08-08 ENCOUNTER — Inpatient Hospital Stay (HOSPITAL_COMMUNITY): Payer: 59

## 2023-08-08 ENCOUNTER — Inpatient Hospital Stay (HOSPITAL_COMMUNITY)
Admission: EM | Admit: 2023-08-08 | Discharge: 2023-08-12 | DRG: 101 | Disposition: A | Discharge status: Skilled Nursing Facility | Payer: 59 | Source: Other Acute Inpatient Hospital | Attending: Internal Medicine | Admitting: Internal Medicine

## 2023-08-08 ENCOUNTER — Other Ambulatory Visit: Payer: Self-pay

## 2023-08-08 ENCOUNTER — Encounter (HOSPITAL_COMMUNITY): Payer: Self-pay

## 2023-08-08 DIAGNOSIS — E669 Obesity, unspecified: Secondary | ICD-10-CM

## 2023-08-08 DIAGNOSIS — N1831 Chronic kidney disease, stage 3a: Secondary | ICD-10-CM

## 2023-08-08 DIAGNOSIS — Z79899 Other long term (current) drug therapy: Secondary | ICD-10-CM

## 2023-08-08 DIAGNOSIS — Z87891 Personal history of nicotine dependence: Secondary | ICD-10-CM

## 2023-08-08 DIAGNOSIS — Z743 Need for continuous supervision: Secondary | ICD-10-CM | POA: Diagnosis not present

## 2023-08-08 DIAGNOSIS — W109XXA Fall (on) (from) unspecified stairs and steps, initial encounter: Secondary | ICD-10-CM | POA: Diagnosis present

## 2023-08-08 DIAGNOSIS — E872 Acidosis, unspecified: Secondary | ICD-10-CM

## 2023-08-08 DIAGNOSIS — E876 Hypokalemia: Secondary | ICD-10-CM | POA: Diagnosis not present

## 2023-08-08 DIAGNOSIS — R7303 Prediabetes: Secondary | ICD-10-CM | POA: Diagnosis present

## 2023-08-08 DIAGNOSIS — R062 Wheezing: Secondary | ICD-10-CM

## 2023-08-08 DIAGNOSIS — I672 Cerebral atherosclerosis: Secondary | ICD-10-CM | POA: Diagnosis not present

## 2023-08-08 DIAGNOSIS — I10 Essential (primary) hypertension: Secondary | ICD-10-CM | POA: Diagnosis not present

## 2023-08-08 DIAGNOSIS — T426X6A Underdosing of other antiepileptic and sedative-hypnotic drugs, initial encounter: Secondary | ICD-10-CM | POA: Diagnosis present

## 2023-08-08 DIAGNOSIS — R2681 Unsteadiness on feet: Secondary | ICD-10-CM | POA: Diagnosis present

## 2023-08-08 DIAGNOSIS — R569 Unspecified convulsions: Secondary | ICD-10-CM | POA: Diagnosis not present

## 2023-08-08 DIAGNOSIS — Z91148 Patient's other noncompliance with medication regimen for other reason: Secondary | ICD-10-CM

## 2023-08-08 DIAGNOSIS — G40909 Epilepsy, unspecified, not intractable, without status epilepticus: Secondary | ICD-10-CM | POA: Diagnosis not present

## 2023-08-08 DIAGNOSIS — I4510 Unspecified right bundle-branch block: Secondary | ICD-10-CM | POA: Diagnosis not present

## 2023-08-08 DIAGNOSIS — R6889 Other general symptoms and signs: Secondary | ICD-10-CM | POA: Diagnosis not present

## 2023-08-08 DIAGNOSIS — D72829 Elevated white blood cell count, unspecified: Secondary | ICD-10-CM

## 2023-08-08 DIAGNOSIS — S0990XA Unspecified injury of head, initial encounter: Secondary | ICD-10-CM | POA: Diagnosis not present

## 2023-08-08 DIAGNOSIS — Z1152 Encounter for screening for COVID-19: Secondary | ICD-10-CM | POA: Diagnosis not present

## 2023-08-08 DIAGNOSIS — R296 Repeated falls: Secondary | ICD-10-CM | POA: Diagnosis not present

## 2023-08-08 DIAGNOSIS — N1832 Chronic kidney disease, stage 3b: Secondary | ICD-10-CM | POA: Diagnosis present

## 2023-08-08 DIAGNOSIS — R509 Fever, unspecified: Secondary | ICD-10-CM | POA: Diagnosis not present

## 2023-08-08 DIAGNOSIS — I6782 Cerebral ischemia: Secondary | ICD-10-CM | POA: Diagnosis not present

## 2023-08-08 DIAGNOSIS — R0602 Shortness of breath: Secondary | ICD-10-CM

## 2023-08-08 DIAGNOSIS — Z91018 Allergy to other foods: Secondary | ICD-10-CM

## 2023-08-08 DIAGNOSIS — J449 Chronic obstructive pulmonary disease, unspecified: Secondary | ICD-10-CM | POA: Diagnosis not present

## 2023-08-08 DIAGNOSIS — Y92009 Unspecified place in unspecified non-institutional (private) residence as the place of occurrence of the external cause: Secondary | ICD-10-CM

## 2023-08-08 DIAGNOSIS — Z6835 Body mass index (BMI) 35.0-35.9, adult: Secondary | ICD-10-CM

## 2023-08-08 DIAGNOSIS — E7800 Pure hypercholesterolemia, unspecified: Secondary | ICD-10-CM | POA: Diagnosis present

## 2023-08-08 DIAGNOSIS — I129 Hypertensive chronic kidney disease with stage 1 through stage 4 chronic kidney disease, or unspecified chronic kidney disease: Secondary | ICD-10-CM | POA: Diagnosis present

## 2023-08-08 DIAGNOSIS — Z818 Family history of other mental and behavioral disorders: Secondary | ICD-10-CM

## 2023-08-08 DIAGNOSIS — E66811 Obesity, class 1: Secondary | ICD-10-CM | POA: Diagnosis present

## 2023-08-08 DIAGNOSIS — R918 Other nonspecific abnormal finding of lung field: Secondary | ICD-10-CM | POA: Diagnosis not present

## 2023-08-08 DIAGNOSIS — F319 Bipolar disorder, unspecified: Secondary | ICD-10-CM

## 2023-08-08 DIAGNOSIS — S0083XA Contusion of other part of head, initial encounter: Secondary | ICD-10-CM | POA: Diagnosis not present

## 2023-08-08 HISTORY — DX: Unspecified convulsions: R56.9

## 2023-08-08 LAB — RESPIRATORY PANEL BY PCR

## 2023-08-08 LAB — COMPREHENSIVE METABOLIC PANEL
ALT: 19 U/L (ref 0–44)
AST: 24 U/L (ref 15–41)
Albumin: 4.3 g/dL (ref 3.5–5.0)
Alkaline Phosphatase: 45 U/L (ref 38–126)
Anion gap: 16 — ABNORMAL HIGH (ref 5–15)
BUN: 12 mg/dL (ref 8–23)
CO2: 15 mmol/L — ABNORMAL LOW (ref 22–32)
Calcium: 9.7 mg/dL (ref 8.9–10.3)
Chloride: 106 mmol/L (ref 98–111)
Creatinine, Ser: 1.28 mg/dL — ABNORMAL HIGH (ref 0.44–1.00)
GFR, Estimated: 45 mL/min — ABNORMAL LOW (ref >60)
Glucose, Bld: 152 mg/dL — ABNORMAL HIGH (ref 70–99)
Potassium: 3.4 mmol/L — ABNORMAL LOW (ref 3.5–5.1)
Sodium: 137 mmol/L (ref 135–145)
Total Bilirubin: 0.7 mg/dL (ref 0.0–1.2)
Total Protein: 7.3 g/dL (ref 6.5–8.1)

## 2023-08-08 LAB — CBC WITH DIFFERENTIAL/PLATELET
Abs Immature Granulocytes: 0.08 10*3/uL — ABNORMAL HIGH (ref 0.00–0.07)
Basophils Absolute: 0 10*3/uL (ref 0.0–0.1)
Basophils Relative: 0 %
Eosinophils Absolute: 0.1 10*3/uL (ref 0.0–0.5)
Eosinophils Relative: 0 %
HCT: 42.4 % (ref 36.0–46.0)
Hemoglobin: 13.6 g/dL (ref 12.0–15.0)
Immature Granulocytes: 1 %
Lymphocytes Relative: 26 %
Lymphs Abs: 3.2 10*3/uL (ref 0.7–4.0)
MCH: 29.4 pg (ref 26.0–34.0)
MCHC: 32.1 g/dL (ref 30.0–36.0)
MCV: 91.6 fL (ref 80.0–100.0)
Monocytes Absolute: 0.7 10*3/uL (ref 0.1–1.0)
Monocytes Relative: 6 %
Neutro Abs: 8.1 10*3/uL — ABNORMAL HIGH (ref 1.7–7.7)
Neutrophils Relative %: 67 %
Platelets: 300 10*3/uL (ref 150–400)
RBC: 4.63 MIL/uL (ref 3.87–5.11)
RDW: 14.7 % (ref 11.5–15.5)
WBC: 12.1 10*3/uL — ABNORMAL HIGH (ref 4.0–10.5)
nRBC: 0 % (ref 0.0–0.2)

## 2023-08-08 LAB — TROPONIN I (HIGH SENSITIVITY): Troponin I (High Sensitivity): 9 ng/L (ref <18)

## 2023-08-08 LAB — CK: Total CK: 64 U/L (ref 38–234)

## 2023-08-08 LAB — SARS CORONAVIRUS 2 BY RT PCR: SARS Coronavirus 2 by RT PCR: NEGATIVE

## 2023-08-08 LAB — PROCALCITONIN: Procalcitonin: <0.1 ng/mL

## 2023-08-08 LAB — MAGNESIUM: Magnesium: 2 mg/dL (ref 1.7–2.4)

## 2023-08-08 MED ORDER — METOPROLOL SUCCINATE ER 50 MG PO TB24
50.0000 mg | ORAL_TABLET | Freq: Every day | ORAL | Status: DC
  Administered 2023-08-08 – 2023-08-12 (×5): 50 mg via ORAL
  Filled 2023-08-08 (×5): qty 1

## 2023-08-08 MED ORDER — ACETAMINOPHEN 325 MG PO TABS
650.0000 mg | ORAL_TABLET | Freq: Four times a day (QID) | ORAL | Status: DC | PRN
  Administered 2023-08-10 (×2): 650 mg via ORAL
  Filled 2023-08-08 (×2): qty 2

## 2023-08-08 MED ORDER — SODIUM CHLORIDE 0.9 % IV SOLN: INTRAVENOUS | Status: DC

## 2023-08-08 MED ORDER — ONDANSETRON HCL 4 MG/2ML IJ SOLN
4.0000 mg | Freq: Four times a day (QID) | INTRAMUSCULAR | Status: DC | PRN
  Administered 2023-08-10: 4 mg via INTRAVENOUS
  Filled 2023-08-08: qty 2

## 2023-08-08 MED ORDER — ACETAMINOPHEN 650 MG RE SUPP: 650.0000 mg | Freq: Four times a day (QID) | RECTAL | Status: DC | PRN

## 2023-08-08 MED ORDER — DIVALPROEX SODIUM 500 MG PO DR TAB
500.0000 mg | DELAYED_RELEASE_TABLET | Freq: Two times a day (BID) | ORAL | Status: DC
  Administered 2023-08-08 – 2023-08-12 (×8): 500 mg via ORAL
  Filled 2023-08-08 (×8): qty 1

## 2023-08-08 MED ORDER — MIRABEGRON ER 25 MG PO TB24
25.0000 mg | ORAL_TABLET | Freq: Every day | ORAL | Status: DC
  Administered 2023-08-08 – 2023-08-12 (×5): 25 mg via ORAL
  Filled 2023-08-08 (×5): qty 1

## 2023-08-08 MED ORDER — LORAZEPAM 2 MG/ML IJ SOLN: 1.0000 mg | INTRAMUSCULAR | Status: DC | PRN

## 2023-08-08 MED ORDER — MAGNESIUM GLUCONATE 500 MG PO TABS
500.0000 mg | ORAL_TABLET | Freq: Every day | ORAL | Status: DC
  Administered 2023-08-08 – 2023-08-10 (×3): 500 mg via ORAL
  Filled 2023-08-08 (×4): qty 1

## 2023-08-08 MED ORDER — SODIUM CHLORIDE 0.9% FLUSH
3.0000 mL | Freq: Two times a day (BID) | INTRAVENOUS | Status: DC
  Administered 2023-08-08 – 2023-08-11 (×7): 3 mL via INTRAVENOUS

## 2023-08-08 MED ORDER — OLANZAPINE 5 MG PO TABS
10.0000 mg | ORAL_TABLET | Freq: Every day | ORAL | Status: DC
  Administered 2023-08-08 – 2023-08-09 (×2): 10 mg via ORAL
  Filled 2023-08-08 (×2): qty 2

## 2023-08-08 MED ORDER — POTASSIUM CHLORIDE CRYS ER 10 MEQ PO TBCR
10.0000 meq | EXTENDED_RELEASE_TABLET | Freq: Every day | ORAL | Status: DC
Start: 2023-08-08 — End: 2023-08-12
  Administered 2023-08-08 – 2023-08-12 (×5): 10 meq via ORAL
  Filled 2023-08-08 (×5): qty 1

## 2023-08-08 MED ORDER — AMLODIPINE BESYLATE 5 MG PO TABS
5.0000 mg | ORAL_TABLET | Freq: Every day | ORAL | Status: DC
  Administered 2023-08-08 – 2023-08-12 (×5): 5 mg via ORAL
  Filled 2023-08-08 (×5): qty 1

## 2023-08-08 MED ORDER — TRAZODONE HCL 50 MG PO TABS
50.0000 mg | ORAL_TABLET | Freq: Every evening | ORAL | Status: DC | PRN
  Administered 2023-08-08 – 2023-08-09 (×2): 50 mg via ORAL
  Filled 2023-08-08 (×2): qty 1

## 2023-08-08 MED ORDER — ENOXAPARIN SODIUM 40 MG/0.4ML IJ SOSY
40.0000 mg | PREFILLED_SYRINGE | INTRAMUSCULAR | Status: DC
  Administered 2023-08-08 – 2023-08-11 (×4): 40 mg via SUBCUTANEOUS
  Filled 2023-08-08 (×5): qty 0.4

## 2023-08-08 MED ORDER — ALBUTEROL SULFATE (2.5 MG/3ML) 0.083% IN NEBU: 2.5000 mg | INHALATION_SOLUTION | Freq: Four times a day (QID) | RESPIRATORY_TRACT | Status: DC | PRN

## 2023-08-08 MED ORDER — POTASSIUM CHLORIDE 10 MEQ/100ML IV SOLN
10.0000 meq | INTRAVENOUS | Status: AC
  Administered 2023-08-08 (×2): 10 meq via INTRAVENOUS
  Filled 2023-08-08: qty 100

## 2023-08-08 MED ORDER — MOMETASONE FURO-FORMOTEROL FUM 100-5 MCG/ACT IN AERO
2.0000 | INHALATION_SPRAY | Freq: Two times a day (BID) | RESPIRATORY_TRACT | Status: DC
  Administered 2023-08-09 – 2023-08-12 (×6): 2 via RESPIRATORY_TRACT
  Filled 2023-08-08 (×2): qty 8.8

## 2023-08-08 MED ORDER — HYDRALAZINE HCL 20 MG/ML IJ SOLN: 10.0000 mg | INTRAMUSCULAR | Status: DC | PRN

## 2023-08-08 MED ORDER — VALPROATE SODIUM 100 MG/ML IV SOLN
500.0000 mg | Freq: Once | INTRAVENOUS | Status: AC
  Administered 2023-08-08: 500 mg via INTRAVENOUS
  Filled 2023-08-08: qty 5

## 2023-08-08 MED ORDER — LORAZEPAM 2 MG/ML IJ SOLN: 1.0000 mg | Freq: Once | INTRAMUSCULAR | Status: AC

## 2023-08-08 MED ORDER — VALPROATE SODIUM 100 MG/ML IV SOLN
1000.0000 mg | Freq: Once | INTRAVENOUS | Status: AC
  Administered 2023-08-08: 1000 mg via INTRAVENOUS
  Filled 2023-08-08: qty 10

## 2023-08-08 MED ORDER — LORAZEPAM 2 MG/ML IJ SOLN
INTRAMUSCULAR | Status: AC
  Administered 2023-08-08: 1 mg via INTRAVENOUS
  Filled 2023-08-08: qty 1

## 2023-08-08 MED ORDER — ONDANSETRON HCL 4 MG PO TABS: 4.0000 mg | ORAL_TABLET | Freq: Four times a day (QID) | ORAL | Status: DC | PRN

## 2023-08-08 NOTE — Progress Notes (Signed)
LTM EEG hooked up and running - no initial skin breakdown - push button tested - [facility] NOT monitoring pt in ED.

## 2023-08-08 NOTE — ED Notes (Signed)
MD contacted regarding patient diet order status.

## 2023-08-08 NOTE — ED Notes (Signed)
Patient appeared to seize in stretcher. Patient's O2 saturation lowered to 78 and she is post ictal. MD notified

## 2023-08-08 NOTE — ED Notes (Signed)
Patient tolerating diet order at this time, able to follow commands and is more alert per MD order.

## 2023-08-08 NOTE — Consult Note (Signed)
NEUROLOGY CONSULT NOTE   Date of service: August 08, 2023 Patient Name: Shelby Jordan MRN:  [id number] DOB:  07/17/1951 Chief Complaint: "multiple seizure like spells" Requesting Provider: Eduard Clos, MD  History of Present Illness  Shelby Jordan is a 73 y.o. female with hx of depression, Bipolar 1, Hep C, HTN, who presents with multiple episodes concerning for seizures.  Daughter and grand daughter at bedside report that she has had spells concerning for seizures in the past and they have noted atleast four such spells with the last one being about 4 years ago. She ran out of her medications about a month ago and they are in the process of establishing with a new Psychiatrist to resume medications. She takes Valproic acid at home for her mood. Tonight, around 2030, patient came back after going to neighbor's house. She tripped over a step and fell in the house. She did not lose consciousness but did hit her head and has a bump on her head. They took her to [facility] ED where she was in the waiting room and then seemed like she was drowsy vs slumped over and then face was contorted and looking to the right and arm up and had a brief seizure. She was taken to a room and had workup with CT Head w/o contrast and was put back on her depakote and given 500mg  PO once there and was discharged after getting her night medications.  She was fine and interactive and verbal when she got home and was back to her baseline. Daughter then, could not find her in the upstairs bedroom where patient was supposed to be. They gound her lying down in the closet and was sleepy and looked like she just had a seizure. They brought her to the ED where, she had 2 additional seizures and one was witnessed by the ED team and seemed concerning for epileptic seizure. She was given ativan 2mg  and seemed post ictal/sedated afterwards.  Neurology consulted for further evaluation and workup for multiple seizures.  No hx of  EtOH use, family hx of seizure, no hx of brain tumor or ICH or stroke or meningitis. No other significant head injury with LOC.     ROS  Unable to ascertain due to somnolence and post ictal.  Past History   Past Medical History:  Diagnosis Date   Bipolar 1 disorder (HCC)    Depression    Hepatitis C    High cholesterol    Hypertension    Seizures (HCC)    UTI (urinary tract infection)     Past Surgical History:  Procedure Laterality Date   APPENDECTOMY     BREAST BIOPSY Right 03/04/2022   stereo bx, mass "X" clip-path pending   TOE AMPUTATION      Family History: Family History  Problem Relation Age of Onset   Mental illness Mother        born in mental hospital   Breast cancer Neg Hx     Social History  reports that she quit smoking about 2 years ago. Her smoking use included cigarettes. She started smoking about 32 years ago. She has a 30 pack-year smoking history. She has been exposed to tobacco smoke. She has never used smokeless tobacco. She reports that she does not drink alcohol and does not use drugs.  Allergies  Allergen Reactions   Tomato (Diagnostic) Other (See Comments)    Tongue break out.    Citrus Rash    Medications   Current  Facility-Administered Medications:    LORazepam (ATIVAN) injection 1 mg, 1 mg, Intravenous, PRN, Mesner, Barbara Cower, MD   potassium chloride 10 mEq in 100 mL IVPB, 10 mEq, Intravenous, Q1 Hr x 2, Eduard Clos, MD   valproate (DEPACON) 1,000 mg in dextrose 5 % 50 mL IVPB, 1,000 mg, Intravenous, Once, Mesner, Barbara Cower, MD, Last Rate: 60 mL/hr at 08/08/23 0622, 1,000 mg at 08/08/23 1610  Current Outpatient Medications:    amLODipine (NORVASC) 5 MG tablet, Take 1 tablet (5 mg total) by mouth daily., Disp: 30 tablet, Rfl: 0   divalproex (DEPAKOTE) 500 MG DR tablet, Take 1 tablet (500 mg total) by mouth 2 (two) times daily., Disp: 60 tablet, Rfl: 0   magnesium gluconate (MAGONATE) 500 MG tablet, Take 500 mg by mouth at bedtime.,  Disp: , Rfl:    metoprolol succinate (TOPROL-XL) 50 MG 24 hr tablet, TAKE 1 TABLET BY MOUTH EVERY DAY, Disp: 90 tablet, Rfl: 1   OLANZapine (ZYPREXA) 10 MG tablet, Take 1 tablet (10 mg total) by mouth at bedtime., Disp: 30 tablet, Rfl: 0   potassium chloride (KLOR-CON M) 10 MEQ tablet, Take 1 tablet (10 mEq total) by mouth daily., Disp: 30 tablet, Rfl: 3   traZODone (DESYREL) 50 MG tablet, Take 1 tablet (50 mg total) by mouth at bedtime. (Patient taking differently: Take 50 mg by mouth at bedtime as needed for sleep.), Disp: 30 tablet, Rfl: 0   Vibegron (GEMTESA) 75 MG TABS, Take 1 tablet (75 mg total) by mouth daily., Disp: , Rfl:    WIXELA INHUB 100-50 MCG/ACT AEPB, INHALE 1 PUFF INTO THE LUNGS TWICE A DAY (Patient taking differently: Inhale 1 puff into the lungs daily as needed (for shortness of breath).), Disp: 60 each, Rfl: 3   albuterol (VENTOLIN HFA) 108 (90 Base) MCG/ACT inhaler, Inhale 2 puffs into the lungs every 4 (four) hours as needed for shortness of breath. (Patient not taking: Reported on 08/08/2023), Disp: 8.5 each, Rfl: 0   Blood Pressure Monitoring (BLOOD PRESSURE CUFF) MISC, 1 each by Does not apply route daily., Disp: 1 each, Rfl: 0  Vitals   Vitals:   08/08/23 0147 08/08/23 0213 08/08/23 0600 08/08/23 0615  BP:  (!) 176/74 125/65 131/74  Pulse:  99 94 92  Resp:  (!) 22 19 18   Temp:  (!) 96.7 F (35.9 C) (!) 97.5 F (36.4 C)   TempSrc:  Axillary Axillary   SpO2:  100% 97% 97%  Weight: 94 kg     Height: 5\' 4"  (1.626 m)       Body mass index is 35.57 kg/m.  Physical Exam   General: Laying comfortably in bed; in no acute distress.  HENT: Normal oropharynx and mucosa. Normal external appearance of ears and nose.  Neck: Supple, no pain or tenderness  CV: No JVD. No peripheral edema.  Pulmonary: Symmetric Chest rise. Normal respiratory effort.  Abdomen: Soft to touch, non-tender.  Ext: No cyanosis, edema, or deformity  Skin: No rash. Normal palpation of skin.    Musculoskeletal: Normal digits and nails by inspection. No clubbing.   Neurologic Examination  Mental status/Cognition: somnolent but wakes up and oriented to self, place, thinks she is 73 years old.  Keeps dozing off is very poor attention. Speech/language: scant speech output secondary to somnolence/post ictal. Counts fingers, follows 1 step commands Cranial nerves:   CN II Pupils equal and reactive to light, unable to assess for visual field deficits.   CN III,IV,VI No gaze preference or deviation.  Looks left and right.   CN V normal sensation in V1, V2, and V3 segments bilaterally   CN VII no asymmetry, no nasolabial fold flattening   CN VIII normal hearing to speech   CN IX & X normal palatal elevation, no uvular deviation   CN XI Head is midline   CN XII midline tongue protrusion   Motor:  Muscle bulk: normal, tone normal Limited motor evaluation due to somnolence/postictal. Follows commands with all four extremities.  Grip strength is 5 out of 5 bilaterally.  Wiggles toes on command.  Sensation:  Light touch    Pin prick Localizes to proximal pinch in all extremities.   Temperature    Vibration   Proprioception    Coordination/Complex Motor:  - Finger to Nose intact bilaterally - Heel to shin difficult to do due to body habitus, somnolence. - Rapid alternating movement are slowed. - Gait: deferred for patient safety.   Labs/Imaging/Neurodiagnostic studies   CBC:  Recent Labs  Lab Sep 04, 2023 2155 08/08/23 0311  WBC 6.7 12.1*  NEUTROABS 5.0 8.1*  HGB 13.6 13.6  HCT 40.9 42.4  MCV 86.8 91.6  PLT 284 300   Basic Metabolic Panel:  Lab Results  Component Value Date   NA 137 08/08/2023   K 3.4 (L) 08/08/2023   CO2 15 (L) 08/08/2023   GLUCOSE 152 (H) 08/08/2023   BUN 12 08/08/2023   CREATININE 1.28 (H) 08/08/2023   CALCIUM 9.7 08/08/2023   GFRNONAA 45 (L) 08/08/2023   GFRAA 40 (L) 01/01/2020   Lipid Panel:  Lab Results  Component Value Date   LDLCALC  135 (H) 11/28/2021   HgbA1c:  Lab Results  Component Value Date   HGBA1C 6.1 04/15/2023   Urine Drug Screen:     Component Value Date/Time   LABOPIA NONE DETECTED 07/20/2022 0250   LABOPIA NONE DETECTED 07/29/2021 0505   COCAINSCRNUR NONE DETECTED 07/20/2022 0250   LABBENZ NONE DETECTED 07/20/2022 0250   LABBENZ NONE DETECTED 07/29/2021 0505   AMPHETMU NONE DETECTED 07/20/2022 0250   AMPHETMU NONE DETECTED 07/29/2021 0505   THCU NONE DETECTED 07/20/2022 0250   THCU NONE DETECTED 07/29/2021 0505   LABBARB NONE DETECTED 07/20/2022 0250   LABBARB NONE DETECTED 07/29/2021 0505    Alcohol Level     Component Value Date/Time   ETH <10 07/19/2022 1045   INR No results found for: "INR" APTT No results found for: "APTT" AED levels: No results found for: "PHENYTOIN", "ZONISAMIDE", "LAMOTRIGINE", "LEVETIRACETA"  CT Head without contrast(Personally reviewed): CTH was negative for a large hypodensity concerning for a large territory infarct or hyperdensity concerning for an ICH  Neurodiagnostics cEEG:  pending  ASSESSMENT   Dorcus Alejandre is a 73 y.o. female with hx of depression, Bipolar 1, Hep C, HTN, who presents with multiple episodes concerning for seizures after running out of Depakote a month ago which she uses for mood. She is post ictal/sedated from ativan at this time. Unclear if the episodes truly are epileptic or non epileptic. Given multiple episodes, plan is to put her up on LTM EEG for spell capture and to observe overnight to monitor for seizure clustering.  RECOMMENDATIONS  - continue Depakote 500mg  BID, her home dose. - LTM EEG. - observe overnight for seizure clustering. - further AEDs based on LTM EEG. ______________________________________________________________________    Signed, Erick Blinks, MD [facility]

## 2023-08-08 NOTE — ED Provider Notes (Signed)
[place] EMERGENCY DEPARTMENT AT [facility] Provider Note   CSN: [id number] Arrival date & time: 08/08/23  0144     History  Chief Complaint  Patient presents with   Fall    Patient to ED via EMS with complaint of frequent falls. EMS reports patient was found on floor by family whom believes she had a seizure with history of.    Shelby Jordan is a 73 y.o. female.  73 yo F here with falls and seizures. Reported seizure history and hypertension and not on medications recently. Also has h/o schizo and BPD and thus was on depakote to help with both seizures and psychosis. Had a fall earlier tonight and seizure like activity. Went to [facility] [facility], had a seizure like episode there but documented to be talking through it. Got a head ct, labs and was stable so discharged on her home dose of depakote. Went home. Found on the floor again with seizure likea ctivty and brought here. Prior to my evaluation nursing noted <60 seconds tonic clonic. Patient not able to give history now 2/2 sleepiness/postictal.   Fall       Home Medications Prior to Admission medications   Medication Sig Start Date End Date Taking? Authorizing Provider  albuterol (VENTOLIN HFA) 108 (90 Base) MCG/ACT inhaler Inhale 2 puffs into the lungs every 4 (four) hours as needed for shortness of breath. 12/10/22   Mort Sawyers, FNP  amLODipine (NORVASC) 5 MG tablet Take 1 tablet (5 mg total) by mouth daily. 12/10/22   Mort Sawyers, FNP  amLODipine (NORVASC) 5 MG tablet Take 1 tablet (5 mg total) by mouth daily. 08/07/23 08/06/24  Delton Prairie, MD  Blood Pressure Monitoring (BLOOD PRESSURE CUFF) MISC 1 each by Does not apply route daily. 12/10/22   Mort Sawyers, FNP  divalproex (DEPAKOTE) 500 MG DR tablet TAKE 1 TABLET BY MOUTH TWICE A DAY 04/22/23   Mort Sawyers, FNP  divalproex (DEPAKOTE) 500 MG DR tablet Take 1 tablet (500 mg total) by mouth 2 (two) times daily. 08/07/23   Delton Prairie, MD  magnesium gluconate  (MAGONATE) 500 MG tablet Take 500 mg by mouth at bedtime.    [provider]  metoprolol succinate (TOPROL-XL) 50 MG 24 hr tablet TAKE 1 TABLET BY MOUTH EVERY DAY 03/08/23   Mort Sawyers, FNP  OLANZapine (ZYPREXA) 10 MG tablet Take 1 tablet (10 mg total) by mouth at bedtime. 02/09/23   Mort Sawyers, FNP  OLANZapine (ZYPREXA) 10 MG tablet Take 1 tablet (10 mg total) by mouth at bedtime. 08/07/23 08/06/24  Delton Prairie, MD  potassium chloride (KLOR-CON M) 10 MEQ tablet Take 1 tablet (10 mEq total) by mouth daily. 07/31/22   Sarina Ill, DO  traZODone (DESYREL) 50 MG tablet Take 1 tablet (50 mg total) by mouth at bedtime. 07/30/22   Sarina Ill, DO  traZODone (DESYREL) 50 MG tablet Take 1 tablet (50 mg total) by mouth at bedtime. 08/07/23   Delton Prairie, MD  Vibegron (GEMTESA) 75 MG TABS Take 1 tablet (75 mg total) by mouth daily. 04/19/23   MacDiarmid, Lorin Picket, MD  Monte Fantasia INHUB 100-50 MCG/ACT AEPB INHALE 1 PUFF INTO THE LUNGS TWICE A DAY 03/15/23   Mort Sawyers, FNP      Allergies    Tomato (diagnostic) and Citrus    Review of Systems   Review of Systems  Physical Exam Updated Vital Signs BP (!) 176/74 (BP Location: Right Arm)   Pulse 99   Temp (!) 96.7 F (  35.9 C) (Axillary)   Resp (!) 22   Ht 5\' 4"  (1.626 m)   Wt 94 kg   SpO2 100%   BMI 35.57 kg/m  Physical Exam Vitals and nursing note reviewed.  Constitutional:      Appearance: She is well-developed.  HENT:     Head: Normocephalic.     Comments: Ecchymosis and edema to forehead Cardiovascular:     Rate and Rhythm: Normal rate and regular rhythm.  Pulmonary:     Effort: No respiratory distress.     Breath sounds: No stridor.  Abdominal:     General: There is no distension.  Musculoskeletal:     Cervical back: Normal range of motion.  Neurological:     Mental Status: She is alert.     Comments: Moving all extremities. Altered. EOMI. Pupils somewhat constricted. No obvious facial asymmetry.       ED Results / Procedures / Treatments   Labs (all labs ordered are listed, but only abnormal results are displayed) Labs Reviewed  CBC WITH DIFFERENTIAL/PLATELET - Abnormal; Notable for the following components:      Result Value   WBC 12.1 (*)    Neutro Abs 8.1 (*)    Abs Immature Granulocytes 0.08 (*)    All other components within normal limits  COMPREHENSIVE METABOLIC PANEL - Abnormal; Notable for the following components:   Potassium 3.4 (*)    CO2 15 (*)    Glucose, Bld 152 (*)    Creatinine, Ser 1.28 (*)    GFR, Estimated 45 (*)    Anion gap 16 (*)    All other components within normal limits  MAGNESIUM  URINALYSIS, ROUTINE W REFLEX MICROSCOPIC  TROPONIN I (HIGH SENSITIVITY)    EKG None  Radiology CT Head Wo Contrast Result Date: 08/08/2023 CLINICAL DATA:  New onset seizure. EXAM: CT HEAD WITHOUT CONTRAST TECHNIQUE: Contiguous axial images were obtained from the base of the skull through the vertex without intravenous contrast. RADIATION DOSE REDUCTION: This exam was performed according to the departmental dose-optimization program which includes automated exposure control, adjustment of the mA and/or kV according to patient size and/or use of iterative reconstruction technique. COMPARISON:  CT head 08/07/2023 FINDINGS: Brain: No intracranial hemorrhage, mass effect, or evidence of acute infarct. No hydrocephalus. No extra-axial fluid collection. Age-commensurate cerebral atrophy and chronic small vessel ischemic disease. 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 Chronic left cerebellar infarct. Vascular: No hyperdense vessel. Intracranial arterial calcification. Skull: No fracture or focal lesion.  Left forehead hematoma. Sinuses/Orbits: No acute finding. Other: None. IMPRESSION: 1. No acute intracranial abnormality. 2. Left forehead hematoma. No skull fracture. Electronically Signed   By: Minerva Fester M.D.   On: 08/08/2023 03:56    CT HEAD WO CONTRAST ( ) Result Date: 08/07/2023 CLINICAL DATA:  Mechanical fall.  Laceration to middle of forehead. EXAM: CT HEAD WITHOUT CONTRAST CT CERVICAL SPINE WITHOUT CONTRAST TECHNIQUE: Multidetector CT imaging of the head and cervical spine was performed following the standard protocol without intravenous contrast. Multiplanar CT image reconstructions of the cervical spine were also generated. RADIATION DOSE REDUCTION: This exam was performed according to the departmental dose-optimization program which includes automated exposure control, adjustment of the mA and/or kV according to patient size and/or use of iterative reconstruction technique. COMPARISON:  None Available. FINDINGS: CT HEAD FINDINGS Brain: No intracranial hemorrhage, mass effect, or evidence of acute infarct. No hydrocephalus. No extra-axial fluid collection. Mild cerebral atrophy and chronic small vessel ischemic disease. Chronic  infarct or prominent perivascular space in the left basal ganglia. Vascular: No hyperdense vessel. Intracranial arterial calcification. Skull: No fracture or focal lesion.  Left forehead scalp hematoma. Sinuses/Orbits: No acute finding. Other: None. CT CERVICAL SPINE FINDINGS Alignment: No evidence of traumatic malalignment. Skull base and vertebrae: No acute fracture. No primary bone lesion or focal pathologic process. Soft tissues and spinal canal: No prevertebral fluid or swelling. No visible canal hematoma. Disc levels: Multilevel spondylosis, disc space height loss, and degenerative endplate changes greatest at C5-C6 where it is moderate. No severe spinal canal narrowing. Upper chest: No acute abnormality. Other: None. IMPRESSION: 1. No acute intracranial abnormality. 2. No acute fracture in the cervical spine. Electronically Signed   By: Minerva Fester M.D.   On: 08/07/2023 22:22   CT Cervical Spine Wo Contrast Result Date: 08/07/2023 CLINICAL DATA:  Mechanical fall.  Laceration to middle of  forehead. EXAM: CT HEAD WITHOUT CONTRAST CT CERVICAL SPINE WITHOUT CONTRAST TECHNIQUE: Multidetector CT imaging of the head and cervical spine was performed following the standard protocol without intravenous contrast. Multiplanar CT image reconstructions of the cervical spine were also generated. RADIATION DOSE REDUCTION: This exam was performed according to the departmental dose-optimization program which includes automated exposure control, adjustment of the mA and/or kV according to patient size and/or use of iterative reconstruction technique. COMPARISON:  None Available. FINDINGS: CT HEAD FINDINGS Brain: No intracranial hemorrhage, mass effect, or evidence of acute infarct. No hydrocephalus. No extra-axial fluid collection. Mild cerebral atrophy and chronic small vessel ischemic disease. 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 Vascular: No hyperdense vessel. Intracranial arterial calcification. Skull: No fracture or focal lesion.  Left forehead scalp hematoma. Sinuses/Orbits: No acute finding. Other: None. CT CERVICAL SPINE FINDINGS Alignment: No evidence of traumatic malalignment. Skull base and vertebrae: No acute fracture. No primary bone lesion or focal pathologic process. Soft tissues and spinal canal: No prevertebral fluid or swelling. No visible canal hematoma. Disc levels: Multilevel spondylosis, disc space height loss, and degenerative endplate changes greatest at C5-C6 where it is moderate. No severe spinal canal narrowing. Upper chest: No acute abnormality. Other: None. IMPRESSION: 1. No acute intracranial abnormality. 2. No acute fracture in the cervical spine. Electronically Signed   By: Minerva Fester M.D.   On: 08/07/2023 22:22    Procedures .Critical Care  Performed by: Marily Memos, MD Authorized by: Marily Memos, MD   Critical care provider statement:    Critical care time (minutes):  30   Critical care was necessary to treat or prevent imminent  or life-threatening deterioration of the following conditions:  CNS failure or compromise   Critical care was time spent personally by me on the following activities:  Development of treatment plan with patient or surrogate, discussions with consultants, evaluation of patient's response to treatment, examination of patient, ordering and review of laboratory studies, ordering and review of radiographic studies, ordering and performing treatments and interventions, pulse oximetry, re-evaluation of patient's condition and review of old charts     Medications Ordered in ED Medications  valproate (DEPACON) 500 mg in dextrose 5 % 50 mL IVPB (has no administration in time range)  LORazepam (ATIVAN) injection 1 mg (has no administration in time range)  LORazepam (ATIVAN) injection 1 mg (1 mg Intravenous Given 08/08/23 7322)    ED Course/ Medical Decision Making/ A&P  Medical Decision Making Amount and/or Complexity of Data Reviewed Labs: ordered. Radiology: ordered. ECG/medicine tests: ordered.  Risk Prescription drug management. Decision regarding hospitalization.   Patient with another seizure witnessed by myself, unresponsive, hypoxic for about 20-30 seconds. Ativan given. Appeared to legitimate seizure activity with postictal state. Bicarb a bit lower than earlier in the night. Ct negative. Depakote loaded to compolete a 20 mg/kg dose. Neuor consulted agrees with observation and EEG. Medicine admit. No further seizure activity in the ED.   Final Clinical Impression(s) / ED Diagnoses Final diagnoses:  Seizure [facility])    Rx / DC Orders ED Discharge Orders     None         Jennavieve Arrick, Barbara Cower, MD 08/08/23 2314

## 2023-08-08 NOTE — H&P (Signed)
History and Physical    Patient: Shelby Jordan ZOX:[id number] DOB: 1950/10/13 DOA: 08/08/2023 DOS: the patient was seen and examined on 08/08/2023 PCP: Mort Sawyers, FNP  Patient coming from: Home  Chief Complaint:  Chief Complaint  Patient presents with   Fall    Patient to ED via EMS with complaint of frequent falls. EMS reports patient was found on floor by family whom believes she had a seizure with history of.   HPI: Shelby Jordan is a 73 y.o. female with medical history significant of hypertension, hyperlipidemia, bipolar disorder,  seizure disorder, chronic kidney disease stage IIIa, and obesity who initially presented after having a fall.  History is obtained from the patient's daughter and granddaughter present at bedside and she is currently sedated.  Patient's daughter thinks she tripped over the step up coming into the house which caused her to fall hitting the middle left side of her forehead.  She screamed and her granddaughter who was in the house came down to check on her.  There was no reported loss of consciousness.  She seemed okay after the fall, but family called EMS and she was evaluated at [facility].  While in the ED triage the patient was noted to have episode with fascial distortion that was concerning for possible seizure or stroke for which she was rushed back to a room, but no seizure activity was formally visualized.  CT of the head and cervical spine had been obtained and showed no acute abnormality.  Daughter does note that the patient had been out of Depakote which she is on for bipolar disorder for 3 weeks as she was unable to find a place that will accept the patient's insurance.  She has been able to set her up with a appointment with [facility].  Ultimately she was started back on her Depakote and discharged home to follow-up in outpatient setting.  However, shortly after getting home patient was found in the closet and reported to have found down  in the closet and looked as though she may have just had a seizure or stroke.  The patient daughter notes that she previously has had seizures in the past, but not this many back-to-back.  The last that she can recall was approximately 4 years ago when the patient had been off of her Depakote.  Family notes that the patient does not drink alcohol or smoke cigarettes.  In the emergency department patient was noted to be afebrile with respirations 12-22, blood pressures 117/68 to 207/71, and all other vital signs maintained.  In the ED patient had 2 additional seizures 1 witnessed by the ED team .   Labs significant for WBC 12.1, potassium 3.4, CO2 15, BUN 12, creatinine 1.28, anion gap 16, and high-sensitivity troponin 9.  CT scan of the head noted no acute intercranial abnormality with a left forehead hematoma.  Neurology had been formally consulted and placed orders for long-term monitoring with EEG.  Patient was given 1 mg of Ativan IV, potassium chloride 20 mEq IV, and loaded with 1500mg  of Depakote IV.   Review of Systems: unable to review all systems due to the inability of the patient to answer questions. Past Medical History:  Diagnosis Date   Bipolar 1 disorder (HCC)    Depression    Hepatitis C    High cholesterol    Hypertension    Seizures (HCC)    UTI (urinary tract infection)    Past Surgical History:  Procedure Laterality Date  APPENDECTOMY     BREAST BIOPSY Right 03/04/2022   stereo bx, mass "X" clip-path pending   TOE AMPUTATION     Social History:  reports that she quit smoking about 2 years ago. Her smoking use included cigarettes. She started smoking about 32 years ago. She has a 30 pack-year smoking history. She has been exposed to tobacco smoke. She has never used smokeless tobacco. She reports that she does not drink alcohol and does not use drugs.  Allergies  Allergen Reactions   Tomato (Diagnostic) Other (See Comments)    Tongue break out.    Citrus Rash     Family History  Problem Relation Age of Onset   Mental illness Mother        born in mental hospital   Breast cancer Neg Hx     Prior to Admission medications   Medication Sig Start Date End Date Taking? Authorizing Provider  amLODipine (NORVASC) 5 MG tablet Take 1 tablet (5 mg total) by mouth daily. 08/07/23 08/06/24 Yes Delton Prairie, MD  divalproex (DEPAKOTE) 500 MG DR tablet Take 1 tablet (500 mg total) by mouth 2 (two) times daily. 08/07/23  Yes Delton Prairie, MD  magnesium gluconate (MAGONATE) 500 MG tablet Take 500 mg by mouth at bedtime.   Yes [provider]  metoprolol succinate (TOPROL-XL) 50 MG 24 hr tablet TAKE 1 TABLET BY MOUTH EVERY DAY 03/08/23  Yes Dugal, Tabitha, FNP  OLANZapine (ZYPREXA) 10 MG tablet Take 1 tablet (10 mg total) by mouth at bedtime. 08/07/23 08/06/24 Yes Delton Prairie, MD  potassium chloride (KLOR-CON M) 10 MEQ tablet Take 1 tablet (10 mEq total) by mouth daily. 07/31/22  Yes Sarina Ill, DO  traZODone (DESYREL) 50 MG tablet Take 1 tablet (50 mg total) by mouth at bedtime. Patient taking differently: Take 50 mg by mouth at bedtime as needed for sleep. 08/07/23  Yes Delton Prairie, MD  Vibegron (GEMTESA) 75 MG TABS Take 1 tablet (75 mg total) by mouth daily. 04/19/23  Yes MacDiarmid, Lorin Picket, MD  Mon[place] Fantasia INHUB 100-50 MCG/ACT AEPB INHALE 1 PUFF INTO THE LUNGS TWICE A DAY Patient taking differently: Inhale 1 puff into the lungs daily as needed (for shortness of breath). 03/15/23  Yes Dugal, Wyatt Mage, FNP  albuterol (VENTOLIN HFA) 108 (90 Base) MCG/ACT inhaler Inhale 2 puffs into the lungs every 4 (four) hours as needed for shortness of breath. Patient not taking: Reported on 08/08/2023 12/10/22   Mort Sawyers, FNP  Blood Pressure Monitoring (BLOOD PRESSURE CUFF) MISC 1 each by Does not apply route daily. 12/10/22   Mort Sawyers, FNP    Physical Exam: Vitals:   08/08/23 0615 08/08/23 0630 08/08/23 0645 08/08/23 0700  BP: 131/74 117/68 130/68 124/67   Pulse: 92 88 82 84  Resp: 18 16 20 18   Temp:      TempSrc:      SpO2: 97% 98% 100% 95%  Weight:      Height:      Constitutional: Obese elderly female currently in no acute distress Eyes: PERRL, lids and conjunctivae normal ENMT: Mucous membranes are moist. Posterior pharynx clear of any exudate or lesions.Normal dentition.  Neck: normal, supple, no masses, no thyromegaly Respiratory: clear to auscultation bilaterally, no wheezing, no crackles. Normal respiratory effort. No accessory muscle use.  Cardiovascular: Regular rate and rhythm, no murmurs / rubs / gallops. No extremity edema. 2+ pedal pulses. No carotid bruits.  Abdomen: no tenderness, no masses palpated. No hepatosplenomegaly. Bowel sounds positive.  Musculoskeletal: no  clubbing / cyanosis. No joint deformity upper and lower extremities. Good ROM, no contractures. Normal muscle tone.  Skin: no rashes, lesions, ulcers. No induration Neurologic: CN 2-12 grossly intact. Sensation intact, DTR normal. Strength 5/5 in all 4.  Psychiatric: Normal judgment and insight. Alert and oriented x 3. Normal mood.   Data Reviewed:  reviewed labs, imaging, and pertinent records as documented.  Assessment and Plan:  Suspected recurrent seizures Patient presented after having it was thought to be a seizure.  For possible seizures noted in the last day.  It seems she has been off of Depakote over the last 3 weeks due to inability to find a provider that will accept her insurance. -Admit to a progressive bed -Neurochecks -Follow-up long-term monitoring EEG -Continue home dose of Depakote 500 mg twice daily  -Normal saline IV fluids at 75 mL/h -Ativan IV as needed for seizure activity -Appreciate neurology consultative services, will follow-up for any further recommendations  Leukocytosis Acute.  WBC elevated at 12.1.  Patient has been otherwise afebrile.  Thought reactive to above -Add on lactic acid and procalcitonin -Check complete  respiratory virus panel -Check urinalysis -Check chest x-ray  Metabolic acidosis with increased anion gap CO2 was noted to be 15 with anion gap of 16.  Blood sugars were noted to be is mildly elevated at 152.  The setting of possible seizure question possibility of lactic acidosis as the cause for symptoms. -Continue to monitor  Hypokalemia Acute.  Initial potassium 3.4.  Patient had been given 20 mieq of potassium chloride IV. -Continue to monitor and replace as needed  Essential hypertension Blood pressures were initially elevated up to 207/71.  Currently blood pressures maintain 135/72. -Resume home blood pressure regimen -Hydralazine IV as needed  Chronic kidney disease stage IIIa Creatinine noted to be 1.28 with BUN 12.  Baseline creatinine appears to be around 1.1. -Check CK level -Follow-up urinalysis -Continue to monitor  COPD, without exacerbation No significant wheezes appreciated on physical exam at this time.  No formal pulmonary function studies available -Continue pharmacy substitution for home inhaler -Albuterol nebs as needed for shortness of breath/wheezing  Prediabetes Glucose was noted to be 152 on admission. Last hemoglobin A1c was 6.1 when checked on 04/15/2023. -Continue to monitor CBGs daily and consider need of starting sliding scale of insulin if warranted  Bipolar disorder Patient's daughter notes has follow-up with [facility]. -Continue Zyprexa and Depakote -Keep outpatient follow-up with [facility]  Obesity BMI 35.57 kg/m  DVT prophylaxis: Lovenox Advance Care Planning:   Code Status: Full Code    Consults: Neurology  Family Communication: Family updated at bedside  Severity of Illness: The appropriate patient status for this patient is INPATIENT. Inpatient status is judged to be reasonable and necessary in order to provide the required intensity of service to ensure the patient's safety. The patient's presenting symptoms, physical exam findings,  and initial radiographic and laboratory data in the context of their chronic comorbidities is felt to place them at high risk for further clinical deterioration. Furthermore, it is not anticipated that the patient will be medically stable for discharge from the hospital within 2 midnights of admission.   * I certify that at the point of admission it is my clinical judgment that the patient will require inpatient hospital care spanning beyond 2 midnights from the point of admission due to high intensity of service, high risk for further deterioration and high frequency of surveillance required.*  Author: Clydie Braun, MD 08/08/2023 7:34 AM  For  on call review [email or web address].

## 2023-08-08 NOTE — ED Notes (Signed)
Multiple attempts made to contact EEG regarding patient transport to floor. No answer at this time.

## 2023-08-08 NOTE — ED Notes (Signed)
More attempts made at calling EEG for pt transport to floor. Charge nurse notified of situation.

## 2023-08-09 ENCOUNTER — Other Ambulatory Visit (HOSPITAL_COMMUNITY): Payer: Self-pay

## 2023-08-09 DIAGNOSIS — E872 Acidosis, unspecified: Secondary | ICD-10-CM | POA: Diagnosis not present

## 2023-08-09 DIAGNOSIS — R569 Unspecified convulsions: Secondary | ICD-10-CM | POA: Diagnosis not present

## 2023-08-09 DIAGNOSIS — I10 Essential (primary) hypertension: Secondary | ICD-10-CM | POA: Diagnosis not present

## 2023-08-09 DIAGNOSIS — N1831 Chronic kidney disease, stage 3a: Secondary | ICD-10-CM | POA: Diagnosis not present

## 2023-08-09 DIAGNOSIS — D72829 Elevated white blood cell count, unspecified: Secondary | ICD-10-CM | POA: Diagnosis not present

## 2023-08-09 LAB — BASIC METABOLIC PANEL
Anion gap: 12 (ref 5–15)
BUN: 12 mg/dL (ref 8–23)
CO2: 24 mmol/L (ref 22–32)
Calcium: 8.9 mg/dL (ref 8.9–10.3)
Chloride: 103 mmol/L (ref 98–111)
Creatinine, Ser: 1.26 mg/dL — ABNORMAL HIGH (ref 0.44–1.00)
GFR, Estimated: 45 mL/min — ABNORMAL LOW (ref >60)
Glucose, Bld: 101 mg/dL — ABNORMAL HIGH (ref 70–99)
Potassium: 4 mmol/L (ref 3.5–5.1)
Sodium: 139 mmol/L (ref 135–145)

## 2023-08-09 LAB — CBC
HCT: 36 % (ref 36.0–46.0)
Hemoglobin: 12 g/dL (ref 12.0–15.0)
MCH: 29.4 pg (ref 26.0–34.0)
MCHC: 33.3 g/dL (ref 30.0–36.0)
MCV: 88.2 fL (ref 80.0–100.0)
Platelets: 263 10*3/uL (ref 150–400)
RBC: 4.08 MIL/uL (ref 3.87–5.11)
RDW: 14.8 % (ref 11.5–15.5)
WBC: 6 10*3/uL (ref 4.0–10.5)
nRBC: 0.3 % — ABNORMAL HIGH (ref 0.0–0.2)

## 2023-08-09 MED ORDER — TRAZODONE HCL 50 MG PO TABS
50.0000 mg | ORAL_TABLET | Freq: Every evening | ORAL | 2 refills | Status: DC | PRN
  Filled 2023-08-09: qty 30, 30d supply, fill #0

## 2023-08-09 MED ORDER — ALBUTEROL SULFATE HFA 108 (90 BASE) MCG/ACT IN AERS
2.0000 | INHALATION_SPRAY | RESPIRATORY_TRACT | 0 refills | Status: DC | PRN
  Filled 2023-08-09: qty 18, 17d supply, fill #0

## 2023-08-09 MED ORDER — DIVALPROEX SODIUM 250 MG PO DR TAB
500.0000 mg | DELAYED_RELEASE_TABLET | Freq: Two times a day (BID) | ORAL | 2 refills | Status: DC
  Filled 2023-08-09: qty 120, 30d supply, fill #0

## 2023-08-09 MED ORDER — OLANZAPINE 10 MG PO TABS
10.0000 mg | ORAL_TABLET | Freq: Every day | ORAL | 2 refills | Status: DC
  Filled 2023-08-09: qty 30, 30d supply, fill #0

## 2023-08-09 MED ORDER — POTASSIUM CHLORIDE CRYS ER 10 MEQ PO TBCR
10.0000 meq | EXTENDED_RELEASE_TABLET | Freq: Every day | ORAL | 0 refills | Status: DC
  Filled 2023-08-09: qty 30, 30d supply, fill #0

## 2023-08-09 MED ORDER — GEMTESA 75 MG PO TABS
1.0000 | ORAL_TABLET | Freq: Every day | ORAL | 2 refills | Status: DC
  Filled 2023-08-09: qty 30, 30d supply, fill #0

## 2023-08-09 NOTE — Progress Notes (Signed)
PROGRESS NOTE        PATIENT DETAILS Name: Shelby Jordan Age: 73 y.o. Sex: female Date of Birth: 03-17-1951 Admit Date: 08/08/2023 Admitting Physician Clydie Braun, MD [id number], Wyatt Mage, FNP  Brief Summary: Patient is a 73 y.o.  female with a history of HTN, HLD, bipolar disorder, seizure disorder, CKD stage IIIa who presented with possible breakthrough seizures-admitted for further evaluation including LTM EEG.  Significant events: 1/19>> admit to [facility]  Significant studies: 1/18>> CT head: No acute endocrine abnormality 1/18>> CT C-spine: No fracture 1/19>> CT head: No acute intracranial abnormality. 1/19>> CXR: No pneumonia-suggestive of acute bronchitis.  Significant microbiology data: 1/19>> respiratory virus panel: Negative 1/19>> COVID PCR: Negative  Procedures: None  Consults: Neurology  Subjective: Lying comfortably in bed-denies any chest pain or shortness of breath.  Objective: Vitals: Blood pressure 109/68, pulse 76, temperature 98.2 F (36.8 C), temperature source Oral, resp. rate 18, height 5\' 4"  (1.626 m), weight 94 kg, SpO2 99%.   Exam: Gen Exam:Alert awake-not in any distress HEENT:atraumatic, normocephalic Chest: B/L clear to auscultation anteriorly CVS:S1S2 regular Abdomen:soft non tender, non distended Extremities:no edema Neurology: Non focal Skin: no rash  Pertinent Labs/Radiology:    Latest Ref Rng & Units 08/09/2023    3:42 AM 08/08/2023    3:11 AM 08/07/2023    9:55 PM  CBC  WBC 4.0 - 10.5 K/uL 6.0  12.1  6.7   Hemoglobin 12.0 - 15.0 g/dL 86.5  78.4  69.6   Hematocrit 36.0 - 46.0 % 36.0  42.4  40.9   Platelets 150 - 400 K/uL 263  300  284     Lab Results  Component Value Date   NA 139 08/09/2023   K 4.0 08/09/2023   CL 103 08/09/2023   CO2 24 08/09/2023      Assessment/Plan: Suspected breakthrough seizures Continue Depakote LTM EEG in progress Neurology following-await further  recommendations  Leukocytosis Afebrile-likely reactive in the setting of seizures. Leukocytosis resolved-no setting/clinical features of infection.  Anion gap metabolic acidosis Probably secondary to lactic acidosis-in the setting of seizures.  CKD stage IIIa At baseline  HTN BP stable Continue amlodipine/metoprolol  COPD Not in exacerbation Bronchodilators  Bipolar disorder Appears stable Depakote/Zyprexa Keep next appointment with psychiatry  Class 1 Obesity: Estimated body mass index is 35.57 kg/m as calculated from the following:   Height as of this encounter: 5\' 4"  (1.626 m).   Weight as of this encounter: 94 kg.   Code status:   Code Status: Full Code   DVT Prophylaxis: enoxaparin (LOVENOX) injection 40 mg Start: 08/08/23 1800   Family Communication: None at bedside   Disposition Plan: Status is: Inpatient Remains inpatient appropriate because: Severity of illness   Planned Discharge Destination:Home   Diet: Diet Order             Diet Heart Room service appropriate? Yes; Fluid consistency: Thin  Diet effective now                     Antimicrobial agents: Anti-infectives (From admission, onward)    None        MEDICATIONS: Scheduled Meds:  amLODipine  5 mg Oral Daily   divalproex  500 mg Oral BID   enoxaparin (LOVENOX) injection  40 mg Subcutaneous Q24H   magnesium gluconate  500 mg Oral QHS   metoprolol succinate  50 mg Oral Daily   mirabegron ER  25 mg Oral Daily   mometasone-formoterol  2 puff Inhalation BID   OLANZapine  10 mg Oral QHS   potassium chloride  10 mEq Oral Daily   sodium chloride flush  3 mL Intravenous Q12H   Continuous Infusions:  sodium chloride 75 mL/hr at 08/08/23 2240   PRN Meds:.acetaminophen **OR** acetaminophen, albuterol, hydrALAZINE, LORazepam, ondansetron **OR** ondansetron (ZOFRAN) IV, traZODone   I have personally reviewed following labs and imaging studies  LABORATORY DATA: CBC: Recent  Labs  Lab 08/07/23 2155 08/08/23 0311 08/09/23 0342  WBC 6.7 12.1* 6.0  NEUTROABS 5.0 8.1*  --   HGB 13.6 13.6 12.0  HCT 40.9 42.4 36.0  MCV 86.8 91.6 88.2  PLT 284 300 263    Basic Metabolic Panel: Recent Labs  Lab 08/07/23 2155 08/08/23 0311 08/09/23 0342  NA 139 137 139  K 4.1 3.4* 4.0  CL 103 106 103  CO2 25 15* 24  GLUCOSE 111* 152* 101*  BUN 14 12 12   CREATININE 1.13* 1.28* 1.26*  CALCIUM 9.8 9.7 8.9  MG  --  2.0  --     GFR: Estimated Creatinine Clearance: 44.9 mL/min (A) (by C-G formula based on SCr of 1.26 mg/dL (H)).  Liver Function Tests: Recent Labs  Lab 08/08/23 0311  AST 24  ALT 19  ALKPHOS 45  BILITOT 0.7  PROT 7.3  ALBUMIN 4.3   No results for input(s): "LIPASE", "AMYLASE" in the last 168 hours. No results for input(s): "AMMONIA" in the last 168 hours.  Coagulation Profile: No results for input(s): "INR", "PROTIME" in the last 168 hours.  Cardiac Enzymes: Recent Labs  Lab 08/08/23 0348  CKTOTAL 64    BNP (last 3 results) No results for input(s): "PROBNP" in the last 8760 hours.  Lipid Profile: No results for input(s): "CHOL", "HDL", "LDLCALC", "TRIG", "CHOLHDL", "LDLDIRECT" in the last 72 hours.  Thyroid Function Tests: No results for input(s): "TSH", "T4TOTAL", "FREET4", "T3FREE", "THYROIDAB" in the last 72 hours.  Anemia Panel: No results for input(s): "VITAMINB12", "FOLATE", "FERRITIN", "TIBC", "IRON", "RETICCTPCT" in the last 72 hours.  Urine analysis:    Component Value Date/Time   COLORURINE YELLOW 12/11/2022 1612   APPEARANCEUR Clear 06/07/2023 1335   LABSPEC 1.019 12/11/2022 1612   PHURINE 6.0 12/11/2022 1612   GLUCOSEU Negative 06/07/2023 1335   GLUCOSEU NEGATIVE 07/16/2022 1459   HGBUR NEGATIVE 12/11/2022 1612   BILIRUBINUR Negative 06/07/2023 1335   KETONESUR NEGATIVE 12/11/2022 1612   PROTEINUR Negative 06/07/2023 1335   PROTEINUR 1+ (A) 12/11/2022 1612   UROBILINOGEN 0.2 02/11/2023 1440   UROBILINOGEN 0.2  07/16/2022 1459   NITRITE Negative 06/07/2023 1335   NITRITE NEGATIVE 07/16/2022 1459   LEUKOCYTESUR Negative 06/07/2023 1335   LEUKOCYTESUR NEGATIVE 07/16/2022 1459    Sepsis Labs: Lactic Acid, Venous No results found for: "LATICACIDVEN"  MICROBIOLOGY: Recent Results (from the past 240 hours)  Respiratory (~20 pathogens) panel by PCR     Status: None   Collection Time: 08/08/23  8:51 AM   Specimen: Nasopharyngeal Swab; Respiratory  Result Value Ref Range Status   Adenovirus NOT DETECTED NOT DETECTED Final   Coronavirus 229E NOT DETECTED NOT DETECTED Final    Comment: (NOTE) The Coronavirus on the Respiratory Panel, DOES NOT test for the novel  Coronavirus (2019 nCoV)    Coronavirus HKU1 NOT DETECTED NOT DETECTED Final   Coronavirus NL63 NOT DETECTED NOT DETECTED Final   Coronavirus OC43 NOT DETECTED NOT DETECTED Final  Metapneumovirus NOT DETECTED NOT DETECTED Final   Rhinovirus / Enterovirus NOT DETECTED NOT DETECTED Final   Influenza A NOT DETECTED NOT DETECTED Final   Influenza B NOT DETECTED NOT DETECTED Final   Parainfluenza Virus 1 NOT DETECTED NOT DETECTED Final   Parainfluenza Virus 2 NOT DETECTED NOT DETECTED Final   Parainfluenza Virus 3 NOT DETECTED NOT DETECTED Final   Parainfluenza Virus 4 NOT DETECTED NOT DETECTED Final   Respiratory Syncytial Virus NOT DETECTED NOT DETECTED Final   Bordetella pertussis NOT DETECTED NOT DETECTED Final   Bordetella Parapertussis NOT DETECTED NOT DETECTED Final   Chlamydophila pneumoniae NOT DETECTED NOT DETECTED Final   Mycoplasma pneumoniae NOT DETECTED NOT DETECTED Final    Comment: Performed at [facility], [address], [place], [place] [address]  SARS Coronavirus 2 by RT PCR (hospital order, performed in [facility] hospital lab) *cepheid single result test* Anterior Nasal Swab     Status: None   Collection Time: 08/08/23  8:51 AM   Specimen: Anterior Nasal Swab  Result Value Ref Range Status   SARS Coronavirus  2 by RT PCR NEGATIVE NEGATIVE Final    Comment: Performed at [facility], [address], [place], [place] [address]    RADIOLOGY STUDIES/RESULTS: DG CHEST PORT 1 VIEW Result Date: 08/08/2023 CLINICAL DATA:  73 year old female with seizure-like activity. EXAM: PORTABLE CHEST 1 VIEW COMPARISON:  Chest x-ray 04/15/2023. FINDINGS: Lung volumes are normal. No consolidative airspace disease. Diffuse interstitial prominence and widespread peribronchial cuffing. No pleural effusions. No pneumothorax. No evidence of pulmonary edema. Heart size is normal. The patient is rotated to the left on today's exam, resulting in distortion of the mediastinal contours and reduced diagnostic sensitivity and specificity for mediastinal pathology. IMPRESSION: 1. The appearance of the chest suggests an acute bronchitis. Electronically Signed   By: Trudie Reed M.D.   On: 08/08/2023 08:25   CT Head Wo Contrast Result Date: 08/08/2023 CLINICAL DATA:  New onset seizure. EXAM: CT HEAD WITHOUT CONTRAST TECHNIQUE: Contiguous axial images were obtained from the base of the skull through the vertex without intravenous contrast. RADIATION DOSE REDUCTION: This exam was performed according to the departmental dose-optimization program which includes automated exposure control, adjustment of the mA and/or kV according to patient size and/or use of iterative reconstruction technique. COMPARISON:  CT head 08/07/2023 FINDINGS: Brain: No intracranial hemorrhage, mass effect, or evidence of acute infarct. No hydrocephalus. No extra-axial fluid collection. Age-commensurate cerebral atrophy and chronic small vessel ischemic disease. 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 Chronic left cerebellar infarct. Vascular: No hyperdense vessel. Intracranial arterial calcification. Skull: No fracture or focal lesion.  Left forehead hematoma. Sinuses/Orbits: No acute finding. Other: None. IMPRESSION: 1. No  acute intracranial abnormality. 2. Left forehead hematoma. No skull fracture. Electronically Signed   By: Minerva Fester M.D.   On: 08/08/2023 03:56   CT HEAD WO CONTRAST ( ) Result Date: 08/07/2023 CLINICAL DATA:  Mechanical fall.  Laceration to middle of forehead. EXAM: CT HEAD WITHOUT CONTRAST CT CERVICAL SPINE WITHOUT CONTRAST TECHNIQUE: Multidetector CT imaging of the head and cervical spine was performed following the standard protocol without intravenous contrast. Multiplanar CT image reconstructions of the cervical spine were also generated. RADIATION DOSE REDUCTION: This exam was performed according to the departmental dose-optimization program which includes automated exposure control, adjustment of the mA and/or kV according to patient size and/or use of iterative reconstruction technique. COMPARISON:  None Available. FINDINGS: CT HEAD FINDINGS Brain:  No intracranial hemorrhage, mass effect, or evidence of acute infarct. No hydrocephalus. No extra-axial fluid collection. Mild cerebral atrophy and chronic small vessel ischemic disease. 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 Vascular: No hyperdense vessel. Intracranial arterial calcification. Skull: No fracture or focal lesion.  Left forehead scalp hematoma. Sinuses/Orbits: No acute finding. Other: None. CT CERVICAL SPINE FINDINGS Alignment: No evidence of traumatic malalignment. Skull base and vertebrae: No acute fracture. No primary bone lesion or focal pathologic process. Soft tissues and spinal canal: No prevertebral fluid or swelling. No visible canal hematoma. Disc levels: Multilevel spondylosis, disc space height loss, and degenerative endplate changes greatest at C5-C6 where it is moderate. No severe spinal canal narrowing. Upper chest: No acute abnormality. Other: None. IMPRESSION: 1. No acute intracranial abnormality. 2. No acute fracture in the cervical spine. Electronically Signed   By: Minerva Fester  M.D.   On: 08/07/2023 22:22   CT Cervical Spine Wo Contrast Result Date: 08/07/2023 CLINICAL DATA:  Mechanical fall.  Laceration to middle of forehead. EXAM: CT HEAD WITHOUT CONTRAST CT CERVICAL SPINE WITHOUT CONTRAST TECHNIQUE: Multidetector CT imaging of the head and cervical spine was performed following the standard protocol without intravenous contrast. Multiplanar CT image reconstructions of the cervical spine were also generated. RADIATION DOSE REDUCTION: This exam was performed according to the departmental dose-optimization program which includes automated exposure control, adjustment of the mA and/or kV according to patient size and/or use of iterative reconstruction technique. COMPARISON:  None Available. FINDINGS: CT HEAD FINDINGS Brain: No intracranial hemorrhage, mass effect, or evidence of acute infarct. No hydrocephalus. No extra-axial fluid collection. Mild cerebral atrophy and chronic small vessel ischemic disease. 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 Vascular: No hyperdense vessel. Intracranial arterial calcification. Skull: No fracture or focal lesion.  Left forehead scalp hematoma. Sinuses/Orbits: No acute finding. Other: None. CT CERVICAL SPINE FINDINGS Alignment: No evidence of traumatic malalignment. Skull base and vertebrae: No acute fracture. No primary bone lesion or focal pathologic process. Soft tissues and spinal canal: No prevertebral fluid or swelling. No visible canal hematoma. Disc levels: Multilevel spondylosis, disc space height loss, and degenerative endplate changes greatest at C5-C6 where it is moderate. No severe spinal canal narrowing. Upper chest: No acute abnormality. Other: None. IMPRESSION: 1. No acute intracranial abnormality. 2. No acute fracture in the cervical spine. Electronically Signed   By: Minerva Fester M.D.   On: 08/07/2023 22:22     LOS: 1 day   Jeoffrey Massed, MD  [facility]    To contact the  attending provider between 7A-7P or the covering provider during after hours 7P-7A, please log into the web site [email or web address] and access using universal [place] password for that web site. If you do not have the password, please call the hospital operator.  08/09/2023, 8:29 AM

## 2023-08-09 NOTE — Progress Notes (Signed)
Patient ambulated with 1 person assist to the bathroom using a front wheel walker. Patient noted to be very unsteady with occasional swaying. RN notified MD, discharge discontinued. Physical therapy evaluation ordered.

## 2023-08-09 NOTE — Plan of Care (Signed)

## 2023-08-09 NOTE — Discharge Summary (Signed)
PATIENT DETAILS Name: Shelby Jordan Age: 73 y.o. Sex: female Date of Birth: 1951/04/11 MRN: [id number]. Admitting Physician: Clydie Braun, MD AOZ:[id number], Wyatt Mage, FNP  Admit Date: 08/08/2023 Discharge date: 08/09/2023  Recommendations for Outpatient Follow-up:  Follow up with PCP in 1-2 weeks Please obtain CMP/CBC in one week Please ensure follow-up with neurology  Admitted From:  Home  Disposition: Home   Discharge Condition: good  CODE STATUS:   Code Status: Full Code   Diet recommendation:  Diet Order             Diet - low sodium heart healthy           Diet Heart Room service appropriate? Yes; Fluid consistency: Thin  Diet effective now                    Brief Summary: Patient is a 73 y.o.  female with a history of HTN, HLD, bipolar disorder, seizure disorder, CKD stage IIIa who presented with possible breakthrough seizures-admitted for further evaluation including LTM EEG.   Significant events: 1/19>> admit to [facility]   Significant studies: 1/18>> CT head: No acute endocrine abnormality 1/18>> CT C-spine: No fracture 1/19>> CT head: No acute intracranial abnormality. 1/19>> CXR: No pneumonia-suggestive of acute bronchitis.   Significant microbiology data: 1/19>> respiratory virus panel: Negative 1/19>> COVID PCR: Negative   Procedures: None   Consults: Neurology  Brief Hospital Course: Suspected breakthrough seizures Secondary to noncompliance with medications-recently ran out of Depakote Restarted on Depakote-LTM EEG was negative for seizures Per neurology-okay to discharge-continue current dosing of Depakote Follow-up with outpatient neurology-epic referral has been sent.  Patient and daughter both aware of driving restrictions and seizure precautions.   Leukocytosis Afebrile-likely reactive in the setting of seizures. Leukocytosis resolved-no setting/clinical features of infection.   Anion gap metabolic acidosis Probably  secondary to lactic acidosis-in the setting of seizures.   CKD stage IIIa At baseline   HTN BP stable Continue amlodipine/metoprolol   COPD Not in exacerbation Bronchodilators   Bipolar disorder Appears stable Depakote/Zyprexa Keep next appointment with psychiatry   Class 1 Obesity: Estimated body mass index is 35.57 kg/m as calculated from the following:   Height as of this encounter: 5\' 4"  (1.626 m).   Weight as of this encounter: 94 kg.   Discharge Diagnoses:  Principal Problem:   Seizure (HCC) Active Problems:   Leukocytosis   Metabolic acidosis with increased anion gap and accumulation of organic acids   Hypokalemia   Essential hypertension   Chronic kidney disease, stage 3a (HCC)   Bipolar 1 disorder (HCC)   Obesity (BMI 35.0-39.9 without comorbidity)   Discharge Instructions:  Activity:  As tolerated   Discharge Instructions     Ambulatory referral to Neurology   Complete by: As directed    An appointment is requested in approximately: 4 weeks - 8 weeks   Diet - low sodium heart healthy   Complete by: As directed    Discharge instructions   Complete by: As directed    Follow with Primary MD  Mort Sawyers, FNP in 1-2 weeks  Follow with neurology-the office will call you with a follow-up appointment.  Please get a complete blood count and chemistry panel checked by your Primary MD at your next visit, and again as instructed by your Primary MD.  Get Medicines reviewed and adjusted: Please take all your medications with you for your next visit with your Primary MD  Laboratory/radiological data: Please request your Primary MD to  go over all hospital tests and procedure/radiological results at the follow up, please ask your Primary MD to get all Hospital records sent to his/her office.  In some cases, they will be blood work, cultures and biopsy results pending at the time of your discharge. Please request that your primary care M.D. follows up on  these results.  Also Note the following: If you experience worsening of your admission symptoms, develop shortness of breath, life threatening emergency, suicidal or homicidal thoughts you must seek medical attention immediately by calling 911 or calling your MD immediately  if symptoms less severe.  You must read complete instructions/literature along with all the possible adverse reactions/side effects for all the Medicines you take and that have been prescribed to you. Take any new Medicines after you have completely understood and accpet all the possible adverse reactions/side effects.   Do not drive when taking Pain medications or sleeping medications (Benzodaizepines)  Do not take more than prescribed Pain, Sleep and Anxiety Medications. It is not advisable to combine anxiety,sleep and pain medications without talking with your primary care practitioner  Special Instructions: If you have smoked or chewed Tobacco  in the last 2 yrs please stop smoking, stop any regular Alcohol  and or any Recreational drug use.  Wear Seat belts while driving.  Please note: You were cared for by a hospitalist during your hospital stay. Once you are discharged, your primary care physician will handle any further medical issues. Please note that NO REFILLS for any discharge medications will be authorized once you are discharged, as it is imperative that you return to your primary care physician (or establish a relationship with a primary care physician if you do not have one) for your post hospital discharge needs so that they can reassess your need for medications and monitor your lab values.     Seizure precautions: Per [facility] statutes, patients with seizures are not allowed to drive until they have been seizure-free for six months and cleared by a physician    Use caution when using heavy equipment or power tools. Avoid working on ladders or at heights. Take showers instead of baths. Ensure the  water temperature is not too high on the home water heater. Do not go swimming alone. Do not lock yourself in a room alone (i.e. bathroom). When caring for infants or small children, sit down when holding, feeding, or changing them to minimize risk of injury to the child in the event you have a seizure. Maintain good sleep hygiene. Avoid alcohol.    If patient has another seizure, call 911 and bring them back to the ED if: A.  The seizure lasts longer than 5 minutes.      B.  The patient doesn't wake shortly after the seizure or has new problems such as difficulty seeing, speaking or moving following the seizure C.  The patient was injured during the seizure D.  The patient has a temperature over 102 F (39C) E.  The patient vomited during the seizure and now is having trouble breathing    During the Seizure   - First, ensure adequate ventilation and place patients on the floor on their left side  Loosen clothing around the neck and ensure the airway is patent. If the patient is clenching the teeth, do not force the mouth open with any object as this can cause severe damage - Remove all items from the surrounding that can be hazardous. The patient may be oblivious to  what's happening and may not even know what he or she is doing. If the patient is confused and wandering, either gently guide him/her away and block access to outside areas - Reassure the individual and be comforting - Call 911. In most cases, the seizure ends before EMS arrives. However, there are cases when seizures may last over 3 to 5 minutes. Or the individual may have developed breathing difficulties or severe injuries. If a pregnant patient or a person with diabetes develops a seizure, it is prudent to call an ambulance. - Finally, if the patient does not regain full consciousness, then call EMS. Most patients will remain confused for about 45 to 90 minutes after a seizure, so you must use judgment in calling for help. - Avoid  restraints but make sure the patient is in a bed with padded side rails - Place the individual in a lateral position with the neck slightly flexed; this will help the saliva drain from the mouth and prevent the tongue from falling backward - Remove all nearby furniture and other hazards from the area - Provide verbal assurance as the individual is regaining consciousness - Provide the patient with privacy if possible - Call for help and start treatment as ordered by the caregiver    After the Seizure (Postictal Stage)   After a seizure, most patients experience confusion, fatigue, muscle pain and/or a headache. Thus, one should permit the individual to sleep. For the next few days, reassurance is essential. Being calm and helping reorient the person is also of importance.   Most seizures are painless and end spontaneously. Seizures are not harmful to others but can lead to complications such as stress on the lungs, brain and the heart. Individuals with prior lung problems may develop labored breathing and respiratory distress.    Increase activity slowly   Complete by: As directed       Allergies as of 08/09/2023       Reactions   Tomato (diagnostic) Other (See Comments)   Tongue break out.    Citrus Rash        Medication List     TAKE these medications    albuterol 108 (90 Base) MCG/ACT inhaler Commonly known as: VENTOLIN HFA Inhale 2 puffs into the lungs every 4 (four) hours as needed for shortness of breath.   amLODipine 5 MG tablet Commonly known as: NORVASC Take 1 tablet (5 mg total) by mouth daily.   Blood Pressure Cuff Misc 1 each by Does not apply route daily.   divalproex 500 MG DR tablet Commonly known as: Depakote Take 1 tablet (500 mg total) by mouth 2 (two) times daily.   Gemtesa 75 MG Tabs Generic drug: Vibegron Take 1 tablet (75 mg total) by mouth daily.   magnesium gluconate 500 MG tablet Commonly known as: MAGONATE Take 500 mg by mouth at  bedtime.   metoprolol succinate 50 MG 24 hr tablet Commonly known as: TOPROL-XL TAKE 1 TABLET BY MOUTH EVERY DAY   OLANZapine 10 MG tablet Commonly known as: ZyPREXA Take 1 tablet (10 mg total) by mouth at bedtime.   potassium chloride 10 MEQ tablet Commonly known as: KLOR-CON M Take 1 tablet (10 mEq total) by mouth daily.   traZODone 50 MG tablet Commonly known as: DESYREL Take 1 tablet (50 mg total) by mouth at bedtime as needed for sleep.   Wixela Inhub 100-50 MCG/ACT Aepb Generic drug: fluticasone-salmeterol INHALE 1 PUFF INTO THE LUNGS TWICE A DAY What changed: See  the new instructions.        Follow-up Information     Mort Sawyers, FNP. Schedule an appointment as soon as possible for a visit in 1 week(s).   Specialty: Family Medicine Contact information: [address] [place] [address] [phone]         [facility] Follow up.   Specialty: Neurology Why: Office will call with date/time, If you dont hear from them,please give them a call Contact information: [address] [place] [address] [phone]               Allergies  Allergen Reactions   Tomato (Diagnostic) Other (See Comments)    Tongue break out.    Citrus Rash     Other Procedures/Studies: Overnight EEG with video Result Date: 08/09/2023 Charlsie Quest, MD     08/09/2023 10:04 AM Patient Name: Shelby Jordan MRN: [id number] Epilepsy Attending: Charlsie Quest Referring Physician/Provider: Erick Blinks, MD Duration: 08/08/2023 5284 to 08/09/2023 1324 Patient history: 73 y.o. female with hx of depression, Bipolar 1, Hep C, HTN, who presents with multiple episodes concerning for seizures after running out of Depakote a month ago which she uses for mood. EEG to evaluate for seizure Level of alertness: Awake, asleep AEDs during EEG study: VPA Technical aspects: This EEG study was done with scalp electrodes  positioned according to the 10-20 International system of electrode placement. Electrical activity was reviewed with band pass filter of 1-70Hz , sensitivity of 7 uV/mm, display speed of 16mm/sec with a 60Hz  notched filter applied as appropriate. EEG data were recorded continuously and digitally stored.  Video monitoring was available and reviewed as appropriate. Description: The posterior dominant rhythm consists of 8-9 Hz activity of moderate voltage (25-35 uV) seen predominantly in posterior head regions, symmetric and reactive to eye opening and eye closing. Sleep was characterized by vertex waves, sleep spindles (12 to 14 Hz), maximal frontocentral region.  Hyperventilation and photic stimulation were not performed.   IMPRESSION: This study is within normal limits. No seizures or epileptiform discharges were seen throughout the recording. A normal interictal EEG does not exclude the diagnosis of epilepsy. Charlsie Quest   DG CHEST PORT 1 VIEW Result Date: 08/08/2023 CLINICAL DATA:  73 year old female with seizure-like activity. EXAM: PORTABLE CHEST 1 VIEW COMPARISON:  Chest x-ray 04/15/2023. FINDINGS: Lung volumes are normal. No consolidative airspace disease. Diffuse interstitial prominence and widespread peribronchial cuffing. No pleural effusions. No pneumothorax. No evidence of pulmonary edema. Heart size is normal. The patient is rotated to the left on today's exam, resulting in distortion of the mediastinal contours and reduced diagnostic sensitivity and specificity for mediastinal pathology. IMPRESSION: 1. The appearance of the chest suggests an acute bronchitis. Electronically Signed   By: Trudie Reed M.D.   On: 08/08/2023 08:25   CT Head Wo Contrast Result Date: 08/08/2023 CLINICAL DATA:  New onset seizure. EXAM: CT HEAD WITHOUT CONTRAST TECHNIQUE: Contiguous axial images were obtained from the base of the skull through the vertex without intravenous contrast. RADIATION DOSE REDUCTION: This  exam was performed according to the departmental dose-optimization program which includes automated exposure control, adjustment of the mA and/or kV according to patient size and/or use of iterative reconstruction technique. COMPARISON:  CT head 08/07/2023 FINDINGS: Brain: No intracranial hemorrhage, mass effect, or evidence of acute infarct. No hydrocephalus. No extra-axial fluid collection. Age-commensurate cerebral atrophy and chronic small vessel ischemic disease. Chronic infarct or prominent perivascular space in the  bilateral basal ganglia. Chronic left cerebellar infarct. Vascular: No hyperdense vessel. Intracranial arterial calcification. Skull: No fracture or focal lesion.  Left forehead hematoma. Sinuses/Orbits: No acute finding. Other: None. IMPRESSION: 1. No acute intracranial abnormality. 2. Left forehead hematoma. No skull fracture. Electronically Signed   By: Minerva Fester M.D.   On: 08/08/2023 03:56   CT HEAD WO CONTRAST ( ) Result Date: 08/07/2023 CLINICAL DATA:  Mechanical fall.  Laceration to middle of forehead. EXAM: CT HEAD WITHOUT CONTRAST CT CERVICAL SPINE WITHOUT CONTRAST TECHNIQUE: Multidetector CT imaging of the head and cervical spine was performed following the standard protocol without intravenous contrast. Multiplanar CT image reconstructions of the cervical spine were also generated. RADIATION DOSE REDUCTION: This exam was performed according to the departmental dose-optimization program which includes automated exposure control, adjustment of the mA and/or kV according to patient size and/or use of iterative reconstruction technique. COMPARISON:  None Available. FINDINGS: CT HEAD FINDINGS Brain: No intracranial hemorrhage, mass effect, or evidence of acute infarct. No hydrocephalus. No extra-axial fluid collection. Mild cerebral atrophy and chronic small vessel ischemic disease. 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 Vascular: No hyperdense  vessel. Intracranial arterial calcification. Skull: No fracture or focal lesion.  Left forehead scalp hematoma. Sinuses/Orbits: No acute finding. Other: None. CT CERVICAL SPINE FINDINGS Alignment: No evidence of traumatic malalignment. Skull base and vertebrae: No acute fracture. No primary bone lesion or focal pathologic process. Soft tissues and spinal canal: No prevertebral fluid or swelling. No visible canal hematoma. Disc levels: Multilevel spondylosis, disc space height loss, and degenerative endplate changes greatest at C5-C6 where it is moderate. No severe spinal canal narrowing. Upper chest: No acute abnormality. Other: None. IMPRESSION: 1. No acute intracranial abnormality. 2. No acute fracture in the cervical spine. Electronically Signed   By: Minerva Fester M.D.   On: 08/07/2023 22:22   CT Cervical Spine Wo Contrast Result Date: 08/07/2023 CLINICAL DATA:  Mechanical fall.  Laceration to middle of forehead. EXAM: CT HEAD WITHOUT CONTRAST CT CERVICAL SPINE WITHOUT CONTRAST TECHNIQUE: Multidetector CT imaging of the head and cervical spine was performed following the standard protocol without intravenous contrast. Multiplanar CT image reconstructions of the cervical spine were also generated. RADIATION DOSE REDUCTION: This exam was performed according to the departmental dose-optimization program which includes automated exposure control, adjustment of the mA and/or kV according to patient size and/or use of iterative reconstruction technique. COMPARISON:  None Available. FINDINGS: CT HEAD FINDINGS Brain: No intracranial hemorrhage, mass effect, or evidence of acute infarct. No hydrocephalus. No extra-axial fluid collection. Mild cerebral atrophy and chronic small vessel ischemic disease. 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 Vascular: No hyperdense vessel. Intracranial arterial calcification. Skull: No fracture or focal lesion.  Left forehead scalp hematoma.  Sinuses/Orbits: No acute finding. Other: None. CT CERVICAL SPINE FINDINGS Alignment: No evidence of traumatic malalignment. Skull base and vertebrae: No acute fracture. No primary bone lesion or focal pathologic process. Soft tissues and spinal canal: No prevertebral fluid or swelling. No visible canal hematoma. Disc levels: Multilevel spondylosis, disc space height loss, and degenerative endplate changes greatest at C5-C6 where it is moderate. No severe spinal canal narrowing. Upper chest: No acute abnormality. Other: None. IMPRESSION: 1. No acute intracranial abnormality. 2. No acute fracture in the cervical spine. Electronically Signed   By: Minerva Fester M.D.   On: 08/07/2023 22:22     TODAY-DAY OF DISCHARGE:  Subjective:   Sharda Elizarraraz today has no headache,no  chest abdominal pain,no new weakness tingling or numbness, feels much better wants to go home today.   Objective:   Blood pressure (!) 116/52, pulse (!) 57, temperature 98 F (36.7 C), temperature source Oral, resp. rate 16, height 5\' 4"  (1.626 m), weight 94 kg, SpO2 99%.  Intake/Output Summary (Last 24 hours) at 08/09/2023 1230 Last data filed at 08/09/2023 0816 Gross per 24 hour  Intake 1837.53 ml  Output --  Net 1837.53 ml   Filed Weights   08/08/23 0147  Weight: 94 kg    Exam: Awake Alert, Oriented *3, No new F.N deficits, Normal affect [place].AT,PERRAL Supple Neck,No JVD, No cervical lymphadenopathy appriciated.  Symmetrical Chest wall movement, Good air movement bilaterally, CTAB RRR,No Gallops,Rubs or new Murmurs, No Parasternal Heave +ve B.Sounds, Abd Soft, Non tender, No organomegaly appriciated, No rebound -guarding or rigidity. No Cyanosis, Clubbing or edema, No new Rash or bruise   PERTINENT RADIOLOGIC STUDIES: Overnight EEG with video Result Date: 08/09/2023 Charlsie Quest, MD     08/09/2023 10:04 AM Patient Name: Shelby Jordan MRN: [id number] Epilepsy Attending: Charlsie Quest Referring  Physician/Provider: Erick Blinks, MD Duration: 08/08/2023 2130 to 08/09/2023 8657 Patient history: 73 y.o. female with hx of depression, Bipolar 1, Hep C, HTN, who presents with multiple episodes concerning for seizures after running out of Depakote a month ago which she uses for mood. EEG to evaluate for seizure Level of alertness: Awake, asleep AEDs during EEG study: VPA Technical aspects: This EEG study was done with scalp electrodes positioned according to the 10-20 International system of electrode placement. Electrical activity was reviewed with band pass filter of 1-70Hz , sensitivity of 7 uV/mm, display speed of 41mm/sec with a 60Hz  notched filter applied as appropriate. EEG data were recorded continuously and digitally stored.  Video monitoring was available and reviewed as appropriate. Description: The posterior dominant rhythm consists of 8-9 Hz activity of moderate voltage (25-35 uV) seen predominantly in posterior head regions, symmetric and reactive to eye opening and eye closing. Sleep was characterized by vertex waves, sleep spindles (12 to 14 Hz), maximal frontocentral region.  Hyperventilation and photic stimulation were not performed.   IMPRESSION: This study is within normal limits. No seizures or epileptiform discharges were seen throughout the recording. A normal interictal EEG does not exclude the diagnosis of epilepsy. Charlsie Quest   DG CHEST PORT 1 VIEW Result Date: 08/08/2023 CLINICAL DATA:  72 year old female with seizure-like activity. EXAM: PORTABLE CHEST 1 VIEW COMPARISON:  Chest x-ray 04/15/2023. FINDINGS: Lung volumes are normal. No consolidative airspace disease. Diffuse interstitial prominence and widespread peribronchial cuffing. No pleural effusions. No pneumothorax. No evidence of pulmonary edema. Heart size is normal. The patient is rotated to the left on today's exam, resulting in distortion of the mediastinal contours and reduced diagnostic sensitivity and  specificity for mediastinal pathology. IMPRESSION: 1. The appearance of the chest suggests an acute bronchitis. Electronically Signed   By: Trudie Reed M.D.   On: 08/08/2023 08:25   CT Head Wo Contrast Result Date: 08/08/2023 CLINICAL DATA:  New onset seizure. EXAM: CT HEAD WITHOUT CONTRAST TECHNIQUE: Contiguous axial images were obtained from the base of the skull through the vertex without intravenous contrast. RADIATION DOSE REDUCTION: This exam was performed according to the departmental dose-optimization program which includes automated exposure control, adjustment of the mA and/or kV according to patient size and/or use of iterative reconstruction technique. COMPARISON:  CT head 08/07/2023 FINDINGS: Brain: No intracranial hemorrhage, mass effect, or evidence of acute infarct. No  hydrocephalus. No extra-axial fluid collection. Age-commensurate cerebral atrophy and chronic small vessel ischemic disease. 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 Chronic left cerebellar infarct. Vascular: No hyperdense vessel. Intracranial arterial calcification. Skull: No fracture or focal lesion.  Left forehead hematoma. Sinuses/Orbits: No acute finding. Other: None. IMPRESSION: 1. No acute intracranial abnormality. 2. Left forehead hematoma. No skull fracture. Electronically Signed   By: Minerva Fester M.D.   On: 08/08/2023 03:56   CT HEAD WO CONTRAST ( ) Result Date: 08/07/2023 CLINICAL DATA:  Mechanical fall.  Laceration to middle of forehead. EXAM: CT HEAD WITHOUT CONTRAST CT CERVICAL SPINE WITHOUT CONTRAST TECHNIQUE: Multidetector CT imaging of the head and cervical spine was performed following the standard protocol without intravenous contrast. Multiplanar CT image reconstructions of the cervical spine were also generated. RADIATION DOSE REDUCTION: This exam was performed according to the departmental dose-optimization program which includes automated exposure control,  adjustment of the mA and/or kV according to patient size and/or use of iterative reconstruction technique. COMPARISON:  None Available. FINDINGS: CT HEAD FINDINGS Brain: No intracranial hemorrhage, mass effect, or evidence of acute infarct. No hydrocephalus. No extra-axial fluid collection. Mild cerebral atrophy and chronic small vessel ischemic disease. 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 Vascular: No hyperdense vessel. Intracranial arterial calcification. Skull: No fracture or focal lesion.  Left forehead scalp hematoma. Sinuses/Orbits: No acute finding. Other: None. CT CERVICAL SPINE FINDINGS Alignment: No evidence of traumatic malalignment. Skull base and vertebrae: No acute fracture. No primary bone lesion or focal pathologic process. Soft tissues and spinal canal: No prevertebral fluid or swelling. No visible canal hematoma. Disc levels: Multilevel spondylosis, disc space height loss, and degenerative endplate changes greatest at C5-C6 where it is moderate. No severe spinal canal narrowing. Upper chest: No acute abnormality. Other: None. IMPRESSION: 1. No acute intracranial abnormality. 2. No acute fracture in the cervical spine. Electronically Signed   By: Minerva Fester M.D.   On: 08/07/2023 22:22   CT Cervical Spine Wo Contrast Result Date: 08/07/2023 CLINICAL DATA:  Mechanical fall.  Laceration to middle of forehead. EXAM: CT HEAD WITHOUT CONTRAST CT CERVICAL SPINE WITHOUT CONTRAST TECHNIQUE: Multidetector CT imaging of the head and cervical spine was performed following the standard protocol without intravenous contrast. Multiplanar CT image reconstructions of the cervical spine were also generated. RADIATION DOSE REDUCTION: This exam was performed according to the departmental dose-optimization program which includes automated exposure control, adjustment of the mA and/or kV according to patient size and/or use of iterative reconstruction technique. COMPARISON:   None Available. FINDINGS: CT HEAD FINDINGS Brain: No intracranial hemorrhage, mass effect, or evidence of acute infarct. No hydrocephalus. No extra-axial fluid collection. Mild cerebral atrophy and chronic small vessel ischemic disease. 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 Vascular: No hyperdense vessel. Intracranial arterial calcification. Skull: No fracture or focal lesion.  Left forehead scalp hematoma. Sinuses/Orbits: No acute finding. Other: None. CT CERVICAL SPINE FINDINGS Alignment: No evidence of traumatic malalignment. Skull base and vertebrae: No acute fracture. No primary bone lesion or focal pathologic process. Soft tissues and spinal canal: No prevertebral fluid or swelling. No visible canal hematoma. Disc levels: Multilevel spondylosis, disc space height loss, and degenerative endplate changes greatest at C5-C6 where it is moderate. No severe spinal canal narrowing. Upper chest: No acute abnormality. Other: None. IMPRESSION: 1. No acute intracranial abnormality. 2. No acute fracture in the cervical spine. Electronically Signed   By: Angelique Holm.D.  On: 08/07/2023 22:22     PERTINENT LAB RESULTS: CBC: Recent Labs    08/08/23 0311 08/09/23 0342  WBC 12.1* 6.0  HGB 13.6 12.0  HCT 42.4 36.0  PLT 300 263   CMET CMP     Component Value Date/Time   NA 139 08/09/2023 0342   K 4.0 08/09/2023 0342   CL 103 08/09/2023 0342   CO2 24 08/09/2023 0342   GLUCOSE 101 (H) 08/09/2023 0342   BUN 12 08/09/2023 0342   CREATININE 1.26 (H) 08/09/2023 0342   CALCIUM 8.9 08/09/2023 0342   PROT 7.3 08/08/2023 0311   ALBUMIN 4.3 08/08/2023 0311   AST 24 08/08/2023 0311   ALT 19 08/08/2023 0311   ALKPHOS 45 08/08/2023 0311   BILITOT 0.7 08/08/2023 0311   GFR 47.84 (L) 04/15/2023 1252   GFRNONAA 45 (L) 08/09/2023 0342    GFR Estimated Creatinine Clearance: 44.9 mL/min (A) (by C-G formula based on SCr of 1.26 mg/dL (H)). No results for input(s):  "LIPASE", "AMYLASE" in the last 72 hours. Recent Labs    08/08/23 0348  CKTOTAL 64   Invalid input(s): "POCBNP" No results for input(s): "DDIMER" in the last 72 hours. No results for input(s): "HGBA1C" in the last 72 hours. No results for input(s): "CHOL", "HDL", "LDLCALC", "TRIG", "CHOLHDL", "LDLDIRECT" in the last 72 hours. No results for input(s): "TSH", "T4TOTAL", "T3FREE", "THYROIDAB" in the last 72 hours.  Invalid input(s): "FREET3" No results for input(s): "VITAMINB12", "FOLATE", "FERRITIN", "TIBC", "IRON", "RETICCTPCT" in the last 72 hours. Coags: No results for input(s): "INR" in the last 72 hours.  Invalid input(s): "PT" Microbiology: Recent Results (from the past 240 hours)  Respiratory (~20 pathogens) panel by PCR     Status: None   Collection Time: 08/08/23  8:51 AM   Specimen: Nasopharyngeal Swab; Respiratory  Result Value Ref Range Status   Adenovirus NOT DETECTED NOT DETECTED Final   Coronavirus 229E NOT DETECTED NOT DETECTED Final    Comment: (NOTE) The Coronavirus on the Respiratory Panel, DOES NOT test for the novel  Coronavirus (2019 nCoV)    Coronavirus HKU1 NOT DETECTED NOT DETECTED Final   Coronavirus NL63 NOT DETECTED NOT DETECTED Final   Coronavirus OC43 NOT DETECTED NOT DETECTED Final   Metapneumovirus NOT DETECTED NOT DETECTED Final   Rhinovirus / Enterovirus NOT DETECTED NOT DETECTED Final   Influenza A NOT DETECTED NOT DETECTED Final   Influenza B NOT DETECTED NOT DETECTED Final   Parainfluenza Virus 1 NOT DETECTED NOT DETECTED Final   Parainfluenza Virus 2 NOT DETECTED NOT DETECTED Final   Parainfluenza Virus 3 NOT DETECTED NOT DETECTED Final   Parainfluenza Virus 4 NOT DETECTED NOT DETECTED Final   Respiratory Syncytial Virus NOT DETECTED NOT DETECTED Final   Bordetella pertussis NOT DETECTED NOT DETECTED Final   Bordetella Parapertussis NOT DETECTED NOT DETECTED Final   Chlamydophila pneumoniae NOT DETECTED NOT DETECTED Final   Mycoplasma  pneumoniae NOT DETECTED NOT DETECTED Final    Comment: Performed at [facility], [address] [place], [place] [address]  SARS Coronavirus 2 by RT PCR (hospital order, performed in [facility] hospital lab) *cepheid single result test* Anterior Nasal Swab     Status: None   Collection Time: 08/08/23  8:51 AM   Specimen: Anterior Nasal Swab  Result Value Ref Range Status   SARS Coronavirus 2 by RT PCR NEGATIVE NEGATIVE Final    Comment: Performed at [facility], [address] [place], [place] [address]  FURTHER DISCHARGE INSTRUCTIONS:  Get Medicines reviewed and adjusted: Please take all your medications with you for your next visit with your Primary MD  Laboratory/radiological data: Please request your Primary MD to go over all hospital tests and procedure/radiological results at the follow up, please ask your Primary MD to get all Hospital records sent to his/her office.  In some cases, they will be blood work, cultures and biopsy results pending at the time of your discharge. Please request that your primary care M.D. goes through all the records of your hospital data and follows up on these results.  Also Note the following: If you experience worsening of your admission symptoms, develop shortness of breath, life threatening emergency, suicidal or homicidal thoughts you must seek medical attention immediately by calling 911 or calling your MD immediately  if symptoms less severe.  You must read complete instructions/literature along with all the possible adverse reactions/side effects for all the Medicines you take and that have been prescribed to you. Take any new Medicines after you have completely understood and accpet all the possible adverse reactions/side effects.   Do not drive when taking Pain medications or sleeping medications (Benzodaizepines)  Do not take more than prescribed Pain, Sleep and Anxiety Medications. It is not advisable to combine  anxiety,sleep and pain medications without talking with your primary care practitioner  Special Instructions: If you have smoked or chewed Tobacco  in the last 2 yrs please stop smoking, stop any regular Alcohol  and or any Recreational drug use.  Wear Seat belts while driving.  Please note: You were cared for by a hospitalist during your hospital stay. Once you are discharged, your primary care physician will handle any further medical issues. Please note that NO REFILLS for any discharge medications will be authorized once you are discharged, as it is imperative that you return to your primary care physician (or establish a relationship with a primary care physician if you do not have one) for your post hospital discharge needs so that they can reassess your need for medications and monitor your lab values.  Total Time spent coordinating discharge including counseling, education and face to face time equals greater than 30 minutes.  SignedJeoffrey Massed 08/09/2023 12:30 PM

## 2023-08-09 NOTE — Care Management CC44 (Signed)
Condition Code 44 Documentation Completed  Patient Details  Name: Shelby Jordan MRN: [id number] Date of Birth: 19-Jul-1951   Condition Code 44 given:  Yes Patient signature on Condition Code 44 notice:  Yes Documentation of 2 MD's agreement:  Yes Code 44 added to claim:  Yes    Gordy Clement, RN 08/09/2023, 1:01 PM

## 2023-08-09 NOTE — Care Management Obs Status (Signed)
MEDICARE OBSERVATION STATUS NOTIFICATION   Patient Details  Name: Tabassum Scarff MRN: [id number] Date of Birth: 1951/05/29   Medicare Observation Status Notification Given:       Gordy Clement, RN 08/09/2023, 1:01 PM

## 2023-08-09 NOTE — Progress Notes (Signed)
NEUROLOGY CONSULT FOLLOW UP NOTE   Date of service: August 09, 2023 Patient Name: Shelby Jordan MRN:  [id number] DOB:  07/16/1951  Interval Hx/subjective   - No further clinically concerning events - No headache or other acute complaints    Vitals   Vitals:   08/08/23 2015 08/08/23 2122 08/09/23 0005 08/09/23 1119  BP: 133/66  109/68 (!) 116/52  Pulse: 80  76 (!) 57  Resp: (!) 24 20 18 16   Temp:   98.2 F (36.8 C) 98 F (36.7 C)  TempSrc:   Oral Oral  SpO2: 100%  99% 99%  Weight:      Height:        Current vital signs: BP (!) 116/52 (BP Location: Right Arm)   Pulse (!) 57   Temp 98 F (36.7 C) (Oral)   Resp 16   Ht 5\' 4"  (1.626 m)   Wt 94 kg   SpO2 99%   BMI 35.57 kg/m  Vital signs in last 24 hours: Temp:  [97.9 F (36.6 C)-98.4 F (36.9 C)] 98 F (36.7 C) (01/20 1119) Pulse Rate:  [57-80] 57 (01/20 1119) Resp:  [14-29] 16 (01/20 1119) BP: (109-157)/(52-83) 116/52 (01/20 1119) SpO2:  [99 %-100 %] 99 % (01/20 1119)   Body mass index is 35.57 kg/m.  Physical Exam   Constitutional: Appears well-developed and well-nourished.  Psych: Affect appropriate to situation, pleasant cooperative Eyes: No scleral injection.  HENT: No OP obstrucion.  Head: Normocephalic. Respiratory: Effort normal, non-labored breathing.   Neurologic Examination   Eating comfortably in bed, fully oriented, able to name, repeat. Knows she "fell out" in closet trying to get dressed to go somewhere but doesn't know why.  Face symmetric, EOMI, VFF, tongue midline,  Feeding self without any dysmetria with the right hand. Finger to nose intact on the left   Medications  Current Facility-Administered Medications:    0.9 %  sodium chloride infusion, , Intravenous, Continuous, Katrinka Blazing, Rondell A, MD, Last Rate: 75 mL/hr at 08/09/23 1126, New Bag at 08/09/23 1126   acetaminophen (TYLENOL) tablet 650 mg, 650 mg, Oral, Q6H PRN **OR** acetaminophen (TYLENOL) suppository 650 mg, 650 mg,  Rectal, Q6H PRN, Katrinka Blazing, Rondell A, MD   albuterol (PROVENTIL) (2.5 MG/3ML) 0.083% nebulizer solution 2.5 mg, 2.5 mg, Nebulization, Q6H PRN, Katrinka Blazing, Rondell A, MD   amLODipine (NORVASC) tablet 5 mg, 5 mg, Oral, Daily, Smith, Rondell A, MD, 5 mg at 08/09/23 1120   divalproex (DEPAKOTE) DR tablet 500 mg, 500 mg, Oral, BID, Smith, Rondell A, MD, 500 mg at 08/09/23 1120   enoxaparin (LOVENOX) injection 40 mg, 40 mg, Subcutaneous, Q24H, Smith, Rondell A, MD, 40 mg at 08/08/23 1933   hydrALAZINE (APRESOLINE) injection 10 mg, 10 mg, Intravenous, Q4H PRN, Katrinka Blazing, Rondell A, MD   LORazepam (ATIVAN) injection 1 mg, 1 mg, Intravenous, PRN, Mesner, Barbara Cower, MD   magnesium gluconate (MAGONATE) tablet 500 mg, 500 mg, Oral, QHS, Smith, Rondell A, MD, 500 mg at 08/08/23 2243   metoprolol succinate (TOPROL-XL) 24 hr tablet 50 mg, 50 mg, Oral, Daily, Smith, Rondell A, MD, 50 mg at 08/09/23 1120   mirabegron ER (MYRBETRIQ) tablet 25 mg, 25 mg, Oral, Daily, Smith, Rondell A, MD, 25 mg at 08/09/23 1120   mometasone-formoterol (DULERA) 100-5 MCG/ACT inhaler 2 puff, 2 puff, Inhalation, BID, Smith, Rondell A, MD   OLANZapine (ZYPREXA) tablet 10 mg, 10 mg, Oral, QHS, Smith, Rondell A, MD, 10 mg at 08/08/23 2242   ondansetron (ZOFRAN) tablet 4 mg, 4 mg,  Oral, Q6H PRN **OR** ondansetron (ZOFRAN) injection 4 mg, 4 mg, Intravenous, Q6H PRN, Katrinka Blazing, Rondell A, MD   potassium chloride (KLOR-CON M) CR tablet 10 mEq, 10 mEq, Oral, Daily, Smith, Rondell A, MD, 10 mEq at 08/09/23 1120   sodium chloride flush (NS) 0.9 % injection 3 mL, 3 mL, Intravenous, Q12H, Smith, Rondell A, MD, 3 mL at 08/09/23 1121   traZODone (DESYREL) tablet 50 mg, 50 mg, Oral, QHS PRN, Madelyn Flavors A, MD, 50 mg at 08/08/23 2243  Labs and Diagnostic Imaging   CBC:  Recent Labs  Lab 08/07/23 2155 08/08/23 0311 08/09/23 0342  WBC 6.7 12.1* 6.0  NEUTROABS 5.0 8.1*  --   HGB 13.6 13.6 12.0  HCT 40.9 42.4 36.0  MCV 86.8 91.6 88.2  PLT 284 300 263     Basic Metabolic Panel:  Lab Results  Component Value Date   NA 139 08/09/2023   K 4.0 08/09/2023   CO2 24 08/09/2023   GLUCOSE 101 (H) 08/09/2023   BUN 12 08/09/2023   CREATININE 1.26 (H) 08/09/2023   CALCIUM 8.9 08/09/2023   GFRNONAA 45 (L) 08/09/2023   GFRAA 40 (L) 01/01/2020   Lipid Panel:  Lab Results  Component Value Date   LDLCALC 135 (H) 11/28/2021   HgbA1c:  Lab Results  Component Value Date   HGBA1C 6.1 04/15/2023   Urine Drug Screen:     Component Value Date/Time   LABOPIA NONE DETECTED 07/20/2022 0250   LABOPIA NONE DETECTED 07/29/2021 0505   COCAINSCRNUR NONE DETECTED 07/20/2022 0250   LABBENZ NONE DETECTED 07/20/2022 0250   LABBENZ NONE DETECTED 07/29/2021 0505   AMPHETMU NONE DETECTED 07/20/2022 0250   AMPHETMU NONE DETECTED 07/29/2021 0505   THCU NONE DETECTED 07/20/2022 0250   THCU NONE DETECTED 07/29/2021 0505   LABBARB NONE DETECTED 07/20/2022 0250   LABBARB NONE DETECTED 07/29/2021 0505    Alcohol Level     Component Value Date/Time   ETH <10 07/19/2022 1045   CT Head without contrast(Personally reviewed): 1. No acute intracranial abnormality. 2. Left forehead hematoma. No skull fracture  EEG:  This study is within normal limits. No seizures or epileptiform discharges were seen throughout the recording. A normal interictal EEG does not exclude the diagnosis of epilepsy.  Assessment   Shelby Jordan is a 73 y.o. female with c/f focal seizure unmasked in the setting of missing Depakote due to running out of it (uses for mood primarily)   Recommendations  - DC LTM EEG - Continue depakote 500 mg BID  - Outpatient referral to Dr. Teresa Coombs placed at [facility]  - Seizure precautions reviewed with patient including no driving, include in discharge instructions  Standard seizure precautions: Per [facility] statutes, patients with seizures are not allowed to drive until  they have been seizure-free for six months.  Use caution when using heavy equipment or power tools. Avoid working on ladders or at heights. Take showers instead of baths. Ensure the water temperature is not too high on the home water heater. Do not go swimming alone. When caring for infants or small children, sit down when holding, feeding, or changing them to minimize risk of injury to the child in the event you have a seizure.  To reduce risk of seizures, maintain good sleep hygiene avoid alcohol and illicit drug use, take all anti-seizure medications as prescribed.   ______________________________________________________________________   Brooke Dare MD-PhD [facility] [phone]  >35 min spent in care, majority at bedside

## 2023-08-09 NOTE — Progress Notes (Signed)
vLTM discontinued.  [facility] notified.  No skin breakdown noted at all skin sites. ?

## 2023-08-09 NOTE — Progress Notes (Signed)
   08/09/23 1232  TOC Brief Assessment  Insurance and Status Reviewed [facility] Medicare Dual Complete)  Patient has primary care physician Yes Alfonse Alpers, Tabitha, FNP)  Home environment has been reviewed From home with Family  Also has caregiver a few hours per day  Prior level of function: dependent obn family for assistance with ADL's  Prior/Current Home Services No current home services (No HX shown in [employer])  Social Drivers of Health Review SDOH reviewed no interventions necessary  Readmission risk has been reviewed Yes (15%)  Transition of care needs no transition of care needs at this time   [facility] will continue to follow patient for any additional discharge needs . Patient has family support in the home. RNCM spoke with Daughter who confirmed above information. Daughter stated that provider has called her and discussed a possible DC to home today for patient after therapy worked with patient . Will follow for any DME needs or Home therapy needs

## 2023-08-09 NOTE — Procedures (Signed)
Patient Name: Shelby Jordan  MRN: [id number]  Epilepsy Attending: Charlsie Quest  Referring Physician/Provider: Erick Blinks, MD  Duration: 08/08/2023 4098 to 08/09/2023 1191  Patient history: 73 y.o. female with hx of depression, Bipolar 1, Hep C, HTN, who presents with multiple episodes concerning for seizures after running out of Depakote a month ago which she uses for mood. EEG to evaluate for seizure  Level of alertness: Awake, asleep  AEDs during EEG study: VPA  Technical aspects: This EEG study was done with scalp electrodes positioned according to the 10-20 International system of electrode placement. Electrical activity was reviewed with band pass filter of 1-70Hz , sensitivity of 7 uV/mm, display speed of 62mm/sec with a 60Hz  notched filter applied as appropriate. EEG data were recorded continuously and digitally stored.  Video monitoring was available and reviewed as appropriate.  Description: The posterior dominant rhythm consists of 8-9 Hz activity of moderate voltage (25-35 uV) seen predominantly in posterior head regions, symmetric and reactive to eye opening and eye closing. Sleep was characterized by vertex waves, sleep spindles (12 to 14 Hz), maximal frontocentral region.  Hyperventilation and photic stimulation were not performed.     IMPRESSION: This study is within normal limits. No seizures or epileptiform discharges were seen throughout the recording.  A normal interictal EEG does not exclude the diagnosis of epilepsy.   Jashun Puertas [name]

## 2023-08-10 ENCOUNTER — Other Ambulatory Visit (HOSPITAL_COMMUNITY): Payer: Self-pay

## 2023-08-10 ENCOUNTER — Inpatient Hospital Stay (HOSPITAL_COMMUNITY): Payer: 59

## 2023-08-10 DIAGNOSIS — R2681 Unsteadiness on feet: Secondary | ICD-10-CM | POA: Diagnosis present

## 2023-08-10 DIAGNOSIS — Z91018 Allergy to other foods: Secondary | ICD-10-CM | POA: Diagnosis not present

## 2023-08-10 DIAGNOSIS — J45998 Other asthma: Secondary | ICD-10-CM | POA: Diagnosis not present

## 2023-08-10 DIAGNOSIS — Z818 Family history of other mental and behavioral disorders: Secondary | ICD-10-CM | POA: Diagnosis not present

## 2023-08-10 DIAGNOSIS — N1831 Chronic kidney disease, stage 3a: Secondary | ICD-10-CM | POA: Diagnosis present

## 2023-08-10 DIAGNOSIS — Z91148 Patient's other noncompliance with medication regimen for other reason: Secondary | ICD-10-CM | POA: Diagnosis not present

## 2023-08-10 DIAGNOSIS — I10 Essential (primary) hypertension: Secondary | ICD-10-CM | POA: Diagnosis not present

## 2023-08-10 DIAGNOSIS — Z7401 Bed confinement status: Secondary | ICD-10-CM | POA: Diagnosis not present

## 2023-08-10 DIAGNOSIS — G40909 Epilepsy, unspecified, not intractable, without status epilepticus: Secondary | ICD-10-CM | POA: Diagnosis present

## 2023-08-10 DIAGNOSIS — I6782 Cerebral ischemia: Secondary | ICD-10-CM | POA: Diagnosis not present

## 2023-08-10 DIAGNOSIS — J449 Chronic obstructive pulmonary disease, unspecified: Secondary | ICD-10-CM | POA: Diagnosis present

## 2023-08-10 DIAGNOSIS — R569 Unspecified convulsions: Secondary | ICD-10-CM | POA: Diagnosis present

## 2023-08-10 DIAGNOSIS — R0602 Shortness of breath: Secondary | ICD-10-CM | POA: Diagnosis not present

## 2023-08-10 DIAGNOSIS — Z1152 Encounter for screening for COVID-19: Secondary | ICD-10-CM | POA: Diagnosis not present

## 2023-08-10 DIAGNOSIS — I129 Hypertensive chronic kidney disease with stage 1 through stage 4 chronic kidney disease, or unspecified chronic kidney disease: Secondary | ICD-10-CM | POA: Diagnosis present

## 2023-08-10 DIAGNOSIS — W109XXA Fall (on) (from) unspecified stairs and steps, initial encounter: Secondary | ICD-10-CM | POA: Diagnosis present

## 2023-08-10 DIAGNOSIS — Z79899 Other long term (current) drug therapy: Secondary | ICD-10-CM | POA: Diagnosis not present

## 2023-08-10 DIAGNOSIS — R509 Fever, unspecified: Secondary | ICD-10-CM | POA: Diagnosis not present

## 2023-08-10 DIAGNOSIS — F319 Bipolar disorder, unspecified: Secondary | ICD-10-CM | POA: Diagnosis present

## 2023-08-10 DIAGNOSIS — D72829 Elevated white blood cell count, unspecified: Secondary | ICD-10-CM | POA: Diagnosis present

## 2023-08-10 DIAGNOSIS — E782 Mixed hyperlipidemia: Secondary | ICD-10-CM | POA: Diagnosis not present

## 2023-08-10 DIAGNOSIS — E8721 Acute metabolic acidosis: Secondary | ICD-10-CM | POA: Diagnosis not present

## 2023-08-10 DIAGNOSIS — Z6835 Body mass index (BMI) 35.0-35.9, adult: Secondary | ICD-10-CM | POA: Diagnosis not present

## 2023-08-10 DIAGNOSIS — E876 Hypokalemia: Secondary | ICD-10-CM | POA: Diagnosis present

## 2023-08-10 DIAGNOSIS — Y92009 Unspecified place in unspecified non-institutional (private) residence as the place of occurrence of the external cause: Secondary | ICD-10-CM | POA: Diagnosis not present

## 2023-08-10 DIAGNOSIS — R7303 Prediabetes: Secondary | ICD-10-CM | POA: Diagnosis present

## 2023-08-10 DIAGNOSIS — R296 Repeated falls: Secondary | ICD-10-CM | POA: Diagnosis present

## 2023-08-10 DIAGNOSIS — Z743 Need for continuous supervision: Secondary | ICD-10-CM | POA: Diagnosis not present

## 2023-08-10 DIAGNOSIS — E66811 Obesity, class 1: Secondary | ICD-10-CM | POA: Diagnosis present

## 2023-08-10 DIAGNOSIS — T426X6A Underdosing of other antiepileptic and sedative-hypnotic drugs, initial encounter: Secondary | ICD-10-CM | POA: Diagnosis present

## 2023-08-10 DIAGNOSIS — R6889 Other general symptoms and signs: Secondary | ICD-10-CM | POA: Diagnosis not present

## 2023-08-10 DIAGNOSIS — E872 Acidosis, unspecified: Secondary | ICD-10-CM | POA: Diagnosis present

## 2023-08-10 DIAGNOSIS — Z8673 Personal history of transient ischemic attack (TIA), and cerebral infarction without residual deficits: Secondary | ICD-10-CM | POA: Diagnosis not present

## 2023-08-10 DIAGNOSIS — E7800 Pure hypercholesterolemia, unspecified: Secondary | ICD-10-CM | POA: Diagnosis present

## 2023-08-10 DIAGNOSIS — Z87891 Personal history of nicotine dependence: Secondary | ICD-10-CM | POA: Diagnosis not present

## 2023-08-10 NOTE — Procedures (Signed)
Patient Name: Shelby Jordan  MRN: [id number]  Epilepsy Attending: Charlsie Quest  Referring Physician/Provider: Erick Blinks, MD  Duration: 08/09/2023 6213 to 08/09/2023 1234   Patient history: 73 y.o. female with hx of depression, Bipolar 1, Hep C, HTN, who presents with multiple episodes concerning for seizures after running out of Depakote a month ago which she uses for mood. EEG to evaluate for seizure   Level of alertness: Awake   AEDs during EEG study: VPA   Technical aspects: This EEG study was done with scalp electrodes positioned according to the 10-20 International system of electrode placement. Electrical activity was reviewed with band pass filter of 1-70Hz , sensitivity of 7 uV/mm, display speed of 19mm/sec with a 60Hz  notched filter applied as appropriate. EEG data were recorded continuously and digitally stored.  Video monitoring was available and reviewed as appropriate.   Description: The posterior dominant rhythm consists of 8-9 Hz activity of moderate voltage (25-35 uV) seen predominantly in posterior head regions, symmetric and reactive to eye opening and eye closing. Hyperventilation and photic stimulation were not performed.      IMPRESSION: This study is within normal limits. No seizures or epileptiform discharges were seen throughout the recording.   A normal interictal EEG does not exclude the diagnosis of epilepsy.     Ashawn Rinehart [name]

## 2023-08-10 NOTE — TOC Initial Note (Addendum)
Transition of Care [facility]) - Initial/Assessment Note    Patient Details  Name: Shelby Jordan MRN: [id number] Date of Birth: 01/23/1951  Transition of Care [facility]) CM/SW Contact:    Mearl Latin, LCSW Phone Number: 08/10/2023, 2:00 PM  Clinical Narrative:                 CSW received consult for possible SNF placement at time of discharge. CSW spoke with patient. Patient expressed understanding of PT recommendation and is agreeable to SNF placement at time of discharge. Patient requested CSW call her daughter as she is not familiar with the SNF facilities. CSW discussed insurance authorization process and will provide Medicare SNF ratings list. CSW will send out referrals for review and provide bed offers as available.   CSW spoke with patient's daughter and emailed her SNF list to [email or web address] as requested.   CSW started insurance process pending a SNF choice, Ref# [id number].   Skilled Nursing Rehab Facilities-   [email or web address] Ratings out of 5 stars (the highest)   Name Address  Phone # Quality Care Staffing Health Inspection Overall  [facility] [address] [phone] 2 2 5 5   [facility] [address] [place] [phone] 4 2 4 4   [facility] [address], [place]in[place] Otto [phone] 2 1 2 1   [facility] [address], [place] [phone] 3 1 4 3   [facility]  [address], [place] [phone] 4 4 5 5   [facility] [address], [facility] [phone] 3 2 2 2   [facility] [address], [place] [phone] 5 1 2 2   [facility] [phone] [address], [place] [phone] 1 1 3 1   [address] ([facility]) [address] [place] [phone] 2 2 2 2   [facility] [address] [place], [place] [phone] 2 2 1 1   [facility] ([place]) [address], [place] [phone] 3 1 1 1   Eligha [facility] [address] [place] [phone] 3 3 4 4   [facility] [address] [place] [phone] 3 4 3 3           [facility] [address], [place] [phone] 4 2 1 1   [facility] [address], [place] [phone] 2 2 2 2   [facility] [place] [address], [place] Ditto [phone] 2 1 4 3   [address] [place] [address], [place] [phone] 1 1 2 1   [facility] [address] [employer] [phone] 2 1 4 3           [address] (no [facility]) [address], [place] [phone] 4 4 5 5   [facility] (No [employer]) [address] [place], [place] [phone] 1 2 4 3   [facility] (No [employer]) [address], [place] [phone] 4 1 5 4   [facility] [address] [employer] [phone] 3 4 2 2   [facility] [address] [place] [phone] 2 1 2 1   [facility] [address], [place] [phone] 2 1 1 1   [facility] [address] [place] [phone] 4 2 5 5   [facility]  [address], [place] [phone] 2 2 3 3   [facility] [address] [place] [phone] 4 1 1 1   [facility] [address] [place], [place] [phone] 2 2 2 2           [facility] [address] [place] [phone] 2 1 1 1   [facility] [address], [place]lyn Clines  [phone] 3 3 3 3   [facility] [address], [place]an [phone] [facility] [place] [address], [place] [phone] 2 1 1 1   [facility] (No Humana) [address] [place] [phone] 2 2 4 4   [facility] [address], [place] Levan [phone] 2 1 2 1           [facility] [address] [place], [place] [phone] 5 4 5 5   [facility])  [address] [place] [phone] 1 1 2 1   [facility]) [address] [place] [phone]  2 4 4   [facility] [address] [place] [phone] 3 5 5 5    [address] [place], [place] [phone] 4 2 2 2   [facility]) [address] [place] [phone] 2 1 3 2      Expected Discharge Plan: Skilled Nursing Facility Barriers to Discharge: Continued Medical Work up, English as a second language teacher, SNF Pending bed offer, Awaiting State Approval Cherlyn Roberts)   Patient Goals and CMS Choice Patient states their goals for this hospitalization and ongoing recovery are:: Rehab CMS Medicare.gov Compare Post Acute Care list provided to:: Patient Choice offered to / list presented to : Patient [place] ownership interest in [facility].provided to:: Patient    Expected Discharge Plan and Services In-house Referral: Clinical Social Work   Post Acute Care Choice: Skilled Nursing Facility Living arrangements for the past 2 months: Single Family Home Expected Discharge Date: 08/09/23                                    Prior Living Arrangements/Services Living arrangements for the past 2 months: Single Family Home Lives with:: Self Patient language and need for interpreter reviewed:: Yes Do you feel safe going back to the place where you live?: Yes      Need for Family Participation in Patient Care: Yes (Comment) Care giver support system in place?: Yes (comment) Current home services: DME, Homehealth aide Criminal Activity/Legal Involvement Pertinent to Current Situation/Hospitalization: No - Comment as needed  Activities of Daily Living      Permission Sought/Granted Permission sought to share information with : Facility Medical sales representative, Family Supports Permission granted to share information with : Yes, Verbal Permission Granted  Share Information with NAME: Flint Melter  Permission granted to share info w AGENCY: SNFs  Permission granted to share info w Relationship: Daughter  Permission granted to share info w Contact Information: [phone]  Emotional  Assessment Appearance:: Appears stated age Attitude/Demeanor/Rapport: Engaged Affect (typically observed): Accepting Orientation: : Oriented to Self, Oriented to Place, Oriented to Situation, Oriented to  Time Alcohol / Substance Use: Not Applicable Psych Involvement: No (comment)  Admission diagnosis:  Seizure (HCC) [R56.9] Seizure disorder (HCC) [G40.909] Patient Active Problem List   Diagnosis Date Noted   Seizure disorder (HCC) 08/10/2023   Seizure (HCC) 08/08/2023   Leukocytosis 08/08/2023   Metabolic acidosis with increased anion gap and accumulation of organic acids 08/08/2023   Hypokalemia 08/08/2023   Poor personal hygiene 04/19/2023   Tobacco abuse 04/15/2023   Urge incontinence of urine 02/09/2023   Moderate persistent asthma 12/15/2022   Vitamin D deficiency 12/15/2022   Primary hypertension 12/15/2022   Mixed stress and urge urinary incontinence 12/10/2022   Bipolar 1 disorder (HCC) 07/20/2022   Self-care deficit for dressing and  grooming 06/28/2022   At risk for self care deficit 06/28/2022   Uterine leiomyoma 05/07/2022   Atherosclerosis of aorta (HCC) 05/04/2022   Bilateral inguinal hernia without obstruction or gangrene 05/04/2022   Abnormal MMSE 03/05/2022   Memory loss 03/05/2022   Osteopenia of left hip 02/02/2022   Schizoaffective disorder, bipolar type (HCC) 11/28/2021   Hyperglycemia 11/28/2021   History of hepatitis C 11/28/2021   Chronic kidney disease, stage 3a (HCC) 11/28/2021   Obesity (BMI 35.0-39.9 without comorbidity) 11/28/2021   Chronic obstructive pulmonary disease (HCC) 11/28/2021   Mixed hyperlipidemia 11/28/2021   Post-menopausal 11/28/2021   Left lower quadrant abdominal mass 11/28/2021   History of tobacco abuse 11/28/2021   Bipolar I disorder, most recent episode (or current) manic (HCC) 12/15/2019   Essential hypertension 12/15/2019   PCP:  Mort Sawyers, FNP Pharmacy:   [phone] [address], [place] - [address] [place] [address] Phone: [phone] Fax: [phone]  [facility] [address] [place] [address] Phone: [phone] Fax: [phone]     Social Drivers of Health (SDOH) Social History: SDOH Screenings   Food Insecurity: Patient Unable To Answer (08/09/2023)  Housing: Patient Unable To Answer (08/09/2023)  Transportation Needs: Patient Unable To Answer (08/09/2023)  Utilities: Patient Unable To Answer (08/09/2023)  Alcohol Screen: Low Risk  (07/20/2022)  Depression (PHQ2-9): Low Risk  (04/15/2023)  Financial Resource Strain: Low Risk  (05/13/2022)  Physical Activity: Inactive (05/13/2022)  Social Connections: Patient Unable To Answer (08/09/2023)  Stress: No Stress Concern Present (05/13/2022)  Tobacco Use: Medium Risk (08/08/2023)   SDOH Interventions:     Readmission Risk Interventions     No data to display

## 2023-08-10 NOTE — Evaluation (Signed)
Occupational Therapy Evaluation Patient Details Name: Shelby Jordan MRN: [id number] DOB: 12-Sep-1950 Today's Date: 08/10/2023   History of Present Illness 73 y.o. female presents to [facility] hospital on 08/07/2023 with concern for breakthrough seizures. PMH includes HTN, HLD, bipolar disorder, CKD III.   Clinical Impression   Attempted eval. Pt taken to MRI. Will return at a later time.   Luisa Dago, OT/L   Acute OT Clinical Specialist Acute Rehabilitation Services Pager [phone] Office [phone]

## 2023-08-10 NOTE — Plan of Care (Signed)
  Problem: Education: Goal: Knowledge of General Education information will improve Description: Including pain rating scale, medication(s)/side effects and non-pharmacologic comfort measures Outcome: Progressing   Problem: Health Behavior/Discharge Planning: Goal: Ability to manage health-related needs will improve Outcome: Progressing   Problem: Clinical Measurements: Goal: Ability to maintain clinical measurements within normal limits will improve Outcome: Progressing Goal: Will remain free from infection Outcome: Progressing Goal: Diagnostic test results will improve Outcome: Progressing Goal: Respiratory complications will improve Outcome: Progressing Goal: Cardiovascular complication will be avoided Outcome: Progressing   Problem: Pain Managment: Goal: General experience of comfort will improve and/or be controlled Outcome: Progressing   Problem: Elimination: Goal: Will not experience complications related to bowel motility Outcome: Progressing Goal: Will not experience complications related to urinary retention Outcome: Progressing   Problem: Safety: Goal: Ability to remain free from injury will improve Outcome: Progressing   Problem: Skin Integrity: Goal: Risk for impaired skin integrity will decrease Outcome: Progressing

## 2023-08-10 NOTE — Evaluation (Signed)
Physical Therapy Evaluation Patient Details Name: Shelby Jordan MRN: [id number] DOB: [birth year] Today's Date: 08/10/2023  History of Present Illness  73 y.o. female presents to [facility] hospital on 08/07/2023 with concern for breakthrough seizures. PMH includes HTN, HLD, bipolar disorder, CKD III.  Clinical Impression  Pt presents to PT with deficits in functional mobility, gait, balance, endurance, cognition. Pt demonstrates a persistent posterior lean in sitting and standing which she is unable to consistently correct without physical assistance. When ambulating the pt shuffles, which is atypical of baseline. Pt's granddaughter also reports the pt's cognition to still be altered compared to baseline, with poor memory and awareness. Pt remains at a high risk for falls due to instability and reduced awareness of deficits. PT will continue to follow in the acute setting in an effort to restore independence in mobility. Short term inpatient PT workup is recommended at this time.        If plan is discharge home, recommend the following: A lot of help with walking and/or transfers;A lot of help with bathing/dressing/bathroom;Assistance with cooking/housework;Direct supervision/assist for medications management;Direct supervision/assist for financial management;Assist for transportation;Help with stairs or ramp for entrance;Supervision due to cognitive status   Can travel by private vehicle   Yes    Equipment Recommendations Rolling walker (2 wheels);BSC/3in1  Recommendations for Other Services       Functional Status Assessment Patient has had a recent decline in their functional status and demonstrates the ability to make significant improvements in function in a reasonable and predictable amount of time.     Precautions / Restrictions Precautions Precautions: Fall;Other (comment) (seizures) Restrictions Weight Bearing Restrictions Per Provider Order: No      Mobility  Bed Mobility Overal  bed mobility: Needs Assistance Bed Mobility: Supine to Sit, Sit to Supine     Supine to sit: Min assist, HOB elevated Sit to supine: Min assist, HOB elevated   General bed mobility comments: pt with difficulty scooting to edge of bed due to posterior lean    Transfers Overall transfer level: Needs assistance Equipment used: Rolling walker (2 wheels) Transfers: Sit to/from Stand Sit to Stand: Min assist           General transfer comment: posterior lean with 4 sit to stands during session    Ambulation/Gait Ambulation/Gait assistance: Min assist Gait Distance (Feet): 30 Feet Assistive device: Rolling walker (2 wheels) Gait Pattern/deviations: Shuffle Gait velocity: reduced Gait velocity interpretation: <1.31 ft/sec, indicative of household ambulator   General Gait Details: short shuffling steps, unable to increase step length despite PT verbal cues to increase step length/height  Stairs            Wheelchair Mobility     Tilt Bed    Modified Rankin (Stroke Patients Only)       Balance Overall balance assessment: Needs assistance Sitting-balance support: No upper extremity supported, Feet supported Sitting balance-Leahy Scale: Poor Sitting balance - Comments: persistent posteiror lean Postural control: Posterior lean Standing balance support: Bilateral upper extremity supported, Reliant on assistive device for balance Standing balance-Leahy Scale: Poor Standing balance comment: posterior lean                             Pertinent Vitals/Pain Pain Assessment Pain Assessment: No/denies pain    Home Living Family/patient expects to be discharged to:: Private residence Living Arrangements: Children;Other relatives Available Help at Discharge: Family;Available 24 hours/day Type of Home: House Home Access: Stairs to enter Entrance Stairs-Rails:  None Entrance Stairs-Number of Steps: 1 Alternate Level Stairs-Number of Steps: flight Home  Layout: Two level Home Equipment: Shower seat      Prior Function Prior Level of Function : Needs assist             Mobility Comments: ambulatory without DME ADLs Comments: assist for transportation, medications, IADLs     Extremity/Trunk Assessment   Upper Extremity Assessment Upper Extremity Assessment: Generalized weakness    Lower Extremity Assessment Lower Extremity Assessment: Generalized weakness    Cervical / Trunk Assessment Cervical / Trunk Assessment: Normal  Communication   Communication Communication: No apparent difficulties Cueing Techniques: Verbal cues;Tactile cues  Cognition Arousal: Alert (brief periods of lethargy) Behavior During Therapy: Impulsive Overall Cognitive Status: Impaired/Different from baseline Area of Impairment: Attention, Memory, Following commands, Safety/judgement, Awareness, Problem solving                   Current Attention Level: Focused Memory: Decreased recall of precautions, Decreased short-term memory Following Commands: Follows one step commands with increased time, Follows multi-step commands inconsistently Safety/Judgement: Decreased awareness of safety Awareness: Intellectual Problem Solving: Slow processing, Decreased initiation, Requires verbal cues          General Comments General comments (skin integrity, edema, etc.): VSS on RA, mild hypertension 161/70 (had not received BP meds this AM)    Exercises     Assessment/Plan    PT Assessment Patient needs continued PT services  PT Problem List Decreased strength;Decreased activity tolerance;Decreased balance;Decreased mobility;Decreased knowledge of use of DME;Decreased safety awareness;Decreased knowledge of precautions       PT Treatment Interventions DME instruction;Stair training;Gait training;Functional mobility training;Therapeutic activities;Therapeutic exercise;Balance training;Neuromuscular re-education;Patient/family education;Cognitive  remediation    PT Goals (Current goals can be found in the Care Plan section)  Acute Rehab PT Goals Patient Stated Goal: to return to prior level of function, mobilize independently PT Goal Formulation: With patient/family Time For Goal Achievement: 08/24/23 Potential to Achieve Goals: Fair    Frequency Min 1X/week     Co-evaluation               AM-PAC PT "6 Clicks" Mobility  Outcome Measure Help needed turning from your back to your side while in a flat bed without using bedrails?: A Little Help needed moving from lying on your back to sitting on the side of a flat bed without using bedrails?: A Little Help needed moving to and from a bed to a chair (including a wheelchair)?: A Little Help needed standing up from a chair using your arms (e.g., wheelchair or bedside chair)?: A Little Help needed to walk in hospital room?: A Little Help needed climbing 3-5 steps with a railing? : Total 6 Click Score: 16    End of Session Equipment Utilized During Treatment: Gait belt Activity Tolerance: Patient tolerated treatment well Patient left: in bed;with call bell/phone within reach;with bed alarm set;with family/visitor present Nurse Communication: Mobility status PT Visit Diagnosis: Other abnormalities of gait and mobility (R26.89);Muscle weakness (generalized) (M62.81);Other symptoms and signs involving the nervous system (R29.898)    Time: 9629-5284 PT Time Calculation (min) (ACUTE ONLY): 26 min   Charges:   PT Evaluation $PT Eval Low Complexity: 1 Low PT Treatments $Therapeutic Activity: 8-22 mins PT General Charges $$ ACUTE PT VISIT: 1 Visit         Arlyss Gandy, PT, DPT Acute Rehabilitation Office [phone]   Arlyss Gandy 08/10/2023, 10:05 AM

## 2023-08-10 NOTE — Progress Notes (Signed)
PROGRESS NOTE        PATIENT DETAILS Name: Shelby Jordan Age: 73 y.o. Sex: female Date of Birth: July 29, 1950 Admit Date: 08/08/2023 Admitting Physician Eduard Clos, MD ZOX:[id number], Wyatt Mage, FNP  Brief Summary: Patient is a 73 y.o.  female with a history of HTN, HLD, bipolar disorder, seizure disorder, CKD stage IIIa who presented with possible breakthrough seizures-admitted for further evaluation including LTM EEG.  Significant events: 1/19>> admit to [facility] 1/20>> LTM EEG removed-discharge plan-however very unsteady on feet-discharge held 1/21>> evaluated by PT-very unsteady-falling backwards-MRI brain ordered.  Significant studies: 1/18>> CT head: No acute endocrine abnormality 1/18>> CT C-spine: No fracture 1/19>> CT head: No acute intracranial abnormality. 1/19>> CXR: No pneumonia-suggestive of acute bronchitis.  Significant microbiology data: 1/19>> respiratory virus panel: Negative 1/19>> COVID PCR: Negative  Procedures: None  Consults: Neurology  Subjective: Lying comfortably in bed-no other issues overnight.  Claims she is still somewhat unsteady when she walks to the bathroom-not yet back to her prior ambulatory baseline.  Objective: Vitals: Blood pressure (!) 161/76, pulse 75, temperature 98.3 F (36.8 C), temperature source Oral, resp. rate 16, height 5\' 4"  (1.626 m), weight 94 kg, SpO2 99%.   Exam: Gen Exam:Alert awake-not in any distress HEENT:atraumatic, normocephalic Chest: B/L clear to auscultation anteriorly CVS:S1S2 regular Abdomen:soft non tender, non distended Extremities:no edema Neurology: Non focal Skin: no rash  Pertinent Labs/Radiology:    Latest Ref Rng & Units 08/09/2023    3:42 AM 08/08/2023    3:11 AM 08/07/2023    9:55 PM  CBC  WBC 4.0 - 10.5 K/uL 6.0  12.1  6.7   Hemoglobin 12.0 - 15.0 g/dL 54.0  98.1  19.1   Hematocrit 36.0 - 46.0 % 36.0  42.4  40.9   Platelets 150 - 400 K/uL 263  300  284      Lab Results  Component Value Date   NA 139 08/09/2023   K 4.0 08/09/2023   CL 103 08/09/2023   CO2 24 08/09/2023      Assessment/Plan: Suspected breakthrough seizures Seizure-free for the past several days LTM EEG Continue Depakote  Unsteady gait Falling backwards when patient worked with RN yesterday-and again with physical therapy today. Check orthostatics Get MRI brain Continue PT/OT eval SNF recommended-unsafe discharge-Home at this point.  Leukocytosis Afebrile-likely reactive in the setting of seizures. Leukocytosis resolved-no setting/clinical features of infection.  Anion gap metabolic acidosis Probably secondary to lactic acidosis-in the setting of seizures.  CKD stage IIIa At baseline  HTN BP stable Continue amlodipine/metoprolol  COPD Not in exacerbation Bronchodilators  Bipolar disorder Appears stable Depakote/Zyprexa Keep next appointment with psychiatry  Class 1 Obesity: Estimated body mass index is 35.57 kg/m as calculated from the following:   Height as of this encounter: 5\' 4"  (1.626 m).   Weight as of this encounter: 94 kg.   Code status:   Code Status: Full Code   DVT Prophylaxis: enoxaparin (LOVENOX) injection 40 mg Start: 08/08/23 1800   Family Communication: None at bedside   Disposition Plan: Status is: Inpatient Remains inpatient appropriate because: Severity of illness   Planned Discharge Destination:Home   Diet: Diet Order             Diet - low sodium heart healthy           Diet Heart Room service appropriate? Yes; Fluid consistency: Thin  Diet effective now                     Antimicrobial agents: Anti-infectives (From admission, onward)    None        MEDICATIONS: Scheduled Meds:  amLODipine  5 mg Oral Daily   divalproex  500 mg Oral BID   enoxaparin (LOVENOX) injection  40 mg Subcutaneous Q24H   magnesium gluconate  500 mg Oral QHS   metoprolol succinate  50 mg Oral Daily   mirabegron  ER  25 mg Oral Daily   mometasone-formoterol  2 puff Inhalation BID   OLANZapine  10 mg Oral QHS   potassium chloride  10 mEq Oral Daily   sodium chloride flush  3 mL Intravenous Q12H   Continuous Infusions:   PRN Meds:.acetaminophen **OR** acetaminophen, albuterol, hydrALAZINE, LORazepam, ondansetron **OR** ondansetron (ZOFRAN) IV, traZODone   I have personally reviewed following labs and imaging studies  LABORATORY DATA: CBC: Recent Labs  Lab 08/07/23 2155 08/08/23 0311 08/09/23 0342  WBC 6.7 12.1* 6.0  NEUTROABS 5.0 8.1*  --   HGB 13.6 13.6 12.0  HCT 40.9 42.4 36.0  MCV 86.8 91.6 88.2  PLT 284 300 263    Basic Metabolic Panel: Recent Labs  Lab 08/07/23 2155 08/08/23 0311 08/09/23 0342  NA 139 137 139  K 4.1 3.4* 4.0  CL 103 106 103  CO2 25 15* 24  GLUCOSE 111* 152* 101*  BUN 14 12 12   CREATININE 1.13* 1.28* 1.26*  CALCIUM 9.8 9.7 8.9  MG  --  2.0  --     GFR: Estimated Creatinine Clearance: 44.9 mL/min (A) (by C-G formula based on SCr of 1.26 mg/dL (H)).  Liver Function Tests: Recent Labs  Lab 08/08/23 0311  AST 24  ALT 19  ALKPHOS 45  BILITOT 0.7  PROT 7.3  ALBUMIN 4.3   No results for input(s): "LIPASE", "AMYLASE" in the last 168 hours. No results for input(s): "AMMONIA" in the last 168 hours.  Coagulation Profile: No results for input(s): "INR", "PROTIME" in the last 168 hours.  Cardiac Enzymes: Recent Labs  Lab 08/08/23 0348  CKTOTAL 64    BNP (last 3 results) No results for input(s): "PROBNP" in the last 8760 hours.  Lipid Profile: No results for input(s): "CHOL", "HDL", "LDLCALC", "TRIG", "CHOLHDL", "LDLDIRECT" in the last 72 hours.  Thyroid Function Tests: No results for input(s): "TSH", "T4TOTAL", "FREET4", "T3FREE", "THYROIDAB" in the last 72 hours.  Anemia Panel: No results for input(s): "VITAMINB12", "FOLATE", "FERRITIN", "TIBC", "IRON", "RETICCTPCT" in the last 72 hours.  Urine analysis:    Component Value Date/Time    COLORURINE YELLOW 12/11/2022 1612   APPEARANCEUR Clear 06/07/2023 1335   LABSPEC 1.019 12/11/2022 1612   PHURINE 6.0 12/11/2022 1612   GLUCOSEU Negative 06/07/2023 1335   GLUCOSEU NEGATIVE 07/16/2022 1459   HGBUR NEGATIVE 12/11/2022 1612   BILIRUBINUR Negative 06/07/2023 1335   KETONESUR NEGATIVE 12/11/2022 1612   PROTEINUR Negative 06/07/2023 1335   PROTEINUR 1+ (A) 12/11/2022 1612   UROBILINOGEN 0.2 02/11/2023 1440   UROBILINOGEN 0.2 07/16/2022 1459   NITRITE Negative 06/07/2023 1335   NITRITE NEGATIVE 07/16/2022 1459   LEUKOCYTESUR Negative 06/07/2023 1335   LEUKOCYTESUR NEGATIVE 07/16/2022 1459    Sepsis Labs: Lactic Acid, Venous No results found for: "LATICACIDVEN"  MICROBIOLOGY: Recent Results (from the past 240 hours)  Respiratory (~20 pathogens) panel by PCR     Status: None   Collection Time: 08/08/23  8:51 AM   Specimen: Nasopharyngeal  Swab; Respiratory  Result Value Ref Range Status   Adenovirus NOT DETECTED NOT DETECTED Final   Coronavirus 229E NOT DETECTED NOT DETECTED Final    Comment: (NOTE) The Coronavirus on the Respiratory Panel, DOES NOT test for the novel  Coronavirus (2019 nCoV)    Coronavirus HKU1 NOT DETECTED NOT DETECTED Final   Coronavirus NL63 NOT DETECTED NOT DETECTED Final   Coronavirus OC43 NOT DETECTED NOT DETECTED Final   Metapneumovirus NOT DETECTED NOT DETECTED Final   Rhinovirus / Enterovirus NOT DETECTED NOT DETECTED Final   Influenza A NOT DETECTED NOT DETECTED Final   Influenza B NOT DETECTED NOT DETECTED Final   Parainfluenza Virus 1 NOT DETECTED NOT DETECTED Final   Parainfluenza Virus 2 NOT DETECTED NOT DETECTED Final   Parainfluenza Virus 3 NOT DETECTED NOT DETECTED Final   Parainfluenza Virus 4 NOT DETECTED NOT DETECTED Final   Respiratory Syncytial Virus NOT DETECTED NOT DETECTED Final   Bordetella pertussis NOT DETECTED NOT DETECTED Final   Bordetella Parapertussis NOT DETECTED NOT DETECTED Final   Chlamydophila  pneumoniae NOT DETECTED NOT DETECTED Final   Mycoplasma pneumoniae NOT DETECTED NOT DETECTED Final    Comment: Performed at [facility], [address], [place], [place] [address]  SARS Coronavirus 2 by RT PCR (hospital order, performed in [facility] hospital lab) *cepheid single result test* Anterior Nasal Swab     Status: None   Collection Time: 08/08/23  8:51 AM   Specimen: Anterior Nasal Swab  Result Value Ref Range Status   SARS Coronavirus 2 by RT PCR NEGATIVE NEGATIVE Final    Comment: Performed at [facility], [address], [place], [place] [address]    RADIOLOGY STUDIES/RESULTS: Overnight EEG with video Result Date: 08/09/2023 Charlsie Quest, MD     08/09/2023 10:04 AM Patient Name: Nayanna Karam MRN: [id number] Epilepsy Attending: Charlsie Quest Referring Physician/Provider: Erick Blinks, MD Duration: 08/08/2023 0865 to 08/09/2023 7846 Patient history: 73 y.o. female with hx of depression, Bipolar 1, Hep C, HTN, who presents with multiple episodes concerning for seizures after running out of Depakote a month ago which she uses for mood. EEG to evaluate for seizure Level of alertness: Awake, asleep AEDs during EEG study: VPA Technical aspects: This EEG study was done with scalp electrodes positioned according to the 10-20 International system of electrode placement. Electrical activity was reviewed with band pass filter of 1-70Hz , sensitivity of 7 uV/mm, display speed of 82mm/sec with a 60Hz  notched filter applied as appropriate. EEG data were recorded continuously and digitally stored.  Video monitoring was available and reviewed as appropriate. Description: The posterior dominant rhythm consists of 8-9 Hz activity of moderate voltage (25-35 uV) seen predominantly in posterior head regions, symmetric and reactive to eye opening and eye closing. Sleep was characterized by vertex waves, sleep spindles (12 to 14 Hz), maximal frontocentral region.  Hyperventilation and  photic stimulation were not performed.   IMPRESSION: This study is within normal limits. No seizures or epileptiform discharges were seen throughout the recording. A normal interictal EEG does not exclude the diagnosis of epilepsy. Priyanka [name]     LOS: 1 day   Jeoffrey Massed, MD  [facility]    To contact the attending provider between 7A-7P or the covering provider during after hours 7P-7A, please log into the web site [email or web address] and access using universal [place] password for that web site. If you do not have the password, please call the hospital operator.  08/10/2023,  9:55 AM

## 2023-08-10 NOTE — Plan of Care (Signed)

## 2023-08-10 NOTE — Evaluation (Signed)
Occupational Therapy Evaluation Patient Details Name: Shelby Jordan MRN: [id number] DOB: [birth year] Today's Date: 08/10/2023   History of Present Illness 73 y.o. female presents to [facility] hospital on 08/07/2023 with concern for breakthrough seizures. PMH includes HTN, HLD, bipolar disorder, CKD III.   Clinical Impression   Grand daughter present initially and states her grandmother has "early dementia", therefore they assist with with IADL tasks and provide supervision for ADL. At baseline, Shelby Jordan is independent with mobility. Pt currently requires mod A at times with mobility @ RW level due to posterior LOB and requires min A with ADL tasks with the exception of total A (incontinent) with hygiene after toileting due to deficits listed below. Patient will benefit from continued inpatient follow up therapy, <3 hours/day. Acute OT to follow to maximize functional level of independence and facilitate DC to next venue.  Orthostatic BPs  Supine 151/72  Sitting 151/101     Standing 151/126      Pt complaining of BP cuff causing intense pain and pt notably holding her breath.       If plan is discharge home, recommend the following: A lot of help with walking and/or transfers;A lot of help with bathing/dressing/bathroom;Assistance with cooking/housework;Direct supervision/assist for medications management;Direct supervision/assist for financial management;Assist for transportation;Help with stairs or ramp for entrance    Functional Status Assessment  Patient has had a recent decline in their functional status and demonstrates the ability to make significant improvements in function in a reasonable and predictable amount of time.  Equipment Recommendations  BSC/3in1    Recommendations for Other Services       Precautions / Restrictions Precautions Precautions: Fall;Other (comment) (seizures) Restrictions Weight Bearing Restrictions Per Provider Order: No      Mobility Bed  Mobility Overal bed mobility: Needs Assistance Bed Mobility: Supine to Sit, Sit to Supine     Supine to sit: Supervision          Transfers Overall transfer level: Needs assistance Equipment used: Rolling walker (2 wheels) Transfers: Sit to/from Stand Sit to Stand: Min assist           General transfer comment: posterior lean; falling backwards onto bed at times      Balance Overall balance assessment: Needs assistance Sitting-balance support: No upper extremity supported, Feet supported Sitting balance-Leahy Scale: Fair       Standing balance-Leahy Scale: Poor Standing balance comment: posterior lean                           ADL either performed or assessed with clinical judgement   ADL Overall ADL's : Needs assistance/impaired Eating/Feeding: Set up   Grooming: Set up;Sitting   Upper Body Bathing: Set up;Sitting;Supervision/ safety   Lower Body Bathing: Minimal assistance;Sit to/from stand   Upper Body Dressing : Minimal assistance;Sitting   Lower Body Dressing: Minimal assistance;Sit to/from stand   Toilet Transfer: Moderate assistance;Ambulation;Rolling walker (2 wheels)   Toileting- Clothing Manipulation and Hygiene: Total assistance Toileting - Clothing Manipulation Details (indicate cue type and reason): incontinent     Functional mobility during ADLs: Moderate assistance (falls posteriorly at times; unaware)       Vision Baseline Vision/History: 1 Wears glasses Ability to See in Adequate Light: 0 Adequate Vision Assessment?: No apparent visual deficits;Wears glasses for reading     Perception         Praxis         Pertinent Vitals/Pain Pain Assessment Pain Assessment: No/denies pain  Extremity/Trunk Assessment Upper Extremity Assessment Upper Extremity Assessment: Generalized weakness (L shoulder slightly weaker however functional; no focal deficits noted)   Lower Extremity Assessment Lower Extremity Assessment:  Defer to PT evaluation   Cervical / Trunk Assessment Cervical / Trunk Assessment: Other exceptions (posterioro bias)   Communication Communication Communication: No apparent difficulties   Cognition Arousal: Alert (brief periods of lethargy) Behavior During Therapy: Impulsive Overall Cognitive Status: Impaired/Different from baseline Area of Impairment: Attention, Memory, Following commands, Safety/judgement, Awareness, Problem solving                   Current Attention Level: Sustained Memory: Decreased recall of precautions, Decreased short-term memory Following Commands: Follows one step commands consistently Safety/Judgement: Decreased awareness of safety, Decreased awareness of deficits Awareness: Emergent Problem Solving: Slow processing       General Comments  VSS    Exercises     Shoulder Instructions      Home Living Family/patient expects to be discharged to:: Private residence Living Arrangements: Children;Other relatives Available Help at Discharge: Family;Available 24 hours/day Type of Home: House Home Access: Stairs to enter [employer] of Steps: 1 Entrance Stairs-Rails: None Home Layout: Two level Alternate Level Stairs-Number of Steps: flight Alternate Level Stairs-Rails: Can reach both Bathroom Shower/Tub: Chief Strategy Officer: Standard Bathroom Accessibility: Yes How Accessible: Accessible via walker Home Equipment: Shower seat          Prior Functioning/Environment Prior Level of Function : Needs assist  Cognitive Assist : ADLs (cognitive)   ADLs (Cognitive): Intermittent cues       Mobility Comments: ambulatory without DME ADLs Comments: assist for transportation, medications, IADLs; family S bathing and dressing due to "memory" problems        OT Problem List: Decreased strength;Decreased activity tolerance;Impaired balance (sitting and/or standing);Decreased cognition;Decreased safety awareness       OT Treatment/Interventions: Self-care/ADL training;Therapeutic exercise;DME and/or AE instruction;Therapeutic activities;Cognitive remediation/compensation;Patient/family education;Balance training    OT Goals(Current goals can be found in the care plan section) Acute Rehab OT Goals Patient Stated Goal: to get some rest OT Goal Formulation: With patient Time For Goal Achievement: 08/24/23 Potential to Achieve Goals: Good  OT Frequency: Min 1X/week    Co-evaluation              AM-PAC OT "6 Clicks" Daily Activity     Outcome Measure Help from another person eating meals?: A Little Help from another person taking care of personal grooming?: A Little Help from another person toileting, which includes using toliet, bedpan, or urinal?: Total Help from another person bathing (including washing, rinsing, drying)?: A Little Help from another person to put on and taking off regular upper body clothing?: A Little Help from another person to put on and taking off regular lower body clothing?: A Little 6 Click Score: 16   End of Session Equipment Utilized During Treatment: Gait belt;Rolling walker (2 wheels) Nurse Communication: Mobility status  Activity Tolerance: Patient tolerated treatment well Patient left: in bed;with call bell/phone within reach;with bed alarm set  OT Visit Diagnosis: Unsteadiness on feet (R26.81);Other abnormalities of gait and mobility (R26.89);Muscle weakness (generalized) (M62.81);Other symptoms and signs involving cognitive function                Time: 1252-1315 OT Time Calculation (min): 23 min Charges:  OT General Charges $OT Visit: 1 Visit OT Evaluation $OT Eval Moderate Complexity: 1 Mod OT Treatments $Self Care/Home Management : 8-22 mins  [facility], OT/L   Acute  OT Clinical Specialist Acute Rehabilitation Services Pager [phone] Office [phone]   [facility] 08/10/2023, 2:34 PM

## 2023-08-10 NOTE — [place] FL2 (Signed)
[place] MEDICAID FL2 LEVEL OF CARE FORM     IDENTIFICATION  Patient Name: Shelby Jordan Birthdate: 1950-09-29 Sex: female Admission Date (Current Location): 08/08/2023  [facility] and [place] Number:  Producer, television/film/video and Address:  [facility], [address], [place], [place] [address]      Provider Number: [id number]  Attending Physician Name and Address:  Maretta Bees, MD  Relative Name and Phone Number:       Current Level of Care: Hospital Recommended Level of Care: Skilled Nursing Facility Prior Approval Number:    Date Approved/Denied:   PASRR Number: pending  Discharge Plan: SNF    Current Diagnoses: Patient Active Problem List   Diagnosis Date Noted   Seizure disorder (HCC) 08/10/2023   Seizure (HCC) 08/08/2023   Leukocytosis 08/08/2023   Metabolic acidosis with increased anion gap and accumulation of organic acids 08/08/2023   Hypokalemia 08/08/2023   Poor personal hygiene 04/19/2023   Tobacco abuse 04/15/2023   Urge incontinence of urine 02/09/2023   Moderate persistent asthma 12/15/2022   Vitamin D deficiency 12/15/2022   Primary hypertension 12/15/2022   Mixed stress and urge urinary incontinence 12/10/2022   Bipolar 1 disorder (HCC) 07/20/2022   Self-care deficit for dressing and grooming 06/28/2022   At risk for self care deficit 06/28/2022   Uterine leiomyoma 05/07/2022   Atherosclerosis of aorta (HCC) 05/04/2022   Bilateral inguinal hernia without obstruction or gangrene 05/04/2022   Abnormal MMSE 03/05/2022   Memory loss 03/05/2022   Osteopenia of left hip 02/02/2022   Schizoaffective disorder, bipolar type (HCC) 11/28/2021   Hyperglycemia 11/28/2021   History of hepatitis C 11/28/2021   Chronic kidney disease, stage 3a (HCC) 11/28/2021   Obesity (BMI 35.0-39.9 without comorbidity) 11/28/2021   Chronic obstructive pulmonary disease (HCC) 11/28/2021   Mixed hyperlipidemia 11/28/2021   Post-menopausal  11/28/2021   Left lower quadrant abdominal mass 11/28/2021   History of tobacco abuse 11/28/2021   Bipolar I disorder, most recent episode (or current) manic (HCC) 12/15/2019   Essential hypertension 12/15/2019    Orientation RESPIRATION BLADDER Height & Weight     Self, Time, Situation, Place  Normal Continent, External catheter Weight: 207 lb 3.7 oz (94 kg) Height:  5\' 4"  (162.6 cm)  BEHAVIORAL SYMPTOMS/MOOD NEUROLOGICAL BOWEL NUTRITION STATUS      Continent Diet (See dc summary)  AMBULATORY STATUS COMMUNICATION OF NEEDS Skin   Limited Assist Verbally Normal                       Personal Care Assistance Level of Assistance  Bathing, Feeding, Dressing Bathing Assistance: Limited assistance Feeding assistance: Independent Dressing Assistance: Limited assistance     Functional Limitations Info             SPECIAL CARE FACTORS FREQUENCY  PT (By licensed PT), OT (By licensed OT)     PT Frequency: 5x/week OT Frequency: 5x/week            Contractures Contractures Info: Not present    Additional Factors Info  Code Status, Allergies Code Status Info: Full Allergies Info: Tomato (Diagnostic), Citrus           Current Medications (08/10/2023):  This is the current hospital active medication list Current Facility-Administered Medications  Medication Dose Route Frequency Provider Last Rate Last Admin   acetaminophen (TYLENOL) tablet 650 mg  650 mg Oral Q6H PRN Madelyn Flavors A, MD   650 mg at 08/10/23 [phone]  Or   acetaminophen (TYLENOL) suppository 650 mg  650 mg Rectal Q6H PRN Madelyn Flavors A, MD       albuterol (PROVENTIL) (2.5 MG/3ML) 0.083% nebulizer solution 2.5 mg  2.5 mg Nebulization Q6H PRN Katrinka Blazing, Rondell A, MD       amLODipine (NORVASC) tablet 5 mg  5 mg Oral Daily Katrinka Blazing, Rondell A, MD   5 mg at 08/10/23 0941   divalproex (DEPAKOTE) DR tablet 500 mg  500 mg Oral BID Madelyn Flavors A, MD   500 mg at 08/10/23 0941   enoxaparin (LOVENOX) injection 40 mg   40 mg Subcutaneous Q24H Madelyn Flavors A, MD   40 mg at 08/09/23 1742   hydrALAZINE (APRESOLINE) injection 10 mg  10 mg Intravenous Q4H PRN Madelyn Flavors A, MD       LORazepam (ATIVAN) injection 1 mg  1 mg Intravenous PRN Mesner, Barbara Cower, MD       magnesium gluconate (MAGONATE) tablet 500 mg  500 mg Oral QHS Smith, Rondell A, MD   500 mg at 08/09/23 2158   metoprolol succinate (TOPROL-XL) 24 hr tablet 50 mg  50 mg Oral Daily Katrinka Blazing, Rondell A, MD   50 mg at 08/10/23 0941   mirabegron ER (MYRBETRIQ) tablet 25 mg  25 mg Oral Daily Smith, Rondell A, MD   25 mg at 08/10/23 0941   mometasone-formoterol (DULERA) 100-5 MCG/ACT inhaler 2 puff  2 puff Inhalation BID Madelyn Flavors A, MD   2 puff at 08/10/23 0926   OLANZapine (ZYPREXA) tablet 10 mg  10 mg Oral QHS Smith, Rondell A, MD   10 mg at 08/09/23 2159   ondansetron (ZOFRAN) tablet 4 mg  4 mg Oral Q6H PRN Madelyn Flavors A, MD       Or   ondansetron (ZOFRAN) injection 4 mg  4 mg Intravenous Q6H PRN Smith, Rondell A, MD       potassium chloride (KLOR-CON M) CR tablet 10 mEq  10 mEq Oral Daily Smith, Rondell A, MD   10 mEq at 08/10/23 0941   sodium chloride flush (NS) 0.9 % injection 3 mL  3 mL Intravenous Q12H Smith, Rondell A, MD   3 mL at 08/10/23 0942   traZODone (DESYREL) tablet 50 mg  50 mg Oral QHS PRN Clydie Braun, MD   50 mg at 08/09/23 2159     Discharge Medications: Please see discharge summary for a list of discharge medications.  Relevant Imaging Results:  Relevant Lab Results:   Additional Information SSN: [id number] [place], [place]

## 2023-08-11 ENCOUNTER — Inpatient Hospital Stay (HOSPITAL_COMMUNITY): Payer: 59

## 2023-08-11 ENCOUNTER — Other Ambulatory Visit (HOSPITAL_COMMUNITY): Payer: Self-pay

## 2023-08-11 DIAGNOSIS — R569 Unspecified convulsions: Secondary | ICD-10-CM | POA: Diagnosis not present

## 2023-08-11 DIAGNOSIS — E876 Hypokalemia: Secondary | ICD-10-CM | POA: Diagnosis not present

## 2023-08-11 DIAGNOSIS — I10 Essential (primary) hypertension: Secondary | ICD-10-CM | POA: Diagnosis not present

## 2023-08-11 DIAGNOSIS — D72829 Elevated white blood cell count, unspecified: Secondary | ICD-10-CM | POA: Diagnosis not present

## 2023-08-11 MED ORDER — ENSURE ENLIVE PO LIQD
237.0000 mL | Freq: Two times a day (BID) | ORAL | Status: DC
  Administered 2023-08-11 – 2023-08-12 (×2): 237 mL via ORAL

## 2023-08-11 MED ORDER — LOPERAMIDE HCL 2 MG PO CAPS
2.0000 mg | ORAL_CAPSULE | ORAL | Status: DC | PRN
  Administered 2023-08-11: 2 mg via ORAL
  Filled 2023-08-11: qty 1

## 2023-08-11 NOTE — Progress Notes (Signed)
Physical Therapy Treatment Patient Details Name: Shelby Jordan MRN: [id number] DOB: 09-01-1950 Today's Date: 08/11/2023   History of Present Illness 73 y.o. female presents to [facility] hospital on 08/07/2023 with concern for breakthrough seizures. PMH includes HTN, HLD, bipolar disorder, CKD III.    PT Comments  Pt received in supine and agreeable to session. Pt demonstrates improved sitting balance at EOB, however continues to have a posterior lean intermittently. Pt requires dense cues for safety and sequencing throughout session, however has difficulty following during tasks. Pt demonstrates quick, shuffled steps with low foot clearance during gait trial causing increased instability and is unable to correct with cues. Pt continues to benefit from PT services to progress toward functional mobility goals.    If plan is discharge home, recommend the following: A lot of help with walking and/or transfers;A lot of help with bathing/dressing/bathroom;Assistance with cooking/housework;Direct supervision/assist for medications management;Direct supervision/assist for financial management;Assist for transportation;Help with stairs or ramp for entrance;Supervision due to cognitive status   Can travel by private vehicle     Yes  Equipment Recommendations  Rolling walker (2 wheels);BSC/3in1    Recommendations for Other Services       Precautions / Restrictions Precautions Precautions: Fall;Other (comment) Precaution Comments: seizures Restrictions Weight Bearing Restrictions Per Provider Order: No     Mobility  Bed Mobility Overal bed mobility: Needs Assistance Bed Mobility: Supine to Sit     Supine to sit: Supervision, HOB elevated, Used rails     General bed mobility comments: increased time and effort. Min A to scoot forward to EOB    Transfers Overall transfer level: Needs assistance Equipment used: Rolling walker (2 wheels) Transfers: Sit to/from Stand Sit to Stand: Min assist            General transfer comment: Min A due to posterior bias and cues for hand placement and safety due to impulsivity    Ambulation/Gait Ambulation/Gait assistance: Min assist Gait Distance (Feet): 35 Feet Assistive device: Rolling walker (2 wheels) Gait Pattern/deviations: Shuffle, Decreased stride length, Step-through pattern, Trunk flexed     Pre-gait activities: static standing marches at [employer] Gait Details: quick, shuffled steps despite cues. Decreased foot clearance with progressed distance and fatigue. Cues for RW proximity. increased instability during turns. intermittent min A for balance due to posterior lean       Balance Overall balance assessment: Needs assistance Sitting-balance support: No upper extremity supported, Feet supported Sitting balance-Leahy Scale: Fair Sitting balance - Comments: sitting EOB with one posterior lean when focused on donning second gown, but pt able to correct with cues   Standing balance support: Bilateral upper extremity supported, Reliant on assistive device for balance, During functional activity Standing balance-Leahy Scale: Poor Standing balance comment: with RW support                            Cognition Arousal: Alert Behavior During Therapy: Impulsive Overall Cognitive Status: Impaired/Different from baseline                                 General Comments: decreased awareness and impaired problem solving        Exercises      General Comments        Pertinent Vitals/Pain Pain Assessment Pain Assessment: No/denies pain     PT Goals (current goals can now be found in the care plan section) Acute Rehab  PT Goals Patient Stated Goal: to return to prior level of function, mobilize independently PT Goal Formulation: With patient/family Time For Goal Achievement: 08/24/23 Progress towards PT goals: Progressing toward goals    Frequency    Min 1X/week       AM-PAC PT "6  Clicks" Mobility   Outcome Measure  Help needed turning from your back to your side while in a flat bed without using bedrails?: A Little Help needed moving from lying on your back to sitting on the side of a flat bed without using bedrails?: A Little Help needed moving to and from a bed to a chair (including a wheelchair)?: A Little Help needed standing up from a chair using your arms (e.g., wheelchair or bedside chair)?: A Little Help needed to walk in hospital room?: A Little Help needed climbing 3-5 steps with a railing? : Total 6 Click Score: 16    End of Session Equipment Utilized During Treatment: Gait belt Activity Tolerance: Patient tolerated treatment well Patient left: with call bell/phone within reach;with family/visitor present;in chair;with chair alarm set Nurse Communication: Mobility status PT Visit Diagnosis: Other abnormalities of gait and mobility (R26.89);Muscle weakness (generalized) (M62.81);Other symptoms and signs involving the nervous system (R29.898)     Time: 5621-3086 PT Time Calculation (min) (ACUTE ONLY): 23 min  Charges:    $Gait Training: 8-22 mins $Therapeutic Activity: 8-22 mins PT General Charges $$ ACUTE PT VISIT: 1 Visit                     Johny Shock, PTA Acute Rehabilitation Services Secure Chat Preferred  [phone]    Johny Shock 08/11/2023, 10:27 AM

## 2023-08-11 NOTE — Progress Notes (Signed)
PROGRESS NOTE        PATIENT DETAILS Name: Shelby Jordan Age: 73 y.o. Sex: female Date of Birth: 03-Apr-1951 Admit Date: 08/08/2023 Admitting Physician Dewayne Shorter Levora Dredge, MD WGN:[id number], Wyatt Mage, FNP  Brief Summary: Patient is a 73 y.o.  female with a history of HTN, HLD, bipolar disorder, seizure disorder, CKD stage IIIa who presented with possible breakthrough seizures-admitted for further evaluation including LTM EEG.  Significant events: 1/19>> admit to [facility] 1/20>> LTM EEG removed-discharge plan-however very unsteady on feet-discharge held 1/21>> evaluated by PT-very unsteady-falling backwards-MRI brain ordered-negative for acute CVA.  Significant studies: 1/18>> CT head: No acute endocrine abnormality 1/18>> CT C-spine: No fracture 1/19>> CT head: No acute intracranial abnormality. 1/19>> CXR: No pneumonia-suggestive of acute bronchitis. 1/21>> MRI brain: No acute CVA.  Significant microbiology data: 1/19>> respiratory virus panel: Negative 1/19>> COVID PCR: Negative  Procedures: None  Consults: Neurology  Subjective: Mild low-grade fever overnight but no complaints.  Objective: Vitals: Blood pressure (!) 124/57, pulse 85, temperature (!) 100.8 F (38.2 C), temperature source Oral, resp. rate 18, height 5\' 4"  (1.626 m), weight 94 kg, SpO2 95%.   Exam: Gen Exam:Alert awake-not in any distress HEENT:atraumatic, normocephalic Chest: B/L clear to auscultation anteriorly CVS:S1S2 regular Abdomen:soft non tender, non distended Extremities:no edema Neurology: Non focal Skin: no rash  Pertinent Labs/Radiology:    Latest Ref Rng & Units 08/09/2023    3:42 AM 08/08/2023    3:11 AM 08/07/2023    9:55 PM  CBC  WBC 4.0 - 10.5 K/uL 6.0  12.1  6.7   Hemoglobin 12.0 - 15.0 g/dL 08.6  57.8  46.9   Hematocrit 36.0 - 46.0 % 36.0  42.4  40.9   Platelets 150 - 400 K/uL 263  300  284     Lab Results  Component Value Date   NA 139 08/09/2023   K  4.0 08/09/2023   CL 103 08/09/2023   CO2 24 08/09/2023      Assessment/Plan: Suspected breakthrough seizures Seizure-free for the past several days LTM EEG negative for seizures Continue Depakote  Unsteady gait Suspected to be related to debility-patient was essentially bedbound while getting LTM EEG MRI brain negative for CVA Orthostatic vital signs negative Continue to mobilize with PT/OT-SNF planned on discharge.    Leukocytosis Leukocytosis resolved-low-grade fever overnight but patient appearing nontoxic Continue to monitor closely-will initiate workup if she spikes a fever > 101  Anion gap metabolic acidosis Probably secondary to lactic acidosis-in the setting of seizures.  CKD stage IIIa At baseline  HTN BP stable Continue amlodipine/metoprolol  COPD Not in exacerbation Bronchodilators  Bipolar disorder Appears stable Depakote/Zyprexa Keep next appointment with psychiatry  Class 1 Obesity: Estimated body mass index is 35.57 kg/m as calculated from the following:   Height as of this encounter: 5\' 4"  (1.626 m).   Weight as of this encounter: 94 kg.   Code status:   Code Status: Full Code   DVT Prophylaxis: enoxaparin (LOVENOX) injection 40 mg Start: 08/08/23 1800   Family Communication: None at bedside   Disposition Plan: Status is: Inpatient Remains inpatient appropriate because: Severity of illness   Planned Discharge Destination:Home   Diet: Diet Order             Diet - low sodium heart healthy           Diet Heart Room service  appropriate? Yes; Fluid consistency: Thin  Diet effective now                     Antimicrobial agents: Anti-infectives (From admission, onward)    None        MEDICATIONS: Scheduled Meds:  amLODipine  5 mg Oral Daily   divalproex  500 mg Oral BID   enoxaparin (LOVENOX) injection  40 mg Subcutaneous Q24H   magnesium gluconate  500 mg Oral QHS   metoprolol succinate  50 mg Oral Daily    mirabegron ER  25 mg Oral Daily   mometasone-formoterol  2 puff Inhalation BID   OLANZapine  10 mg Oral QHS   potassium chloride  10 mEq Oral Daily   sodium chloride flush  3 mL Intravenous Q12H   Continuous Infusions:   PRN Meds:.acetaminophen **OR** acetaminophen, albuterol, hydrALAZINE, LORazepam, ondansetron **OR** ondansetron (ZOFRAN) IV, traZODone   I have personally reviewed following labs and imaging studies  LABORATORY DATA: CBC: Recent Labs  Lab 08/07/23 2155 08/08/23 0311 08/09/23 0342  WBC 6.7 12.1* 6.0  NEUTROABS 5.0 8.1*  --   HGB 13.6 13.6 12.0  HCT 40.9 42.4 36.0  MCV 86.8 91.6 88.2  PLT 284 300 263    Basic Metabolic Panel: Recent Labs  Lab 08/07/23 2155 08/08/23 0311 08/09/23 0342  NA 139 137 139  K 4.1 3.4* 4.0  CL 103 106 103  CO2 25 15* 24  GLUCOSE 111* 152* 101*  BUN 14 12 12   CREATININE 1.13* 1.28* 1.26*  CALCIUM 9.8 9.7 8.9  MG  --  2.0  --     GFR: Estimated Creatinine Clearance: 44.9 mL/min (A) (by C-G formula based on SCr of 1.26 mg/dL (H)).  Liver Function Tests: Recent Labs  Lab 08/08/23 0311  AST 24  ALT 19  ALKPHOS 45  BILITOT 0.7  PROT 7.3  ALBUMIN 4.3   No results for input(s): "LIPASE", "AMYLASE" in the last 168 hours. No results for input(s): "AMMONIA" in the last 168 hours.  Coagulation Profile: No results for input(s): "INR", "PROTIME" in the last 168 hours.  Cardiac Enzymes: Recent Labs  Lab 08/08/23 0348  CKTOTAL 64    BNP (last 3 results) No results for input(s): "PROBNP" in the last 8760 hours.  Lipid Profile: No results for input(s): "CHOL", "HDL", "LDLCALC", "TRIG", "CHOLHDL", "LDLDIRECT" in the last 72 hours.  Thyroid Function Tests: No results for input(s): "TSH", "T4TOTAL", "FREET4", "T3FREE", "THYROIDAB" in the last 72 hours.  Anemia Panel: No results for input(s): "VITAMINB12", "FOLATE", "FERRITIN", "TIBC", "IRON", "RETICCTPCT" in the last 72 hours.  Urine analysis:    Component  Value Date/Time   COLORURINE YELLOW 12/11/2022 1612   APPEARANCEUR Clear 06/07/2023 1335   LABSPEC 1.019 12/11/2022 1612   PHURINE 6.0 12/11/2022 1612   GLUCOSEU Negative 06/07/2023 1335   GLUCOSEU NEGATIVE 07/16/2022 1459   HGBUR NEGATIVE 12/11/2022 1612   BILIRUBINUR Negative 06/07/2023 1335   KETONESUR NEGATIVE 12/11/2022 1612   PROTEINUR Negative 06/07/2023 1335   PROTEINUR 1+ (A) 12/11/2022 1612   UROBILINOGEN 0.2 02/11/2023 1440   UROBILINOGEN 0.2 07/16/2022 1459   NITRITE Negative 06/07/2023 1335   NITRITE NEGATIVE 07/16/2022 1459   LEUKOCYTESUR Negative 06/07/2023 1335   LEUKOCYTESUR NEGATIVE 07/16/2022 1459    Sepsis Labs: Lactic Acid, Venous No results found for: "LATICACIDVEN"  MICROBIOLOGY: Recent Results (from the past 240 hours)  Respiratory (~20 pathogens) panel by PCR     Status: None   Collection Time: 08/08/23  8:51 AM   Specimen: Nasopharyngeal Swab; Respiratory  Result Value Ref Range Status   Adenovirus NOT DETECTED NOT DETECTED Final   Coronavirus 229E NOT DETECTED NOT DETECTED Final    Comment: (NOTE) The Coronavirus on the Respiratory Panel, DOES NOT test for the novel  Coronavirus (2019 nCoV)    Coronavirus HKU1 NOT DETECTED NOT DETECTED Final   Coronavirus NL63 NOT DETECTED NOT DETECTED Final   Coronavirus OC43 NOT DETECTED NOT DETECTED Final   Metapneumovirus NOT DETECTED NOT DETECTED Final   Rhinovirus / Enterovirus NOT DETECTED NOT DETECTED Final   Influenza A NOT DETECTED NOT DETECTED Final   Influenza B NOT DETECTED NOT DETECTED Final   Parainfluenza Virus 1 NOT DETECTED NOT DETECTED Final   Parainfluenza Virus 2 NOT DETECTED NOT DETECTED Final   Parainfluenza Virus 3 NOT DETECTED NOT DETECTED Final   Parainfluenza Virus 4 NOT DETECTED NOT DETECTED Final   Respiratory Syncytial Virus NOT DETECTED NOT DETECTED Final   Bordetella pertussis NOT DETECTED NOT DETECTED Final   Bordetella Parapertussis NOT DETECTED NOT DETECTED Final    Chlamydophila pneumoniae NOT DETECTED NOT DETECTED Final   Mycoplasma pneumoniae NOT DETECTED NOT DETECTED Final    Comment: Performed at [facility], [address], [place], [place] [address]  SARS Coronavirus 2 by RT PCR (hospital order, performed in [facility] hospital lab) *cepheid single result test* Anterior Nasal Swab     Status: None   Collection Time: 08/08/23  8:51 AM   Specimen: Anterior Nasal Swab  Result Value Ref Range Status   SARS Coronavirus 2 by RT PCR NEGATIVE NEGATIVE Final    Comment: Performed at [facility], [address], [place], [place] [address]    RADIOLOGY STUDIES/RESULTS: MR BRAIN WO CONTRAST Result Date: 08/10/2023 CLINICAL DATA:  Seizure, new onset EXAM: MRI HEAD WITHOUT CONTRAST TECHNIQUE: Multiplanar, multiecho pulse sequences of the brain and surrounding structures were obtained without intravenous contrast. COMPARISON:  Head CT 08/08/2023 FINDINGS: Brain: Diffusion imaging does not show any acute or subacute infarction or other cause of restricted diffusion. No focal abnormality affects the brainstem. 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 Cerebral hemispheres show an old infarction in the left basal ganglia and moderate to pronounced chronic small-vessel ischemic changes of the deep and subcortical white matter. No cortical or large vessel territory infarction. No mass lesion, hemorrhage, hydrocephalus or extra-axial collection. Mesial temporal lobes are symmetric. Vascular: Major vessels at the base of the brain show flow. Skull and upper cervical spine: Negative Sinuses/Orbits: Clear/normal Other: None IMPRESSION: No acute or reversible finding. 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 Moderate to pronounced chronic small-vessel ischemic changes of the deep and subcortical white matter of both hemispheres. Electronically Signed   By: Paulina Fusi M.D.   On: 08/10/2023 15:13      LOS: 2 days   Jeoffrey Massed, MD  [facility]    To contact the attending provider between 7A-7P or the covering provider during after hours 7P-7A, please log into the web site [email or web address] and access using universal [place] password for that web site. If you do not have the password, please call the hospital operator.  08/11/2023, 10:14 AM

## 2023-08-11 NOTE — Plan of Care (Signed)
  Problem: Health Behavior/Discharge Planning: Goal: Ability to manage health-related needs will improve Outcome: Progressing   Problem: Clinical Measurements: Goal: Will remain free from infection Outcome: Progressing Goal: Diagnostic test results will improve Outcome: Progressing   Problem: Activity: Goal: Risk for activity intolerance will decrease Outcome: Progressing   Problem: Nutrition: Goal: Adequate nutrition will be maintained Outcome: Progressing   Problem: Coping: Goal: Level of anxiety will decrease Outcome: Progressing   Problem: Safety: Goal: Ability to remain free from injury will improve Outcome: Progressing

## 2023-08-11 NOTE — TOC Progression Note (Addendum)
Transition of Care [facility]) - Progression Note    Patient Details  Name: Shelby Jordan MRN: [id number] Date of Birth: [birth year]  Transition of Care [facility] CM/SW Contact  Mearl Latin, LCSW Phone Number: 08/11/2023, 9:57 AM  Clinical Narrative:    9:57am-MSW Intern contacted patient's daughter to obtain SNF choice. Daughter requested [employer].   CSW placed pasrr on FL2 and spoke with Shay at [facility]. She reported they do have a bed for patient. CSW updated insurance on bed choice and MRI results. Insurance authorization pending.   1:51 PM-Insurance approval received, Ref# [id number], effective 08/11/2023-08/13/2023. CSW confirmed [facility] is able to accept patient tomorrow if stable.   Expected Discharge Plan: Skilled Nursing Facility Barriers to Discharge: Continued Medical Work up  Expected Discharge Plan and Services In-house Referral: Clinical Social Work   Post Acute Care Choice: Skilled Nursing Facility Living arrangements for the past 2 months: Single Family Home Expected Discharge Date: 08/09/23                                     Social Determinants of Health (SDOH) Interventions SDOH Screenings   Food Insecurity: Patient Unable To Answer (08/09/2023)  Housing: Patient Unable To Answer (08/09/2023)  Transportation Needs: Patient Unable To Answer (08/09/2023)  Utilities: Patient Unable To Answer (08/09/2023)  Alcohol Screen: Low Risk  (07/20/2022)  Depression (PHQ2-9): Low Risk  (04/15/2023)  Financial Resource Strain: Low Risk  (05/13/2022)  Physical Activity: Inactive (05/13/2022)  Social Connections: Patient Unable To Answer (08/09/2023)  Stress: No Stress Concern Present (05/13/2022)  Tobacco Use: Medium Risk (08/08/2023)    Readmission Risk Interventions     No data to display

## 2023-08-12 ENCOUNTER — Other Ambulatory Visit (HOSPITAL_COMMUNITY): Payer: Self-pay

## 2023-08-12 DIAGNOSIS — J45998 Other asthma: Secondary | ICD-10-CM | POA: Diagnosis not present

## 2023-08-12 DIAGNOSIS — R197 Diarrhea, unspecified: Secondary | ICD-10-CM | POA: Diagnosis not present

## 2023-08-12 DIAGNOSIS — R6889 Other general symptoms and signs: Secondary | ICD-10-CM | POA: Diagnosis not present

## 2023-08-12 DIAGNOSIS — N1831 Chronic kidney disease, stage 3a: Secondary | ICD-10-CM | POA: Diagnosis not present

## 2023-08-12 DIAGNOSIS — E8721 Acute metabolic acidosis: Secondary | ICD-10-CM | POA: Diagnosis not present

## 2023-08-12 DIAGNOSIS — J449 Chronic obstructive pulmonary disease, unspecified: Secondary | ICD-10-CM | POA: Diagnosis not present

## 2023-08-12 DIAGNOSIS — D72829 Elevated white blood cell count, unspecified: Secondary | ICD-10-CM | POA: Diagnosis not present

## 2023-08-12 DIAGNOSIS — I10 Essential (primary) hypertension: Secondary | ICD-10-CM | POA: Diagnosis not present

## 2023-08-12 DIAGNOSIS — Z7401 Bed confinement status: Secondary | ICD-10-CM | POA: Diagnosis not present

## 2023-08-12 DIAGNOSIS — E872 Acidosis, unspecified: Secondary | ICD-10-CM | POA: Diagnosis not present

## 2023-08-12 DIAGNOSIS — R569 Unspecified convulsions: Secondary | ICD-10-CM | POA: Diagnosis not present

## 2023-08-12 DIAGNOSIS — E782 Mixed hyperlipidemia: Secondary | ICD-10-CM | POA: Diagnosis not present

## 2023-08-12 DIAGNOSIS — Z743 Need for continuous supervision: Secondary | ICD-10-CM | POA: Diagnosis not present

## 2023-08-12 DIAGNOSIS — E876 Hypokalemia: Secondary | ICD-10-CM | POA: Diagnosis not present

## 2023-08-12 MED ORDER — LOPERAMIDE HCL 2 MG PO CAPS: 2.0000 mg | ORAL_CAPSULE | ORAL | Status: DC | PRN

## 2023-08-12 NOTE — Discharge Summary (Signed)
PATIENT DETAILS Name: Shelby Jordan Age: 73 y.o. Sex: female Date of Birth: [birth year] MRN: [id number]. Admitting Physician: Maretta Bees, MD NWG:[id number], Wyatt Mage, FNP  Admit Date: 08/08/2023 Discharge date: 08/12/2023  Recommendations for Outpatient Follow-up:  Follow up with PCP in 1-2 weeks Please obtain CMP/CBC in one week  Admitted From:  Home  Disposition: Skilled nursing facility   Discharge Condition: good  CODE STATUS:   Code Status: Full Code   Diet recommendation:  Diet Order             Diet - low sodium heart healthy           Diet Heart Room service appropriate? Yes; Fluid consistency: Thin  Diet effective now                    Brief Summary: Patient is a 73 y.o.  female with a history of HTN, HLD, bipolar disorder, seizure disorder, CKD stage IIIa who presented with possible breakthrough seizures-admitted for further evaluation including LTM EEG.   Significant events: 1/19>> admit to [facility] 1/20>> LTM EEG removed-discharge plan-however very unsteady on feet-discharge held 1/21>> evaluated by PT-very unsteady-falling backwards-MRI brain ordered-negative for acute CVA.   Significant studies: 1/18>> CT head: No acute endocrine abnormality 1/18>> CT C-spine: No fracture 1/19>> CT head: No acute intracranial abnormality. 1/19>> CXR: No pneumonia-suggestive of acute bronchitis. 1/21>> MRI brain: No acute CVA.   Significant microbiology data: 1/19>> respiratory virus panel: Negative 1/19>> COVID PCR: Negative   Procedures: None   Consults: Neurology  Brief Hospital Course: Suspected breakthrough seizures Seizure-free for the past several days LTM EEG negative for seizures Continue Depakote   Unsteady gait Suspected to be related to debility-patient was essentially bedbound while getting LTM EEG MRI brain negative for CVA Orthostatic vital signs negative Continue to mobilize with PT/OT-SNF planned on discharge.      Leukocytosis Leukocytosis resolved-had some low-grade intermittent fever-but none for 24 hours-also had some intermittent diarrhea that has also resolved. Continue to watch/monitor off antibiotics.   Anion gap metabolic acidosis Probably secondary to lactic acidosis-in the setting of seizures.   CKD stage IIIa At baseline   HTN BP stable Continue amlodipine/metoprolol   COPD Not in exacerbation Bronchodilators   Bipolar disorder Appears stable Depakote/Zyprexa Keep next appointment with psychiatry   Class 1 Obesity: Estimated body mass index is 35.57 kg/m as calculated from the following:   Height as of this encounter: 5\' 4"  (1.626 m).   Weight as of this encounter: 94 kg.    Discharge Diagnoses:  Principal Problem:   Seizure (HCC) Active Problems:   Leukocytosis   Metabolic acidosis with increased anion gap and accumulation of organic acids   Hypokalemia   Essential hypertension   Chronic kidney disease, stage 3a (HCC)   Bipolar 1 disorder (HCC)   Obesity (BMI 35.0-39.9 without comorbidity)   Seizure disorder [facility])   Discharge Instructions:  Activity:  As tolerated with Full fall precautions use walker/cane & assistance as needed   Discharge Instructions     Ambulatory referral to Neurology   Complete by: As directed    An appointment is requested in approximately: 4 weeks - 8 weeks   Diet - low sodium heart healthy   Complete by: As directed    Discharge instructions   Complete by: As directed    Follow with Primary MD  Mort Sawyers, FNP in 1-2 weeks  Follow with neurology-the office will call you with a follow-up appointment.  Please get  a complete blood count and chemistry panel checked by your Primary MD at your next visit, and again as instructed by your Primary MD.  Get Medicines reviewed and adjusted: Please take all your medications with you for your next visit with your Primary MD  Laboratory/radiological data: Please request your  Primary MD to go over all hospital tests and procedure/radiological results at the follow up, please ask your Primary MD to get all Hospital records sent to his/her office.  In some cases, they will be blood work, cultures and biopsy results pending at the time of your discharge. Please request that your primary care M.D. follows up on these results.  Also Note the following: If you experience worsening of your admission symptoms, develop shortness of breath, life threatening emergency, suicidal or homicidal thoughts you must seek medical attention immediately by calling 911 or calling your MD immediately  if symptoms less severe.  You must read complete instructions/literature along with all the possible adverse reactions/side effects for all the Medicines you take and that have been prescribed to you. Take any new Medicines after you have completely understood and accpet all the possible adverse reactions/side effects.   Do not drive when taking Pain medications or sleeping medications (Benzodaizepines)  Do not take more than prescribed Pain, Sleep and Anxiety Medications. It is not advisable to combine anxiety,sleep and pain medications without talking with your primary care practitioner  Special Instructions: If you have smoked or chewed Tobacco  in the last 2 yrs please stop smoking, stop any regular Alcohol  and or any Recreational drug use.  Wear Seat belts while driving.  Please note: You were cared for by a hospitalist during your hospital stay. Once you are discharged, your primary care physician will handle any further medical issues. Please note that NO REFILLS for any discharge medications will be authorized once you are discharged, as it is imperative that you return to your primary care physician (or establish a relationship with a primary care physician if you do not have one) for your post hospital discharge needs so that they can reassess your need for medications and monitor your  lab values.     Seizure precautions: Per [facility] statutes, patients with seizures are not allowed to drive until they have been seizure-free for six months and cleared by a physician    Use caution when using heavy equipment or power tools. Avoid working on ladders or at heights. Take showers instead of baths. Ensure the water temperature is not too high on the home water heater. Do not go swimming alone. Do not lock yourself in a room alone (i.e. bathroom). When caring for infants or small children, sit down when holding, feeding, or changing them to minimize risk of injury to the child in the event you have a seizure. Maintain good sleep hygiene. Avoid alcohol.    If patient has another seizure, call 911 and bring them back to the ED if: A.  The seizure lasts longer than 5 minutes.      B.  The patient doesn't wake shortly after the seizure or has new problems such as difficulty seeing, speaking or moving following the seizure C.  The patient was injured during the seizure D.  The patient has a temperature over 102 F (39C) E.  The patient vomited during the seizure and now is having trouble breathing    During the Seizure   - First, ensure adequate ventilation and place patients on the floor on  their left side  Loosen clothing around the neck and ensure the airway is patent. If the patient is clenching the teeth, do not force the mouth open with any object as this can cause severe damage - Remove all items from the surrounding that can be hazardous. The patient may be oblivious to what's happening and may not even know what he or she is doing. If the patient is confused and wandering, either gently guide him/her away and block access to outside areas - Reassure the individual and be comforting - Call 911. In most cases, the seizure ends before EMS arrives. However, there are cases when seizures may last over 3 to 5 minutes. Or the individual may have developed breathing difficulties  or severe injuries. If a pregnant patient or a person with diabetes develops a seizure, it is prudent to call an ambulance. - Finally, if the patient does not regain full consciousness, then call EMS. Most patients will remain confused for about 45 to 90 minutes after a seizure, so you must use judgment in calling for help. - Avoid restraints but make sure the patient is in a bed with padded side rails - Place the individual in a lateral position with the neck slightly flexed; this will help the saliva drain from the mouth and prevent the tongue from falling backward - Remove all nearby furniture and other hazards from the area - Provide verbal assurance as the individual is regaining consciousness - Provide the patient with privacy if possible - Call for help and start treatment as ordered by the caregiver    After the Seizure (Postictal Stage)   After a seizure, most patients experience confusion, fatigue, muscle pain and/or a headache. Thus, one should permit the individual to sleep. For the next few days, reassurance is essential. Being calm and helping reorient the person is also of importance.   Most seizures are painless and end spontaneously. Seizures are not harmful to others but can lead to complications such as stress on the lungs, brain and the heart. Individuals with prior lung problems may develop labored breathing and respiratory distress.    Increase activity slowly   Complete by: As directed    Increase activity slowly   Complete by: As directed       Allergies as of 08/12/2023       Reactions   Tomato (diagnostic) Other (See Comments)   Tongue break out.    Citrus Rash        Medication List     TAKE these medications    amLODipine 5 MG tablet Commonly known as: NORVASC Take 1 tablet (5 mg total) by mouth daily.   Blood Pressure Cuff Misc 1 each by Does not apply route daily.   divalproex 250 MG DR tablet Commonly known as: Depakote Take 2 tablets (500 mg  total) by mouth 2 (two) times daily. What changed: medication strength   Gemtesa 75 MG Tabs Generic drug: Vibegron Take 1 tablet (75 mg total) by mouth daily.   loperamide 2 MG capsule Commonly known as: IMODIUM Take 1 capsule (2 mg total) by mouth as needed for diarrhea or loose stools.   magnesium gluconate 500 MG tablet Commonly known as: MAGONATE Take 500 mg by mouth at bedtime.   metoprolol succinate 50 MG 24 hr tablet Commonly known as: TOPROL-XL TAKE 1 TABLET BY MOUTH EVERY DAY   OLANZapine 10 MG tablet Commonly known as: ZyPREXA Take 1 tablet (10 mg total) by mouth at bedtime.  potassium chloride 10 MEQ tablet Commonly known as: KLOR-CON M Take 1 tablet (10 mEq total) by mouth daily.   traZODone 50 MG tablet Commonly known as: DESYREL Take 1 tablet (50 mg total) by mouth at bedtime as needed for sleep.   Ventolin HFA 108 (90 Base) MCG/ACT inhaler Generic drug: albuterol Inhale 2 puffs into the lungs every 4 (four) hours as needed for shortness of breath.   Wixela Inhub 100-50 MCG/ACT Aepb Generic drug: fluticasone-salmeterol INHALE 1 PUFF INTO THE LUNGS TWICE A DAY What changed: See the new instructions.        Contact information for follow-up providers     Mort Sawyers, FNP. Schedule an appointment as soon as possible for a visit in 1 week(s).   Specialty: Family Medicine Contact information: [address] [place] [address] [phone]         [facility] Follow up.   Specialty: Neurology Why: Office will call with date/time, If you dont hear from them,please give them a call Contact information: [address] [place] [address] [phone]             Contact information for after-discharge care     Destination     [facility] .   Service: Skilled Nursing Contact information: [address] [place]  [address] [phone]                    Allergies  Allergen Reactions   Tomato (Diagnostic) Other (See Comments)    Tongue break out.    Citrus Rash     Other Procedures/Studies: DG Chest Port 1V same Day Result Date: 08/11/2023 CLINICAL DATA:  Shortness of breath. EXAM: PORTABLE CHEST 1 VIEW COMPARISON:  08/08/2023 FINDINGS: Lungs are adequately inflated without focal airspace consolidation or effusion. Cardiomediastinal silhouette and remainder of the exam is unchanged. IMPRESSION: No active disease. Electronically Signed   By: Elberta Fortis M.D.   On: 08/11/2023 14:10   MR BRAIN WO CONTRAST Result Date: 08/10/2023 CLINICAL DATA:  Seizure, new onset EXAM: MRI HEAD WITHOUT CONTRAST TECHNIQUE: Multiplanar, multiecho pulse sequences of the brain and surrounding structures were obtained without intravenous contrast. COMPARISON:  Head CT 08/08/2023 FINDINGS: Brain: Diffusion imaging does not show any acute or subacute infarction or other cause of restricted diffusion. No focal abnormality affects the brainstem. 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 Cerebral hemispheres show an old infarction in the left basal ganglia and moderate to pronounced chronic small-vessel ischemic changes of the deep and subcortical white matter. No cortical or large vessel territory infarction. No mass lesion, hemorrhage, hydrocephalus or extra-axial collection. Mesial temporal lobes are symmetric. Vascular: Major vessels at the base of the brain show flow. Skull and upper cervical spine: Negative Sinuses/Orbits: Clear/normal Other: None IMPRESSION: No acute or reversible finding. 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 Moderate to pronounced chronic small-vessel ischemic changes of the deep and subcortical white matter of both hemispheres. Electronically Signed   By: Paulina Fusi M.D.   On: 08/10/2023 15:13   Overnight EEG with video Result  Date: 08/09/2023 Charlsie Quest, MD     08/09/2023 10:04 AM Patient Name: Shelby Jordan MRN: [id number] Epilepsy Attending: Charlsie Quest Referring Physician/Provider: Erick Blinks, MD Duration: 08/08/2023 8841 to 08/09/2023 6606 Patient history: 73 y.o. female with hx of depression, Bipolar 1, Hep C, HTN, who presents with multiple  episodes concerning for seizures after running out of Depakote a month ago which she uses for mood. EEG to evaluate for seizure Level of alertness: Awake, asleep AEDs during EEG study: VPA Technical aspects: This EEG study was done with scalp electrodes positioned according to the 10-20 International system of electrode placement. Electrical activity was reviewed with band pass filter of 1-70Hz , sensitivity of 7 uV/mm, display speed of 76mm/sec with a 60Hz  notched filter applied as appropriate. EEG data were recorded continuously and digitally stored.  Video monitoring was available and reviewed as appropriate. Description: The posterior dominant rhythm consists of 8-9 Hz activity of moderate voltage (25-35 uV) seen predominantly in posterior head regions, symmetric and reactive to eye opening and eye closing. Sleep was characterized by vertex waves, sleep spindles (12 to 14 Hz), maximal frontocentral region.  Hyperventilation and photic stimulation were not performed.   IMPRESSION: This study is within normal limits. No seizures or epileptiform discharges were seen throughout the recording. A normal interictal EEG does not exclude the diagnosis of epilepsy. Charlsie Quest   DG CHEST PORT 1 VIEW Result Date: 08/08/2023 CLINICAL DATA:  73 year old female with seizure-like activity. EXAM: PORTABLE CHEST 1 VIEW COMPARISON:  Chest x-ray 04/15/2023. FINDINGS: Lung volumes are normal. No consolidative airspace disease. Diffuse interstitial prominence and widespread peribronchial cuffing. No pleural effusions. No pneumothorax. No evidence of pulmonary edema. Heart size is normal.  The patient is rotated to the left on today's exam, resulting in distortion of the mediastinal contours and reduced diagnostic sensitivity and specificity for mediastinal pathology. IMPRESSION: 1. The appearance of the chest suggests an acute bronchitis. Electronically Signed   By: Trudie Reed M.D.   On: 08/08/2023 08:25   CT Head Wo Contrast Result Date: 08/08/2023 CLINICAL DATA:  New onset seizure. EXAM: CT HEAD WITHOUT CONTRAST TECHNIQUE: Contiguous axial images were obtained from the base of the skull through the vertex without intravenous contrast. RADIATION DOSE REDUCTION: This exam was performed according to the departmental dose-optimization program which includes automated exposure control, adjustment of the mA and/or kV according to patient size and/or use of iterative reconstruction technique. COMPARISON:  CT head 08/07/2023 FINDINGS: Brain: No intracranial hemorrhage, mass effect, or evidence of acute infarct. No hydrocephalus. No extra-axial fluid collection. Age-commensurate cerebral atrophy and chronic small vessel ischemic disease. 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 Chronic left cerebellar infarct. Vascular: No hyperdense vessel. Intracranial arterial calcification. Skull: No fracture or focal lesion.  Left forehead hematoma. Sinuses/Orbits: No acute finding. Other: None. IMPRESSION: 1. No acute intracranial abnormality. 2. Left forehead hematoma. No skull fracture. Electronically Signed   By: Minerva Fester M.D.   On: 08/08/2023 03:56   CT HEAD WO CONTRAST ( ) Result Date: 08/07/2023 CLINICAL DATA:  Mechanical fall.  Laceration to middle of forehead. EXAM: CT HEAD WITHOUT CONTRAST CT CERVICAL SPINE WITHOUT CONTRAST TECHNIQUE: Multidetector CT imaging of the head and cervical spine was performed following the standard protocol without intravenous contrast. Multiplanar CT image reconstructions of the cervical spine were also generated.  RADIATION DOSE REDUCTION: This exam was performed according to the departmental dose-optimization program which includes automated exposure control, adjustment of the mA and/or kV according to patient size and/or use of iterative reconstruction technique. COMPARISON:  None Available. FINDINGS: CT HEAD FINDINGS Brain: No intracranial hemorrhage, mass effect, or evidence of acute infarct. No hydrocephalus. No extra-axial fluid collection. Mild cerebral atrophy and chronic small vessel ischemic disease. Chronic infarct or prominent perivascular space in the left basal  ganglia. Vascular: No hyperdense vessel. Intracranial arterial calcification. Skull: No fracture or focal lesion.  Left forehead scalp hematoma. Sinuses/Orbits: No acute finding. Other: None. CT CERVICAL SPINE FINDINGS Alignment: No evidence of traumatic malalignment. Skull base and vertebrae: No acute fracture. No primary bone lesion or focal pathologic process. Soft tissues and spinal canal: No prevertebral fluid or swelling. No visible canal hematoma. Disc levels: Multilevel spondylosis, disc space height loss, and degenerative endplate changes greatest at C5-C6 where it is moderate. No severe spinal canal narrowing. Upper chest: No acute abnormality. Other: None. IMPRESSION: 1. No acute intracranial abnormality. 2. No acute fracture in the cervical spine. Electronically Signed   By: Minerva Fester M.D.   On: 08/07/2023 22:22   CT Cervical Spine Wo Contrast Result Date: 08/07/2023 CLINICAL DATA:  Mechanical fall.  Laceration to middle of forehead. EXAM: CT HEAD WITHOUT CONTRAST CT CERVICAL SPINE WITHOUT CONTRAST TECHNIQUE: Multidetector CT imaging of the head and cervical spine was performed following the standard protocol without intravenous contrast. Multiplanar CT image reconstructions of the cervical spine were also generated. RADIATION DOSE REDUCTION: This exam was performed according to the departmental dose-optimization program which  includes automated exposure control, adjustment of the mA and/or kV according to patient size and/or use of iterative reconstruction technique. COMPARISON:  None Available. FINDINGS: CT HEAD FINDINGS Brain: No intracranial hemorrhage, mass effect, or evidence of acute infarct. No hydrocephalus. No extra-axial fluid collection. Mild cerebral atrophy and chronic small vessel ischemic disease. 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 Vascular: No hyperdense vessel. Intracranial arterial calcification. Skull: No fracture or focal lesion.  Left forehead scalp hematoma. Sinuses/Orbits: No acute finding. Other: None. CT CERVICAL SPINE FINDINGS Alignment: No evidence of traumatic malalignment. Skull base and vertebrae: No acute fracture. No primary bone lesion or focal pathologic process. Soft tissues and spinal canal: No prevertebral fluid or swelling. No visible canal hematoma. Disc levels: Multilevel spondylosis, disc space height loss, and degenerative endplate changes greatest at C5-C6 where it is moderate. No severe spinal canal narrowing. Upper chest: No acute abnormality. Other: None. IMPRESSION: 1. No acute intracranial abnormality. 2. No acute fracture in the cervical spine. Electronically Signed   By: Minerva Fester M.D.   On: 08/07/2023 22:22     TODAY-DAY OF DISCHARGE:  Subjective:   Elia Hiltner today has no headache,no chest abdominal pain,no new weakness tingling or numbness, feels much better wants to go home today.  Objective:   Blood pressure (!) 118/55, pulse 74, temperature 98.3 F (36.8 C), temperature source Axillary, resp. rate 20, height 5\' 4"  (1.626 m), weight 94 kg, SpO2 94%.  Intake/Output Summary (Last 24 hours) at 08/12/2023 0938 Last data filed at 08/11/2023 2000 Gross per 24 hour  Intake 480 ml  Output 1 ml  Net 479 ml   Filed Weights   08/08/23 0147  Weight: 94 kg    Exam: Awake Alert, Oriented *3, No new F.N deficits, Normal  affect Parker.AT,PERRAL Supple Neck,No JVD, No cervical lymphadenopathy appriciated.  Symmetrical Chest wall movement, Good air movement bilaterally, CTAB RRR,No Gallops,Rubs or new Murmurs, No Parasternal Heave +ve B.Sounds, Abd Soft, Non tender, No organomegaly appriciated, No rebound -guarding or rigidity. No Cyanosis, Clubbing or edema, No new Rash or bruise   PERTINENT RADIOLOGIC STUDIES: DG Chest Port 1V same Day Result Date: 08/11/2023 CLINICAL DATA:  Shortness of breath. EXAM: PORTABLE CHEST 1 VIEW COMPARISON:  08/08/2023 FINDINGS: Lungs are adequately inflated without focal airspace consolidation or effusion. Cardiomediastinal silhouette and  remainder of the exam is unchanged. IMPRESSION: No active disease. Electronically Signed   By: Elberta Fortis M.D.   On: 08/11/2023 14:10   MR BRAIN WO CONTRAST Result Date: 08/10/2023 CLINICAL DATA:  Seizure, new onset EXAM: MRI HEAD WITHOUT CONTRAST TECHNIQUE: Multiplanar, multiecho pulse sequences of the brain and surrounding structures were obtained without intravenous contrast. COMPARISON:  Head CT 08/08/2023 FINDINGS: Brain: Diffusion imaging does not show any acute or subacute infarction or other cause of restricted diffusion. No focal abnormality affects the brainstem. 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 Cerebral hemispheres show an old infarction in the left basal ganglia and moderate to pronounced chronic small-vessel ischemic changes of the deep and subcortical white matter. No cortical or large vessel territory infarction. No mass lesion, hemorrhage, hydrocephalus or extra-axial collection. Mesial temporal lobes are symmetric. Vascular: Major vessels at the base of the brain show flow. Skull and upper cervical spine: Negative Sinuses/Orbits: Clear/normal Other: None IMPRESSION: No acute or reversible finding. 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 Moderate to pronounced  chronic small-vessel ischemic changes of the deep and subcortical white matter of both hemispheres. Electronically Signed   By: Paulina Fusi M.D.   On: 08/10/2023 15:13     PERTINENT LAB RESULTS: CBC: No results for input(s): "WBC", "HGB", "HCT", "PLT" in the last 72 hours. CMET CMP     Component Value Date/Time   NA 139 08/09/2023 0342   K 4.0 08/09/2023 0342   CL 103 08/09/2023 0342   CO2 24 08/09/2023 0342   GLUCOSE 101 (H) 08/09/2023 0342   BUN 12 08/09/2023 0342   CREATININE 1.26 (H) 08/09/2023 0342   CALCIUM 8.9 08/09/2023 0342   PROT 7.3 08/08/2023 0311   ALBUMIN 4.3 08/08/2023 0311   AST 24 08/08/2023 0311   ALT 19 08/08/2023 0311   ALKPHOS 45 08/08/2023 0311   BILITOT 0.7 08/08/2023 0311   GFR 47.84 (L) 04/15/2023 1252   GFRNONAA 45 (L) 08/09/2023 0342    GFR Estimated Creatinine Clearance: 44.9 mL/min (A) (by C-G formula based on SCr of 1.26 mg/dL (H)). No results for input(s): "LIPASE", "AMYLASE" in the last 72 hours. No results for input(s): "CKTOTAL", "CKMB", "CKMBINDEX", "TROPONINI" in the last 72 hours. Invalid input(s): "POCBNP" No results for input(s): "DDIMER" in the last 72 hours. No results for input(s): "HGBA1C" in the last 72 hours. No results for input(s): "CHOL", "HDL", "LDLCALC", "TRIG", "CHOLHDL", "LDLDIRECT" in the last 72 hours. No results for input(s): "TSH", "T4TOTAL", "T3FREE", "THYROIDAB" in the last 72 hours.  Invalid input(s): "FREET3" No results for input(s): "VITAMINB12", "FOLATE", "FERRITIN", "TIBC", "IRON", "RETICCTPCT" in the last 72 hours. Coags: No results for input(s): "INR" in the last 72 hours.  Invalid input(s): "PT" Microbiology: Recent Results (from the past 240 hours)  Respiratory (~20 pathogens) panel by PCR     Status: None   Collection Time: 08/08/23  8:51 AM   Specimen: Nasopharyngeal Swab; Respiratory  Result Value Ref Range Status   Adenovirus NOT DETECTED NOT DETECTED Final   Coronavirus 229E NOT DETECTED NOT  DETECTED Final    Comment: (NOTE) The Coronavirus on the Respiratory Panel, DOES NOT test for the novel  Coronavirus (2019 nCoV)    Coronavirus HKU1 NOT DETECTED NOT DETECTED Final   Coronavirus NL63 NOT DETECTED NOT DETECTED Final   Coronavirus OC43 NOT DETECTED NOT DETECTED Final   Metapneumovirus NOT DETECTED NOT DETECTED Final   Rhinovirus / Enterovirus NOT DETECTED NOT  DETECTED Final   Influenza A NOT DETECTED NOT DETECTED Final   Influenza B NOT DETECTED NOT DETECTED Final   Parainfluenza Virus 1 NOT DETECTED NOT DETECTED Final   Parainfluenza Virus 2 NOT DETECTED NOT DETECTED Final   Parainfluenza Virus 3 NOT DETECTED NOT DETECTED Final   Parainfluenza Virus 4 NOT DETECTED NOT DETECTED Final   Respiratory Syncytial Virus NOT DETECTED NOT DETECTED Final   Bordetella pertussis NOT DETECTED NOT DETECTED Final   Bordetella Parapertussis NOT DETECTED NOT DETECTED Final   Chlamydophila pneumoniae NOT DETECTED NOT DETECTED Final   Mycoplasma pneumoniae NOT DETECTED NOT DETECTED Final    Comment: Performed at [facility], [address], [place], [place] [address]  SARS Coronavirus 2 by RT PCR (hospital order, performed in [facility] hospital lab) *cepheid single result test* Anterior Nasal Swab     Status: None   Collection Time: 08/08/23  8:51 AM   Specimen: Anterior Nasal Swab  Result Value Ref Range Status   SARS Coronavirus 2 by RT PCR NEGATIVE NEGATIVE Final    Comment: Performed at [facility], [address] [place], [place] [address]    FURTHER DISCHARGE INSTRUCTIONS:  Get Medicines reviewed and adjusted: Please take all your medications with you for your next visit with your Primary MD  Laboratory/radiological data: Please request your Primary MD to go over all hospital tests and procedure/radiological results at the follow up, please ask your Primary MD to get all Hospital records sent to his/her office.  In some cases, they will be blood work, cultures  and biopsy results pending at the time of your discharge. Please request that your primary care M.D. goes through all the records of your hospital data and follows up on these results.  Also Note the following: If you experience worsening of your admission symptoms, develop shortness of breath, life threatening emergency, suicidal or homicidal thoughts you must seek medical attention immediately by calling 911 or calling your MD immediately  if symptoms less severe.  You must read complete instructions/literature along with all the possible adverse reactions/side effects for all the Medicines you take and that have been prescribed to you. Take any new Medicines after you have completely understood and accpet all the possible adverse reactions/side effects.   Do not drive when taking Pain medications or sleeping medications (Benzodaizepines)  Do not take more than prescribed Pain, Sleep and Anxiety Medications. It is not advisable to combine anxiety,sleep and pain medications without talking with your primary care practitioner  Special Instructions: If you have smoked or chewed Tobacco  in the last 2 yrs please stop smoking, stop any regular Alcohol  and or any Recreational drug use.  Wear Seat belts while driving.  Please note: You were cared for by a hospitalist during your hospital stay. Once you are discharged, your primary care physician will handle any further medical issues. Please note that NO REFILLS for any discharge medications will be authorized once you are discharged, as it is imperative that you return to your primary care physician (or establish a relationship with a primary care physician if you do not have one) for your post hospital discharge needs so that they can reassess your need for medications and monitor your lab values.  Total Time spent coordinating discharge including counseling, education and face to face time equals greater than 30 minutes.  SignedJeoffrey Massed 08/12/2023 9:38 AM

## 2023-08-12 NOTE — TOC Progression Note (Signed)
Transition of Care [facility]) - Progression Note    Patient Details  Name: Shelby Jordan MRN: [id number] Date of Birth: [birth year]  [facility]) CM/SW Contact  Zeppelin Beckstrand Reeves Forth, Student-Social Work Phone Number: 08/12/2023, 10:03 AM  Clinical Narrative:    MSW Intern spoke with pt daughter about discharge to [facility]. Pt daughter agreed with [facility] transport to facility.    Expected Discharge Plan: Skilled Nursing Facility Barriers to Discharge: Continued Medical Work up  Expected Discharge Plan and Services In-house Referral: Clinical Social Work   Post Acute Care Choice: Skilled Nursing Facility Living arrangements for the past 2 months: Single Family Home Expected Discharge Date: 08/12/23                                     Social Determinants of Health (SDOH) Interventions SDOH Screenings   Food Insecurity: Patient Unable To Answer (08/09/2023)  Housing: Patient Unable To Answer (08/09/2023)  Transportation Needs: Patient Unable To Answer (08/09/2023)  Utilities: Patient Unable To Answer (08/09/2023)  Alcohol Screen: Low Risk  (07/20/2022)  Depression (PHQ2-9): Low Risk  (04/15/2023)  Financial Resource Strain: Low Risk  (05/13/2022)  Physical Activity: Inactive (05/13/2022)  Social Connections: Patient Unable To Answer (08/09/2023)  Stress: No Stress Concern Present (05/13/2022)  Tobacco Use: Medium Risk (08/08/2023)    Readmission Risk Interventions     No data to display

## 2023-08-12 NOTE — Progress Notes (Signed)
[facility] was called several times to give report. Nurse was not able to take report, contact number was given for her to call back for report, CM made aware.

## 2023-08-12 NOTE — TOC Transition Note (Signed)
Transition of Care [facility]) - Discharge Note   Patient Details  Name: Shelby Jordan MRN: [id number] Date of Birth: 1950-11-28  Transition of Care [facility]) CM/SW Contact:  Mearl Latin, LCSW Phone Number: 08/12/2023, 2:10 PM   Clinical Narrative:    Patient will DC to: [facility] Anticipated DC date: 08/12/23 Family notified: Daughter, Youth worker by: Sharin Mons   Per MD patient ready for DC to [facility]. RN to call report prior to discharge [phone]. room 51A). RN, patient, patient's family, and facility notified of DC. Discharge Summary and FL2 sent to facility. DC packet on chart. Ambulance transport requested for patient.   CSW will sign off for now as social work intervention is no longer needed. Please consult [place] again if new needs arise.     Final next level of care: Skilled Nursing Facility Barriers to Discharge: Barriers Resolved   Patient Goals and CMS Choice Patient states their goals for this hospitalization and ongoing recovery are:: Rehab CMS Medicare.gov Compare Post Acute Care list provided to:: Patient Choice offered to / list presented to : Patient, Adult Children [place] ownership interest in [facility].provided to:: Patient    Discharge Placement   Existing PASRR number confirmed : 08/12/23          Patient chooses bed at:  [facility]) Patient to be transferred to facility by: [facility] Name of family member notified: Flint Melter Patient and family notified of of transfer: 08/12/23  Discharge Plan and Services Additional resources added to the After Visit Summary for   In-house Referral: Clinical Social Work   Post Acute Care Choice: Skilled Nursing Facility                               Social Drivers of Health (SDOH) Interventions SDOH Screenings   Food Insecurity: Patient Unable To Answer (08/09/2023)  Housing: Patient Unable To Answer (08/09/2023)  Transportation Needs: Patient Unable To Answer  (08/09/2023)  Utilities: Patient Unable To Answer (08/09/2023)  Alcohol Screen: Low Risk  (07/20/2022)  Depression (PHQ2-9): Low Risk  (04/15/2023)  Financial Resource Strain: Low Risk  (05/13/2022)  Physical Activity: Inactive (05/13/2022)  Social Connections: Patient Unable To Answer (08/09/2023)  Stress: No Stress Concern Present (05/13/2022)  Tobacco Use: Medium Risk (08/08/2023)     Readmission Risk Interventions     No data to display

## 2023-08-12 NOTE — Plan of Care (Signed)

## 2023-08-13 ENCOUNTER — Other Ambulatory Visit (HOSPITAL_COMMUNITY): Payer: Self-pay

## 2023-08-13 DIAGNOSIS — J449 Chronic obstructive pulmonary disease, unspecified: Secondary | ICD-10-CM | POA: Diagnosis not present

## 2023-08-13 DIAGNOSIS — R197 Diarrhea, unspecified: Secondary | ICD-10-CM | POA: Diagnosis not present

## 2023-08-13 DIAGNOSIS — I10 Essential (primary) hypertension: Secondary | ICD-10-CM | POA: Diagnosis not present

## 2023-08-14 ENCOUNTER — Other Ambulatory Visit: Payer: Self-pay | Admitting: Family

## 2023-08-14 DIAGNOSIS — J4540 Moderate persistent asthma, uncomplicated: Secondary | ICD-10-CM

## 2023-08-14 DIAGNOSIS — R062 Wheezing: Secondary | ICD-10-CM

## 2023-08-15 ENCOUNTER — Encounter (HOSPITAL_COMMUNITY): Payer: Self-pay

## 2023-08-15 ENCOUNTER — Inpatient Hospital Stay (HOSPITAL_COMMUNITY)
Admission: EM | Admit: 2023-08-15 | Discharge: 2023-08-20 | DRG: 871 | Disposition: A | Discharge status: Home Health | Payer: 59 | Attending: Internal Medicine | Admitting: Internal Medicine

## 2023-08-15 ENCOUNTER — Emergency Department (HOSPITAL_COMMUNITY): Payer: 59

## 2023-08-15 ENCOUNTER — Other Ambulatory Visit: Payer: Self-pay

## 2023-08-15 DIAGNOSIS — I4510 Unspecified right bundle-branch block: Secondary | ICD-10-CM | POA: Diagnosis not present

## 2023-08-15 DIAGNOSIS — Z9049 Acquired absence of other specified parts of digestive tract: Secondary | ICD-10-CM

## 2023-08-15 DIAGNOSIS — R0602 Shortness of breath: Secondary | ICD-10-CM | POA: Diagnosis not present

## 2023-08-15 DIAGNOSIS — R6889 Other general symptoms and signs: Secondary | ICD-10-CM | POA: Diagnosis not present

## 2023-08-15 DIAGNOSIS — J189 Pneumonia, unspecified organism: Secondary | ICD-10-CM | POA: Diagnosis not present

## 2023-08-15 DIAGNOSIS — I129 Hypertensive chronic kidney disease with stage 1 through stage 4 chronic kidney disease, or unspecified chronic kidney disease: Secondary | ICD-10-CM | POA: Diagnosis not present

## 2023-08-15 DIAGNOSIS — Z79899 Other long term (current) drug therapy: Secondary | ICD-10-CM | POA: Diagnosis not present

## 2023-08-15 DIAGNOSIS — Z7951 Long term (current) use of inhaled steroids: Secondary | ICD-10-CM

## 2023-08-15 DIAGNOSIS — E872 Acidosis, unspecified: Secondary | ICD-10-CM | POA: Diagnosis present

## 2023-08-15 DIAGNOSIS — Y95 Nosocomial condition: Secondary | ICD-10-CM | POA: Diagnosis not present

## 2023-08-15 DIAGNOSIS — Z87440 Personal history of urinary (tract) infections: Secondary | ICD-10-CM | POA: Diagnosis not present

## 2023-08-15 DIAGNOSIS — A419 Sepsis, unspecified organism: Secondary | ICD-10-CM | POA: Diagnosis not present

## 2023-08-15 DIAGNOSIS — J440 Chronic obstructive pulmonary disease with acute lower respiratory infection: Secondary | ICD-10-CM | POA: Diagnosis present

## 2023-08-15 DIAGNOSIS — N1831 Chronic kidney disease, stage 3a: Secondary | ICD-10-CM | POA: Diagnosis present

## 2023-08-15 DIAGNOSIS — R5381 Other malaise: Secondary | ICD-10-CM | POA: Diagnosis not present

## 2023-08-15 DIAGNOSIS — J208 Acute bronchitis due to other specified organisms: Secondary | ICD-10-CM | POA: Diagnosis not present

## 2023-08-15 DIAGNOSIS — J9601 Acute respiratory failure with hypoxia: Secondary | ICD-10-CM | POA: Diagnosis present

## 2023-08-15 DIAGNOSIS — B957 Other staphylococcus as the cause of diseases classified elsewhere: Secondary | ICD-10-CM | POA: Diagnosis present

## 2023-08-15 DIAGNOSIS — Z87891 Personal history of nicotine dependence: Secondary | ICD-10-CM | POA: Diagnosis not present

## 2023-08-15 DIAGNOSIS — E66811 Obesity, class 1: Secondary | ICD-10-CM | POA: Diagnosis present

## 2023-08-15 DIAGNOSIS — J4540 Moderate persistent asthma, uncomplicated: Secondary | ICD-10-CM

## 2023-08-15 DIAGNOSIS — Z6835 Body mass index (BMI) 35.0-35.9, adult: Secondary | ICD-10-CM

## 2023-08-15 DIAGNOSIS — G40909 Epilepsy, unspecified, not intractable, without status epilepticus: Secondary | ICD-10-CM | POA: Diagnosis present

## 2023-08-15 DIAGNOSIS — Z91018 Allergy to other foods: Secondary | ICD-10-CM | POA: Diagnosis not present

## 2023-08-15 DIAGNOSIS — Z888 Allergy status to other drugs, medicaments and biological substances status: Secondary | ICD-10-CM | POA: Diagnosis not present

## 2023-08-15 DIAGNOSIS — Z8619 Personal history of other infectious and parasitic diseases: Secondary | ICD-10-CM

## 2023-08-15 DIAGNOSIS — R6520 Severe sepsis without septic shock: Secondary | ICD-10-CM | POA: Diagnosis not present

## 2023-08-15 DIAGNOSIS — F209 Schizophrenia, unspecified: Secondary | ICD-10-CM | POA: Diagnosis present

## 2023-08-15 DIAGNOSIS — R569 Unspecified convulsions: Secondary | ICD-10-CM | POA: Diagnosis not present

## 2023-08-15 DIAGNOSIS — E7800 Pure hypercholesterolemia, unspecified: Secondary | ICD-10-CM | POA: Diagnosis not present

## 2023-08-15 DIAGNOSIS — A4189 Other specified sepsis: Principal | ICD-10-CM | POA: Diagnosis present

## 2023-08-15 DIAGNOSIS — R062 Wheezing: Secondary | ICD-10-CM

## 2023-08-15 DIAGNOSIS — I499 Cardiac arrhythmia, unspecified: Secondary | ICD-10-CM | POA: Diagnosis not present

## 2023-08-15 DIAGNOSIS — F319 Bipolar disorder, unspecified: Secondary | ICD-10-CM | POA: Diagnosis present

## 2023-08-15 DIAGNOSIS — Z818 Family history of other mental and behavioral disorders: Secondary | ICD-10-CM

## 2023-08-15 DIAGNOSIS — Z743 Need for continuous supervision: Secondary | ICD-10-CM | POA: Diagnosis not present

## 2023-08-15 DIAGNOSIS — J101 Influenza due to other identified influenza virus with other respiratory manifestations: Secondary | ICD-10-CM | POA: Diagnosis not present

## 2023-08-15 DIAGNOSIS — Z89429 Acquired absence of other toe(s), unspecified side: Secondary | ICD-10-CM

## 2023-08-15 LAB — COMPREHENSIVE METABOLIC PANEL
ALT: 20 U/L (ref 0–44)
AST: 25 U/L (ref 15–41)
Albumin: 3.8 g/dL (ref 3.5–5.0)
Alkaline Phosphatase: 36 U/L — ABNORMAL LOW (ref 38–126)
Anion gap: 14 (ref 5–15)
BUN: 13 mg/dL (ref 8–23)
CO2: 23 mmol/L (ref 22–32)
Calcium: 9.3 mg/dL (ref 8.9–10.3)
Chloride: 99 mmol/L (ref 98–111)
Creatinine, Ser: 1.3 mg/dL — ABNORMAL HIGH (ref 0.44–1.00)
GFR, Estimated: 44 mL/min — ABNORMAL LOW (ref >60)
Glucose, Bld: 207 mg/dL — ABNORMAL HIGH (ref 70–99)
Potassium: 4.1 mmol/L (ref 3.5–5.1)
Sodium: 136 mmol/L (ref 135–145)
Total Bilirubin: 0.6 mg/dL (ref 0.0–1.2)
Total Protein: 7.1 g/dL (ref 6.5–8.1)

## 2023-08-15 LAB — I-STAT CHEM 8, ED
BUN: 16 mg/dL (ref 8–23)
Calcium, Ion: 1.13 mmol/L — ABNORMAL LOW (ref 1.15–1.40)
Chloride: 101 mmol/L (ref 98–111)
Creatinine, Ser: 1.3 mg/dL — ABNORMAL HIGH (ref 0.44–1.00)
Glucose, Bld: 208 mg/dL — ABNORMAL HIGH (ref 70–99)
HCT: 42 % (ref 36.0–46.0)
Hemoglobin: 14.3 g/dL (ref 12.0–15.0)
Potassium: 4 mmol/L (ref 3.5–5.1)
Sodium: 136 mmol/L (ref 135–145)
TCO2: 24 mmol/L (ref 22–32)

## 2023-08-15 LAB — APTT: aPTT: 27 s (ref 24–36)

## 2023-08-15 LAB — CBC WITH DIFFERENTIAL/PLATELET
Abs Immature Granulocytes: 0.07 10*3/uL (ref 0.00–0.07)
Basophils Absolute: 0 10*3/uL (ref 0.0–0.1)
Basophils Relative: 0 %
Eosinophils Absolute: 0 10*3/uL (ref 0.0–0.5)
Eosinophils Relative: 0 %
HCT: 40.8 % (ref 36.0–46.0)
Hemoglobin: 13.3 g/dL (ref 12.0–15.0)
Immature Granulocytes: 1 %
Lymphocytes Relative: 13 %
Lymphs Abs: 1.2 10*3/uL (ref 0.7–4.0)
MCH: 29 pg (ref 26.0–34.0)
MCHC: 32.6 g/dL (ref 30.0–36.0)
MCV: 88.9 fL (ref 80.0–100.0)
Monocytes Absolute: 0.5 10*3/uL (ref 0.1–1.0)
Monocytes Relative: 6 %
Neutro Abs: 7.2 10*3/uL (ref 1.7–7.7)
Neutrophils Relative %: 80 %
Platelets: 262 10*3/uL (ref 150–400)
RBC: 4.59 MIL/uL (ref 3.87–5.11)
RDW: 14.3 % (ref 11.5–15.5)
WBC: 9 10*3/uL (ref 4.0–10.5)
nRBC: 0 % (ref 0.0–0.2)

## 2023-08-15 LAB — TROPONIN I (HIGH SENSITIVITY): Troponin I (High Sensitivity): 5 ng/L (ref <18)

## 2023-08-15 LAB — PROTIME-INR
INR: 1.1 (ref 0.8–1.2)
Prothrombin Time: 13.9 s (ref 11.4–15.2)

## 2023-08-15 LAB — I-STAT CG4 LACTIC ACID, ED: Lactic Acid, Venous: 3.7 mmol/L (ref 0.5–1.9)

## 2023-08-15 LAB — RESP PANEL BY RT-PCR (RSV, FLU A&B, COVID)  RVPGX2
Influenza A by PCR: POSITIVE — AB
Influenza B by PCR: NEGATIVE
Resp Syncytial Virus by PCR: NEGATIVE
SARS Coronavirus 2 by RT PCR: NEGATIVE

## 2023-08-15 MED ORDER — LACTATED RINGERS IV BOLUS (SEPSIS)
1000.0000 mL | Freq: Once | INTRAVENOUS | Status: AC
  Administered 2023-08-15: 1000 mL via INTRAVENOUS

## 2023-08-15 MED ORDER — KETOROLAC TROMETHAMINE 15 MG/ML IJ SOLN
15.0000 mg | Freq: Once | INTRAMUSCULAR | Status: AC
  Administered 2023-08-15: 15 mg via INTRAVENOUS
  Filled 2023-08-15: qty 1

## 2023-08-15 MED ORDER — LACTATED RINGERS IV SOLN: INTRAVENOUS | Status: AC

## 2023-08-15 MED ORDER — LACTATED RINGERS IV BOLUS (SEPSIS)
1000.0000 mL | Freq: Once | INTRAVENOUS | Status: AC
Start: 2023-08-16 — End: 2023-08-16
  Administered 2023-08-15: 1000 mL via INTRAVENOUS

## 2023-08-15 MED ORDER — VANCOMYCIN HCL IN DEXTROSE 1-5 GM/200ML-% IV SOLN: 1000.0000 mg | Freq: Once | INTRAVENOUS | Status: DC

## 2023-08-15 MED ORDER — SODIUM CHLORIDE 0.9 % IV SOLN
2.0000 g | Freq: Once | INTRAVENOUS | Status: AC
  Administered 2023-08-15: 2 g via INTRAVENOUS
  Filled 2023-08-15: qty 12.5

## 2023-08-15 MED ORDER — VANCOMYCIN HCL 2000 MG/400ML IV SOLN
2000.0000 mg | Freq: Once | INTRAVENOUS | Status: AC
  Administered 2023-08-15: 2000 mg via INTRAVENOUS
  Filled 2023-08-15: qty 400

## 2023-08-15 NOTE — ED Notes (Signed)
Patient transferred to [facility]

## 2023-08-15 NOTE — ED Triage Notes (Addendum)
Patient BIB [facility] from home due to SOB. Patient recently admitted and d/c. Patient initially sitting 80% on room air. 4LNC applied by fire raising saturation to 88%. Patient arrived on NRB with breathing tx satting 100%. Patient states she has been coughing stuff up however has been swallowing it. Patient received 20 mg albulterol, 1 atrovent, and 125 mcg solumedrol. Patient is A&Ox3 at this time.

## 2023-08-15 NOTE — ED Provider Notes (Signed)
[place] EMERGENCY DEPARTMENT AT [facility] Provider Note   CSN: [id number] Arrival date & time: 08/15/23  2228     History {Add pertinent medical, surgical, social history, OB history to HPI:1} Chief Complaint  Patient presents with   Shortness of Breath    Shelby Jordan is a 73 y.o. female who presents via EMS with concern for altered mental status and shortness of breath.  Per family, patient lives with her daughter and granddaughter.  They noted that she has been ill, coughing with production for the last few days.  This evening was confused and disoriented, prompting EMS call.  Satting 80% on room air at time of fire arrival, sats improved to 88% with 4 L oxygen by nasal cannula.  EMS placed patient on nonrebreather with 1 mg Atrovent, 20 mg? albuterol and Solu-Medrol and route.  Oriented x 2 only to herself and place. Level 5 caveat due to acuity of presentation upon arrival.   Of note patient was recently admitted to the hospital with discharge on 08/12/2023 with breakthrough seizures but reassuring long-term monitoring EEG. On Depakote. HX hypertension, schizophrenia, hepatitis C, bipolar, seizures.  Patient is not anticoagulated.  HPI     Home Medications Prior to Admission medications   Medication Sig Start Date End Date Taking? Authorizing Provider  albuterol (VENTOLIN HFA) 108 (90 Base) MCG/ACT inhaler Inhale 2 puffs into the lungs every 4 (four) hours as needed for shortness of breath. 08/09/23   Ghimire, Werner Lean, MD  amLODipine (NORVASC) 5 MG tablet Take 1 tablet (5 mg total) by mouth daily. 08/07/23 08/06/24  Delton Prairie, MD  Blood Pressure Monitoring (BLOOD PRESSURE CUFF) MISC 1 each by Does not apply route daily. 12/10/22   Mort Sawyers, FNP  divalproex (DEPAKOTE) 250 MG DR tablet Take 2 tablets (500 mg total) by mouth 2 (two) times daily. 08/09/23   Ghimire, Werner Lean, MD  loperamide (IMODIUM) 2 MG capsule Take 1 capsule (2 mg total) by mouth as needed  for diarrhea or loose stools. 08/12/23   Ghimire, Werner Lean, MD  magnesium gluconate (MAGONATE) 500 MG tablet Take 500 mg by mouth at bedtime.    [provider]  metoprolol succinate (TOPROL-XL) 50 MG 24 hr tablet TAKE 1 TABLET BY MOUTH EVERY DAY 03/08/23   Mort Sawyers, FNP  OLANZapine (ZYPREXA) 10 MG tablet Take 1 tablet (10 mg total) by mouth at bedtime. 08/09/23 08/08/24  Ghimire, Werner Lean, MD  potassium chloride (KLOR-CON M) 10 MEQ tablet Take 1 tablet (10 mEq total) by mouth daily. 08/09/23   Ghimire, Werner Lean, MD  traZODone (DESYREL) 50 MG tablet Take 1 tablet (50 mg total) by mouth at bedtime as needed for sleep. 08/09/23   Ghimire, Werner Lean, MD  Vibegron (GEMTESA) 75 MG TABS Take 1 tablet (75 mg total) by mouth daily. 08/09/23   Ghimire, Werner Lean, MD  WIXELA INHUB 100-50 MCG/ACT AEPB INHALE 1 PUFF INTO THE LUNGS TWICE A DAY Patient taking differently: Inhale 1 puff into the lungs daily as needed (for shortness of breath). 03/15/23   Mort Sawyers, FNP      Allergies    Tomato (diagnostic) and Citrus    Review of Systems   Review of Systems  Unable to perform ROS: Mental status change    Physical Exam Updated Vital Signs BP (!) 150/68 (BP Location: Right Arm)   Pulse (!) 124   Temp (!) 101.2 F (38.4 C) (Oral)   Resp (!) 27   Ht 5'  4" (1.626 m)   Wt 94 kg   SpO2 100%   BMI 35.57 kg/m  Physical Exam Vitals and nursing note reviewed.  Constitutional:      Appearance: She is ill-appearing. She is not toxic-appearing.  HENT:     Head: Normocephalic and atraumatic.     Mouth/Throat:     Mouth: Mucous membranes are moist.     Pharynx: Oropharynx is clear. Uvula midline. No oropharyngeal exudate or posterior oropharyngeal erythema.  Eyes:     General: Lids are normal. Vision grossly intact.        Right eye: No discharge.        Left eye: No discharge.     Conjunctiva/sclera: Conjunctivae normal.     Pupils: Pupils are equal, round, and reactive to light.   Neck:     Trachea: Trachea and phonation normal.  Cardiovascular:     Rate and Rhythm: Regular rhythm. Tachycardia present.     Pulses: Normal pulses.  Pulmonary:     Effort: Tachypnea, accessory muscle usage and respiratory distress present.     Breath sounds: Examination of the right-middle field reveals rales. Examination of the right-lower field reveals rales. Rales present. No wheezing.  Chest:     Chest wall: No mass, tenderness or edema.  Abdominal:     General: Bowel sounds are normal. There is no distension.     Palpations: Abdomen is soft.     Tenderness: There is no abdominal tenderness.  Musculoskeletal:        General: No deformity.     Cervical back: Neck supple.     Right lower leg: No edema.     Left lower leg: No edema.  Lymphadenopathy:     Cervical: No cervical adenopathy.  Skin:    General: Skin is warm and dry.     Capillary Refill: Capillary refill takes less than 2 seconds.  Neurological:     General: No focal deficit present.     Mental Status: She is alert.     GCS: GCS eye subscore is 4. GCS verbal subscore is 4. GCS motor subscore is 5.  Psychiatric:        Mood and Affect: Mood normal.     ED Results / Procedures / Treatments   Labs (all labs ordered are listed, but only abnormal results are displayed) Labs Reviewed  RESP PANEL BY RT-PCR (RSV, FLU A&B, COVID)  RVPGX2  CULTURE, BLOOD (ROUTINE X 2)  CULTURE, BLOOD (ROUTINE X 2)  RESP PANEL BY RT-PCR (RSV, FLU A&B, COVID)  RVPGX2  COMPREHENSIVE METABOLIC PANEL  CBC WITH DIFFERENTIAL/PLATELET  PROTIME-INR  APTT  URINALYSIS, W/ REFLEX TO CULTURE (INFECTION SUSPECTED)  I-STAT CG4 LACTIC ACID, ED  I-STAT CHEM 8, ED  TROPONIN I (HIGH SENSITIVITY)    EKG None  Radiology No results found.  Procedures .Critical Care  Performed by: Paris Lore, PA-C Authorized by: Paris Lore, PA-C   Critical care provider statement:    Critical care time (minutes):  45   Critical  care was time spent personally by me on the following activities:  Development of treatment plan with patient or surrogate, discussions with consultants, evaluation of patient's response to treatment, examination of patient, obtaining history from patient or surrogate, ordering and performing treatments and interventions, ordering and review of laboratory studies, ordering and review of radiographic studies, pulse oximetry and re-evaluation of patient's condition   {Document cardiac monitor, telemetry assessment procedure when appropriate:1}  Medications Ordered in ED Medications  lactated ringers infusion (has no administration in time range)  lactated ringers bolus 1,000 mL (has no administration in time range)    And  lactated ringers bolus 1,000 mL (has no administration in time range)    And  lactated ringers bolus 1,000 mL (has no administration in time range)  ceFEPIme (MAXIPIME) 2 g in sodium chloride 0.9 % 100 mL IVPB (has no administration in time range)  vancomycin (VANCOREADY) IVPB 2000 mg/400 mL (has no administration in time range)    ED Course/ Medical Decision Making/ A&P   {   Click here for ABCD2, HEART and other calculatorsREFRESH Note before signing :1}                              Medical Decision Making 73 year old female who presents with respiratory distress.  Septic upon presentation, febrile tachycardic and on nonrebreather with nebulizing treatment going.  Tachycardic to the 120s, has received significant dosage of albuterol with EMS prior to arrival.  Pulmonary exam with crackles in the right base and midlung, question possible left basilar crackles as well.  Abdomen soft nondistended nontender, no lower extremity edema.  Code sepsis activated, HCAP coverage  Amount and/or Complexity of Data Reviewed Labs: ordered. Radiology: ordered.  Risk Prescription drug management.   ***  {Document critical care time when appropriate:1} {Document review of labs  and clinical decision tools ie heart score, Chads2Vasc2 etc:1}  {Document your independent review of radiology images, and any outside records:1} {Document your discussion with family members, caretakers, and with consultants:1} {Document social determinants of health affecting pt's care:1} {Document your decision making why or why not admission, treatments were needed:1} Final Clinical Impression(s) / ED Diagnoses Final diagnoses:  None    Rx / DC Orders ED Discharge Orders     None

## 2023-08-15 NOTE — ED Provider Notes (Incomplete)
[facility] Provider Note   CSN: [id number] Arrival date & time: 08/15/23  2228     History {Add pertinent medical, surgical, social history, OB history to HPI:1} Chief Complaint  Patient presents with  . Shortness of Breath    Shelby Jordan is a 73 y.o. female who presents via EMS with concern for altered mental status and shortness of breath.  Per family, patient lives with her daughter and granddaughter.  They noted that she has been ill, coughing with production for the last few days.  This evening was confused and disoriented, prompting EMS call.  Satting 80% on room air at time of fire arrival, sats improved to 88% with 4 L oxygen by nasal cannula.  EMS placed patient on nonrebreather with 1 mg Atrovent, 20 mg? albuterol and Solu-Medrol and route.  Oriented x 2 only to herself and place. Level 5 caveat due to acuity of presentation upon arrival.   Of note patient was recently admitted to the hospital with discharge on 08/12/2023 with breakthrough seizures after running out of depakote but reassuring long-term monitoring EEG. On depakote now, no sz today per family at bedside. Collateral hx provided by patient's daughter and granddaughter with whom she lives; pt with 24 hours of coughing, confusion this evening with increased WOB prompting EMS call.   HX hypertension, schizophrenia, hepatitis C, bipolar, seizures.  Patient is not anticoagulated.  HPI     Home Medications Prior to Admission medications   Medication Sig Start Date End Date Taking? Authorizing Provider  albuterol (VENTOLIN HFA) 108 (90 Base) MCG/ACT inhaler Inhale 2 puffs into the lungs every 4 (four) hours as needed for shortness of breath. 08/09/23   Ghimire, Werner Lean, MD  amLODipine (NORVASC) 5 MG tablet Take 1 tablet (5 mg total) by mouth daily. 08/07/23 08/06/24  Delton Prairie, MD  Blood Pressure Monitoring (BLOOD PRESSURE CUFF) MISC 1 each by Does not apply route  daily. 12/10/22   Mort Sawyers, FNP  divalproex (DEPAKOTE) 250 MG DR tablet Take 2 tablets (500 mg total) by mouth 2 (two) times daily. 08/09/23   Ghimire, Werner Lean, MD  loperamide (IMODIUM) 2 MG capsule Take 1 capsule (2 mg total) by mouth as needed for diarrhea or loose stools. 08/12/23   Ghimire, Werner Lean, MD  magnesium gluconate (MAGONATE) 500 MG tablet Take 500 mg by mouth at bedtime.    [provider]  metoprolol succinate (TOPROL-XL) 50 MG 24 hr tablet TAKE 1 TABLET BY MOUTH EVERY DAY 03/08/23   Mort Sawyers, FNP  OLANZapine (ZYPREXA) 10 MG tablet Take 1 tablet (10 mg total) by mouth at bedtime. 08/09/23 08/08/24  Ghimire, Werner Lean, MD  potassium chloride (KLOR-CON M) 10 MEQ tablet Take 1 tablet (10 mEq total) by mouth daily. 08/09/23   Ghimire, Werner Lean, MD  traZODone (DESYREL) 50 MG tablet Take 1 tablet (50 mg total) by mouth at bedtime as needed for sleep. 08/09/23   Ghimire, Werner Lean, MD  Vibegron (GEMTESA) 75 MG TABS Take 1 tablet (75 mg total) by mouth daily. 08/09/23   Ghimire, Werner Lean, MD  WIXELA INHUB 100-50 MCG/ACT AEPB INHALE 1 PUFF INTO THE LUNGS TWICE A DAY Patient taking differently: Inhale 1 puff into the lungs daily as needed (for shortness of breath). 03/15/23   Mort Sawyers, FNP      Allergies    Tomato (diagnostic) and Citrus    Review of Systems   Review of Systems  Unable to perform  ROS: Mental status change    Physical Exam Updated Vital Signs BP (!) 150/68 (BP Location: Right Arm)   Pulse (!) 124   Temp (!) 101.2 F (38.4 C) (Oral)   Resp (!) 27   Ht 5\' 4"  (1.626 m)   Wt 94 kg   SpO2 100%   BMI 35.57 kg/m  Physical Exam Vitals and nursing note reviewed.  Constitutional:      Appearance: She is ill-appearing. She is not toxic-appearing.  HENT:     Head: Normocephalic and atraumatic.     Mouth/Throat:     Mouth: Mucous membranes are moist.     Pharynx: Oropharynx is clear. Uvula midline. No oropharyngeal exudate or posterior  oropharyngeal erythema.  Eyes:     General: Lids are normal. Vision grossly intact.        Right eye: No discharge.        Left eye: No discharge.     Conjunctiva/sclera: Conjunctivae normal.     Pupils: Pupils are equal, round, and reactive to light.  Neck:     Trachea: Trachea and phonation normal.  Cardiovascular:     Rate and Rhythm: Regular rhythm. Tachycardia present.     Pulses: Normal pulses.  Pulmonary:     Effort: Tachypnea, accessory muscle usage and respiratory distress present.     Breath sounds: Examination of the right-middle field reveals rales. Examination of the right-lower field reveals rales. Rales present. No wheezing.  Chest:     Chest wall: No mass, tenderness or edema.  Abdominal:     General: Bowel sounds are normal. There is no distension.     Palpations: Abdomen is soft.     Tenderness: There is no abdominal tenderness.  Musculoskeletal:        General: No deformity.     Cervical back: Neck supple.     Right lower leg: No edema.     Left lower leg: No edema.  Lymphadenopathy:     Cervical: No cervical adenopathy.  Skin:    General: Skin is warm and dry.     Capillary Refill: Capillary refill takes less than 2 seconds.  Neurological:     General: No focal deficit present.     Mental Status: She is alert.     GCS: GCS eye subscore is 4. GCS verbal subscore is 4. GCS motor subscore is 5.  Psychiatric:        Mood and Affect: Mood normal.     ED Results / Procedures / Treatments   Labs (all labs ordered are listed, but only abnormal results are displayed) Labs Reviewed  RESP PANEL BY RT-PCR (RSV, FLU A&B, COVID)  RVPGX2  CULTURE, BLOOD (ROUTINE X 2)  CULTURE, BLOOD (ROUTINE X 2)  RESP PANEL BY RT-PCR (RSV, FLU A&B, COVID)  RVPGX2  COMPREHENSIVE METABOLIC PANEL  CBC WITH DIFFERENTIAL/PLATELET  PROTIME-INR  APTT  URINALYSIS, W/ REFLEX TO CULTURE (INFECTION SUSPECTED)  I-STAT CG4 LACTIC ACID, ED  I-STAT CHEM 8, ED  TROPONIN I (HIGH  SENSITIVITY)    EKG None  Radiology No results found.  Procedures .Critical Care  Performed by: Paris Lore, PA-C Authorized by: Paris Lore, PA-C   Critical care provider statement:    Critical care time (minutes):  45   Critical care was time spent personally by me on the following activities:  Development of treatment plan with patient or surrogate, discussions with consultants, evaluation of patient's response to treatment, examination of patient, obtaining history from patient or  surrogate, ordering and performing treatments and interventions, ordering and review of laboratory studies, ordering and review of radiographic studies, pulse oximetry and re-evaluation of patient's condition   {Document cardiac monitor, telemetry assessment procedure when appropriate:1}  Medications Ordered in ED Medications  lactated ringers infusion (has no administration in time range)  lactated ringers bolus 1,000 mL (has no administration in time range)    And  lactated ringers bolus 1,000 mL (has no administration in time range)    And  lactated ringers bolus 1,000 mL (has no administration in time range)  ceFEPIme (MAXIPIME) 2 g in sodium chloride 0.9 % 100 mL IVPB (has no administration in time range)  vancomycin (VANCOREADY) IVPB 2000 mg/400 mL (has no administration in time range)    ED Course/ Medical Decision Making/ A&P   {   Click here for ABCD2, HEART and other calculatorsREFRESH Note before signing :1}                              Medical Decision Making 73 year old female who presents with respiratory distress.  Septic upon presentation, febrile tachycardic and on nonrebreather with nebulizing treatment going.  Tachycardic to the 120s, has received significant dosage of albuterol with EMS prior to arrival.  Pulmonary exam with crackles in the right base and midlung, question possible left basilar crackles as well.  Abdomen soft nondistended nontender, no  lower extremity edema. Code sepsis activated, HCAP coverage.  Amount and/or Complexity of Data Reviewed Labs: ordered.    Details: CBC without leukocytosis, CMP with Cr of 1.3 at baseline, troponin flat, INR flat, RVP flu A+. Radiology: ordered.  Risk Prescription drug management. Decision regarding hospitalization.   ***Patient weaned from NRB to 4L by this provider with maintenance of sats >95%. WOB improved following nebs. Mental status improving, but continues to be mildly confused. Will benefit from admission to hospital for further stabilization.   {Document critical care time when appropriate:1} {Document review of labs and clinical decision tools ie heart score, Chads2Vasc2 etc:1}  {Document your independent review of radiology images, and any outside records:1} {Document your discussion with family members, caretakers, and with consultants:1} {Document social determinants of health affecting pt's care:1} {Document your decision making why or why not admission, treatments were needed:1} Final Clinical Impression(s) / ED Diagnoses Final diagnoses:  None    Rx / DC Orders ED Discharge Orders     None

## 2023-08-15 NOTE — Sepsis Progress Note (Signed)
Elink following for sepsis protocol.

## 2023-08-16 ENCOUNTER — Inpatient Hospital Stay (HOSPITAL_COMMUNITY): Payer: 59

## 2023-08-16 ENCOUNTER — Other Ambulatory Visit (HOSPITAL_COMMUNITY): Payer: Self-pay

## 2023-08-16 DIAGNOSIS — A4189 Other specified sepsis: Secondary | ICD-10-CM | POA: Diagnosis present

## 2023-08-16 DIAGNOSIS — F319 Bipolar disorder, unspecified: Secondary | ICD-10-CM | POA: Diagnosis present

## 2023-08-16 DIAGNOSIS — Z79899 Other long term (current) drug therapy: Secondary | ICD-10-CM | POA: Diagnosis not present

## 2023-08-16 DIAGNOSIS — Y95 Nosocomial condition: Secondary | ICD-10-CM

## 2023-08-16 DIAGNOSIS — Z888 Allergy status to other drugs, medicaments and biological substances status: Secondary | ICD-10-CM | POA: Diagnosis not present

## 2023-08-16 DIAGNOSIS — J440 Chronic obstructive pulmonary disease with acute lower respiratory infection: Secondary | ICD-10-CM | POA: Diagnosis present

## 2023-08-16 DIAGNOSIS — Z6835 Body mass index (BMI) 35.0-35.9, adult: Secondary | ICD-10-CM | POA: Diagnosis not present

## 2023-08-16 DIAGNOSIS — R5381 Other malaise: Secondary | ICD-10-CM | POA: Diagnosis present

## 2023-08-16 DIAGNOSIS — I700 Atherosclerosis of aorta: Secondary | ICD-10-CM | POA: Diagnosis not present

## 2023-08-16 DIAGNOSIS — Z818 Family history of other mental and behavioral disorders: Secondary | ICD-10-CM | POA: Diagnosis not present

## 2023-08-16 DIAGNOSIS — Z8619 Personal history of other infectious and parasitic diseases: Secondary | ICD-10-CM | POA: Diagnosis not present

## 2023-08-16 DIAGNOSIS — J189 Pneumonia, unspecified organism: Secondary | ICD-10-CM | POA: Diagnosis present

## 2023-08-16 DIAGNOSIS — R6520 Severe sepsis without septic shock: Secondary | ICD-10-CM | POA: Diagnosis present

## 2023-08-16 DIAGNOSIS — Z91018 Allergy to other foods: Secondary | ICD-10-CM | POA: Diagnosis not present

## 2023-08-16 DIAGNOSIS — F209 Schizophrenia, unspecified: Secondary | ICD-10-CM | POA: Diagnosis present

## 2023-08-16 DIAGNOSIS — J208 Acute bronchitis due to other specified organisms: Secondary | ICD-10-CM | POA: Diagnosis present

## 2023-08-16 DIAGNOSIS — Z7401 Bed confinement status: Secondary | ICD-10-CM | POA: Diagnosis not present

## 2023-08-16 DIAGNOSIS — Z7951 Long term (current) use of inhaled steroids: Secondary | ICD-10-CM | POA: Diagnosis not present

## 2023-08-16 DIAGNOSIS — E66811 Obesity, class 1: Secondary | ICD-10-CM | POA: Diagnosis present

## 2023-08-16 DIAGNOSIS — K5730 Diverticulosis of large intestine without perforation or abscess without bleeding: Secondary | ICD-10-CM | POA: Diagnosis not present

## 2023-08-16 DIAGNOSIS — J101 Influenza due to other identified influenza virus with other respiratory manifestations: Secondary | ICD-10-CM | POA: Diagnosis present

## 2023-08-16 DIAGNOSIS — R404 Transient alteration of awareness: Secondary | ICD-10-CM | POA: Diagnosis not present

## 2023-08-16 DIAGNOSIS — J9601 Acute respiratory failure with hypoxia: Secondary | ICD-10-CM | POA: Diagnosis present

## 2023-08-16 DIAGNOSIS — N1831 Chronic kidney disease, stage 3a: Secondary | ICD-10-CM | POA: Diagnosis present

## 2023-08-16 DIAGNOSIS — G40909 Epilepsy, unspecified, not intractable, without status epilepticus: Secondary | ICD-10-CM | POA: Diagnosis present

## 2023-08-16 DIAGNOSIS — A419 Sepsis, unspecified organism: Secondary | ICD-10-CM | POA: Diagnosis not present

## 2023-08-16 DIAGNOSIS — R590 Localized enlarged lymph nodes: Secondary | ICD-10-CM | POA: Diagnosis not present

## 2023-08-16 DIAGNOSIS — I129 Hypertensive chronic kidney disease with stage 1 through stage 4 chronic kidney disease, or unspecified chronic kidney disease: Secondary | ICD-10-CM | POA: Diagnosis present

## 2023-08-16 DIAGNOSIS — J4540 Moderate persistent asthma, uncomplicated: Secondary | ICD-10-CM | POA: Diagnosis not present

## 2023-08-16 DIAGNOSIS — Z87440 Personal history of urinary (tract) infections: Secondary | ICD-10-CM | POA: Diagnosis not present

## 2023-08-16 DIAGNOSIS — Z743 Need for continuous supervision: Secondary | ICD-10-CM | POA: Diagnosis not present

## 2023-08-16 DIAGNOSIS — Z87891 Personal history of nicotine dependence: Secondary | ICD-10-CM | POA: Diagnosis not present

## 2023-08-16 DIAGNOSIS — E7800 Pure hypercholesterolemia, unspecified: Secondary | ICD-10-CM | POA: Diagnosis present

## 2023-08-16 DIAGNOSIS — E872 Acidosis, unspecified: Secondary | ICD-10-CM | POA: Diagnosis present

## 2023-08-16 LAB — BLOOD CULTURE ID PANEL (REFLEXED) - BCID2

## 2023-08-16 LAB — URINALYSIS, W/ REFLEX TO CULTURE (INFECTION SUSPECTED)
Bilirubin Urine: NEGATIVE
Glucose, UA: NEGATIVE mg/dL
Hgb urine dipstick: NEGATIVE
Ketones, ur: NEGATIVE mg/dL
Leukocytes,Ua: NEGATIVE
Nitrite: NEGATIVE
Protein, ur: NEGATIVE mg/dL
Specific Gravity, Urine: 1.012 (ref 1.005–1.030)
pH: 7 (ref 5.0–8.0)

## 2023-08-16 LAB — BASIC METABOLIC PANEL
Anion gap: 14 (ref 5–15)
BUN: 13 mg/dL (ref 8–23)
CO2: 17 mmol/L — ABNORMAL LOW (ref 22–32)
Calcium: 8.5 mg/dL — ABNORMAL LOW (ref 8.9–10.3)
Chloride: 104 mmol/L (ref 98–111)
Creatinine, Ser: 1.22 mg/dL — ABNORMAL HIGH (ref 0.44–1.00)
GFR, Estimated: 47 mL/min — ABNORMAL LOW (ref >60)
Glucose, Bld: 202 mg/dL — ABNORMAL HIGH (ref 70–99)
Potassium: 3.8 mmol/L (ref 3.5–5.1)
Sodium: 135 mmol/L (ref 135–145)

## 2023-08-16 LAB — LACTIC ACID, PLASMA
Lactic Acid, Venous: 6.4 mmol/L (ref 0.5–1.9)
Lactic Acid, Venous: 6 mmol/L (ref 0.5–1.9)

## 2023-08-16 LAB — TROPONIN I (HIGH SENSITIVITY): Troponin I (High Sensitivity): 8 ng/L (ref <18)

## 2023-08-16 LAB — I-STAT CG4 LACTIC ACID, ED: Lactic Acid, Venous: 6.4 mmol/L (ref 0.5–1.9)

## 2023-08-16 LAB — CBC
HCT: 38.1 % (ref 36.0–46.0)
Hemoglobin: 11.8 g/dL — ABNORMAL LOW (ref 12.0–15.0)
MCH: 29.1 pg (ref 26.0–34.0)
MCHC: 31 g/dL (ref 30.0–36.0)
MCV: 93.8 fL (ref 80.0–100.0)
Platelets: 211 10*3/uL (ref 150–400)
RBC: 4.06 MIL/uL (ref 3.87–5.11)
RDW: 14.2 % (ref 11.5–15.5)
WBC: 6.5 10*3/uL (ref 4.0–10.5)
nRBC: 0 % (ref 0.0–0.2)

## 2023-08-16 LAB — MRSA NEXT GEN BY PCR, NASAL: MRSA by PCR Next Gen: NOT DETECTED

## 2023-08-16 MED ORDER — LACTATED RINGERS IV BOLUS
1000.0000 mL | Freq: Once | INTRAVENOUS | Status: AC
  Administered 2023-08-16: 1000 mL via INTRAVENOUS

## 2023-08-16 MED ORDER — ACETAMINOPHEN 325 MG PO TABS
650.0000 mg | ORAL_TABLET | Freq: Four times a day (QID) | ORAL | Status: DC | PRN
Start: 2023-08-16 — End: 2023-08-20

## 2023-08-16 MED ORDER — DIVALPROEX SODIUM 500 MG PO DR TAB
500.0000 mg | DELAYED_RELEASE_TABLET | Freq: Two times a day (BID) | ORAL | Status: DC
Start: 2023-08-16 — End: 2023-08-20
  Administered 2023-08-16 – 2023-08-20 (×10): 500 mg via ORAL
  Filled 2023-08-16 (×3): qty 2
  Filled 2023-08-16 (×3): qty 1
  Filled 2023-08-16: qty 2
  Filled 2023-08-16 (×3): qty 1

## 2023-08-16 MED ORDER — ONDANSETRON HCL 4 MG PO TABS: 4.0000 mg | ORAL_TABLET | Freq: Four times a day (QID) | ORAL | Status: DC | PRN

## 2023-08-16 MED ORDER — AMLODIPINE BESYLATE 5 MG PO TABS
5.0000 mg | ORAL_TABLET | Freq: Every day | ORAL | Status: DC
  Administered 2023-08-16 – 2023-08-20 (×5): 5 mg via ORAL
  Filled 2023-08-16 (×5): qty 1

## 2023-08-16 MED ORDER — OXYCODONE HCL 5 MG PO TABS: 5.0000 mg | ORAL_TABLET | ORAL | Status: DC | PRN

## 2023-08-16 MED ORDER — SODIUM CHLORIDE 0.9 % IV SOLN
2.0000 g | Freq: Two times a day (BID) | INTRAVENOUS | Status: DC
  Administered 2023-08-16 – 2023-08-18 (×5): 2 g via INTRAVENOUS
  Filled 2023-08-16 (×5): qty 12.5

## 2023-08-16 MED ORDER — METOPROLOL SUCCINATE ER 50 MG PO TB24
50.0000 mg | ORAL_TABLET | Freq: Every day | ORAL | Status: DC
  Administered 2023-08-16 – 2023-08-20 (×5): 50 mg via ORAL
  Filled 2023-08-16: qty 1
  Filled 2023-08-16: qty 2
  Filled 2023-08-16: qty 1
  Filled 2023-08-16: qty 2
  Filled 2023-08-16: qty 1

## 2023-08-16 MED ORDER — OSELTAMIVIR PHOSPHATE 30 MG PO CAPS
30.0000 mg | ORAL_CAPSULE | Freq: Two times a day (BID) | ORAL | Status: DC
  Administered 2023-08-16 – 2023-08-19 (×9): 30 mg via ORAL
  Filled 2023-08-16 (×10): qty 1

## 2023-08-16 MED ORDER — ENOXAPARIN SODIUM 40 MG/0.4ML IJ SOSY
40.0000 mg | PREFILLED_SYRINGE | INTRAMUSCULAR | Status: DC
Start: 2023-08-16 — End: 2023-08-20
  Administered 2023-08-16 – 2023-08-19 (×4): 40 mg via SUBCUTANEOUS
  Filled 2023-08-16 (×5): qty 0.4

## 2023-08-16 MED ORDER — VANCOMYCIN HCL IN DEXTROSE 1-5 GM/200ML-% IV SOLN
1000.0000 mg | INTRAVENOUS | Status: DC
  Administered 2023-08-16: 1000 mg via INTRAVENOUS
  Filled 2023-08-16: qty 200

## 2023-08-16 MED ORDER — ONDANSETRON HCL 4 MG/2ML IJ SOLN: 4.0000 mg | Freq: Four times a day (QID) | INTRAMUSCULAR | Status: DC | PRN

## 2023-08-16 MED ORDER — ACETAMINOPHEN 650 MG RE SUPP
650.0000 mg | Freq: Four times a day (QID) | RECTAL | Status: DC | PRN
Start: 2023-08-16 — End: 2023-08-20

## 2023-08-16 NOTE — Progress Notes (Signed)
PHARMACY - PHYSICIAN COMMUNICATION CRITICAL VALUE ALERT - BLOOD CULTURE IDENTIFICATION (BCID)  Shelby Jordan is an 73 y.o. female who presented to [facility] on 08/15/2023 with a chief complaint of Flu pneumonia.   Assessment:  Blood cultures 1 of 3 anaerobic growing staph epi with MecA resistance. Possible contaminant.    Name of physician (or Provider) Contacted: Dr. Idelle Leech  Current antibiotics: vancomycin and cefepime for  PNA  Changes to prescribed antibiotics recommended:  No changes recommended. F/u final cultures.  No results found for this or any previous visit.  Alphia Moh, PharmD, BCPS, BCCP Clinical Pharmacist  Please check AMION for all [facility] phone numbers After 10:00 PM, call Main Pharmacy [phone]

## 2023-08-16 NOTE — ED Notes (Signed)
Present nurse spoked with Idelle Leech MD, concerns for respiratory crackles, and continuous infusion. Doctor Idelle Leech recommended pause fluids and recheck lactic at 10 am

## 2023-08-16 NOTE — Care Plan (Addendum)
This 73 yrs old female with PMH significant of bipolar, depression, hypertension, prior seizures, hyperlipidemia who presented to the ED with altered mental status and progressively worsening shortness of breath. Patient has been having fever and chills for the last 2 days.  Patient was recently discharged from hospital on 08/12/2023.  During that hospitalization she presented with breakthrough seizures likely related to missed doses of Depakote.  She was resumed on home medication and eventually discharged with outpatient follow-up.  She suddenly developed shortness of breath over the last 2 days.  She was hypoxic with SpO2 of 80% on room air at home and was placed on 4 L.  Patient was brought in the ED for further assessment.  She is found to be flu a positive started on Tamiflu.  She was started on empiric antibiotics for suspected HCAP. CT chest > No acute findings were noted in the chest , abdomen or pelvis to account for patient's symptoms.  Patient is admitted for further evaluation.  Patient was seen and examined at bedside.

## 2023-08-16 NOTE — H&P (Signed)
History and Physical    Brayli Klingbeil WUJ:[id number] DOB: 07-16-1951 DOA: 08/15/2023  PCP: Mort Sawyers, FNP   Chief Complaint: Shortness of breath  HPI: Shelby Jordan is a 73 y.o. female with medical history significant of bipolar, depression, hypertension, prior seizure, hyperlipidemia who presented to the emergency department with altered mental status and progressively worsening shortness of breath.  Patient has been having fever and chills for the last 2 days.She was just discharged from the hospital on 1/23.  During this hospitalization she presented with breakthrough seizures likely related to missed doses of Depakote.  She was resumed on home medications and eventually discharged with outpatient follow-up.  She suddenly developed shortness of breath over the last 2 days.  She was satting in the 80s at home and was placed on 4 L nasal cannula.  EMS was called and transported her to the ER for further assessment.  On arrival she was afebrile and hemodynamically stable.  Labs were obtained which showed flu positive, creatinine 1.3 at baseline, WBC 9.0, hemoglobin 13.3, INR 1.1, lactic acid 3.7.  Patient underwent chest x-ray which showed no acute findings.  Patient was admitted for further workup.   Review of Systems: Review of Systems  Constitutional:  Positive for chills, fever and malaise/fatigue.  HENT: Negative.    Eyes: Negative.   Respiratory: Negative.    Cardiovascular: Negative.   Gastrointestinal: Negative.   Genitourinary: Negative.   Musculoskeletal: Negative.   Skin: Negative.   Neurological: Negative.   Endo/Heme/Allergies: Negative.   Psychiatric/Behavioral: Negative.       As per HPI otherwise 10 point review of systems negative.   Allergies  Allergen Reactions   Olanzapine Other (See Comments)    Parkinson-like symptoms   Tomato (Diagnostic) Other (See Comments)    Tongue break out.    Citrus Rash    Past Medical History:  Diagnosis Date   Bipolar 1  disorder (HCC)    Depression    Hepatitis C    High cholesterol    Hypertension    Seizures (HCC)    UTI (urinary tract infection)     Past Surgical History:  Procedure Laterality Date   APPENDECTOMY     BREAST BIOPSY Right 03/04/2022   stereo bx, mass "X" clip-path pending   TOE AMPUTATION       reports that she quit smoking about 2 years ago. Her smoking use included cigarettes. She started smoking about 32 years ago. She has a 30 pack-year smoking history. She has been exposed to tobacco smoke. She has never used smokeless tobacco. She reports that she does not drink alcohol and does not use drugs.  Family History  Problem Relation Age of Onset   Mental illness Mother        born in mental hospital   Breast cancer Neg Hx     Prior to Admission medications   Medication Sig Start Date End Date Taking? Authorizing Provider  albuterol (VENTOLIN HFA) 108 (90 Base) MCG/ACT inhaler Inhale 2 puffs into the lungs every 4 (four) hours as needed for shortness of breath. 08/09/23  Yes Ghimire, Werner Lean, MD  amLODipine (NORVASC) 5 MG tablet Take 1 tablet (5 mg total) by mouth daily. 08/07/23 08/06/24 Yes Delton Prairie, MD  divalproex (DEPAKOTE) 250 MG DR tablet Take 2 tablets (500 mg total) by mouth 2 (two) times daily. 08/09/23  Yes Ghimire, Werner Lean, MD  loperamide (IMODIUM) 2 MG capsule Take 1 capsule (2 mg total) by mouth as needed for diarrhea  or loose stools. 08/12/23  Yes Ghimire, Werner Lean, MD  magnesium gluconate (MAGONATE) 500 MG tablet Take 500 mg by mouth at bedtime.   Yes [provider]  metoprolol succinate (TOPROL-XL) 50 MG 24 hr tablet TAKE 1 TABLET BY MOUTH EVERY DAY 03/08/23  Yes Dugal, Tabitha, FNP  traZODone (DESYREL) 50 MG tablet Take 1 tablet (50 mg total) by mouth at bedtime as needed for sleep. 08/09/23  Yes Ghimire, Werner Lean, MD  Vibegron (GEMTESA) 75 MG TABS Take 1 tablet (75 mg total) by mouth daily. 08/09/23  Yes Ghimire, Werner Lean, MD  WIXELA INHUB 100-50  MCG/ACT AEPB INHALE 1 PUFF INTO THE LUNGS TWICE A DAY Patient taking differently: Inhale 1 puff into the lungs daily as needed (for shortness of breath). 03/15/23  Yes Dugal, Wyatt Mage, FNP  potassium chloride (KLOR-CON M) 10 MEQ tablet Take 1 tablet (10 mEq total) by mouth daily. Patient not taking: Reported on 08/16/2023 08/09/23   Maretta Bees, MD    Physical Exam: Vitals:   08/15/23 2330 08/15/23 2345 08/16/23 0030 08/16/23 0047  BP: (!) 150/62 (!) 162/74 (!) 164/75   Pulse: (!) 125 (!) 117 (!) 109   Resp: (!) 28 (!) 23 20   Temp:    98.1 F (36.7 C)  TempSrc:    Oral  SpO2: 99% 99% 95%   Weight:      Height:       Physical Exam Constitutional:      Appearance: She is normal weight.  HENT:     Head: Normocephalic.     Mouth/Throat:     Mouth: Mucous membranes are moist.  Eyes:     Pupils: Pupils are equal, round, and reactive to light.  Cardiovascular:     Rate and Rhythm: Normal rate and regular rhythm.  Pulmonary:     Effort: Pulmonary effort is normal.     Breath sounds: Normal breath sounds.  Abdominal:     Palpations: Abdomen is soft.  Musculoskeletal:        General: Normal range of motion.     Cervical back: Normal range of motion.  Skin:    Capillary Refill: Capillary refill takes less than 2 seconds.  Neurological:     General: No focal deficit present.     Mental Status: She is alert.  Psychiatric:        Mood and Affect: Mood normal.        Labs on Admission: I have personally reviewed the patients's labs and imaging studies.  Assessment/Plan Principal Problem:   Nosocomial pneumonia   # Acute hypoxic respiratory failure most likely secondary to influenza # Sepsis secondary to fluid # Lactic acidosis - Patient does not have infiltrates on chest x-ray to suggest bacterial pneumonia -Recent hospitalization for breakthrough fever - Flu positive - Patient's lactic jump from previous 6 after fluids and antibiotics  Plan: Start  Tamiflu Continue supportive care Obtain CT chest and pelvis to further assess Trend lactic acidosis  # Lactic acidosis-likely related to influenza.  Continue volume resuscitation  # CKD stage IIIa-trend creatinine  # Hypertension-continue amlodipine, metoprolol  # COPD-continue home inhalers  # Bipolar disorder-continue Depakote  # History of prior seizure-continue Depakote    Admission status: Inpatient Progressive  Certification: The appropriate patient status for this patient is INPATIENT. Inpatient status is judged to be reasonable and necessary in order to provide the required intensity of service to ensure the patient's safety. The patient's presenting symptoms, physical exam findings, and initial  radiographic and laboratory data in the context of their chronic comorbidities is felt to place them at high risk for further clinical deterioration. Furthermore, it is not anticipated that the patient will be medically stable for discharge from the hospital within 2 midnights of admission.   * I certify that at the point of admission it is my clinical judgment that the patient will require inpatient hospital care spanning beyond 2 midnights from the point of admission due to high intensity of service, high risk for further deterioration and high frequency of surveillance required.Alan Mulder MD [facility] If 7PM-7AM, please contact night-coverage [email or web address]  08/16/2023, 12:48 AM

## 2023-08-16 NOTE — Progress Notes (Signed)
[facility] night cross cover note:   I was notified by RN that the patient's lactate remains elevated, most recently 6.4 relative decubitus recent prior values of 5.7 and 6.4.  Per patient's RN, the patient has received 4 L of IV fluid, and is currently receiving continuous lactated Ringer's at 150 cc/h.  Most recent set of vital signs notable for the following: Afebrile; heart rates in the 90s; blood pressure 127/70, respiratory rate 17-18, and oxygen saturation 95 to 97%.  Will continue the existing lactated Ringer's, and I have ordered a repeat lactic acid level to be checked around 8 AM this morning.    Newton Pigg, DO Hospitalist

## 2023-08-16 NOTE — Progress Notes (Signed)
Pharmacy Antibiotic Note  Shelby Jordan is a 73 y.o. female admitted on 08/15/2023 with sepsis 2/2 respiratory infection.  Pharmacy has been consulted for vancomycin and cefepime dosing.  Plan: Cefepime 2g q12h  Vancomycin 1000mg  q24h (eAUC 455, Scr 1.3) F/u renal function, infectious work up and length of therapy  Vancomycin levels as needed  Height: 5\' 4"  (162.6 cm) Weight: 94 kg (207 lb 3.7 oz) IBW/kg (Calculated) : 54.7  Temp (24hrs), Avg:100.2 F (37.9 C), Min:98.1 F (36.7 C), Max:101.2 F (38.4 C)  Recent Labs  Lab 08/09/23 0342 08/15/23 2253 08/15/23 2310 08/15/23 2312 08/16/23 0047  WBC 6.0 9.0  --   --   --   CREATININE 1.26* 1.30* 1.30*  --   --   LATICACIDVEN  --   --   --  3.7* 6.4*    Estimated Creatinine Clearance: 43.5 mL/min (A) (by C-G formula based on SCr of 1.3 mg/dL (H)).    Allergies  Allergen Reactions   Olanzapine Other (See Comments)    Parkinson-like symptoms   Tomato (Diagnostic) Other (See Comments)    Tongue break out.    Citrus Rash    Antimicrobials this admission: Cefepime 1/26 > Vancomycin 1/26 >  Microbiology results: 1/26 Bcx: IP  1/26 resp panel: Flu A + 1/27 MRSA nares: IP   Thank you for allowing pharmacy to be a part of this patient's care.  Marja Kays 08/16/2023 1:20 AM

## 2023-08-17 LAB — PHOSPHORUS: Phosphorus: 3 mg/dL (ref 2.5–4.6)

## 2023-08-17 LAB — BASIC METABOLIC PANEL
Anion gap: 11 (ref 5–15)
BUN: 18 mg/dL (ref 8–23)
CO2: 24 mmol/L (ref 22–32)
Calcium: 9.3 mg/dL (ref 8.9–10.3)
Chloride: 102 mmol/L (ref 98–111)
Creatinine, Ser: 1.08 mg/dL — ABNORMAL HIGH (ref 0.44–1.00)
GFR, Estimated: 55 mL/min — ABNORMAL LOW (ref >60)
Glucose, Bld: 79 mg/dL (ref 70–99)
Potassium: 5.5 mmol/L — ABNORMAL HIGH (ref 3.5–5.1)
Sodium: 137 mmol/L (ref 135–145)

## 2023-08-17 LAB — CBC
HCT: 40.5 % (ref 36.0–46.0)
Hemoglobin: 13.2 g/dL (ref 12.0–15.0)
MCH: 28.8 pg (ref 26.0–34.0)
MCHC: 32.6 g/dL (ref 30.0–36.0)
MCV: 88.4 fL (ref 80.0–100.0)
Platelets: 266 10*3/uL (ref 150–400)
RBC: 4.58 MIL/uL (ref 3.87–5.11)
RDW: 14.4 % (ref 11.5–15.5)
WBC: 7.4 10*3/uL (ref 4.0–10.5)
nRBC: 0 % (ref 0.0–0.2)

## 2023-08-17 LAB — LACTIC ACID, PLASMA
Lactic Acid, Venous: 5.7 mmol/L (ref 0.5–1.9)
Lactic Acid, Venous: 1.4 mmol/L (ref 0.5–1.9)

## 2023-08-17 LAB — MAGNESIUM: Magnesium: 2.1 mg/dL (ref 1.7–2.4)

## 2023-08-17 MED ORDER — ALBUTEROL SULFATE (2.5 MG/3ML) 0.083% IN NEBU
2.5000 mg | INHALATION_SOLUTION | RESPIRATORY_TRACT | Status: DC | PRN
  Administered 2023-08-17: 2.5 mg via RESPIRATORY_TRACT
  Filled 2023-08-17: qty 3

## 2023-08-17 NOTE — Progress Notes (Signed)
[facility] night cross cover note:   Per family's request, I am ordering a prn albuterol nebulizer for expiratory wheezing.  Oxygen saturations currently 92% on 2 L nasal cannula, relative to requiring 4 L nasal cannula on the morning of 08/16/23.     Newton Pigg, DO Hospitalist

## 2023-08-17 NOTE — Progress Notes (Signed)
PROGRESS NOTE    Shelby Jordan  WUJ:[id number] DOB: March 28, 1951 DOA: 08/15/2023 PCP: Mort Sawyers, FNP   Brief Narrative: This 73 yrs old female with PMH significant of bipolar, depression, hypertension, prior seizures, hyperlipidemia who presented to the ED with altered mental status and progressively worsening shortness of breath. Patient has been having fever and chills for the last 2 days.  Patient was recently discharged from hospital on 08/12/2023.  During that hospitalization she presented with breakthrough seizures likely related to missed doses of Depakote.  She was resumed on home medication and eventually discharged with outpatient follow-up.  She suddenly developed shortness of breath over the last 2 days.  She was hypoxic with SpO2 of 80% on room air at home and was placed on 4 L.  Patient was brought in the ED for further assessment.  She is found to be flu A +, started on Tamiflu.  She was started on empiric antibiotics for suspected HCAP. CT chest > No acute findings were noted in the chest , abdomen or pelvis to account for patient's symptoms.  Patient is admitted for further evaluation.   Assessment & Plan:   Principal Problem:   Nosocomial pneumonia  Acute hypoxic respiratory failure most likely secondary to influenza: Patient was found hypoxic requiring 4 L of supplemental oxygen. Continue supplemental oxygen and wean as tolerated. She is found to have influenza A positive. Continue Tamiflu x 5 days. Chest X-ray does not have infiltrates to suggest bacterial pneumonia. Patient was recently hospitalized for breakthrough seizures.  Sepsis secondary to suspected pneumonia; Lactic acidosis:  Patient presented with tachycardia, tachypnea, hypoxia, lactic acidosis. Recently hospitalized, empirically started on HCAP coverage. Continue IV vancomycin and cefepime for now. Continue IV fluid resuscitation, trend lactic acid. Continue supportive care. CTA > No acute findings  noted in the chest abdomen or pelvis to account for patient's symptoms. Follow up blood and urine culture. 1 of 4 blood cultures grew Staph epidermidis likely contaminant.  CKD stage IIIa: Serum creatinine at baseline, trend creatinine.   Essential Hypertension: Continue amlodipine, metoprolol.   COPD:  No Acute exacerbation.  Continue home inhalers   Bipolar disorder: Continue Depakote.   History of prior seizure: Continue Depakote.    DVT prophylaxis: Lovenox Code Status: Full code Family Communication: No family at bedside.  Disposition Plan:    Status is: Inpatient Remains inpatient appropriate because: Severity of illness   Consultants:  None  Procedures: None  Antimicrobials:  Anti-infectives (From admission, onward)    Start     Dose/Rate Route Frequency Ordered Stop   08/16/23 2300  vancomycin (VANCOCIN) IVPB 1000 mg/200 mL premix        1,000 mg 200 mL/hr over 60 Minutes Intravenous Every 24 hours 08/16/23 0232     08/16/23 1000  ceFEPIme (MAXIPIME) 2 g in sodium chloride 0.9 % 100 mL IVPB        2 g 200 mL/hr over 30 Minutes Intravenous Every 12 hours 08/16/23 0232     08/16/23 0100  oseltamivir (TAMIFLU) capsule 30 mg        30 mg Oral 2 times daily 08/16/23 0050 08/20/23 2159   08/15/23 2300  vancomycin (VANCOCIN) IVPB 1000 mg/200 mL premix  Status:  Discontinued        1,000 mg 200 mL/hr over 60 Minutes Intravenous  Once 08/15/23 2246 08/15/23 2248   08/15/23 2300  ceFEPIme (MAXIPIME) 2 g in sodium chloride 0.9 % 100 mL IVPB        2  g 200 mL/hr over 30 Minutes Intravenous  Once 08/15/23 2246 08/15/23 2351   08/15/23 2300  vancomycin (VANCOREADY) IVPB 2000 mg/400 mL        2,000 mg 200 mL/hr over 120 Minutes Intravenous  Once 08/15/23 2248 08/16/23 0159        Subjective: Patient was seen and examined at bedside.  Overnight events noted.   Patient reports feeling much improved.  Blood pressure has improved.   She remains on 4 L of supplemental  oxygen.  Denies any chest pain or cough.  Objective: Vitals:   08/17/23 0245 08/17/23 0330 08/17/23 0415 08/17/23 0724  BP: (!) 142/63 135/65 139/65 (!) 162/97  Pulse: 71 66 66 70  Resp: 17 17 17 18   Temp:      TempSrc:      SpO2: 100% 99% 100% 100%  Weight:      Height:        Intake/Output Summary (Last 24 hours) at 08/17/2023 1201 Last data filed at 08/16/2023 1908 Gross per 24 hour  Intake 2141.94 ml  Output --  Net 2141.94 ml   Filed Weights   08/15/23 2240  Weight: 94 kg    Examination:  General exam: Appears calm and comfortable, deconditioned, not in any acute distress. Respiratory system: Clear to auscultation. Respiratory effort normal.  RR 15 Cardiovascular system: S1 & S2 heard, RRR. No JVD, murmurs, rubs, gallops or clicks.  Gastrointestinal system: Abdomen is non distended, soft and non tender.  Normal bowel sounds heard. Central nervous system: Alert and oriented x 3. No focal neurological deficits. Extremities: No edema, no cyanosis, no clubbing Skin: No rashes, lesions or ulcers Psychiatry: Judgement and insight appear normal. Mood & affect appropriate.     Data Reviewed: I have personally reviewed following labs and imaging studies  CBC: Recent Labs  Lab 08/15/23 2253 08/15/23 2310 08/16/23 0408  WBC 9.0  --  6.5  NEUTROABS 7.2  --   --   HGB 13.3 14.3 11.8*  HCT 40.8 42.0 38.1  MCV 88.9  --  93.8  PLT 262  --  211   Basic Metabolic Panel: Recent Labs  Lab 08/15/23 2253 08/15/23 2310 08/16/23 0408  NA 136 136 135  K 4.1 4.0 3.8  CL 99 101 104  CO2 23  --  17*  GLUCOSE 207* 208* 202*  BUN 13 16 13   CREATININE 1.30* 1.30* 1.22*  CALCIUM 9.3  --  8.5*   GFR: Estimated Creatinine Clearance: 46.3 mL/min (A) (by C-G formula based on SCr of 1.22 mg/dL (H)). Liver Function Tests: Recent Labs  Lab 08/15/23 2253  AST 25  ALT 20  ALKPHOS 36*  BILITOT 0.6  PROT 7.1  ALBUMIN 3.8   No results for input(s): "LIPASE", "AMYLASE" in the  last 168 hours. No results for input(s): "AMMONIA" in the last 168 hours. Coagulation Profile: Recent Labs  Lab 08/15/23 2253  INR 1.1   Cardiac Enzymes: No results for input(s): "CKTOTAL", "CKMB", "CKMBINDEX", "TROPONINI" in the last 168 hours. BNP (last 3 results) No results for input(s): "PROBNP" in the last 8760 hours. HbA1C: No results for input(s): "HGBA1C" in the last 72 hours. CBG: No results for input(s): "GLUCAP" in the last 168 hours. Lipid Profile: No results for input(s): "CHOL", "HDL", "LDLCALC", "TRIG", "CHOLHDL", "LDLDIRECT" in the last 72 hours. Thyroid Function Tests: No results for input(s): "TSH", "T4TOTAL", "FREET4", "T3FREE", "THYROIDAB" in the last 72 hours. Anemia Panel: No results for input(s): "VITAMINB12", "FOLATE", "FERRITIN", "TIBC", "IRON", "RETICCTPCT"  in the last 72 hours. Sepsis Labs: Recent Labs  Lab 08/16/23 0047 08/16/23 0105 08/16/23 0408 08/16/23 0800  LATICACIDVEN 6.4* 5.7* 6.4* 6.0*    Recent Results (from the past 240 hours)  Respiratory (~20 pathogens) panel by PCR     Status: None   Collection Time: 08/08/23  8:51 AM   Specimen: Nasopharyngeal Swab; Respiratory  Result Value Ref Range Status   Adenovirus NOT DETECTED NOT DETECTED Final   Coronavirus 229E NOT DETECTED NOT DETECTED Final    Comment: (NOTE) The Coronavirus on the Respiratory Panel, DOES NOT test for the novel  Coronavirus (2019 nCoV)    Coronavirus HKU1 NOT DETECTED NOT DETECTED Final   Coronavirus NL63 NOT DETECTED NOT DETECTED Final   Coronavirus OC43 NOT DETECTED NOT DETECTED Final   Metapneumovirus NOT DETECTED NOT DETECTED Final   Rhinovirus / Enterovirus NOT DETECTED NOT DETECTED Final   Influenza A NOT DETECTED NOT DETECTED Final   Influenza B NOT DETECTED NOT DETECTED Final   Parainfluenza Virus 1 NOT DETECTED NOT DETECTED Final   Parainfluenza Virus 2 NOT DETECTED NOT DETECTED Final   Parainfluenza Virus 3 NOT DETECTED NOT DETECTED Final    Parainfluenza Virus 4 NOT DETECTED NOT DETECTED Final   Respiratory Syncytial Virus NOT DETECTED NOT DETECTED Final   Bordetella pertussis NOT DETECTED NOT DETECTED Final   Bordetella Parapertussis NOT DETECTED NOT DETECTED Final   Chlamydophila pneumoniae NOT DETECTED NOT DETECTED Final   Mycoplasma pneumoniae NOT DETECTED NOT DETECTED Final    Comment: Performed at [facility] Lab, [address], [place], [place] [address]  SARS Coronavirus 2 by RT PCR (hospital order, performed in [facility] hospital lab) *cepheid single result test* Anterior Nasal Swab     Status: None   Collection Time: 08/08/23  8:51 AM   Specimen: Anterior Nasal Swab  Result Value Ref Range Status   SARS Coronavirus 2 by RT PCR NEGATIVE NEGATIVE Final    Comment: Performed at [facility] Lab, [address], [place], [place] [address]  Blood Culture (routine x 2)     Status: None (Preliminary result)   Collection Time: 08/15/23 10:51 PM   Specimen: BLOOD  Result Value Ref Range Status   Specimen Description BLOOD BLOOD RIGHT HAND  Final   Special Requests   Final    BOTTLES DRAWN AEROBIC ONLY Blood Culture results may not be optimal due to an inadequate volume of blood received in culture bottles   Culture   Final    NO GROWTH 2 DAYS Performed at [facility] Lab, [address], [place], [place] [address]    Report Status PENDING  Incomplete  Resp panel by RT-PCR (RSV, Flu A&B, Covid) Anterior Nasal Swab     Status: Abnormal   Collection Time: 08/15/23 10:53 PM   Specimen: Anterior Nasal Swab  Result Value Ref Range Status   SARS Coronavirus 2 by RT PCR NEGATIVE NEGATIVE Final   Influenza A by PCR POSITIVE (A) NEGATIVE Final   Influenza B by PCR NEGATIVE NEGATIVE Final    Comment: (NOTE) The Xpert Xpress SARS-CoV-2/FLU/RSV plus assay is intended as an aid in the diagnosis of influenza from Nasopharyngeal swab specimens and should not be used as a sole basis for treatment. Nasal washings  and aspirates are unacceptable for Xpert Xpress SARS-CoV-2/FLU/RSV testing.  Fact Sheet for Patients: BloggerCourse.com  Fact Sheet for Healthcare Providers: SeriousBroker.it  This test is not yet approved or cleared by the [place] and  has been authorized for detection and/or diagnosis of SARS-CoV-2 by FDA under an Emergency Use Authorization (EUA). This EUA will remain in effect (meaning this test can be used) for the duration of the COVID-19 declaration under Section 564(b)(1) of the Act, 21 U.S.C. section 360bbb-3(b)(1), unless the authorization is terminated or revoked.     Resp Syncytial Virus by PCR NEGATIVE NEGATIVE Final    Comment: (NOTE) Fact Sheet for Patients: BloggerCourse.com  Fact Sheet for Healthcare Providers: SeriousBroker.it  This test is not yet approved or cleared by the [place] FDA and has been authorized for detection and/or diagnosis of SARS-CoV-2 by FDA under an Emergency Use Authorization (EUA). This EUA will remain in effect (meaning this test can be used) for the duration of the COVID-19 declaration under Section 564(b)(1) of the Act, 21 U.S.C. section 360bbb-3(b)(1), unless the authorization is terminated or revoked.  Performed at [facility] Lab, [address] [place], [place] [address]   Blood Culture (routine x 2)     Status: None (Preliminary result)   Collection Time: 08/15/23 10:53 PM   Specimen: BLOOD  Result Value Ref Range Status   Specimen Description BLOOD RIGHT ANTECUBITAL  Final   Special Requests   Final    BOTTLES DRAWN AEROBIC AND ANAEROBIC Blood Culture results may not be optimal due to an inadequate volume of blood received in culture bottles   Culture  Setup Time   Final    GRAM POSITIVE COCCI IN CLUSTERS IN BOTH AEROBIC AND ANAEROBIC BOTTLES CRITICAL RESULT CALLED TO, READ BACK BY AND VERIFIED WITH:  PHARMD L. Imogene Burn [id number] @ [phone] FH Performed at [facility] Lab, [address] [place], [place] [address]    Culture GRAM POSITIVE COCCI  Final   Report Status PENDING  Incomplete  Blood Culture ID Panel (Reflexed)     Status: Abnormal   Collection Time: 08/15/23 10:53 PM  Result Value Ref Range Status   Enterococcus faecalis NOT DETECTED NOT DETECTED Final   Enterococcus Faecium NOT DETECTED NOT DETECTED Final   Listeria monocytogenes NOT DETECTED NOT DETECTED Final   Staphylococcus species DETECTED (A) NOT DETECTED Final    Comment: CRITICAL RESULT CALLED TO, READ BACK BY AND VERIFIED WITH: PHARMD L. CHEN [id number] @ 1649 FH    Staphylococcus aureus (BCID) NOT DETECTED NOT DETECTED Final   Staphylococcus epidermidis DETECTED (A) NOT DETECTED Final    Comment: Methicillin (oxacillin) resistant coagulase negative staphylococcus. Possible blood culture contaminant (unless isolated from more than one blood culture draw or clinical case suggests pathogenicity). No antibiotic treatment is indicated for blood  culture contaminants. CRITICAL RESULT CALLED TO, READ BACK BY AND VERIFIED WITH: PHARMD L. CHEN [id number] @ 1649 FH    Staphylococcus lugdunensis NOT DETECTED NOT DETECTED Final   Streptococcus species NOT DETECTED NOT DETECTED Final   Streptococcus agalactiae NOT DETECTED NOT DETECTED Final   Streptococcus pneumoniae NOT DETECTED NOT DETECTED Final   Streptococcus pyogenes NOT DETECTED NOT DETECTED Final   A.calcoaceticus-baumannii NOT DETECTED NOT DETECTED Final   Bacteroides fragilis NOT DETECTED NOT DETECTED Final   Enterobacterales NOT DETECTED NOT DETECTED Final   Enterobacter cloacae complex NOT DETECTED NOT DETECTED Final   Escherichia coli NOT DETECTED NOT DETECTED Final   Klebsiella aerogenes NOT DETECTED NOT DETECTED Final   Klebsiella oxytoca NOT DETECTED NOT DETECTED Final   Klebsiella pneumoniae NOT DETECTED NOT DETECTED Final   Proteus species NOT DETECTED NOT DETECTED  Final   Salmonella species NOT DETECTED NOT DETECTED  Final   Serratia marcescens NOT DETECTED NOT DETECTED Final   Haemophilus influenzae NOT DETECTED NOT DETECTED Final   Neisseria meningitidis NOT DETECTED NOT DETECTED Final   Pseudomonas aeruginosa NOT DETECTED NOT DETECTED Final   Stenotrophomonas maltophilia NOT DETECTED NOT DETECTED Final   Candida albicans NOT DETECTED NOT DETECTED Final   Candida auris NOT DETECTED NOT DETECTED Final   Candida glabrata NOT DETECTED NOT DETECTED Final   Candida krusei NOT DETECTED NOT DETECTED Final   Candida parapsilosis NOT DETECTED NOT DETECTED Final   Candida tropicalis NOT DETECTED NOT DETECTED Final   Cryptococcus neoformans/gattii NOT DETECTED NOT DETECTED Final   Methicillin resistance mecA/C DETECTED (A) NOT DETECTED Final    Comment: CRITICAL RESULT CALLED TO, READ BACK BY AND VERIFIED WITH: PHARMD L. Imogene Burn [id number] @ [phone] FH Performed at [facility] Lab, [address] [place], [place] [address]   MRSA Next Gen by PCR, Nasal     Status: None   Collection Time: 08/16/23  2:14 AM   Specimen: Nasal Mucosa; Nasal Swab  Result Value Ref Range Status   MRSA by PCR Next Gen NOT DETECTED NOT DETECTED Final    Comment: (NOTE) The GeneXpert MRSA Assay (FDA approved for NASAL specimens only), is one component of a comprehensive MRSA colonization surveillance program. It is not intended to diagnose MRSA infection nor to guide or monitor treatment for MRSA infections. Test performance is not FDA approved in patients less than 9 years old. Performed at [facility], [address], [place], [place] [address]          Radiology Studies: CT CHEST ABDOMEN PELVIS WO CONTRAST Result Date: 08/16/2023 CLINICAL DATA:  73 year old female with history of shortness of breath. Sepsis. EXAM: CT CHEST, ABDOMEN AND PELVIS WITHOUT CONTRAST TECHNIQUE: Multidetector CT imaging of the chest, abdomen and pelvis was performed following the standard  protocol without IV contrast. RADIATION DOSE REDUCTION: This exam was performed according to the departmental dose-optimization program which includes automated exposure control, adjustment of the mA and/or kV according to patient size and/or use of iterative reconstruction technique. COMPARISON:  CT of the abdomen and pelvis 04/29/2022. FINDINGS: CT CHEST FINDINGS Cardiovascular: Heart size is normal. There is no significant pericardial fluid, thickening or pericardial calcification. Atherosclerotic calcifications are noted in the thoracic aorta and great vessels. No definite coronary artery calcifications. Mediastinum/Nodes: No pathologically enlarged mediastinal or hilar lymph nodes. Please note that accurate exclusion of hilar adenopathy is limited on noncontrast CT scans. Esophagus is unremarkable in appearance. No axillary lymphadenopathy. Lungs/Pleura: Mild scarring in the lung bases bilaterally. No acute consolidative airspace disease. No pleural effusions. No definite suspicious appearing pulmonary nodules or masses are noted. Musculoskeletal: There are no aggressive appearing lytic or blastic lesions noted in the visualized portions of the skeleton. CT ABDOMEN PELVIS FINDINGS Hepatobiliary: No definite suspicious cystic or solid hepatic lesions are confidently identified on today's noncontrast CT examination. Unenhanced appearance of the gallbladder is unremarkable. Pancreas: No definite pancreatic mass or peripancreatic fluid collections or inflammatory changes are confidently identified on today's noncontrast CT examination. Spleen: Unremarkable. Adrenals/Urinary Tract: Unenhanced appearance of the kidneys and bilateral adrenal glands is normal. No hydroureteronephrosis. Urinary bladder is unremarkable in appearance. Stomach/Bowel: Unenhanced appearance of the stomach is unremarkable. No pathologic dilatation of small bowel or colon. Numerous colonic diverticuli are noted, particularly in the sigmoid  colon, without surrounding inflammatory changes to indicate an acute diverticulitis at this time. Status post appendectomy. Vascular/Lymphatic: Atherosclerotic calcifications in the abdominal  aorta and pelvic vasculature. No lymphadenopathy noted in the abdomen or pelvis. Reproductive: Unenhanced appearance of the uterus and ovaries is unremarkable. Other: No significant volume of ascites.  No pneumoperitoneum. Musculoskeletal: There are no aggressive appearing lytic or blastic lesions noted in the visualized portions of the skeleton. IMPRESSION: 1. No acute findings are noted in the chest, abdomen or pelvis to account for the patient's symptoms. 2. Colonic diverticulosis without evidence of acute diverticulitis at this time. 3. Aortic atherosclerosis. Aortic Atherosclerosis (ICD10-I70.0). Electronically Signed   By: Trudie Reed M.D.   On: 08/16/2023 06:42   DG Chest Port 1 View Result Date: 08/15/2023 CLINICAL DATA:  Shortness of breath and possible sepsis EXAM: PORTABLE CHEST 1 VIEW COMPARISON:  08/11/2023 FINDINGS: Cardiac shadow is within normal limits. Lungs are well aerated bilaterally. No focal infiltrate or effusion is seen. No bony abnormality is noted. IMPRESSION: No active disease. Electronically Signed   By: Alcide Clever M.D.   On: 08/15/2023 23:02   Scheduled Meds:  amLODipine  5 mg Oral Daily   divalproex  500 mg Oral BID   enoxaparin (LOVENOX) injection  40 mg Subcutaneous Q24H   metoprolol succinate  50 mg Oral Daily   oseltamivir  30 mg Oral BID   Continuous Infusions:  ceFEPime (MAXIPIME) IV Stopped (08/17/23 1016)   vancomycin Stopped (08/17/23 0125)     LOS: 1 day    Time spent: 50 mins    Willeen Niece, MD [facility]   If 7PM-7AM, please contact night-coverage

## 2023-08-18 DIAGNOSIS — Y95 Nosocomial condition: Secondary | ICD-10-CM | POA: Diagnosis not present

## 2023-08-18 DIAGNOSIS — R6520 Severe sepsis without septic shock: Secondary | ICD-10-CM

## 2023-08-18 DIAGNOSIS — J189 Pneumonia, unspecified organism: Secondary | ICD-10-CM | POA: Diagnosis not present

## 2023-08-18 DIAGNOSIS — A419 Sepsis, unspecified organism: Secondary | ICD-10-CM

## 2023-08-18 DIAGNOSIS — J101 Influenza due to other identified influenza virus with other respiratory manifestations: Secondary | ICD-10-CM

## 2023-08-18 DIAGNOSIS — J9601 Acute respiratory failure with hypoxia: Secondary | ICD-10-CM

## 2023-08-18 LAB — CULTURE, BLOOD (ROUTINE X 2)

## 2023-08-18 LAB — CBC
HCT: 40.8 % (ref 36.0–46.0)
Hemoglobin: 13.8 g/dL (ref 12.0–15.0)
MCH: 29 pg (ref 26.0–34.0)
MCHC: 33.8 g/dL (ref 30.0–36.0)
MCV: 85.7 fL (ref 80.0–100.0)
Platelets: 274 10*3/uL (ref 150–400)
RBC: 4.76 MIL/uL (ref 3.87–5.11)
RDW: 14.2 % (ref 11.5–15.5)
WBC: 5.3 10*3/uL (ref 4.0–10.5)
nRBC: 0 % (ref 0.0–0.2)

## 2023-08-18 LAB — RESPIRATORY PANEL BY PCR

## 2023-08-18 LAB — BASIC METABOLIC PANEL
Anion gap: 8 (ref 5–15)
BUN: 16 mg/dL (ref 8–23)
CO2: 29 mmol/L (ref 22–32)
Calcium: 9.2 mg/dL (ref 8.9–10.3)
Chloride: 98 mmol/L (ref 98–111)
Creatinine, Ser: 1.16 mg/dL — ABNORMAL HIGH (ref 0.44–1.00)
GFR, Estimated: 50 mL/min — ABNORMAL LOW (ref >60)
Glucose, Bld: 100 mg/dL — ABNORMAL HIGH (ref 70–99)
Potassium: 4.5 mmol/L (ref 3.5–5.1)
Sodium: 135 mmol/L (ref 135–145)

## 2023-08-18 LAB — PHOSPHORUS: Phosphorus: 3.2 mg/dL (ref 2.5–4.6)

## 2023-08-18 LAB — MAGNESIUM: Magnesium: 1.9 mg/dL (ref 1.7–2.4)

## 2023-08-18 MED ORDER — BENZONATATE 100 MG PO CAPS: 200.0000 mg | ORAL_CAPSULE | Freq: Three times a day (TID) | ORAL | Status: DC | PRN

## 2023-08-18 MED ORDER — IPRATROPIUM-ALBUTEROL 0.5-2.5 (3) MG/3ML IN SOLN
3.0000 mL | Freq: Three times a day (TID) | RESPIRATORY_TRACT | Status: DC
  Administered 2023-08-18 – 2023-08-20 (×6): 3 mL via RESPIRATORY_TRACT
  Filled 2023-08-18 (×6): qty 3

## 2023-08-18 NOTE — Plan of Care (Signed)

## 2023-08-18 NOTE — TOC Progression Note (Signed)
Transition of Care [facility]) - Progression Note    Patient Details  Name: Shelby Jordan MRN: [id number] Date of Birth: 12-19-1950  [facility] of [facility] CM/SW Contact  Mearl Latin, LCSW Phone Number: 08/18/2023, 6:08 PM  Clinical Narrative:    CSW spoke with patient's daughter to confirm discharge plan. She reported that patient went to [facility] and it was an awful experience so she picked patient up and took her home with her. She said things were going fine but then patient started flu symptoms. She stated she will be taking patient home at discharge and requests Home health and DME and asked for increased aide hours as patient only receives 3 hours per day now. CSW will update RNCM to see if application can be completed.    Expected Discharge Plan: Home w Home Health Services Barriers to Discharge: Continued Medical Work up  Expected Discharge Plan and Services In-house Referral: Clinical Social Work     Living arrangements for the past 2 months: Single Family Home                                       Social Determinants of Health (SDOH) Interventions SDOH Screenings   Food Insecurity: Patient Unable To Answer (08/16/2023)  Housing: Patient Unable To Answer (08/16/2023)  Transportation Needs: Patient Unable To Answer (08/16/2023)  Utilities: Patient Unable To Answer (08/16/2023)  Alcohol Screen: Low Risk  (07/20/2022)  Depression (PHQ2-9): Low Risk  (04/15/2023)  Financial Resource Strain: Low Risk  (05/13/2022)  Physical Activity: Inactive (05/13/2022)  Social Connections: Patient Unable To Answer (08/16/2023)  Stress: No Stress Concern Present (05/13/2022)  Tobacco Use: Medium Risk (08/15/2023)    Readmission Risk Interventions     No data to display

## 2023-08-18 NOTE — TOC Initial Note (Signed)
Transition of Care [facility]) - Initial/Assessment Note    Patient Details  Name: Shelby Jordan MRN: [id number] Date of Birth: August 08, 1950  Transition of Care [facility]) CM/SW Contact:    Kermit Balo, RN Phone Number: 08/18/2023, 2:08 PM  Clinical Narrative:                  Pt is from home with her daughter. She states her daughter is with her most of the time. She recently discharged to [facility] rehab.  Pts daughter manages her medications and provides needed transportation. PT recommending SNF. LCSW will see pt.  TOC following.  Expected Discharge Plan: Skilled Nursing Facility Barriers to Discharge: Continued Medical Work up   Patient Goals and CMS Choice            Expected Discharge Plan and Services In-house Referral: Clinical Social Work     Living arrangements for the past 2 months: Single Family Home                                      Prior Living Arrangements/Services Living arrangements for the past 2 months: Single Family Home Lives with:: Adult Children Patient language and need for interpreter reviewed:: Yes Do you feel safe going back to the place where you live?: Yes        Care giver support system in place?: Yes (comment) Current home services: DME (walker/ shower seat/) Criminal Activity/Legal Involvement Pertinent to Current Situation/Hospitalization: No - Comment as needed  Activities of Daily Living   ADL Screening (condition at time of admission) Independently performs ADLs?: No Does the patient have a NEW difficulty with bathing/dressing/toileting/self-feeding that is expected to last >3 days?: No (needs assist) Does the patient have a NEW difficulty with getting in/out of bed, walking, or climbing stairs that is expected to last >3 days?: No (needs assist) Does the patient have a NEW difficulty with communication that is expected to last >3 days?: No Is the patient deaf or have difficulty hearing?: No Does the patient have  difficulty seeing, even when wearing glasses/contacts?: No Does the patient have difficulty concentrating, remembering, or making decisions?: No  Permission Sought/Granted                  Emotional Assessment Appearance:: Appears stated age Attitude/Demeanor/Rapport: Engaged Affect (typically observed): Accepting Orientation: : Oriented to Self, Oriented to Place, Oriented to  Time, Oriented to Situation   Psych Involvement: No (comment)  Admission diagnosis:  Influenza A [J10.1] Nosocomial pneumonia [J18.9, Y95] Sepsis with acute hypoxic respiratory failure without septic shock, due to unspecified organism (HCC) [A41.9, R65.20, J96.01] Patient Active Problem List   Diagnosis Date Noted   Nosocomial pneumonia 08/16/2023   Seizure disorder (HCC) 08/10/2023   Seizure (HCC) 08/08/2023   Leukocytosis 08/08/2023   Metabolic acidosis with increased anion gap and accumulation of organic acids 08/08/2023   Hypokalemia 08/08/2023   Poor personal hygiene 04/19/2023   Tobacco abuse 04/15/2023   Urge incontinence of urine 02/09/2023   Moderate persistent asthma 12/15/2022   Vitamin D deficiency 12/15/2022   Primary hypertension 12/15/2022   Mixed stress and urge urinary incontinence 12/10/2022   Bipolar 1 disorder (HCC) 07/20/2022   Self-care deficit for dressing and grooming 06/28/2022   At risk for self care deficit 06/28/2022   Uterine leiomyoma 05/07/2022   Atherosclerosis of aorta (HCC) 05/04/2022   Bilateral inguinal hernia without obstruction or gangrene  05/04/2022   Abnormal MMSE 03/05/2022   Memory loss 03/05/2022   Osteopenia of left hip 02/02/2022   Schizoaffective disorder, bipolar type (HCC) 11/28/2021   Hyperglycemia 11/28/2021   History of hepatitis C 11/28/2021   Chronic kidney disease, stage 3a (HCC) 11/28/2021   Obesity (BMI 35.0-39.9 without comorbidity) 11/28/2021   Chronic obstructive pulmonary disease (HCC) 11/28/2021   Mixed hyperlipidemia 11/28/2021    Post-menopausal 11/28/2021   Left lower quadrant abdominal mass 11/28/2021   History of tobacco abuse 11/28/2021   Bipolar I disorder, most recent episode (or current) manic (HCC) 12/15/2019   Essential hypertension 12/15/2019   PCP:  Mort Sawyers, FNP Pharmacy:   [phone] - [address], [place] - [address] [place] [address] Phone: [phone] Fax: [phone]  [facility] [address] [place] [address] Phone: [phone] Fax: [phone]     Social Drivers of Health (SDOH) Social History: SDOH Screenings   Food Insecurity: Patient Unable To Answer (08/16/2023)  Housing: Patient Unable To Answer (08/16/2023)  Transportation Needs: Patient Unable To Answer (08/16/2023)  Utilities: Patient Unable To Answer (08/16/2023)  Alcohol Screen: Low Risk  (07/20/2022)  Depression (PHQ2-9): Low Risk  (04/15/2023)  Financial Resource Strain: Low Risk  (05/13/2022)  Physical Activity: Inactive (05/13/2022)  Social Connections: Patient Unable To Answer (08/16/2023)  Stress: No Stress Concern Present (05/13/2022)  Tobacco Use: Medium Risk (08/15/2023)   SDOH Interventions:     Readmission Risk Interventions     No data to display

## 2023-08-18 NOTE — Consult Note (Signed)
[facility] Liaison Consult Note   08/18/2023  Shelby Jordan 11/03/1950 [id number]  Insurance: [employer] Dual Complete   Primary Care Provider: Mort Sawyers, FNP with [facility], this provider is listed for the transition of care follow up appointments  and [facility] TOC calls  08/18/23 1:00 PM RN Hospital Liaison rounding note [patient is on Droplet precautions, PPE reserved for hospital staff]    The patient was screened for 7 and 30 day readmission hospitalization with noted medium risk score for unplanned readmission risk 2 hospital admissions in 6 months.  The patient was assessed for potential Community Care Coordination service needs for post hospital transition for care coordination. Review of patient's electronic medical record reveals patient had been active with [facility] LCSW for adult day programs with family in the past.  .   Plan: [facility] Liaison will continue to follow progress and disposition to asess for post hospital community care coordination needs.  Referral request for community care coordination: Reached out to [facility] LCSW for any ongoing activity.   [facility] Community Care, Population Health does not replace or interfere with any arrangements made by the Inpatient Transition of Care team.   For questions contact:   Charlesetta Shanks, RN, BSN, CCM [place]  [facility], [facility] Liaison Direct Dial: [phone] or secure chat Email: [email or web address]

## 2023-08-18 NOTE — Progress Notes (Signed)
PROGRESS NOTE        PATIENT DETAILS Name: Shelby Jordan Age: 73 y.o. Sex: female Date of Birth: October 02, 1950 Admit Date: 08/15/2023 Admitting Physician Alan Mulder, MD WUX:[id number], Wyatt Mage, FNP  Brief Summary: Patient is a 73 y.o.  female with a history of HTN, HLD, bipolar disorder, seizure disorder-who was hospitalized from 1/19-1/23-for breakthrough seizures/unsteady gait-discharged to SNF-readmitted on 1/27 for shortness of breath-found to have influenza A infection.  Significant events: 1/19-1/23>> hospitalization for breakthrough seizures/unsteady gait-DC to SNF 1/27>> admit to [facility]-respiratory failure secondary to influenza infection.  Significant studies: 1/26>> CXR: No PNA 1/27>> CT chest/abdomen/pelvis: No acute findings.  Significant microbiology data: 1/26>> influenza A PCR: Positive 1/26>> COVID/influenza B/RSV PCR: Negative 1/26>> blood culture: Staph epidermidis/Staph hominis (contamination)  Procedures: None  Consults: None  Subjective: Feels much better today-on 2 L of oxygen.  Objective: Vitals: Blood pressure (!) 153/80, pulse 79, temperature 98.5 F (36.9 C), temperature source Oral, resp. rate 20, height 5\' 4"  (1.626 m), weight 94 kg, SpO2 92%.   Exam: Gen Exam:Alert awake-not in any distress HEENT:atraumatic, normocephalic Chest: B/L clear to auscultation anteriorly CVS:S1S2 regular Abdomen:soft non tender, non distended Extremities:no edema Neurology: Non focal Skin: no rash  Pertinent Labs/Radiology:    Latest Ref Rng & Units 08/18/2023    5:00 AM 08/17/2023    2:12 PM 08/16/2023    4:08 AM  CBC  WBC 4.0 - 10.5 K/uL 5.3  7.4  6.5   Hemoglobin 12.0 - 15.0 g/dL 10.2  72.5  36.6   Hematocrit 36.0 - 46.0 % 40.8  40.5  38.1   Platelets 150 - 400 K/uL 274  266  211     Lab Results  Component Value Date   NA 135 08/18/2023   K 4.5 08/18/2023   CL 98 08/18/2023   CO2 29 08/18/2023       Assessment/Plan: Sepsis secondary to influenza A infection (POA) Sepsis physiology has resolved with supportive care and Tamiflu Stop cefepime 1/29  Acute hypoxic respiratory failure-likely due to acute bronchitis in the setting of influenza A infection Improving rapidly Continue Tamiflu Either on room air or 2 L of oxygen. Continue supportive care-and titrate off oxygen.  CKD stage IIIa At baseline  HTN BP stable Amlodipine/metoprolol  Bipolar disorder Depakote  Seizures Depakote  Debility/deconditioning PT OT eval-SNF recommended  Class 1 Obesity: Estimated body mass index is 35.57 kg/m as calculated from the following:   Height as of this encounter: 5\' 4"  (1.626 m).   Weight as of this encounter: 94 kg.   Code status:   Code Status: Full Code   DVT Prophylaxis: enoxaparin (LOVENOX) injection 40 mg Start: 08/16/23 1000 SCDs Start: 08/16/23 0047   Family Communication: None at bedside   Disposition Plan: Status is: Inpatient Remains inpatient appropriate because: Severity of illness   Planned Discharge Destination:Skilled nursing facility vs HHPT   Diet: Diet Order             Diet regular Room service appropriate? Yes; Fluid consistency: Thin  Diet effective now                     Antimicrobial agents: Anti-infectives (From admission, onward)    Start     Dose/Rate Route Frequency Ordered Stop   08/16/23 2300  vancomycin (VANCOCIN) IVPB 1000 mg/200 mL premix  Status:  Discontinued  1,000 mg 200 mL/hr over 60 Minutes Intravenous Every 24 hours 08/16/23 0232 08/17/23 1532   08/16/23 1000  ceFEPIme (MAXIPIME) 2 g in sodium chloride 0.9 % 100 mL IVPB        2 g 200 mL/hr over 30 Minutes Intravenous Every 12 hours 08/16/23 0232     08/16/23 0100  oseltamivir (TAMIFLU) capsule 30 mg        30 mg Oral 2 times daily 08/16/23 0050 08/20/23 2159   08/15/23 2300  vancomycin (VANCOCIN) IVPB 1000 mg/200 mL premix  Status:  Discontinued         1,000 mg 200 mL/hr over 60 Minutes Intravenous  Once 08/15/23 2246 08/15/23 2248   08/15/23 2300  ceFEPIme (MAXIPIME) 2 g in sodium chloride 0.9 % 100 mL IVPB        2 g 200 mL/hr over 30 Minutes Intravenous  Once 08/15/23 2246 08/15/23 2351   08/15/23 2300  vancomycin (VANCOREADY) IVPB 2000 mg/400 mL        2,000 mg 200 mL/hr over 120 Minutes Intravenous  Once 08/15/23 2248 08/16/23 0159        MEDICATIONS: Scheduled Meds:  amLODipine  5 mg Oral Daily   divalproex  500 mg Oral BID   enoxaparin (LOVENOX) injection  40 mg Subcutaneous Q24H   metoprolol succinate  50 mg Oral Daily   oseltamivir  30 mg Oral BID   Continuous Infusions:  ceFEPime (MAXIPIME) IV 2 g (08/18/23 0941)   PRN Meds:.acetaminophen **OR** acetaminophen, albuterol, ondansetron **OR** ondansetron (ZOFRAN) IV, oxyCODONE   I have personally reviewed following labs and imaging studies  LABORATORY DATA: CBC: Recent Labs  Lab 08/15/23 2253 08/15/23 2310 08/16/23 0408 08/17/23 1412 08/18/23 0500  WBC 9.0  --  6.5 7.4 5.3  NEUTROABS 7.2  --   --   --   --   HGB 13.3 14.3 11.8* 13.2 13.8  HCT 40.8 42.0 38.1 40.5 40.8  MCV 88.9  --  93.8 88.4 85.7  PLT 262  --  211 266 274    Basic Metabolic Panel: Recent Labs  Lab 08/15/23 2253 08/15/23 2310 08/16/23 0408 08/17/23 1216 08/18/23 0500  NA 136 136 135 137 135  K 4.1 4.0 3.8 5.5* 4.5  CL 99 101 104 102 98  CO2 23  --  17* 24 29  GLUCOSE 207* 208* 202* 79 100*  BUN 13 16 13 18 16   CREATININE 1.30* 1.30* 1.22* 1.08* 1.16*  CALCIUM 9.3  --  8.5* 9.3 9.2  MG  --   --   --  2.1 1.9  PHOS  --   --   --  3.0 3.2    GFR: Estimated Creatinine Clearance: 48.7 mL/min (A) (by C-G formula based on SCr of 1.16 mg/dL (H)).  Liver Function Tests: Recent Labs  Lab 08/15/23 2253  AST 25  ALT 20  ALKPHOS 36*  BILITOT 0.6  PROT 7.1  ALBUMIN 3.8   No results for input(s): "LIPASE", "AMYLASE" in the last 168 hours. No results for input(s): "AMMONIA"  in the last 168 hours.  Coagulation Profile: Recent Labs  Lab 08/15/23 2253  INR 1.1    Cardiac Enzymes: No results for input(s): "CKTOTAL", "CKMB", "CKMBINDEX", "TROPONINI" in the last 168 hours.  BNP (last 3 results) No results for input(s): "PROBNP" in the last 8760 hours.  Lipid Profile: No results for input(s): "CHOL", "HDL", "LDLCALC", "TRIG", "CHOLHDL", "LDLDIRECT" in the last 72 hours.  Thyroid Function Tests: No results for input(s): "  TSH", "T4TOTAL", "FREET4", "T3FREE", "THYROIDAB" in the last 72 hours.  Anemia Panel: No results for input(s): "VITAMINB12", "FOLATE", "FERRITIN", "TIBC", "IRON", "RETICCTPCT" in the last 72 hours.  Urine analysis:    Component Value Date/Time   COLORURINE YELLOW 08/16/2023 0040   APPEARANCEUR CLEAR 08/16/2023 0040   APPEARANCEUR Clear 06/07/2023 1335   LABSPEC 1.012 08/16/2023 0040   PHURINE 7.0 08/16/2023 0040   GLUCOSEU NEGATIVE 08/16/2023 0040   GLUCOSEU NEGATIVE 07/16/2022 1459   HGBUR NEGATIVE 08/16/2023 0040   BILIRUBINUR NEGATIVE 08/16/2023 0040   BILIRUBINUR Negative 06/07/2023 1335   KETONESUR NEGATIVE 08/16/2023 0040   PROTEINUR NEGATIVE 08/16/2023 0040   UROBILINOGEN 0.2 02/11/2023 1440   UROBILINOGEN 0.2 07/16/2022 1459   NITRITE NEGATIVE 08/16/2023 0040   LEUKOCYTESUR NEGATIVE 08/16/2023 0040    Sepsis Labs: Lactic Acid, Venous    Component Value Date/Time   LATICACIDVEN 1.4 08/17/2023 1412    MICROBIOLOGY: Recent Results (from the past 240 hours)  Blood Culture (routine x 2)     Status: None (Preliminary result)   Collection Time: 08/15/23 10:51 PM   Specimen: BLOOD  Result Value Ref Range Status   Specimen Description BLOOD BLOOD RIGHT HAND  Final   Special Requests   Final    BOTTLES DRAWN AEROBIC ONLY Blood Culture results may not be optimal due to an inadequate volume of blood received in culture bottles   Culture   Final    NO GROWTH 3 DAYS Performed at [facility], [address],  [place], [place] [address]    Report Status PENDING  Incomplete  Resp panel by RT-PCR (RSV, Flu A&B, Covid) Anterior Nasal Swab     Status: Abnormal   Collection Time: 08/15/23 10:53 PM   Specimen: Anterior Nasal Swab  Result Value Ref Range Status   SARS Coronavirus 2 by RT PCR NEGATIVE NEGATIVE Final   Influenza A by PCR POSITIVE (A) NEGATIVE Final   Influenza B by PCR NEGATIVE NEGATIVE Final    Comment: (NOTE) The Xpert Xpress SARS-CoV-2/FLU/RSV plus assay is intended as an aid in the diagnosis of influenza from Nasopharyngeal swab specimens and should not be used as a sole basis for treatment. Nasal washings and aspirates are unacceptable for Xpert Xpress SARS-CoV-2/FLU/RSV testing.  Fact Sheet for Patients: BloggerCourse.com  Fact Sheet for Healthcare Providers: SeriousBroker.it  This test is not yet approved or cleared by the [place] FDA and has been authorized for detection and/or diagnosis of SARS-CoV-2 by FDA under an Emergency Use Authorization (EUA). This EUA will remain in effect (meaning this test can be used) for the duration of the COVID-19 declaration under Section 564(b)(1) of the Act, 21 U.S.C. section 360bbb-3(b)(1), unless the authorization is terminated or revoked.     Resp Syncytial Virus by PCR NEGATIVE NEGATIVE Final    Comment: (NOTE) Fact Sheet for Patients: BloggerCourse.com  Fact Sheet for Healthcare Providers: SeriousBroker.it  This test is not yet approved or cleared by the [place] FDA and has been authorized for detection and/or diagnosis of SARS-CoV-2 by FDA under an Emergency Use Authorization (EUA). This EUA will remain in effect (meaning this test can be used) for the duration of the COVID-19 declaration under Section 564(b)(1) of the Act, 21 U.S.C. section 360bbb-3(b)(1), unless the authorization is terminated  or revoked.  Performed at [facility], [address], [place], [place] [address]   Blood Culture (routine x 2)     Status: Abnormal   Collection Time: 08/15/23 10:53 PM  Specimen: BLOOD  Result Value Ref Range Status   Specimen Description BLOOD RIGHT ANTECUBITAL  Final   Special Requests   Final    BOTTLES DRAWN AEROBIC AND ANAEROBIC Blood Culture results may not be optimal due to an inadequate volume of blood received in culture bottles   Culture  Setup Time   Final    GRAM POSITIVE COCCI IN CLUSTERS IN BOTH AEROBIC AND ANAEROBIC BOTTLES CRITICAL RESULT CALLED TO, READ BACK BY AND VERIFIED WITH: PHARMD L. CHEN [id number] @ 1649 FH    Culture (A)  Final    STAPHYLOCOCCUS EPIDERMIDIS STAPHYLOCOCCUS HOMINIS THE SIGNIFICANCE OF ISOLATING THIS ORGANISM FROM A SINGLE SET OF BLOOD CULTURES WHEN MULTIPLE SETS ARE DRAWN IS UNCERTAIN. PLEASE NOTIFY THE MICROBIOLOGY DEPARTMENT WITHIN ONE WEEK IF SPECIATION AND SENSITIVITIES ARE REQUIRED. Performed at [facility], [address] [place], [place] [address]    Report Status 08/18/2023 FINAL  Final  Blood Culture ID Panel (Reflexed)     Status: Abnormal   Collection Time: 08/15/23 10:53 PM  Result Value Ref Range Status   Enterococcus faecalis NOT DETECTED NOT DETECTED Final   Enterococcus Faecium NOT DETECTED NOT DETECTED Final   Listeria monocytogenes NOT DETECTED NOT DETECTED Final   Staphylococcus species DETECTED (A) NOT DETECTED Final    Comment: CRITICAL RESULT CALLED TO, READ BACK BY AND VERIFIED WITH: PHARMD L. CHEN [id number] @ 1649 FH    Staphylococcus aureus (BCID) NOT DETECTED NOT DETECTED Final   Staphylococcus epidermidis DETECTED (A) NOT DETECTED Final    Comment: Methicillin (oxacillin) resistant coagulase negative staphylococcus. Possible blood culture contaminant (unless isolated from more than one blood culture draw or clinical case suggests pathogenicity). No antibiotic treatment is indicated for blood  culture  contaminants. CRITICAL RESULT CALLED TO, READ BACK BY AND VERIFIED WITH: PHARMD L. CHEN [id number] @ 1649 FH    Staphylococcus lugdunensis NOT DETECTED NOT DETECTED Final   Streptococcus species NOT DETECTED NOT DETECTED Final   Streptococcus agalactiae NOT DETECTED NOT DETECTED Final   Streptococcus pneumoniae NOT DETECTED NOT DETECTED Final   Streptococcus pyogenes NOT DETECTED NOT DETECTED Final   A.calcoaceticus-baumannii NOT DETECTED NOT DETECTED Final   Bacteroides fragilis NOT DETECTED NOT DETECTED Final   Enterobacterales NOT DETECTED NOT DETECTED Final   Enterobacter cloacae complex NOT DETECTED NOT DETECTED Final   Escherichia coli NOT DETECTED NOT DETECTED Final   Klebsiella aerogenes NOT DETECTED NOT DETECTED Final   Klebsiella oxytoca NOT DETECTED NOT DETECTED Final   Klebsiella pneumoniae NOT DETECTED NOT DETECTED Final   Proteus species NOT DETECTED NOT DETECTED Final   Salmonella species NOT DETECTED NOT DETECTED Final   Serratia marcescens NOT DETECTED NOT DETECTED Final   Haemophilus influenzae NOT DETECTED NOT DETECTED Final   Neisseria meningitidis NOT DETECTED NOT DETECTED Final   Pseudomonas aeruginosa NOT DETECTED NOT DETECTED Final   Stenotrophomonas maltophilia NOT DETECTED NOT DETECTED Final   Candida albicans NOT DETECTED NOT DETECTED Final   Candida auris NOT DETECTED NOT DETECTED Final   Candida glabrata NOT DETECTED NOT DETECTED Final   Candida krusei NOT DETECTED NOT DETECTED Final   Candida parapsilosis NOT DETECTED NOT DETECTED Final   Candida tropicalis NOT DETECTED NOT DETECTED Final   Cryptococcus neoformans/gattii NOT DETECTED NOT DETECTED Final   Methicillin resistance mecA/C DETECTED (A) NOT DETECTED Final    Comment: CRITICAL RESULT CALLED TO, READ BACK BY AND VERIFIED WITH: PHARMD L. Imogene Burn [id number] @ [phone] FH Performed at [facility], [address] [place], [place] [address]   MRSA Next Gen by PCR, Nasal     Status: None   Collection  Time: 08/16/23  2:14 AM   Specimen: Nasal Mucosa; Nasal Swab  Result Value Ref Range Status   MRSA by PCR Next Gen NOT DETECTED NOT DETECTED Final    Comment: (NOTE) The GeneXpert MRSA Assay (FDA approved for NASAL specimens only), is one component of a comprehensive MRSA colonization surveillance program. It is not intended to diagnose MRSA infection nor to guide or monitor treatment for MRSA infections. Test performance is not FDA approved in patients less than 2 years old. Performed at [facility], [address], [place], [place] [address]     RADIOLOGY STUDIES/RESULTS: No results found.   LOS: 2 days   Jeoffrey Massed, MD  [facility]    To contact the attending provider between 7A-7P or the covering provider during after hours 7P-7A, please log into the web site [email or web address] and access using universal [place] password for that web site. If you do not have the password, please call the hospital operator.  08/18/2023, 11:53 AM

## 2023-08-18 NOTE — Plan of Care (Signed)
  Problem: Education: Goal: Knowledge of General Education information will improve Description: Including pain rating scale, medication(s)/side effects and non-pharmacologic comfort measures Outcome: Progressing   Problem: Health Behavior/Discharge Planning: Goal: Ability to manage health-related needs will improve Outcome: Progressing   Problem: Clinical Measurements: Goal: Diagnostic test results will improve Outcome: Progressing Goal: Respiratory complications will improve Outcome: Progressing   Problem: Coping: Goal: Level of anxiety will decrease Outcome: Progressing

## 2023-08-18 NOTE — Progress Notes (Cosign Needed)
Pt primary diagnosis of nosocomial pneumonia r/t to influenza infection was treated with cefepime. She tolerated it well. No complaints of SOB but wheezing noted on auscultation. She's on 1L of O2 via [place]. Pt 1x assist to [facility]. Assisted with repositioning. Pt currently is resting comfortably with call bell within reach.

## 2023-08-18 NOTE — Progress Notes (Signed)
OT Cancellation Note  Patient Details Name: Shelby Jordan MRN: [id number] DOB: 06/07/1951   Cancelled Treatment:     OT attempting x2 to see patient, with patient just being helped off BSC, and then had been assisted back to bed. OT will follow back to complete evaluation at later time.   Pollyann Glen E. Bisma Klett, OTR/L Acute Rehabilitation Services [phone]   Cherlyn Cushing 08/18/2023, 2:48 PM

## 2023-08-18 NOTE — Evaluation (Signed)
Physical Therapy Evaluation Patient Details Name: Shelby Jordan MRN: [id number] DOB: [birth year] Today's Date: 08/18/2023  History of Present Illness  Pt is a 73 y/o F admitted on 08/15/23 after presenting with c/o AMS & progressively worsening SOB, as well as fever & chills x 2 days. Pt tested positive for Influenza A. Pt recently d/c from hospital on 08/12/23 after being treated for breakthrough seizures. PMH: bipolar, depression, HTN, seizures, HLD, CKD 3  Clinical Impression  Pt seen for PT evaluation with pt agreeable to tx. Pt reports prior to first hospitalization she was ambulatory without AD, negotiating flight of stairs without assistance to access her bed/bathroom but since d/c and being in rehab pt has been using RW. On this date, pt requires min assist for supine>sit but mod<>max assist to scoot to sitting EOB, min assist STS, & mod assist step pivot bed>recliner with RW. Pt with labored, wheezing breathing but SpO2 >90% throughout session on 1L/min via nasal cannula. Pt would benefit from ongoing PT services to address strengthening, balance, & endurance to increase independence & reduce fall risk with mobility.        If plan is discharge home, recommend the following: A lot of help with walking and/or transfers;A lot of help with bathing/dressing/bathroom;Assist for transportation;Assistance with cooking/housework;Help with stairs or ramp for entrance   Can travel by private vehicle   No    Equipment Recommendations Other (comment) (defer to next venue)  Recommendations for Other Services       Functional Status Assessment Patient has had a recent decline in their functional status and demonstrates the ability to make significant improvements in function in a reasonable and predictable amount of time.     Precautions / Restrictions Precautions Precautions: Fall Precaution Comments: seizures Restrictions Weight Bearing Restrictions Per Provider Order: No      Mobility   Bed Mobility Overal bed mobility: Needs Assistance Bed Mobility: Supine to Sit     Supine to sit: Min assist     General bed mobility comments: pt able to come to sitting EOB with min assist, requires mod<>max assist to scoot to sitting EOB    Transfers Overall transfer level: Needs assistance Equipment used: Rolling walker (2 wheels) Transfers: Sit to/from Stand, Bed to chair/wheelchair/BSC Sit to Stand: Min assist   Step pivot transfers: Mod assist (cuing re: stepping & sequencing, pt with shuffled steps/scoots to recliner, pauses at times & requires cuing to start again, posterior lean during transfer)       General transfer comment: STS from EOB & recliner with cuing re: hand placement, good ability to power to standing but decreased balance    Ambulation/Gait                  Stairs            Wheelchair Mobility     Tilt Bed    Modified Rankin (Stroke Patients Only)       Balance Overall balance assessment: Needs assistance Sitting-balance support: Feet supported, No upper extremity supported Sitting balance-Leahy Scale: Fair Sitting balance - Comments: close supervision static sitting EOB Postural control: Posterior lean Standing balance support: During functional activity, Bilateral upper extremity supported, Reliant on assistive device for balance Standing balance-Leahy Scale: Poor                               Pertinent Vitals/Pain Pain Assessment Pain Assessment: No/denies pain    Home Living Family/patient  expects to be discharged to:: Private residence Living Arrangements: Children Available Help at Discharge: Family;Available PRN/intermittently (family works during the day, pt home alone) Type of Home: House Home Access: Stairs to enter Entrance Stairs-Rails: None Secretary/administrator of Steps: 1   Home Layout: Two level;1/2 bath on main level;Bed/bath upstairs        Prior Function Prior Level of Function :  Needs assist             Mobility Comments: Pt reports prior to first hospital admission she was ambulatory without AD but has been using a walker since last admission.       Extremity/Trunk Assessment   Upper Extremity Assessment Upper Extremity Assessment: Generalized weakness    Lower Extremity Assessment Lower Extremity Assessment: Generalized weakness       Communication   Communication Communication: No apparent difficulties  Cognition Arousal: Alert   Overall Cognitive Status: No family/caregiver present to determine baseline cognitive functioning                                 General Comments: Pt oriented to situation, follows commands with extra time during session, appears to have delayed processing.        General Comments General comments (skin integrity, edema, etc.): pt on 1L/min via nasal cannula with SpO2 91% or greater    Exercises     Assessment/Plan    PT Assessment Patient needs continued PT services  PT Problem List Decreased strength;Decreased activity tolerance;Decreased balance;Decreased mobility;Decreased knowledge of use of DME;Decreased safety awareness;Decreased knowledge of precautions;Decreased cognition;Cardiopulmonary status limiting activity       PT Treatment Interventions DME instruction;Balance training;Modalities;Gait training;Neuromuscular re-education;Cognitive remediation;Stair training;Manual techniques;Therapeutic exercise;Patient/family education;Therapeutic activities;Functional mobility training    PT Goals (Current goals can be found in the Care Plan section)  Acute Rehab PT Goals Patient Stated Goal: get better PT Goal Formulation: With patient Time For Goal Achievement: 09/01/23 Potential to Achieve Goals: Fair    Frequency Min 1X/week     Co-evaluation               AM-PAC PT "6 Clicks" Mobility  Outcome Measure Help needed turning from your back to your side while in a flat bed  without using bedrails?: A Little Help needed moving from lying on your back to sitting on the side of a flat bed without using bedrails?: A Lot Help needed moving to and from a bed to a chair (including a wheelchair)?: A Lot Help needed standing up from a chair using your arms (e.g., wheelchair or bedside chair)?: A Little Help needed to walk in hospital room?: A Lot Help needed climbing 3-5 steps with a railing? : A Lot 6 Click Score: 14    End of Session Equipment Utilized During Treatment: Oxygen Activity Tolerance: Patient limited by fatigue Patient left: in chair;with call bell/phone within reach Nurse Communication: Mobility status PT Visit Diagnosis: Unsteadiness on feet (R26.81);Difficulty in walking, not elsewhere classified (R26.2);Other abnormalities of gait and mobility (R26.89);Muscle weakness (generalized) (M62.81)    Time: 4540-9811 PT Time Calculation (min) (ACUTE ONLY): 15 min   Charges:   PT Evaluation $PT Eval Moderate Complexity: 1 Mod   PT General Charges $$ ACUTE PT VISIT: 1 Visit         Aleda Grana, PT, DPT 08/18/23, 10:12 AM   Sandi Mariscal 08/18/2023, 10:11 AM

## 2023-08-19 DIAGNOSIS — J189 Pneumonia, unspecified organism: Secondary | ICD-10-CM | POA: Diagnosis not present

## 2023-08-19 DIAGNOSIS — A419 Sepsis, unspecified organism: Secondary | ICD-10-CM | POA: Diagnosis not present

## 2023-08-19 DIAGNOSIS — J101 Influenza due to other identified influenza virus with other respiratory manifestations: Secondary | ICD-10-CM | POA: Diagnosis not present

## 2023-08-19 DIAGNOSIS — Y95 Nosocomial condition: Secondary | ICD-10-CM | POA: Diagnosis not present

## 2023-08-19 MED ORDER — GUAIFENESIN ER 600 MG PO TB12
600.0000 mg | ORAL_TABLET | Freq: Two times a day (BID) | ORAL | Status: DC
  Administered 2023-08-19 – 2023-08-20 (×2): 600 mg via ORAL
  Filled 2023-08-19 (×2): qty 1

## 2023-08-19 NOTE — Progress Notes (Addendum)
PROGRESS NOTE        PATIENT DETAILS Name: Shelby Jordan Age: 73 y.o. Sex: female Date of Birth: 1951-01-22 Admit Date: 08/15/2023 Admitting Physician Alan Mulder, MD JXB:[id number], Wyatt Mage, FNP  Brief Summary: Patient is a 73 y.o.  female with a history of HTN, HLD, bipolar disorder, seizure disorder-who was hospitalized from 1/19-1/23-for breakthrough seizures/unsteady gait-discharged to SNF-readmitted on 1/27 for shortness of breath-found to have influenza A infection.  Significant events: 1/19-1/23>> hospitalization for breakthrough seizures/unsteady gait-DC to SNF 1/27>> admit to [facility]-respiratory failure secondary to influenza infection.  Significant studies: 1/26>> CXR: No PNA 1/27>> CT chest/abdomen/pelvis: No acute findings.  Significant microbiology data: 1/26>> influenza A PCR: Positive 1/26>> COVID/influenza B/RSV PCR: Negative 1/26>> blood culture: Staph epidermidis/Staph hominis (contamination)  Procedures: None  Consults: None  Subjective: No complaints-not keen on going back to SNF-on just 1 L of oxygen this morning.  Feels overall better than how she first presented to the hospital.  Objective: Vitals: Blood pressure (!) 133/57, pulse 67, temperature 98.1 F (36.7 C), temperature source Oral, resp. rate 17, height 5\' 4"  (1.626 m), weight 94 kg, SpO2 91%.   Exam: Gen Exam:Alert awake-not in any distress HEENT:atraumatic, normocephalic Chest: B/L clear to auscultation anteriorly CVS:S1S2 regular Abdomen:soft non tender, non distended Extremities:no edema Neurology: Non focal Skin: no rash  Pertinent Labs/Radiology:    Latest Ref Rng & Units 08/18/2023    5:00 AM 08/17/2023    2:12 PM 08/16/2023    4:08 AM  CBC  WBC 4.0 - 10.5 K/uL 5.3  7.4  6.5   Hemoglobin 12.0 - 15.0 g/dL 95.6  21.3  08.6   Hematocrit 36.0 - 46.0 % 40.8  40.5  38.1   Platelets 150 - 400 K/uL 274  266  211     Lab Results  Component Value Date   NA  135 08/18/2023   K 4.5 08/18/2023   CL 98 08/18/2023   CO2 29 08/18/2023      Assessment/Plan: Sepsis secondary to influenza A infection (POA) Sepsis physiology has resolved  Continue Tamiflu x 5 days.   Acute hypoxic respiratory failure-likely due to acute bronchitis in the setting of influenza A infection Overall improved Hardly any wheezing heard on exam today Continue Tamiflu On just 1 L of oxygen this morning-will attempt to titrate off.  CKD stage IIIa At baseline  HTN BP stable Amlodipine/metoprolol  Bipolar disorder Continue Depakote Discussed with daughter on 1/30-she reports that she stopped Zyprexa-she was concerned about development of extrapyramidal symptoms/Parkinson's disease.  She has an appointment with her psychiatrist on 2/7-hold Zyprexa until she sees her primary psychiatrist.  Seizures Depakote  Debility/deconditioning PT OT eval-SNF recommended-however patient/daughter declining SNF-and want to go home with home health.  Class 1 Obesity: Estimated body mass index is 35.57 kg/m as calculated from the following:   Height as of this encounter: 5\' 4"  (1.626 m).   Weight as of this encounter: 94 kg.   Code status:   Code Status: Full Code   DVT Prophylaxis: enoxaparin (LOVENOX) injection 40 mg Start: 08/16/23 1000 SCDs Start: 08/16/23 0047   Family Communication:Daughter-Lashawn Thompkins  [phone] -updated over the phone 1/30   Disposition Plan: Status is: Inpatient Remains inpatient appropriate because: Severity of illness   Planned Discharge Destination:Skilled nursing facility vs HHPT   Diet: Diet Order  Diet regular Room service appropriate? Yes; Fluid consistency: Thin  Diet effective now                     Antimicrobial agents: Anti-infectives (From admission, onward)    Start     Dose/Rate Route Frequency Ordered Stop   08/16/23 2300  vancomycin (VANCOCIN) IVPB 1000 mg/200 mL premix  Status:   Discontinued        1,000 mg 200 mL/hr over 60 Minutes Intravenous Every 24 hours 08/16/23 0232 08/17/23 1532   08/16/23 1000  ceFEPIme (MAXIPIME) 2 g in sodium chloride 0.9 % 100 mL IVPB  Status:  Discontinued        2 g 200 mL/hr over 30 Minutes Intravenous Every 12 hours 08/16/23 0232 08/18/23 1159   08/16/23 0100  oseltamivir (TAMIFLU) capsule 30 mg        30 mg Oral 2 times daily 08/16/23 0050 08/20/23 2159   08/15/23 2300  vancomycin (VANCOCIN) IVPB 1000 mg/200 mL premix  Status:  Discontinued        1,000 mg 200 mL/hr over 60 Minutes Intravenous  Once 08/15/23 2246 08/15/23 2248   08/15/23 2300  ceFEPIme (MAXIPIME) 2 g in sodium chloride 0.9 % 100 mL IVPB        2 g 200 mL/hr over 30 Minutes Intravenous  Once 08/15/23 2246 08/15/23 2351   08/15/23 2300  vancomycin (VANCOREADY) IVPB 2000 mg/400 mL        2,000 mg 200 mL/hr over 120 Minutes Intravenous  Once 08/15/23 2248 08/16/23 0159        MEDICATIONS: Scheduled Meds:  amLODipine  5 mg Oral Daily   divalproex  500 mg Oral BID   enoxaparin (LOVENOX) injection  40 mg Subcutaneous Q24H   ipratropium-albuterol  3 mL Nebulization TID   metoprolol succinate  50 mg Oral Daily   oseltamivir  30 mg Oral BID   Continuous Infusions:   PRN Meds:.acetaminophen **OR** acetaminophen, albuterol, benzonatate, ondansetron **OR** ondansetron (ZOFRAN) IV, oxyCODONE   I have personally reviewed following labs and imaging studies  LABORATORY DATA: CBC: Recent Labs  Lab 08/15/23 2253 08/15/23 2310 08/16/23 0408 08/17/23 1412 08/18/23 0500  WBC 9.0  --  6.5 7.4 5.3  NEUTROABS 7.2  --   --   --   --   HGB 13.3 14.3 11.8* 13.2 13.8  HCT 40.8 42.0 38.1 40.5 40.8  MCV 88.9  --  93.8 88.4 85.7  PLT 262  --  211 266 274    Basic Metabolic Panel: Recent Labs  Lab 08/15/23 2253 08/15/23 2310 08/16/23 0408 08/17/23 1216 08/18/23 0500  NA 136 136 135 137 135  K 4.1 4.0 3.8 5.5* 4.5  CL 99 101 104 102 98  CO2 23  --  17* 24 29   GLUCOSE 207* 208* 202* 79 100*  BUN 13 16 13 18 16   CREATININE 1.30* 1.30* 1.22* 1.08* 1.16*  CALCIUM 9.3  --  8.5* 9.3 9.2  MG  --   --   --  2.1 1.9  PHOS  --   --   --  3.0 3.2    GFR: Estimated Creatinine Clearance: 48.7 mL/min (A) (by C-G formula based on SCr of 1.16 mg/dL (H)).  Liver Function Tests: Recent Labs  Lab 08/15/23 2253  AST 25  ALT 20  ALKPHOS 36*  BILITOT 0.6  PROT 7.1  ALBUMIN 3.8   No results for input(s): "LIPASE", "AMYLASE" in the last 168 hours. No  results for input(s): "AMMONIA" in the last 168 hours.  Coagulation Profile: Recent Labs  Lab 08/15/23 2253  INR 1.1    Cardiac Enzymes: No results for input(s): "CKTOTAL", "CKMB", "CKMBINDEX", "TROPONINI" in the last 168 hours.  BNP (last 3 results) No results for input(s): "PROBNP" in the last 8760 hours.  Lipid Profile: No results for input(s): "CHOL", "HDL", "LDLCALC", "TRIG", "CHOLHDL", "LDLDIRECT" in the last 72 hours.  Thyroid Function Tests: No results for input(s): "TSH", "T4TOTAL", "FREET4", "T3FREE", "THYROIDAB" in the last 72 hours.  Anemia Panel: No results for input(s): "VITAMINB12", "FOLATE", "FERRITIN", "TIBC", "IRON", "RETICCTPCT" in the last 72 hours.  Urine analysis:    Component Value Date/Time   COLORURINE YELLOW 08/16/2023 0040   APPEARANCEUR CLEAR 08/16/2023 0040   APPEARANCEUR Clear 06/07/2023 1335   LABSPEC 1.012 08/16/2023 0040   PHURINE 7.0 08/16/2023 0040   GLUCOSEU NEGATIVE 08/16/2023 0040   GLUCOSEU NEGATIVE 07/16/2022 1459   HGBUR NEGATIVE 08/16/2023 0040   BILIRUBINUR NEGATIVE 08/16/2023 0040   BILIRUBINUR Negative 06/07/2023 1335   KETONESUR NEGATIVE 08/16/2023 0040   PROTEINUR NEGATIVE 08/16/2023 0040   UROBILINOGEN 0.2 02/11/2023 1440   UROBILINOGEN 0.2 07/16/2022 1459   NITRITE NEGATIVE 08/16/2023 0040   LEUKOCYTESUR NEGATIVE 08/16/2023 0040    Sepsis Labs: Lactic Acid, Venous    Component Value Date/Time   LATICACIDVEN 1.4 08/17/2023 1412     MICROBIOLOGY: Recent Results (from the past 240 hours)  Blood Culture (routine x 2)     Status: None (Preliminary result)   Collection Time: 08/15/23 10:51 PM   Specimen: BLOOD  Result Value Ref Range Status   Specimen Description BLOOD BLOOD RIGHT HAND  Final   Special Requests   Final    BOTTLES DRAWN AEROBIC ONLY Blood Culture results may not be optimal due to an inadequate volume of blood received in culture bottles   Culture   Final    NO GROWTH 4 DAYS Performed at [facility], [address], [place], [place] [address]    Report Status PENDING  Incomplete  Resp panel by RT-PCR (RSV, Flu A&B, Covid) Anterior Nasal Swab     Status: Abnormal   Collection Time: 08/15/23 10:53 PM   Specimen: Anterior Nasal Swab  Result Value Ref Range Status   SARS Coronavirus 2 by RT PCR NEGATIVE NEGATIVE Final   Influenza A by PCR POSITIVE (A) NEGATIVE Final   Influenza B by PCR NEGATIVE NEGATIVE Final    Comment: (NOTE) The Xpert Xpress SARS-CoV-2/FLU/RSV plus assay is intended as an aid in the diagnosis of influenza from Nasopharyngeal swab specimens and should not be used as a sole basis for treatment. Nasal washings and aspirates are unacceptable for Xpert Xpress SARS-CoV-2/FLU/RSV testing.  Fact Sheet for Patients: BloggerCourse.com  Fact Sheet for Healthcare Providers: SeriousBroker.it  This test is not yet approved or cleared by the [place] FDA and has been authorized for detection and/or diagnosis of SARS-CoV-2 by FDA under an Emergency Use Authorization (EUA). This EUA will remain in effect (meaning this test can be used) for the duration of the COVID-19 declaration under Section 564(b)(1) of the Act, 21 U.S.C. section 360bbb-3(b)(1), unless the authorization is terminated or revoked.     Resp Syncytial Virus by PCR NEGATIVE NEGATIVE Final    Comment: (NOTE) Fact Sheet for  Patients: BloggerCourse.com  Fact Sheet for Healthcare Providers: SeriousBroker.it  This test is not yet approved or cleared by the [place] FDA and has been authorized for detection and/or diagnosis of  SARS-CoV-2 by FDA under an Emergency Use Authorization (EUA). This EUA will remain in effect (meaning this test can be used) for the duration of the COVID-19 declaration under Section 564(b)(1) of the Act, 21 U.S.C. section 360bbb-3(b)(1), unless the authorization is terminated or revoked.  Performed at [facility], [address] [place], [place] [address]   Blood Culture (routine x 2)     Status: Abnormal   Collection Time: 08/15/23 10:53 PM   Specimen: BLOOD  Result Value Ref Range Status   Specimen Description BLOOD RIGHT ANTECUBITAL  Final   Special Requests   Final    BOTTLES DRAWN AEROBIC AND ANAEROBIC Blood Culture results may not be optimal due to an inadequate volume of blood received in culture bottles   Culture  Setup Time   Final    GRAM POSITIVE COCCI IN CLUSTERS IN BOTH AEROBIC AND ANAEROBIC BOTTLES CRITICAL RESULT CALLED TO, READ BACK BY AND VERIFIED WITH: PHARMD L. CHEN [id number] @ 1649 FH    Culture (A)  Final    STAPHYLOCOCCUS EPIDERMIDIS STAPHYLOCOCCUS HOMINIS THE SIGNIFICANCE OF ISOLATING THIS ORGANISM FROM A SINGLE SET OF BLOOD CULTURES WHEN MULTIPLE SETS ARE DRAWN IS UNCERTAIN. PLEASE NOTIFY THE MICROBIOLOGY DEPARTMENT WITHIN ONE WEEK IF SPECIATION AND SENSITIVITIES ARE REQUIRED. Performed at [facility], [address], [place], [place] [address]    Report Status 08/18/2023 FINAL  Final  Blood Culture ID Panel (Reflexed)     Status: Abnormal   Collection Time: 08/15/23 10:53 PM  Result Value Ref Range Status   Enterococcus faecalis NOT DETECTED NOT DETECTED Final   Enterococcus Faecium NOT DETECTED NOT DETECTED Final   Listeria monocytogenes NOT DETECTED NOT DETECTED Final    Staphylococcus species DETECTED (A) NOT DETECTED Final    Comment: CRITICAL RESULT CALLED TO, READ BACK BY AND VERIFIED WITH: PHARMD L. CHEN [id number] @ 1649 FH    Staphylococcus aureus (BCID) NOT DETECTED NOT DETECTED Final   Staphylococcus epidermidis DETECTED (A) NOT DETECTED Final    Comment: Methicillin (oxacillin) resistant coagulase negative staphylococcus. Possible blood culture contaminant (unless isolated from more than one blood culture draw or clinical case suggests pathogenicity). No antibiotic treatment is indicated for blood  culture contaminants. CRITICAL RESULT CALLED TO, READ BACK BY AND VERIFIED WITH: PHARMD L. CHEN [id number] @ 1649 FH    Staphylococcus lugdunensis NOT DETECTED NOT DETECTED Final   Streptococcus species NOT DETECTED NOT DETECTED Final   Streptococcus agalactiae NOT DETECTED NOT DETECTED Final   Streptococcus pneumoniae NOT DETECTED NOT DETECTED Final   Streptococcus pyogenes NOT DETECTED NOT DETECTED Final   A.calcoaceticus-baumannii NOT DETECTED NOT DETECTED Final   Bacteroides fragilis NOT DETECTED NOT DETECTED Final   Enterobacterales NOT DETECTED NOT DETECTED Final   Enterobacter cloacae complex NOT DETECTED NOT DETECTED Final   Escherichia coli NOT DETECTED NOT DETECTED Final   Klebsiella aerogenes NOT DETECTED NOT DETECTED Final   Klebsiella oxytoca NOT DETECTED NOT DETECTED Final   Klebsiella pneumoniae NOT DETECTED NOT DETECTED Final   Proteus species NOT DETECTED NOT DETECTED Final   Salmonella species NOT DETECTED NOT DETECTED Final   Serratia marcescens NOT DETECTED NOT DETECTED Final   Haemophilus influenzae NOT DETECTED NOT DETECTED Final   Neisseria meningitidis NOT DETECTED NOT DETECTED Final   Pseudomonas aeruginosa NOT DETECTED NOT DETECTED Final   Stenotrophomonas maltophilia NOT DETECTED NOT DETECTED Final   Candida albicans NOT DETECTED NOT DETECTED Final   Candida auris NOT DETECTED NOT DETECTED Final   Candida  glabrata NOT DETECTED  NOT DETECTED Final   Candida krusei NOT DETECTED NOT DETECTED Final   Candida parapsilosis NOT DETECTED NOT DETECTED Final   Candida tropicalis NOT DETECTED NOT DETECTED Final   Cryptococcus neoformans/gattii NOT DETECTED NOT DETECTED Final   Methicillin resistance mecA/C DETECTED (A) NOT DETECTED Final    Comment: CRITICAL RESULT CALLED TO, READ BACK BY AND VERIFIED WITH: PHARMD L. Imogene Burn [id number] @ [phone] FH Performed at [facility], [address] [place], [place] [address]   MRSA Next Gen by PCR, Nasal     Status: None   Collection Time: 08/16/23  2:14 AM   Specimen: Nasal Mucosa; Nasal Swab  Result Value Ref Range Status   MRSA by PCR Next Gen NOT DETECTED NOT DETECTED Final    Comment: (NOTE) The GeneXpert MRSA Assay (FDA approved for NASAL specimens only), is one component of a comprehensive MRSA colonization surveillance program. It is not intended to diagnose MRSA infection nor to guide or monitor treatment for MRSA infections. Test performance is not FDA approved in patients less than 6 years old. Performed at [facility], [address] [place], [place] [address]   Respiratory (~20 pathogens) panel by PCR     Status: Abnormal   Collection Time: 08/18/23  2:25 PM   Specimen: Nasopharyngeal Swab; Respiratory  Result Value Ref Range Status   Adenovirus NOT DETECTED NOT DETECTED Final   Coronavirus 229E NOT DETECTED NOT DETECTED Final    Comment: (NOTE) The Coronavirus on the Respiratory Panel, DOES NOT test for the novel  Coronavirus (2019 nCoV)    Coronavirus HKU1 NOT DETECTED NOT DETECTED Final   Coronavirus NL63 NOT DETECTED NOT DETECTED Final   Coronavirus OC43 NOT DETECTED NOT DETECTED Final   Metapneumovirus NOT DETECTED NOT DETECTED Final   Rhinovirus / Enterovirus NOT DETECTED NOT DETECTED Final   Influenza A H1 2009 DETECTED (A) NOT DETECTED Final   Influenza B NOT DETECTED NOT DETECTED Final   Parainfluenza Virus 1 NOT DETECTED NOT DETECTED Final    Parainfluenza Virus 2 NOT DETECTED NOT DETECTED Final   Parainfluenza Virus 3 NOT DETECTED NOT DETECTED Final   Parainfluenza Virus 4 NOT DETECTED NOT DETECTED Final   Respiratory Syncytial Virus NOT DETECTED NOT DETECTED Final   Bordetella pertussis NOT DETECTED NOT DETECTED Final   Bordetella Parapertussis NOT DETECTED NOT DETECTED Final   Chlamydophila pneumoniae NOT DETECTED NOT DETECTED Final   Mycoplasma pneumoniae NOT DETECTED NOT DETECTED Final    Comment: Performed at [facility], [address], [place], [place] [address]    RADIOLOGY STUDIES/RESULTS: No results found.   LOS: 3 days   Jeoffrey Massed, MD  [facility]    To contact the attending provider between 7A-7P or the covering provider during after hours 7P-7A, please log into the web site [email or web address] and access using universal [place]andra password for that web site. If you do not have the password, please call the hospital operator.  08/19/2023, 11:53 AM

## 2023-08-19 NOTE — Progress Notes (Signed)
Mobility Specialist Progress Note:    08/19/23 1229  Mobility  Activity Transferred from bed to chair  Level of Assistance Moderate assist, patient does 50-74%  Assistive Device Front wheel walker  Distance Ambulated (ft) 3 ft  Activity Response Tolerated well  Mobility Referral Yes  Mobility visit 1 Mobility  Mobility Specialist Start Time (ACUTE ONLY) 1010  Mobility Specialist Stop Time (ACUTE ONLY) 1028  Mobility Specialist Time Calculation (min) (ACUTE ONLY) 18 min   Pt received in bed agreeable to mobility. Pt required ModA w/ bed mobility and STS. Had a strong posterior lean during session. Upon standing pt had a BM. Assisted w/ pericare. Took a couple steps towards the chair w/o fault. Call bell and personal belongings in reach. All needs met.NT aware.  Thompson Grayer Mobility Specialist  Please contact vis Secure Chat or  Rehab Office [phone]

## 2023-08-19 NOTE — Evaluation (Signed)
Occupational Therapy Evaluation Patient Details Name: Shelby Jordan MRN: [id number] DOB: [birth year] Today's Date: 08/19/2023   History of Present Illness Pt is a 73 y/o F admitted on 08/15/23 after presenting with c/o AMS & progressively worsening SOB, as well as fever & chills x 2 days. Pt tested positive for Influenza A. Pt recently d/c from hospital on 08/12/23 after being treated for breakthrough seizures. PMH: bipolar, depression, HTN, seizures, HLD, CKD 3   Clinical Impression   Pt admitted for above, presents with impaired processing and sequencing. Pt would take a couple of steps, freeze, then proceed to take more shuffle steps with frequent cues and forward facilitation of RW to take steps. Pt aware that this is not her baseline, had meds recently but unsure if that impacted her cognition. Pt BP stable during assessment, currently needs max to setup assist for ADLs. OT to continue following pt acutely to address listed deficits and help transition to next level of care. Patient would benefit from post acute skilled rehab facility with <3 hours of therapy and 24/7 support at this time. Hopeful to progress with improved cognition.       If plan is discharge home, recommend the following: A lot of help with bathing/dressing/bathroom;Assistance with cooking/housework;Direct supervision/assist for medications management;Direct supervision/assist for financial management;Assist for transportation;Help with stairs or ramp for entrance;A little help with walking and/or transfers    Functional Status Assessment  Patient has had a recent decline in their functional status and demonstrates the ability to make significant improvements in function in a reasonable and predictable amount of time.  Equipment Recommendations   (pt has rec DME)    Recommendations for Other Services       Precautions / Restrictions Precautions Precautions: Fall Precaution Comments: seizures Restrictions Weight  Bearing Restrictions Per Provider Order: No      Mobility Bed Mobility               General bed mobility comments: pt in recliner on arrival, left sitting in recliner    Transfers Overall transfer level: Needs assistance Equipment used: Rolling walker (2 wheels) Transfers: Sit to/from Stand Sit to Stand: Contact guard assist           General transfer comment: cues for hand placement      Balance Overall balance assessment: Needs assistance Sitting-balance support: Feet supported, No upper extremity supported Sitting balance-Leahy Scale: Fair Sitting balance - Comments: in recliner, not fully assessed   Standing balance support: During functional activity, Bilateral upper extremity supported, Reliant on assistive device for balance Standing balance-Leahy Scale: Poor Standing balance comment: with RW support                           ADL either performed or assessed with clinical judgement   ADL Overall ADL's : Needs assistance/impaired Eating/Feeding: Sitting;Set up   Grooming: Sitting;Set up   Upper Body Bathing: Set up;Sitting;Supervision/ safety   Lower Body Bathing: Minimal assistance;Sitting/lateral leans   Upper Body Dressing : Minimal assistance;Sitting   Lower Body Dressing: Sitting/lateral leans;Maximal assistance   Toilet Transfer: Minimal assistance;Rolling walker (2 wheels);Ambulation Toilet Transfer Details (indicate cue type and reason): Min A for ambulation Toileting- Clothing Manipulation and Hygiene: Moderate assistance;Sit to/from stand       Functional mobility during ADLs: Minimal assistance;Moderate assistance;Rolling walker (2 wheels) General ADL Comments: pt overall ambulatory with min A, initially was CGA but processing was very slow she would take a couple of shuffle  steps then stop then proceed to take more steps then stop. This behavior was repeated throughout OOB mobility and did not improve, a bit increased support  needed with onset of dizziness leading to intermittent Mod A. Mod A once close to recliner due to posterior bias     Vision         Perception         Praxis         Pertinent Vitals/Pain Pain Assessment Pain Assessment: No/denies pain     Extremity/Trunk Assessment Upper Extremity Assessment Upper Extremity Assessment: Generalized weakness   Lower Extremity Assessment Lower Extremity Assessment: Generalized weakness       Communication Communication Communication: No apparent difficulties Cueing Techniques: Verbal cues;Gestural cues;Tactile cues   Cognition Arousal: Alert Behavior During Therapy: Flat affect Overall Cognitive Status: Impaired/Different from baseline Area of Impairment: Following commands, Awareness, Attention, Problem solving                   Current Attention Level: Sustained   Following Commands: Follows one step commands with increased time     Problem Solving: Slow processing, Difficulty sequencing, Decreased initiation General Comments: Pt very slow to process commands and ambulation, she is aware that something is "off" needing several cues and some assist to take steps     General Comments  VSS on 1L    Exercises     Shoulder Instructions      Home Living Family/patient expects to be discharged to:: Private residence Living Arrangements: Children Available Help at Discharge: Family;Available PRN/intermittently (family works during the day, pt home alone) Type of Home: House Home Access: Stairs to enter Secretary/administrator of Steps: 1 Entrance Stairs-Rails: None Home Layout: Two level;1/2 bath on main level;Bed/bath upstairs Alternate Level Stairs-Number of Steps: flight Alternate Level Stairs-Rails: Left Bathroom Shower/Tub: Chief Strategy Officer: Standard Bathroom Accessibility: Yes How Accessible: Accessible via walker Home Equipment: Shower seat          Prior Functioning/Environment Prior  Level of Function : Needs assist             Mobility Comments: Pt reports prior to first hospital admission she was ambulatory without AD but has been using a walker since last admission. ADLs Comments: assist for transportation, medications, IADLs; family assists with bathing and dressing due to "memory" problems        OT Problem List: Decreased strength;Impaired balance (sitting and/or standing);Decreased cognition      OT Treatment/Interventions: Self-care/ADL training;Therapeutic exercise;DME and/or AE instruction;Therapeutic activities;Cognitive remediation/compensation;Patient/family education;Balance training    OT Goals(Current goals can be found in the care plan section) Acute Rehab OT Goals Patient Stated Goal: To get better OT Goal Formulation: With patient Time For Goal Achievement: 09/02/23 Potential to Achieve Goals: Good ADL Goals Pt Will Perform Grooming: standing;with modified independence Pt Will Perform Lower Body Dressing: with modified independence;sit to/from stand Pt Will Transfer to Toilet: with modified independence;ambulating Pt Will Perform Toileting - Clothing Manipulation and hygiene: with modified independence;sitting/lateral leans Pt Will Perform Tub/Shower Transfer: with supervision;ambulating;shower seat  OT Frequency: Min 1X/week    Co-evaluation              AM-PAC OT "6 Clicks" Daily Activity     Outcome Measure Help from another person eating meals?: A Little Help from another person taking care of personal grooming?: A Little Help from another person toileting, which includes using toliet, bedpan, or urinal?: A Lot Help from another person bathing (including washing,  rinsing, drying)?: A Little Help from another person to put on and taking off regular upper body clothing?: A Little Help from another person to put on and taking off regular lower body clothing?: A Lot 6 Click Score: 16   End of Session Equipment Utilized During  Treatment: Gait belt;Rolling walker (2 wheels) Nurse Communication: Mobility status  Activity Tolerance: Patient tolerated treatment well Patient left: in chair;with call bell/phone within reach;with chair alarm set  OT Visit Diagnosis: Unsteadiness on feet (R26.81);Other symptoms and signs involving cognitive function                Time: 1610-9604 OT Time Calculation (min): 27 min Charges:  OT General Charges $OT Visit: 1 Visit OT Evaluation $OT Eval Low Complexity: 1 Low OT Treatments $Therapeutic Activity: 8-22 mins  08/19/2023  AB, OTR/L  Acute Rehabilitation Services  Office: [phone]   Tristan Schroeder 08/19/2023, 3:53 PM

## 2023-08-20 ENCOUNTER — Other Ambulatory Visit (HOSPITAL_COMMUNITY): Payer: Self-pay

## 2023-08-20 DIAGNOSIS — J189 Pneumonia, unspecified organism: Secondary | ICD-10-CM | POA: Diagnosis not present

## 2023-08-20 DIAGNOSIS — A419 Sepsis, unspecified organism: Secondary | ICD-10-CM | POA: Diagnosis not present

## 2023-08-20 DIAGNOSIS — J4540 Moderate persistent asthma, uncomplicated: Secondary | ICD-10-CM | POA: Diagnosis not present

## 2023-08-20 DIAGNOSIS — J101 Influenza due to other identified influenza virus with other respiratory manifestations: Secondary | ICD-10-CM | POA: Diagnosis not present

## 2023-08-20 LAB — CULTURE, BLOOD (ROUTINE X 2): Culture: NO GROWTH

## 2023-08-20 MED ORDER — ALBUTEROL SULFATE HFA 108 (90 BASE) MCG/ACT IN AERS
2.0000 | INHALATION_SPRAY | RESPIRATORY_TRACT | 1 refills | Status: DC | PRN
  Filled 2023-08-20: qty 18, 30d supply, fill #0

## 2023-08-20 MED ORDER — MOMETASONE FURO-FORMOTEROL FUM 100-5 MCG/ACT IN AERO
2.0000 | INHALATION_SPRAY | Freq: Two times a day (BID) | RESPIRATORY_TRACT | Status: DC
  Administered 2023-08-20: 2 via RESPIRATORY_TRACT
  Filled 2023-08-20: qty 8.8

## 2023-08-20 MED ORDER — GUAIFENESIN ER 600 MG PO TB12
600.0000 mg | ORAL_TABLET | Freq: Two times a day (BID) | ORAL | 0 refills | Status: DC
  Filled 2023-08-20: qty 14, 7d supply, fill #0

## 2023-08-20 MED ORDER — FLUTICASONE-SALMETEROL 100-50 MCG/ACT IN AEPB
1.0000 | INHALATION_SPRAY | Freq: Two times a day (BID) | RESPIRATORY_TRACT | 1 refills | Status: AC
  Filled 2023-08-20: qty 60, 30d supply, fill #0

## 2023-08-20 MED ORDER — TRAZODONE HCL 50 MG PO TABS
50.0000 mg | ORAL_TABLET | Freq: Every evening | ORAL | 0 refills | Status: DC | PRN
  Filled 2023-08-20: qty 30, 30d supply, fill #0

## 2023-08-20 MED ORDER — BENZONATATE 200 MG PO CAPS
200.0000 mg | ORAL_CAPSULE | Freq: Three times a day (TID) | ORAL | 0 refills | Status: DC | PRN
  Filled 2023-08-20: qty 20, 7d supply, fill #0

## 2023-08-20 NOTE — Progress Notes (Signed)
   08/20/23 0934  Mobility  Activity Transferred from bed to chair  Level of Assistance Modified independent, requires aide device or extra time  Assistive Device Front wheel walker  Activity Response Tolerated well  Mobility Referral Yes  Mobility visit 1 Mobility  Mobility Specialist Start Time (ACUTE ONLY) 0915  Mobility Specialist Stop Time (ACUTE ONLY) 0934  Mobility Specialist Time Calculation (min) (ACUTE ONLY) 19 min   Mobility Specialist: Progress Note  Pre-Mobility:      HR 78, SpO2 100% RA Post-Mobility:    HR 100%, SpO2 100% 1LO2  Pt agreeable to mobility session - received in bed with nasal cannula off, VSS. Pt with SOB post mobility. Placed on Nasal Cannula. Returned to chair with all needs met - call bell within reach. Chair alarm on.   Barnie Mort, BS Mobility Specialist Please contact via SecureChat or  Rehab office at [phone].

## 2023-08-20 NOTE — Plan of Care (Signed)
  Problem: Education: Goal: Knowledge of General Education information will improve Description: Including pain rating scale, medication(s)/side effects and non-pharmacologic comfort measures Outcome: Progressing   Problem: Health Behavior/Discharge Planning: Goal: Ability to manage health-related needs will improve Outcome: Progressing   Problem: Clinical Measurements: Goal: Ability to maintain clinical measurements within normal limits will improve Outcome: Progressing Goal: Will remain free from infection Outcome: Progressing Goal: Diagnostic test results will improve Outcome: Progressing Goal: Respiratory complications will improve Outcome: Progressing   Problem: Activity: Goal: Risk for activity intolerance will decrease Outcome: Progressing   Problem: Elimination: Goal: Will not experience complications related to bowel motility Outcome: Progressing   Problem: Safety: Goal: Ability to remain free from injury will improve Outcome: Progressing

## 2023-08-20 NOTE — Progress Notes (Signed)
Spoke to patients DTR. [name] who stated she has the flu; Also stated she was working with the social worker to arrange transportation for patient.   Sent message to Child psychotherapist; waiting for response.   Larita Fife Norris,Social Worker stated patient will go home via [place].

## 2023-08-20 NOTE — Progress Notes (Signed)
SATURATION QUALIFICATIONS: (This note is used to comply with regulatory documentation for home oxygen)  Patient Saturations on Room Air at Rest = 90-92%  Patient Saturations on Room Air while Ambulating = 92-94%

## 2023-08-20 NOTE — Discharge Summary (Signed)
PATIENT DETAILS Name: Shelby Jordan Age: 73 y.o. Sex: female Date of Birth: 01/17/1951 MRN: [id number]. Admitting Physician: Alan Mulder, MD EXB:[id number], Wyatt Mage, FNP  Admit Date: 08/15/2023 Discharge date: 08/20/2023  Recommendations for Outpatient Follow-up:  Follow up with PCP in 1-2 weeks Please obtain CMP/CBC in one week Please ensure follow-up with psychiatry  Admitted From:  SNF  Disposition: Home health (patient/daughter refused SNF)   Discharge Condition: good  CODE STATUS:   Code Status: Full Code   Diet recommendation:  Diet Order             Diet - low sodium heart healthy           Diet regular Room service appropriate? Yes; Fluid consistency: Thin  Diet effective now                    Brief Summary: Patient is a 73 y.o.  female with a history of HTN, HLD, COPD,bipolar disorder, seizure disorder-who was hospitalized from 1/19-1/23-for breakthrough seizures/unsteady gait-discharged to SNF-readmitted on 1/27 for shortness of breath-found to have influenza A infection.   Significant events: 1/19-1/23>> hospitalization for breakthrough seizures/unsteady gait-DC to SNF 1/27>> admit to [facility]-respiratory failure secondary to influenza infection.   Significant studies: 1/26>> CXR: No PNA 1/27>> CT chest/abdomen/pelvis: No acute findings.   Significant microbiology data: 1/26>> influenza A PCR: Positive 1/26>> COVID/influenza B/RSV PCR: Negative 1/26>> blood culture: Staph epidermidis/Staph hominis (contamination)   Procedures: None   Consults: None  Brief Hospital Course: Sepsis secondary to influenza A infection (POA) Sepsis physiology has resolved  Tamiflu x 5 days complete on 1/30   Acute hypoxic respiratory failure-likely due to acute bronchitis in the setting of influenza A infection Overall improved Titrated to room air today Oxygen levels continue to fluctuate-has poor waveform-I have asked nursing staff to do ambulatory O2 sat  check-if she qualifies for home O2-this will be ordered-an addendum will be made to this summary.  Note has a history of underlying COPD.  COPD Resume inhaler regimen Probably may require home O2-see above   CKD stage IIIa At baseline   HTN BP stable Amlodipine/metoprolol   Bipolar disorder Continue Depakote Discussed with daughter on 1/30-she reports that she stopped Zyprexa-she was concerned about development of extrapyramidal symptoms/Parkinson's disease.  She has an appointment with her psychiatrist on 2/7-hold Zyprexa until she sees her primary psychiatrist.   Seizures Depakote   Debility/deconditioning PT OT eval-SNF recommended-however patient/daughter declining SNF-and want to go home with home health.   Class 1 Obesity: Estimated body mass index is 35.57 kg/m as calculated from the following:   Height as of this encounter: 5\' 4"  (1.626 m).   Weight as of this encounter: 94 kg   Discharge Diagnoses:  Principal Problem:   Nosocomial pneumonia   Discharge Instructions:  Activity:  As tolerated Discharge Instructions     Call MD for:  difficulty breathing, headache or visual disturbances   Complete by: As directed    Diet - low sodium heart healthy   Complete by: As directed    Discharge instructions   Complete by: As directed    Follow with Primary MD  Mort Sawyers, FNP in 1-2 weeks  Please follow-up with psychiatry as previously scheduled  Please get a complete blood count and chemistry panel checked by your Primary MD at your next visit, and again as instructed by your Primary MD.  Get Medicines reviewed and adjusted: Please take all your medications with you for your next visit with your  Primary MD  Laboratory/radiological data: Please request your Primary MD to go over all hospital tests and procedure/radiological results at the follow up, please ask your Primary MD to get all Hospital records sent to his/her office.  In some cases, they will be  blood work, cultures and biopsy results pending at the time of your discharge. Please request that your primary care M.D. follows up on these results.  Also Note the following: If you experience worsening of your admission symptoms, develop shortness of breath, life threatening emergency, suicidal or homicidal thoughts you must seek medical attention immediately by calling 911 or calling your MD immediately  if symptoms less severe.  You must read complete instructions/literature along with all the possible adverse reactions/side effects for all the Medicines you take and that have been prescribed to you. Take any new Medicines after you have completely understood and accpet all the possible adverse reactions/side effects.   Do not drive when taking Pain medications or sleeping medications (Benzodaizepines)  Do not take more than prescribed Pain, Sleep and Anxiety Medications. It is not advisable to combine anxiety,sleep and pain medications without talking with your primary care practitioner  Special Instructions: If you have smoked or chewed Tobacco  in the last 2 yrs please stop smoking, stop any regular Alcohol  and or any Recreational drug use.  Wear Seat belts while driving.  Please note: You were cared for by a hospitalist during your hospital stay. Once you are discharged, your primary care physician will handle any further medical issues. Please note that NO REFILLS for any discharge medications will be authorized once you are discharged, as it is imperative that you return to your primary care physician (or establish a relationship with a primary care physician if you do not have one) for your post hospital discharge needs so that they can reassess your need for medications and monitor your lab values.   Increase activity slowly   Complete by: As directed       Allergies as of 08/20/2023       Reactions   Olanzapine Other (See Comments)   Parkinson-like symptoms   Tomato  (diagnostic) Other (See Comments)   Tongue break out.    Citrus Rash        Medication List     STOP taking these medications    Gemtesa 75 MG Tabs Generic drug: Vibegron   potassium chloride 10 MEQ tablet Commonly known as: KLOR-CON M       TAKE these medications    albuterol 108 (90 Base) MCG/ACT inhaler Commonly known as: VENTOLIN HFA Inhale 2 puffs into the lungs every 4 (four) hours as needed for shortness of breath.   amLODipine 5 MG tablet Commonly known as: NORVASC Take 1 tablet (5 mg total) by mouth daily.   benzonatate 200 MG capsule Commonly known as: TESSALON Take 1 capsule (200 mg total) by mouth 3 (three) times daily as needed for cough.   divalproex 250 MG DR tablet Commonly known as: Depakote Take 2 tablets (500 mg total) by mouth 2 (two) times daily.   fluticasone-salmeterol 100-50 MCG/ACT Aepb Commonly known as: Wixela Inhub Inhale 1 puff into the lungs 2 (two) times daily. What changed: See the new instructions.   guaiFENesin 600 MG 12 hr tablet Commonly known as: MUCINEX Take 1 tablet (600 mg total) by mouth 2 (two) times daily.   loperamide 2 MG capsule Commonly known as: IMODIUM Take 1 capsule (2 mg total) by mouth as needed for  diarrhea or loose stools.   magnesium gluconate 500 MG tablet Commonly known as: MAGONATE Take 500 mg by mouth at bedtime.   metoprolol succinate 50 MG 24 hr tablet Commonly known as: TOPROL-XL TAKE 1 TABLET BY MOUTH EVERY DAY   traZODone 50 MG tablet Commonly known as: DESYREL Take 1 tablet (50 mg total) by mouth at bedtime as needed for sleep.        Follow-up Information     Mort Sawyers, FNP. Schedule an appointment as soon as possible for a visit in 1 week(s).   Specialty: Family Medicine Contact information: [address] [place] [address] [phone]                Allergies  Allergen Reactions   Olanzapine Other (See Comments)    Parkinson-like symptoms    Tomato (Diagnostic) Other (See Comments)    Tongue break out.    Citrus Rash     Other Procedures/Studies: CT CHEST ABDOMEN PELVIS WO CONTRAST Result Date: 08/16/2023 CLINICAL DATA:  73 year old female with history of shortness of breath. Sepsis. EXAM: CT CHEST, ABDOMEN AND PELVIS WITHOUT CONTRAST TECHNIQUE: Multidetector CT imaging of the chest, abdomen and pelvis was performed following the standard protocol without IV contrast. RADIATION DOSE REDUCTION: This exam was performed according to the departmental dose-optimization program which includes automated exposure control, adjustment of the mA and/or kV according to patient size and/or use of iterative reconstruction technique. COMPARISON:  CT of the abdomen and pelvis 04/29/2022. FINDINGS: CT CHEST FINDINGS Cardiovascular: Heart size is normal. There is no significant pericardial fluid, thickening or pericardial calcification. Atherosclerotic calcifications are noted in the thoracic aorta and great vessels. No definite coronary artery calcifications. Mediastinum/Nodes: No pathologically enlarged mediastinal or hilar lymph nodes. Please note that accurate exclusion of hilar adenopathy is limited on noncontrast CT scans. Esophagus is unremarkable in appearance. No axillary lymphadenopathy. Lungs/Pleura: Mild scarring in the lung bases bilaterally. No acute consolidative airspace disease. No pleural effusions. No definite suspicious appearing pulmonary nodules or masses are noted. Musculoskeletal: There are no aggressive appearing lytic or blastic lesions noted in the visualized portions of the skeleton. CT ABDOMEN PELVIS FINDINGS Hepatobiliary: No definite suspicious cystic or solid hepatic lesions are confidently identified on today's noncontrast CT examination. Unenhanced appearance of the gallbladder is unremarkable. Pancreas: No definite pancreatic mass or peripancreatic fluid collections or inflammatory changes are confidently identified on today's  noncontrast CT examination. Spleen: Unremarkable. Adrenals/Urinary Tract: Unenhanced appearance of the kidneys and bilateral adrenal glands is normal. No hydroureteronephrosis. Urinary bladder is unremarkable in appearance. Stomach/Bowel: Unenhanced appearance of the stomach is unremarkable. No pathologic dilatation of small bowel or colon. Numerous colonic diverticuli are noted, particularly in the sigmoid colon, without surrounding inflammatory changes to indicate an acute diverticulitis at this time. Status post appendectomy. Vascular/Lymphatic: Atherosclerotic calcifications in the abdominal aorta and pelvic vasculature. No lymphadenopathy noted in the abdomen or pelvis. Reproductive: Unenhanced appearance of the uterus and ovaries is unremarkable. Other: No significant volume of ascites.  No pneumoperitoneum. Musculoskeletal: There are no aggressive appearing lytic or blastic lesions noted in the visualized portions of the skeleton. IMPRESSION: 1. No acute findings are noted in the chest, abdomen or pelvis to account for the patient's symptoms. 2. Colonic diverticulosis without evidence of acute diverticulitis at this time. 3. Aortic atherosclerosis. Aortic Atherosclerosis (ICD10-I70.0). Electronically Signed   By: Trudie Reed M.D.   On: 08/16/2023 06:42   DG Chest Port 1 View Result Date: 08/15/2023 CLINICAL DATA:  Shortness  of breath and possible sepsis EXAM: PORTABLE CHEST 1 VIEW COMPARISON:  08/11/2023 FINDINGS: Cardiac shadow is within normal limits. Lungs are well aerated bilaterally. No focal infiltrate or effusion is seen. No bony abnormality is noted. IMPRESSION: No active disease. Electronically Signed   By: Alcide Clever M.D.   On: 08/15/2023 23:02   DG Chest Port 1V same Day Result Date: 08/11/2023 CLINICAL DATA:  Shortness of breath. EXAM: PORTABLE CHEST 1 VIEW COMPARISON:  08/08/2023 FINDINGS: Lungs are adequately inflated without focal airspace consolidation or effusion.  Cardiomediastinal silhouette and remainder of the exam is unchanged. IMPRESSION: No active disease. Electronically Signed   By: Elberta Fortis M.D.   On: 08/11/2023 14:10   MR BRAIN WO CONTRAST Result Date: 08/10/2023 CLINICAL DATA:  Seizure, new onset EXAM: MRI HEAD WITHOUT CONTRAST TECHNIQUE: Multiplanar, multiecho pulse sequences of the brain and surrounding structures were obtained without intravenous contrast. COMPARISON:  Head CT 08/08/2023 FINDINGS: Brain: Diffusion imaging does not show any acute or subacute infarction or other cause of restricted diffusion. No focal abnormality affects the brainstem. 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 Cerebral hemispheres show an old infarction in the left basal ganglia and moderate to pronounced chronic small-vessel ischemic changes of the deep and subcortical white matter. No cortical or large vessel territory infarction. No mass lesion, hemorrhage, hydrocephalus or extra-axial collection. Mesial temporal lobes are symmetric. Vascular: Major vessels at the base of the brain show flow. Skull and upper cervical spine: Negative Sinuses/Orbits: Clear/normal Other: None IMPRESSION: No acute or reversible finding. 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 Moderate to pronounced chronic small-vessel ischemic changes of the deep and subcortical white matter of both hemispheres. Electronically Signed   By: Paulina Fusi M.D.   On: 08/10/2023 15:13   Overnight EEG with video Result Date: 08/09/2023 Charlsie Quest, MD     08/09/2023 10:04 AM Patient Name: Jennell Janosik MRN: [id number] Epilepsy Attending: Charlsie Quest Referring Physician/Provider: Erick Blinks, MD Duration: 08/08/2023 7846 to 08/09/2023 9629 Patient history: 73 y.o. female with hx of depression, Bipolar 1, Hep C, HTN, who presents with multiple episodes concerning for seizures after running out of Depakote a month ago which she  uses for mood. EEG to evaluate for seizure Level of alertness: Awake, asleep AEDs during EEG study: VPA Technical aspects: This EEG study was done with scalp electrodes positioned according to the 10-20 International system of electrode placement. Electrical activity was reviewed with band pass filter of 1-70Hz , sensitivity of 7 uV/mm, display speed of 7mm/sec with a 60Hz  notched filter applied as appropriate. EEG data were recorded continuously and digitally stored.  Video monitoring was available and reviewed as appropriate. Description: The posterior dominant rhythm consists of 8-9 Hz activity of moderate voltage (25-35 uV) seen predominantly in posterior head regions, symmetric and reactive to eye opening and eye closing. Sleep was characterized by vertex waves, sleep spindles (12 to 14 Hz), maximal frontocentral region.  Hyperventilation and photic stimulation were not performed.   IMPRESSION: This study is within normal limits. No seizures or epileptiform discharges were seen throughout the recording. A normal interictal EEG does not exclude the diagnosis of epilepsy. Charlsie Quest   DG CHEST PORT 1 VIEW Result Date: 08/08/2023 CLINICAL DATA:  73 year old female with seizure-like activity. EXAM: PORTABLE CHEST 1 VIEW COMPARISON:  Chest x-ray 04/15/2023. FINDINGS: Lung volumes are normal. No consolidative airspace disease. Diffuse interstitial prominence and widespread peribronchial cuffing. No pleural effusions.  No pneumothorax. No evidence of pulmonary edema. Heart size is normal. The patient is rotated to the left on today's exam, resulting in distortion of the mediastinal contours and reduced diagnostic sensitivity and specificity for mediastinal pathology. IMPRESSION: 1. The appearance of the chest suggests an acute bronchitis. Electronically Signed   By: Trudie Reed M.D.   On: 08/08/2023 08:25   CT Head Wo Contrast Result Date: 08/08/2023 CLINICAL DATA:  New onset seizure. EXAM: CT HEAD  WITHOUT CONTRAST TECHNIQUE: Contiguous axial images were obtained from the base of the skull through the vertex without intravenous contrast. RADIATION DOSE REDUCTION: This exam was performed according to the departmental dose-optimization program which includes automated exposure control, adjustment of the mA and/or kV according to patient size and/or use of iterative reconstruction technique. COMPARISON:  CT head 08/07/2023 FINDINGS: Brain: No intracranial hemorrhage, mass effect, or evidence of acute infarct. No hydrocephalus. No extra-axial fluid collection. Age-commensurate cerebral atrophy and chronic small vessel ischemic disease. 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 Chronic left cerebellar infarct. Vascular: No hyperdense vessel. Intracranial arterial calcification. Skull: No fracture or focal lesion.  Left forehead hematoma. Sinuses/Orbits: No acute finding. Other: None. IMPRESSION: 1. No acute intracranial abnormality. 2. Left forehead hematoma. No skull fracture. Electronically Signed   By: Minerva Fester M.D.   On: 08/08/2023 03:56   CT HEAD WO CONTRAST ( ) Result Date: 08/07/2023 CLINICAL DATA:  Mechanical fall.  Laceration to middle of forehead. EXAM: CT HEAD WITHOUT CONTRAST CT CERVICAL SPINE WITHOUT CONTRAST TECHNIQUE: Multidetector CT imaging of the head and cervical spine was performed following the standard protocol without intravenous contrast. Multiplanar CT image reconstructions of the cervical spine were also generated. RADIATION DOSE REDUCTION: This exam was performed according to the departmental dose-optimization program which includes automated exposure control, adjustment of the mA and/or kV according to patient size and/or use of iterative reconstruction technique. COMPARISON:  None Available. FINDINGS: CT HEAD FINDINGS Brain: No intracranial hemorrhage, mass effect, or evidence of acute infarct. No hydrocephalus. No extra-axial fluid  collection. Mild cerebral atrophy and chronic small vessel ischemic disease. 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 Vascular: No hyperdense vessel. Intracranial arterial calcification. Skull: No fracture or focal lesion.  Left forehead scalp hematoma. Sinuses/Orbits: No acute finding. Other: None. CT CERVICAL SPINE FINDINGS Alignment: No evidence of traumatic malalignment. Skull base and vertebrae: No acute fracture. No primary bone lesion or focal pathologic process. Soft tissues and spinal canal: No prevertebral fluid or swelling. No visible canal hematoma. Disc levels: Multilevel spondylosis, disc space height loss, and degenerative endplate changes greatest at C5-C6 where it is moderate. No severe spinal canal narrowing. Upper chest: No acute abnormality. Other: None. IMPRESSION: 1. No acute intracranial abnormality. 2. No acute fracture in the cervical spine. Electronically Signed   By: Minerva Fester M.D.   On: 08/07/2023 22:22   CT Cervical Spine Wo Contrast Result Date: 08/07/2023 CLINICAL DATA:  Mechanical fall.  Laceration to middle of forehead. EXAM: CT HEAD WITHOUT CONTRAST CT CERVICAL SPINE WITHOUT CONTRAST TECHNIQUE: Multidetector CT imaging of the head and cervical spine was performed following the standard protocol without intravenous contrast. Multiplanar CT image reconstructions of the cervical spine were also generated. RADIATION DOSE REDUCTION: This exam was performed according to the departmental dose-optimization program which includes automated exposure control, adjustment of the mA and/or kV according to patient size and/or use of iterative reconstruction technique. COMPARISON:  None Available. FINDINGS: CT HEAD FINDINGS Brain: No  intracranial hemorrhage, mass effect, or evidence of acute infarct. No hydrocephalus. No extra-axial fluid collection. Mild cerebral atrophy and chronic small vessel ischemic disease. Chronic infarct or prominent perivascular  space in the left basal ganglia. Vascular: No hyperdense vessel. Intracranial arterial calcification. Skull: No fracture or focal lesion.  Left forehead scalp hematoma. Sinuses/Orbits: No acute finding. Other: None. CT CERVICAL SPINE FINDINGS Alignment: No evidence of traumatic malalignment. Skull base and vertebrae: No acute fracture. No primary bone lesion or focal pathologic process. Soft tissues and spinal canal: No prevertebral fluid or swelling. No visible canal hematoma. Disc levels: Multilevel spondylosis, disc space height loss, and degenerative endplate changes greatest at C5-C6 where it is moderate. No severe spinal canal narrowing. Upper chest: No acute abnormality. Other: None. IMPRESSION: 1. No acute intracranial abnormality. 2. No acute fracture in the cervical spine. Electronically Signed   By: Minerva Fester M.D.   On: 08/07/2023 22:22     TODAY-DAY OF DISCHARGE:  Subjective:   Tene Lohn today has no headache,no chest abdominal pain,no new weakness tingling or numbness, feels much better wants to go home today.   Objective:   Blood pressure (!) 154/76, pulse 71, temperature 97.9 F (36.6 C), temperature source Oral, resp. rate 11, height 5\' 4"  (1.626 m), weight 94 kg, SpO2 98%.  Intake/Output Summary (Last 24 hours) at 08/20/2023 0910 Last data filed at 08/20/2023 0514 Gross per 24 hour  Intake --  Output 500 ml  Net -500 ml   Filed Weights   08/15/23 2240  Weight: 94 kg    Exam: Awake Alert, Oriented *3, No new F.N deficits, Normal affect [place].AT,PERRAL Supple Neck,No JVD, No cervical lymphadenopathy appriciated.  Symmetrical Chest wall movement, Good air movement bilaterally, CTAB RRR,No Gallops,Rubs or new Murmurs, No Parasternal Heave +ve B.Sounds, Abd Soft, Non tender, No organomegaly appriciated, No rebound -guarding or rigidity. No Cyanosis, Clubbing or edema, No new Rash or bruise   PERTINENT RADIOLOGIC STUDIES: No results found.   PERTINENT LAB  RESULTS: CBC: Recent Labs    08/17/23 1412 08/18/23 0500  WBC 7.4 5.3  HGB 13.2 13.8  HCT 40.5 40.8  PLT 266 274   CMET CMP     Component Value Date/Time   NA 135 08/18/2023 0500   K 4.5 08/18/2023 0500   CL 98 08/18/2023 0500   CO2 29 08/18/2023 0500   GLUCOSE 100 (H) 08/18/2023 0500   BUN 16 08/18/2023 0500   CREATININE 1.16 (H) 08/18/2023 0500   CALCIUM 9.2 08/18/2023 0500   PROT 7.1 08/15/2023 2253   ALBUMIN 3.8 08/15/2023 2253   AST 25 08/15/2023 2253   ALT 20 08/15/2023 2253   ALKPHOS 36 (L) 08/15/2023 2253   BILITOT 0.6 08/15/2023 2253   GFR 47.84 (L) 04/15/2023 1252   GFRNONAA 50 (L) 08/18/2023 0500    GFR Estimated Creatinine Clearance: 48.7 mL/min (A) (by C-G formula based on SCr of 1.16 mg/dL (H)). No results for input(s): "LIPASE", "AMYLASE" in the last 72 hours. No results for input(s): "CKTOTAL", "CKMB", "CKMBINDEX", "TROPONINI" in the last 72 hours. Invalid input(s): "POCBNP" No results for input(s): "DDIMER" in the last 72 hours. No results for input(s): "HGBA1C" in the last 72 hours. No results for input(s): "CHOL", "HDL", "LDLCALC", "TRIG", "CHOLHDL", "LDLDIRECT" in the last 72 hours. No results for input(s): "TSH", "T4TOTAL", "T3FREE", "THYROIDAB" in the last 72 hours.  Invalid input(s): "FREET3" No results for input(s): "VITAMINB12", "FOLATE", "FERRITIN", "TIBC", "IRON", "RETICCTPCT" in the last 72 hours. Coags: No results for input(s): "  INR" in the last 72 hours.  Invalid input(s): "PT" Microbiology: Recent Results (from the past 240 hours)  Blood Culture (routine x 2)     Status: None   Collection Time: 08/15/23 10:51 PM   Specimen: BLOOD  Result Value Ref Range Status   Specimen Description BLOOD BLOOD RIGHT HAND  Final   Special Requests   Final    BOTTLES DRAWN AEROBIC ONLY Blood Culture results may not be optimal due to an inadequate volume of blood received in culture bottles   Culture   Final    NO GROWTH 5 DAYS Performed at  [facility], [address], [place], [place] [address]    Report Status 08/20/2023 FINAL  Final  Resp panel by RT-PCR (RSV, Flu A&B, Covid) Anterior Nasal Swab     Status: Abnormal   Collection Time: 08/15/23 10:53 PM   Specimen: Anterior Nasal Swab  Result Value Ref Range Status   SARS Coronavirus 2 by RT PCR NEGATIVE NEGATIVE Final   Influenza A by PCR POSITIVE (A) NEGATIVE Final   Influenza B by PCR NEGATIVE NEGATIVE Final    Comment: (NOTE) The Xpert Xpress SARS-CoV-2/FLU/RSV plus assay is intended as an aid in the diagnosis of influenza from Nasopharyngeal swab specimens and should not be used as a sole basis for treatment. Nasal washings and aspirates are unacceptable for Xpert Xpress SARS-CoV-2/FLU/RSV testing.  Fact Sheet for Patients: BloggerCourse.com  Fact Sheet for Healthcare Providers: SeriousBroker.it  This test is not yet approved or cleared by the [place] FDA and has been authorized for detection and/or diagnosis of SARS-CoV-2 by FDA under an Emergency Use Authorization (EUA). This EUA will remain in effect (meaning this test can be used) for the duration of the COVID-19 declaration under Section 564(b)(1) of the Act, 21 U.S.C. section 360bbb-3(b)(1), unless the authorization is terminated or revoked.     Resp Syncytial Virus by PCR NEGATIVE NEGATIVE Final    Comment: (NOTE) Fact Sheet for Patients: BloggerCourse.com  Fact Sheet for Healthcare Providers: SeriousBroker.it  This test is not yet approved or cleared by the [place] FDA and has been authorized for detection and/or diagnosis of SARS-CoV-2 by FDA under an Emergency Use Authorization (EUA). This EUA will remain in effect (meaning this test can be used) for the duration of the COVID-19 declaration under Section 564(b)(1) of the Act, 21 U.S.C. section 360bbb-3(b)(1), unless the  authorization is terminated or revoked.  Performed at [facility], [address], [place], [place] [address]   Blood Culture (routine x 2)     Status: Abnormal   Collection Time: 08/15/23 10:53 PM   Specimen: BLOOD  Result Value Ref Range Status   Specimen Description BLOOD RIGHT ANTECUBITAL  Final   Special Requests   Final    BOTTLES DRAWN AEROBIC AND ANAEROBIC Blood Culture results may not be optimal due to an inadequate volume of blood received in culture bottles   Culture  Setup Time   Final    GRAM POSITIVE COCCI IN CLUSTERS IN BOTH AEROBIC AND ANAEROBIC BOTTLES CRITICAL RESULT CALLED TO, READ BACK BY AND VERIFIED WITH: PHARMD L. CHEN [id number] @ 1649 FH    Culture (A)  Final    STAPHYLOCOCCUS EPIDERMIDIS STAPHYLOCOCCUS HOMINIS THE SIGNIFICANCE OF ISOLATING THIS ORGANISM FROM A SINGLE SET OF BLOOD CULTURES WHEN MULTIPLE SETS ARE DRAWN IS UNCERTAIN. PLEASE NOTIFY THE MICROBIOLOGY DEPARTMENT WITHIN ONE WEEK IF SPECIATION AND SENSITIVITIES ARE REQUIRED. Performed at [facility], [address], [place], [place] [address]    Report Status 08/18/2023 FINAL  Final  Blood Culture ID Panel (Reflexed)     Status: Abnormal   Collection Time: 08/15/23 10:53 PM  Result Value Ref Range Status   Enterococcus faecalis NOT DETECTED NOT DETECTED Final   Enterococcus Faecium NOT DETECTED NOT DETECTED Final   Listeria monocytogenes NOT DETECTED NOT DETECTED Final   Staphylococcus species DETECTED (A) NOT DETECTED Final    Comment: CRITICAL RESULT CALLED TO, READ BACK BY AND VERIFIED WITH: PHARMD L. CHEN [id number] @ 1649 FH    Staphylococcus aureus (BCID) NOT DETECTED NOT DETECTED Final   Staphylococcus epidermidis DETECTED (A) NOT DETECTED Final    Comment: Methicillin (oxacillin) resistant coagulase negative staphylococcus. Possible blood culture contaminant (unless isolated from more than one blood culture draw or clinical case suggests pathogenicity). No antibiotic treatment is  indicated for blood  culture contaminants. CRITICAL RESULT CALLED TO, READ BACK BY AND VERIFIED WITH: PHARMD L. CHEN [id number] @ 1649 FH    Staphylococcus lugdunensis NOT DETECTED NOT DETECTED Final   Streptococcus species NOT DETECTED NOT DETECTED Final   Streptococcus agalactiae NOT DETECTED NOT DETECTED Final   Streptococcus pneumoniae NOT DETECTED NOT DETECTED Final   Streptococcus pyogenes NOT DETECTED NOT DETECTED Final   A.calcoaceticus-baumannii NOT DETECTED NOT DETECTED Final   Bacteroides fragilis NOT DETECTED NOT DETECTED Final   Enterobacterales NOT DETECTED NOT DETECTED Final   Enterobacter cloacae complex NOT DETECTED NOT DETECTED Final   Escherichia coli NOT DETECTED NOT DETECTED Final   Klebsiella aerogenes NOT DETECTED NOT DETECTED Final   Klebsiella oxytoca NOT DETECTED NOT DETECTED Final   Klebsiella pneumoniae NOT DETECTED NOT DETECTED Final   Proteus species NOT DETECTED NOT DETECTED Final   Salmonella species NOT DETECTED NOT DETECTED Final   Serratia marcescens NOT DETECTED NOT DETECTED Final   Haemophilus influenzae NOT DETECTED NOT DETECTED Final   Neisseria meningitidis NOT DETECTED NOT DETECTED Final   Pseudomonas aeruginosa NOT DETECTED NOT DETECTED Final   Stenotrophomonas maltophilia NOT DETECTED NOT DETECTED Final   Candida albicans NOT DETECTED NOT DETECTED Final   Candida auris NOT DETECTED NOT DETECTED Final   Candida glabrata NOT DETECTED NOT DETECTED Final   Candida krusei NOT DETECTED NOT DETECTED Final   Candida parapsilosis NOT DETECTED NOT DETECTED Final   Candida tropicalis NOT DETECTED NOT DETECTED Final   Cryptococcus neoformans/gattii NOT DETECTED NOT DETECTED Final   Methicillin resistance mecA/C DETECTED (A) NOT DETECTED Final    Comment: CRITICAL RESULT CALLED TO, READ BACK BY AND VERIFIED WITH: PHARMD L. Imogene Burn [id number] @ [phone] FH Performed at [facility], [address], [place], [place] [address]   MRSA Next Gen by PCR, Nasal      Status: None   Collection Time: 08/16/23  2:14 AM   Specimen: Nasal Mucosa; Nasal Swab  Result Value Ref Range Status   MRSA by PCR Next Gen NOT DETECTED NOT DETECTED Final    Comment: (NOTE) The GeneXpert MRSA Assay (FDA approved for NASAL specimens only), is one component of a comprehensive MRSA colonization surveillance program. It is not intended to diagnose MRSA infection nor to guide or monitor treatment for MRSA infections. Test performance is not FDA approved in patients less than 56 years old. Performed at [facility], [address], [place], [place] [address]   Respiratory (~20 pathogens) panel by PCR     Status: Abnormal   Collection Time: 08/18/23  2:25 PM   Specimen:  Nasopharyngeal Swab; Respiratory  Result Value Ref Range Status   Adenovirus NOT DETECTED NOT DETECTED Final   Coronavirus 229E NOT DETECTED NOT DETECTED Final    Comment: (NOTE) The Coronavirus on the Respiratory Panel, DOES NOT test for the novel  Coronavirus (2019 nCoV)    Coronavirus HKU1 NOT DETECTED NOT DETECTED Final   Coronavirus NL63 NOT DETECTED NOT DETECTED Final   Coronavirus OC43 NOT DETECTED NOT DETECTED Final   Metapneumovirus NOT DETECTED NOT DETECTED Final   Rhinovirus / Enterovirus NOT DETECTED NOT DETECTED Final   Influenza A H1 2009 DETECTED (A) NOT DETECTED Final   Influenza B NOT DETECTED NOT DETECTED Final   Parainfluenza Virus 1 NOT DETECTED NOT DETECTED Final   Parainfluenza Virus 2 NOT DETECTED NOT DETECTED Final   Parainfluenza Virus 3 NOT DETECTED NOT DETECTED Final   Parainfluenza Virus 4 NOT DETECTED NOT DETECTED Final   Respiratory Syncytial Virus NOT DETECTED NOT DETECTED Final   Bordetella pertussis NOT DETECTED NOT DETECTED Final   Bordetella Parapertussis NOT DETECTED NOT DETECTED Final   Chlamydophila pneumoniae NOT DETECTED NOT DETECTED Final   Mycoplasma pneumoniae NOT DETECTED NOT DETECTED Final    Comment: Performed at [facility], [address], [place], [place] [address]    FURTHER DISCHARGE INSTRUCTIONS:  Get Medicines reviewed and adjusted: Please take all your medications with you for your next visit with your Primary MD  Laboratory/radiological data: Please request your Primary MD to go over all hospital tests and procedure/radiological results at the follow up, please ask your Primary MD to get all Hospital records sent to his/her office.  In some cases, they will be blood work, cultures and biopsy results pending at the time of your discharge. Please request that your primary care M.D. goes through all the records of your hospital data and follows up on these results.  Also Note the following: If you experience worsening of your admission symptoms, develop shortness of breath, life threatening emergency, suicidal or homicidal thoughts you must seek medical attention immediately by calling 911 or calling your MD immediately  if symptoms less severe.  You must read complete instructions/literature along with all the possible adverse reactions/side effects for all the Medicines you take and that have been prescribed to you. Take any new Medicines after you have completely understood and accpet all the possible adverse reactions/side effects.   Do not drive when taking Pain medications or sleeping medications (Benzodaizepines)  Do not take more than prescribed Pain, Sleep and Anxiety Medications. It is not advisable to combine anxiety,sleep and pain medications without talking with your primary care practitioner  Special Instructions: If you have smoked or chewed Tobacco  in the last 2 yrs please stop smoking, stop any regular Alcohol  and or any Recreational drug use.  Wear Seat belts while driving.  Please note: You were cared for by a hospitalist during your hospital stay. Once you are discharged, your primary care physician will handle any further medical issues. Please note that NO REFILLS for any discharge medications  will be authorized once you are discharged, as it is imperative that you return to your primary care physician (or establish a relationship with a primary care physician if you do not have one) for your post hospital discharge needs so that they can reassess your need for medications and monitor your lab values.  Total Time spent coordinating discharge including counseling, education and face to face time equals greater than 30 minutes.  Signed: Hydrographic surveyor  Nekoda Chock 08/20/2023 9:10 AM

## 2023-08-20 NOTE — TOC Transition Note (Signed)
Transition of Care [facility]) - Discharge Note   Patient Details  Name: Shelby Jordan MRN: [id number] Date of Birth: 15-Jan-1951  [facility]) CM/SW Contact:  Gordy Clement, RN Phone Number: 08/20/2023, 11:01 AM   Clinical Narrative:     Patient to dc to home today  [facility] Health will be provided by [facility]. DME to be delivered to home by [employer] ([facility])  [facility] to transport home  Daughter at the home and will be caretaker. Daughter stated she will call Medicaid to inquire about medicaid number  Daughter does not have Medicaid card/number so RNCM could not fax completed form to [facility] for increased hours of PCS services in the home  No additional TOC needs      Final next level of care: Home w Home Health Services Barriers to Discharge: Continued Medical Work up   Patient Goals and CMS Choice            Discharge Placement                       Discharge Plan and Services Additional resources added to the After Visit Summary for   In-house Referral: Clinical Social Work                                   Social Drivers of Health (SDOH) Interventions SDOH Screenings   Food Insecurity: Patient Unable To Answer (08/16/2023)  Housing: Patient Unable To Answer (08/16/2023)  Transportation Needs: Patient Unable To Answer (08/16/2023)  Utilities: Patient Unable To Answer (08/16/2023)  Alcohol Screen: Low Risk  (07/20/2022)  Depression (PHQ2-9): Low Risk  (04/15/2023)  Financial Resource Strain: Low Risk  (05/13/2022)  Physical Activity: Inactive (05/13/2022)  Social Connections: Patient Unable To Answer (08/16/2023)  Stress: No Stress Concern Present (05/13/2022)  Tobacco Use: Medium Risk (08/15/2023)     Readmission Risk Interventions     No data to display

## 2023-08-23 ENCOUNTER — Telehealth: Payer: Self-pay

## 2023-08-23 ENCOUNTER — Ambulatory Visit: Payer: Self-pay | Admitting: Urology

## 2023-08-23 ENCOUNTER — Other Ambulatory Visit (HOSPITAL_COMMUNITY): Payer: Self-pay

## 2023-08-23 DIAGNOSIS — E872 Acidosis, unspecified: Secondary | ICD-10-CM | POA: Diagnosis not present

## 2023-08-23 DIAGNOSIS — I129 Hypertensive chronic kidney disease with stage 1 through stage 4 chronic kidney disease, or unspecified chronic kidney disease: Secondary | ICD-10-CM | POA: Diagnosis not present

## 2023-08-23 DIAGNOSIS — E7800 Pure hypercholesterolemia, unspecified: Secondary | ICD-10-CM | POA: Diagnosis not present

## 2023-08-23 DIAGNOSIS — N1831 Chronic kidney disease, stage 3a: Secondary | ICD-10-CM | POA: Diagnosis not present

## 2023-08-23 DIAGNOSIS — I700 Atherosclerosis of aorta: Secondary | ICD-10-CM | POA: Diagnosis not present

## 2023-08-23 DIAGNOSIS — F17210 Nicotine dependence, cigarettes, uncomplicated: Secondary | ICD-10-CM | POA: Diagnosis not present

## 2023-08-23 DIAGNOSIS — I10 Essential (primary) hypertension: Secondary | ICD-10-CM | POA: Diagnosis not present

## 2023-08-23 DIAGNOSIS — Z9181 History of falling: Secondary | ICD-10-CM | POA: Diagnosis not present

## 2023-08-23 DIAGNOSIS — Z87440 Personal history of urinary (tract) infections: Secondary | ICD-10-CM | POA: Diagnosis not present

## 2023-08-23 DIAGNOSIS — K5730 Diverticulosis of large intestine without perforation or abscess without bleeding: Secondary | ICD-10-CM | POA: Diagnosis not present

## 2023-08-23 DIAGNOSIS — J9601 Acute respiratory failure with hypoxia: Secondary | ICD-10-CM | POA: Diagnosis not present

## 2023-08-23 DIAGNOSIS — J440 Chronic obstructive pulmonary disease with acute lower respiratory infection: Secondary | ICD-10-CM | POA: Diagnosis not present

## 2023-08-23 NOTE — Transitions of Care (Post Inpatient/ED Visit) (Signed)
08/23/2023  Name: Shelby Jordan MRN: [id number] DOB: [birth year]  Today's TOC FU Call Status: Today's TOC FU Call Status:: Successful TOC FU Call Completed TOC FU Call Complete Date: 08/23/23 Patient's Name and Date of Birth confirmed.  Transition Care Management Follow-up Telephone Call Date of Discharge: 08/20/23 Discharge Facility: [facility]) Type of Discharge: Inpatient Admission Primary Inpatient Discharge Diagnosis:: Flu How have you been since you were released from the hospital?: Better Any questions or concerns?: No  Items Reviewed: Did you receive and understand the discharge instructions provided?: Yes Medications obtained,verified, and reconciled?: Yes (Medications Reviewed) Any new allergies since your discharge?: No Dietary orders reviewed?: Yes Type of Diet Ordered:: Heart Healthy Do you have support at home?: Yes People in Home: sibling(s) Name of Support/Comfort Primary Source: Lashawn Thompkins  Medications Reviewed Today: Medications Reviewed Today     Reviewed by Redge Gainer, RN (Case Manager) on 08/23/23 at 1247  Med List Status: <None>   Medication Order Taking? Sig Documenting Provider Last Dose Status Informant  albuterol (VENTOLIN HFA) 108 (90 Base) MCG/ACT inhaler [id number]  Inhale 2 puffs into the lungs every 4 (four) hours as needed for shortness of breath. Maretta Bees, MD  Active   amLODipine (NORVASC) 5 MG tablet [id number]  Take 1 tablet (5 mg total) by mouth daily. Delton Prairie, MD  Active Child, Family Member, Pharmacy Records  benzonatate (TESSALON) 200 MG capsule [id number]  Take 1 capsule (200 mg total) by mouth 3 (three) times daily as needed for cough. Maretta Bees, MD  Active   divalproex (DEPAKOTE) 250 MG DR tablet [id number]  Take 2 tablets (500 mg total) by mouth 2 (two) times daily. Ghimire, Werner Lean, MD  Active Child, Family Member, Pharmacy Records  fluticasone-salmeterol [facility] INHUB) 100-50 MCG/ACT AEPB  [id number]  Inhale 1 puff into the lungs 2 (two) times daily. Ghimire, Werner Lean, MD  Active   guaiFENesin (MUCINEX) 600 MG 12 hr tablet [id number]  Take 1 tablet (600 mg total) by mouth 2 (two) times daily. Maretta Bees, MD  Active   loperamide (IMODIUM) 2 MG capsule [id number] No Take 1 capsule (2 mg total) by mouth as needed for diarrhea or loose stools.  Patient not taking: Reported on 08/23/2023   Maretta Bees, MD Not Taking Active Child, Family Member, Pharmacy Records           Med Note Nedra Hai, NICOLE   Mon Aug 16, 2023 12:04 AM) For use at [facility]  magnesium gluconate (MAGONATE) 500 MG tablet [id number]  Take 500 mg by mouth at bedtime. [provider]  Active Child, Family Member, Pharmacy Records  metoprolol succinate (TOPROL-XL) 50 MG 24 hr tablet [id number]  TAKE 1 TABLET BY MOUTH EVERY DAY Dugal, Tabitha, FNP  Active Child, Family Member, Pharmacy Records  traZODone (DESYREL) 50 MG tablet [id number]  Take 1 tablet (50 mg total) by mouth at bedtime as needed for sleep. Maretta Bees, MD  Active             Home Care and Equipment/Supplies: Were Home Health Services Ordered?: Yes [facility] of [facility] Agency[facility] Has Agency set up a time to come to your home?: Yes First Home Health Visit Date: 08/24/23 Any new equipment or medical supplies ordered?: No  Functional Questionnaire: Do you need assistance with bathing/showering or dressing?: Yes Do you need assistance with meal preparation?: Yes Do you need assistance with eating?: No Do you have difficulty maintaining continence: No Do you need assistance  with getting out of bed/getting out of a chair/moving?: Yes Do you have difficulty managing or taking your medications?: Yes  Follow up appointments reviewed: PCP Follow-up appointment confirmed?: Yes Date of PCP follow-up appointment?: 09/02/23 Follow-up Provider[facility] Follow-up appointment confirmed?: NA Do you need  transportation to your follow-up appointment?: No Do you understand care options if your condition(s) worsen?: Yes-patient verbalized understanding  SDOH Interventions Today    Flowsheet Row Most Recent Value  SDOH Interventions   Food Insecurity Interventions Intervention Not Indicated  Housing Interventions Intervention Not Indicated  Transportation Interventions Intervention Not Indicated  Social Connections Interventions Intervention Not Indicated      Interventions Today    Flowsheet Row Most Recent Value  Chronic Disease   Chronic disease during today's visit Other  [Seizure disorder]  General Interventions   General Interventions Discussed/Reviewed General Interventions Discussed  Exercise Interventions   Exercise Discussed/Reviewed Physical Activity  Physical Activity Discussed/Reviewed Physical Activity Discussed  Education Interventions   Education Provided Provided Education  Provided Verbal Education On Medication, Applications, When to see the doctor  Pharmacy Interventions   Pharmacy Dicussed/Reviewed Medications and their functions  Safety Interventions   Safety Discussed/Reviewed Fall Risk       The patient has been provided with contact information for the care management team and has been advised to call with any health-related questions or concerns. The patient verbalized understanding with current POC. The patient is directed to their insurance card regarding availability of benefits coverage.   Deidre Ala, BSN, RN [place]  VBCI - [employer] [facility] RN Care Manager [phone]

## 2023-08-24 ENCOUNTER — Ambulatory Visit: Payer: Self-pay | Admitting: *Deleted

## 2023-08-24 NOTE — Patient Outreach (Signed)
  Care Coordination   Initial Visit Note   08/24/2023 Name: Lupe Handley MRN: [id number] DOB: 08/27/1950  Carlotta Telfair is a 73 y.o. year old female who sees Corwin Antu, FNP for primary care. I spoke with  Aluel Koman's daughter by phone today.  What matters to the patients health and wellness today?  Referral received to assist patient's daughter with request for additional personal care services. Patient's daughter confirms that she has already started process for CAP services which is provide additional in home care hours as well as additional coordinated services to assist patient with remaining in her own home. Patient's daughter agrees to sign consent forms and forward the forms to patient's provider once signed for completion. Patient will be scheduled for an in home assessment once received.    Goals Addressed             This Visit's Progress    In home care resources       Activities and task to complete in order to accomplish goals.   Confirmed that patient's daughter has completed a referral to [facility] to establish care.  Look for a packet in the mail from [place] Please sign consent forms and forward packet to patient's provider to complete         SDOH assessments and interventions completed:  Yes  SDOH Interventions Today    Flowsheet Row Most Recent Value  SDOH Interventions   Food Insecurity Interventions Intervention Not Indicated  Housing Interventions Intervention Not Indicated  Transportation Interventions Intervention Not Indicated  Utilities Interventions Intervention Not Indicated        Care Coordination Interventions:  Yes, provided  Interventions Today    Flowsheet Row Most Recent Value  Chronic Disease   Chronic disease during today's visit Other  [seizure disorder]  General Interventions   General Interventions Discussed/Reviewed General Interventions Discussed, Community Resources, Level of Care   [confirmed that patient recently discharged from the hospital-declined SNF-discharged home with [facility] services with [facility]]  Level of Care Personal Care Services  [confirmed that patient has 18 hours per month of personal care services-3 hours 4 days a week and 2 hours 3 days a week-daughter currently applying for CAP/DA waiver]  Mental Health Interventions   Mental Health Discussed/Reviewed Mental Health Discussed  [patient active with [facility]]       Follow up plan: No further intervention required.   Encounter Outcome:  Patient Visit Completed

## 2023-08-24 NOTE — Patient Instructions (Signed)
Visit Information  Thank you for taking time to visit with me today. Please don't hesitate to contact me if I can be of assistance to you.   Following are the goals we discussed today:   Goals Addressed             This Visit's Progress    COMPLETED: In home care resources       Activities and task to complete in order to accomplish goals.   Confirmed that patient's daughter has completed a referral to [facility] to establish care.  Look for a packet in the mail from [place] Please sign consent forms and forward packet to patient's provider to complete          If you are experiencing a Mental Health or Behavioral Health Crisis or need someone to talk to, please call 911   Patient verbalizes understanding of instructions and care plan provided today and agrees to view in MyChart. Active MyChart status and patient understanding of how to access instructions and care plan via MyChart confirmed with patient.     No further follow up required: patient's daughter to contact this Child psychotherapist with any additional community resource needs  [employer], [employer] [place]  [facility], [facility] Licensed Clinical Social Geologist, engineering Dial: [phone]

## 2023-08-26 ENCOUNTER — Ambulatory Visit: Payer: 59

## 2023-08-26 ENCOUNTER — Ambulatory Visit: Payer: 59 | Admitting: Family

## 2023-08-26 VITALS — BP 132/60 | Ht 64.0 in | Wt 198.8 lb

## 2023-08-26 DIAGNOSIS — Z0000 Encounter for general adult medical examination without abnormal findings: Secondary | ICD-10-CM

## 2023-08-26 DIAGNOSIS — E872 Acidosis, unspecified: Secondary | ICD-10-CM | POA: Diagnosis not present

## 2023-08-26 DIAGNOSIS — Z87440 Personal history of urinary (tract) infections: Secondary | ICD-10-CM | POA: Diagnosis not present

## 2023-08-26 DIAGNOSIS — N1831 Chronic kidney disease, stage 3a: Secondary | ICD-10-CM | POA: Diagnosis not present

## 2023-08-26 DIAGNOSIS — Z122 Encounter for screening for malignant neoplasm of respiratory organs: Secondary | ICD-10-CM

## 2023-08-26 DIAGNOSIS — I700 Atherosclerosis of aorta: Secondary | ICD-10-CM | POA: Diagnosis not present

## 2023-08-26 DIAGNOSIS — K5730 Diverticulosis of large intestine without perforation or abscess without bleeding: Secondary | ICD-10-CM | POA: Diagnosis not present

## 2023-08-26 DIAGNOSIS — J9601 Acute respiratory failure with hypoxia: Secondary | ICD-10-CM | POA: Diagnosis not present

## 2023-08-26 DIAGNOSIS — Z1211 Encounter for screening for malignant neoplasm of colon: Secondary | ICD-10-CM | POA: Diagnosis not present

## 2023-08-26 DIAGNOSIS — J440 Chronic obstructive pulmonary disease with acute lower respiratory infection: Secondary | ICD-10-CM | POA: Diagnosis not present

## 2023-08-26 DIAGNOSIS — F17210 Nicotine dependence, cigarettes, uncomplicated: Secondary | ICD-10-CM | POA: Diagnosis not present

## 2023-08-26 DIAGNOSIS — E7800 Pure hypercholesterolemia, unspecified: Secondary | ICD-10-CM | POA: Diagnosis not present

## 2023-08-26 DIAGNOSIS — I129 Hypertensive chronic kidney disease with stage 1 through stage 4 chronic kidney disease, or unspecified chronic kidney disease: Secondary | ICD-10-CM | POA: Diagnosis not present

## 2023-08-26 DIAGNOSIS — I10 Essential (primary) hypertension: Secondary | ICD-10-CM | POA: Diagnosis not present

## 2023-08-26 DIAGNOSIS — Z9181 History of falling: Secondary | ICD-10-CM | POA: Diagnosis not present

## 2023-08-26 NOTE — Progress Notes (Signed)
 Subjective:   Shelby Jordan is a 73 y.o. female who presents for Medicare Annual (Subsequent) preventive examination.  Visit Complete: In person  Patient Medicare AWV questionnaire was completed by the patient on (not done); I have confirmed that all information answered by patient is correct and no changes since this date.  Cardiac Risk Factors include: advanced age (>59men, >52 women);hypertension;sedentary lifestyle;obesity (BMI >30kg/m2);smoking/ tobacco exposure    Objective:    Today's Vitals   08/26/23 1139 08/26/23 1142  BP:  132/60  Weight: 198 lb 12.8 oz (90.2 kg)   Height: 5' 4 (1.626 m)   PainSc:  5    Body mass index is 34.12 kg/m.     08/16/2023    1:45 PM 08/15/2023   10:44 PM 08/11/2023    3:00 PM 07/20/2022    1:00 PM 07/19/2022   10:42 AM 05/22/2022    4:40 PM 05/13/2022    2:15 PM  Advanced Directives  Does Patient Have a Medical Advance Directive? No Yes No  No No No  Would patient like information on creating a medical advance directive? No - Patient declined  Yes (Inpatient - patient defers creating a medical advance directive and declines information at this time)  No - Patient declined  No - Patient declined     Information is confidential and restricted. Go to Review Flowsheets to unlock data.    Current Medications (verified) Outpatient Encounter Medications as of 08/26/2023  Medication Sig   albuterol  (VENTOLIN  HFA) 108 (90 Base) MCG/ACT inhaler Inhale 2 puffs into the lungs every 4 (four) hours as needed for shortness of breath.   amLODipine  (NORVASC ) 5 MG tablet Take 1 tablet (5 mg total) by mouth daily.   benzonatate  (TESSALON ) 200 MG capsule Take 1 capsule (200 mg total) by mouth 3 (three) times daily as needed for cough. (Patient not taking: Reported on 08/26/2023)   divalproex  (DEPAKOTE ) 250 MG DR tablet Take 2 tablets (500 mg total) by mouth 2 (two) times daily.   fluticasone -salmeterol (WIXELA INHUB ) 100-50 MCG/ACT AEPB Inhale 1 puff into the  lungs 2 (two) times daily.   guaiFENesin  (MUCINEX ) 600 MG 12 hr tablet Take 1 tablet (600 mg total) by mouth 2 (two) times daily.   loperamide  (IMODIUM ) 2 MG capsule Take 1 capsule (2 mg total) by mouth as needed for diarrhea or loose stools. (Patient not taking: Reported on 08/23/2023)   magnesium  gluconate (MAGONATE) 500 MG tablet Take 500 mg by mouth at bedtime.   metoprolol  succinate (TOPROL -XL) 50 MG 24 hr tablet TAKE 1 TABLET BY MOUTH EVERY DAY   traZODone  (DESYREL ) 50 MG tablet Take 1 tablet (50 mg total) by mouth at bedtime as needed for sleep.   No facility-administered encounter medications on file as of 08/26/2023.    Allergies (verified) Olanzapine , Tomato (diagnostic), and Citrus   History: Past Medical History:  Diagnosis Date   Bipolar 1 disorder (HCC)    Depression    Hepatitis C    High cholesterol    Hypertension    Seizures (HCC)    UTI (urinary tract infection)    Past Surgical History:  Procedure Laterality Date   APPENDECTOMY     BREAST BIOPSY Right 03/04/2022   stereo bx, mass X clip-path pending   TOE AMPUTATION     Family History  Problem Relation Age of Onset   Mental illness Mother        born in mental hospital   Breast cancer Neg Hx  Social History   Socioeconomic History   Marital status: Legally Separated    Spouse name: Not on file   Number of children: Not on file   Years of education: Not on file   Highest education level: Not on file  Occupational History   Not on file  Tobacco Use   Smoking status: Former    Current packs/day: 0.00    Average packs/day: 1 pack/day for 30.0 years (30.0 ttl pk-yrs)    Types: Cigarettes    Start date: 63    Quit date: 2023    Years since quitting: 2.1    Passive exposure: Past   Smokeless tobacco: Never  Vaping Use   Vaping status: Never Used  Substance and Sexual Activity   Alcohol use: Never   Drug use: Never   Sexual activity: Not Currently    Partners: Male  Other Topics Concern    Not on file  Social History Narrative   Not on file   Social Drivers of Health   Financial Resource Strain: Low Risk  (08/26/2023)   Overall Financial Resource Strain (CARDIA)    Difficulty of Paying Living Expenses: Not hard at all  Food Insecurity: No Food Insecurity (08/26/2023)   Hunger Vital Sign    Worried About Running Out of Food in the Last Year: Never true    Ran Out of Food in the Last Year: Never true  Transportation Needs: No Transportation Needs (08/26/2023)   PRAPARE - Administrator, Civil Service (Medical): No    Lack of Transportation (Non-Medical): No  Physical Activity: Inactive (08/26/2023)   Exercise Vital Sign    Days of Exercise per Week: 0 days    Minutes of Exercise per Session: 0 min  Stress: No Stress Concern Present (08/26/2023)   [employer] of Occupational Health - Occupational Stress Questionnaire    Feeling of Stress : Not at all  Social Connections: Socially Isolated (08/26/2023)   Social Connection and Isolation Panel [NHANES]    Frequency of Communication with Friends and Family: More than three times a week    Frequency of Social Gatherings with Friends and Family: Once a week    Attends Religious Services: Never    Database Administrator or Organizations: No    Attends Banker Meetings: Never    Marital Status: Widowed    Tobacco Counseling Counseling given: Not Answered   Clinical Intake:  Pre-visit preparation completed: No  Pain : 0-10 Pain Score: 5  Pain Type: Acute pain, Chronic pain Pain Location: Ear (both ears) Pain Descriptors / Indicators: Aching Pain Onset: 1 to 4 weeks ago Pain Frequency: Intermittent Pain Relieving Factors: ASA  Pain Relieving Factors: ASA  BMI - recorded: 34.12 Nutritional Status: BMI > 30  Obese Nutritional Risks: None Diabetes: No  How often do you need to have someone help you when you read instructions, pamphlets, or other written materials from your doctor or  pharmacy?: 4 - Often  Interpreter Needed?: No  Comments: lives with daughter Information entered by :: B.Emelyn Roen,LPN   Activities of Daily Living    08/26/2023   11:57 AM 08/16/2023    1:45 PM  In your present state of health, do you have any difficulty performing the following activities:  Hearing? 0 0  Vision? 0 0  Difficulty concentrating or making decisions? 1 0  Walking or climbing stairs? 1   Dressing or bathing? 1   Doing errands, shopping? 1 1  Preparing Food and  eating ? Y   Using the Toilet? Y   In the past six months, have you accidently leaked urine? Y   Do you have problems with loss of bowel control? Y   Managing your Medications? Y   Managing your Finances? Y   Housekeeping or managing your Housekeeping? Y     Patient Care Team: Corwin Antu, FNP as PCP - General (Family Medicine) Blaine, Chrystal M, LCSW as Social Worker [place], [place], OD (Optometry)  Indicate any recent Medical Services you may have received from other than [facility] providers in the past year (date may be approximate).     Assessment:   This is a routine wellness examination for Shelby Jordan.  Hearing/Vision screen Hearing Screening - Comments:: Pt says her hearing is good Vision Screening - Comments:: Pt says her vision is good with glasses Dr Selma due   Goals Addressed             This Visit's Progress    Adult Day Treatment options   On track    Interventions Today    Flowsheet Row Most Recent Value  Chronic Disease   Chronic disease during today's visit Other  [Bi-polar disorder]  General Interventions   General Interventions Discussed/Reviewed General Interventions Discussed, Community Resources, Level of Care, Doctor Visits  Doctor Visits Discussed/Reviewed Doctor Visits Reviewed, Specialist, PCP  [facility] 04/15/23 Urologist 9/30]  Level of Care --  [Adult Day Program options discussed for daily structure and supervision-daughter decided against [employer] to consider [facility] [phone]]  Education Interventions   Education Provided Provided Education  Provided Verbal Education On [employer] covered by patient's insurance]           Patient Stated       I would like to get better and walk on my own.       Depression Screen    08/26/2023   11:53 AM 04/15/2023   12:03 PM 12/10/2022   11:33 AM 06/25/2022   12:35 PM 05/13/2022    2:10 PM  PHQ 2/9 Scores  PHQ - 2 Score 0 0 3 0 0  PHQ- 9 Score  3 10      Fall Risk    08/26/2023   11:48 AM 04/15/2023   12:03 PM 02/09/2023   12:41 PM 12/10/2022   11:32 AM 08/31/2022   12:37 PM  Fall Risk   Falls in the past year? 1 1 0 0 1  Number falls in past yr: 0 1 0 0 1  Injury with Fall? 1 0 0 0 1  Comment fell at home outside      Risk for fall due to : Impaired balance/gait;Impaired mobility History of fall(s) No Fall Risks  History of fall(s)  Follow up Education provided;Falls prevention discussed Falls evaluation completed Falls evaluation completed Falls evaluation completed;Education provided;Falls prevention discussed Falls evaluation completed    MEDICARE RISK AT HOME: Medicare Risk at Home Any stairs in or around the home?: Yes If so, are there any without handrails?: Yes Home free of loose throw rugs in walkways, pet beds, electrical cords, etc?: Yes Adequate lighting in your home to reduce risk of falls?: Yes Life alert?: No Use of a cane, walker or w/c?: Yes (waiting for cane, walker and elevated toilet piece) Grab bars in the bathroom?: No Shower chair or bench in shower?: Yes Elevated toilet seat or a handicapped toilet?: No  TIMED UP AND GO:  Was the test performed?  No  Cognitive Function:    03/02/2022    2:27 PM  MMSE - Mini Mental State Exam  Orientation to time 4        08/26/2023   12:07 PM 05/13/2022    2:15 PM  6CIT Screen  What Year? 0 points 0 points  What month? 0 points 0 points  What time? 0  points 0 points  Count back from 20 4 points 0 points  Months in reverse 4 points 4 points  Repeat phrase 2 points 0 points  Total Score 10 points 4 points    Immunizations Immunization History  Administered Date(s) Administered   Influenza, High Dose Seasonal PF 08/23/2017   Influenza,inj,Quad PF,6+ Mos 05/11/2016, 07/15/2022   Moderna Covid-19 Fall Seasonal Vaccine 42yrs & older 07/15/2022   PFIZER Comirnaty(Gray Top)Covid-19 Tri-Sucrose Vaccine 10/31/2020   PFIZER(Purple Top)SARS-COV-2 Vaccination 01/18/2020   Pneumococcal Conjugate-13 01/04/2017   Pneumococcal Polysaccharide-23 01/04/2013   Tdap 01/04/2013    TDAP status: Up to date  Flu Vaccine status: Due, Education has been provided regarding the importance of this vaccine. Advised may receive this vaccine at local pharmacy or Health Dept. Aware to provide a copy of the vaccination record if obtained from local pharmacy or Health Dept. Verbalized acceptance and understanding. Pt just in hospital with flu  Pneumococcal vaccine status: Up to date  Covid-19 vaccine status: Completed vaccines  Qualifies for Shingles Vaccine? Yes   Zostavax completed No   Shingrix Completed?: No.    Education has been provided regarding the importance of this vaccine. Patient has been advised to call insurance company to determine out of pocket expense if they have not yet received this vaccine. Advised may also receive vaccine at local pharmacy or Health Dept. Verbalized acceptance and understanding.  Screening Tests Health Maintenance  Topic Date Due   Zoster Vaccines- Shingrix (1 of 2) Never done   Colonoscopy  Never done   Pneumonia Vaccine 33+ Years old (3 of 3 - PPSV23 or PCV20) 01/04/2018   DTaP/Tdap/Td (2 - Td or Tdap) 01/05/2023   INFLUENZA VACCINE  02/18/2023   COVID-19 Vaccine (4 - 2024-25 season) 03/21/2023   MAMMOGRAM  02/03/2024   Lung Cancer Screening  08/15/2024   Medicare Annual Wellness (AWV)  08/25/2024   DEXA SCAN   Completed   Hepatitis C Screening  Completed   HPV VACCINES  Aged Out    Health Maintenance  Health Maintenance Due  Topic Date Due   Zoster Vaccines- Shingrix (1 of 2) Never done   Colonoscopy  Never done   Pneumonia Vaccine 42+ Years old (3 of 3 - PPSV23 or PCV20) 01/04/2018   DTaP/Tdap/Td (2 - Td or Tdap) 01/05/2023   INFLUENZA VACCINE  02/18/2023   COVID-19 Vaccine (4 - 2024-25 season) 03/21/2023    Colorectal cancer screening: Referral to GI placed yes. Pt aware the office will call re: appt.  Mammogram status: Completed 02/03/23. Repeat every year  Bone Density status: Completed 02/02/2022. Results reflect: Bone density results: OSTEOPENIA. Repeat every 5 years.  Lung Cancer Screening: (Low Dose CT Chest recommended if Age 37-80 years, 20 pack-year currently smoking OR have quit w/in 15years.) does qualify.   Lung Cancer Screening Referral: yes   Additional Screening:  Hepatitis C Screening: does not qualify; Completed 11/28/2021  Vision Screening: Recommended annual ophthalmology exams for early detection of glaucoma and other disorders of the eye. Is the patient up to date with their annual eye exam?  Yes  Who is the provider or what is  the name of the office in which the patient attends annual eye exams? Dr Portia If pt is not established with a provider, would they like to be referred to a provider to establish care? No .   Dental Screening: Recommended annual dental exams for proper oral hygiene  Diabetic Foot Exam: n/a  Community Resource Referral / Chronic Care Management: CRR required this visit?  No   CCM required this visit?  No    Plan:     I have personally reviewed and noted the following in the patient's chart:   Medical and social history Use of alcohol, tobacco or illicit drugs  Current medications and supplements including opioid prescriptions. Patient is not currently taking opioid prescriptions. Functional ability and  status Nutritional status Physical activity Advanced directives List of other physicians Hospitalizations, surgeries, and ER visits in previous 12 months Vitals Screenings to include cognitive, depression, and falls Referrals and appointments  In addition, I have reviewed and discussed with patient certain preventive protocols, quality metrics, and best practice recommendations. A written personalized care plan for preventive services as well as general preventive health recommendations were provided to patient.    Erminio LITTIE Saris, LPN   01/19/7973   After Visit Summary: (In Person-Printed) AVS printed and given to the patient  Nurse Notes: pt brought in by her daughter Karolee) via w/c. They states she released from the hospital 3 days ago with flu and pneumonia. Pt relays it has been about 2-3 weeks since she walked independently. Pt daughter relays she removed her mother from the rehab facility as it was unacceptable and not clean. She relays she gets PT at home and is waiting a cane, walker and elevated toilet seat for pt. She relays pt had seizures also as she was off Depakote . Pt has psychology appt tomorrow at [facility].

## 2023-08-26 NOTE — Patient Instructions (Signed)
 Shelby Jordan , Thank you for taking time to come for your Medicare Wellness Visit. I appreciate your ongoing commitment to your health goals. Please review the following plan we discussed and let me know if I can assist you in the future.   Referrals/Orders/Follow-Ups/Clinician Recommendations:   Referral for Colonoscopy [facility] [address] [place],  [place]  [address] Main: [phone]   Lung Cancer Screening to: [facility] [address] [place],  [place]  [address] Main: [phone]   This is a list of the screening recommended for you and due dates:  Health Maintenance  Topic Date Due   Zoster (Shingles) Vaccine (1 of 2) Never done   Colon Cancer Screening  Never done   Pneumonia Vaccine (3 of 3 - PPSV23 or PCV20) 01/04/2018   DTaP/Tdap/Td vaccine (2 - Td or Tdap) 01/05/2023   Flu Shot  02/18/2023   COVID-19 Vaccine (4 - 2024-25 season) 03/21/2023   Mammogram  02/03/2024   Screening for Lung Cancer  08/15/2024   Medicare Annual Wellness Visit  08/25/2024   DEXA scan (bone density measurement)  Completed   Hepatitis C Screening  Completed   HPV Vaccine  Aged Out    Advanced directives: (Declined) Advance directive discussed with you today. Even though you declined this today, please call our office should you change your mind, and we can give you the proper paperwork for you to fill out.  Next Medicare Annual Wellness Visit scheduled for next year: Yes  08/29/2024 @ 11:30am in person

## 2023-08-27 DIAGNOSIS — E872 Acidosis, unspecified: Secondary | ICD-10-CM | POA: Diagnosis not present

## 2023-08-27 DIAGNOSIS — J440 Chronic obstructive pulmonary disease with acute lower respiratory infection: Secondary | ICD-10-CM | POA: Diagnosis not present

## 2023-08-27 DIAGNOSIS — F17210 Nicotine dependence, cigarettes, uncomplicated: Secondary | ICD-10-CM | POA: Diagnosis not present

## 2023-08-27 DIAGNOSIS — J9601 Acute respiratory failure with hypoxia: Secondary | ICD-10-CM | POA: Diagnosis not present

## 2023-08-27 DIAGNOSIS — K5730 Diverticulosis of large intestine without perforation or abscess without bleeding: Secondary | ICD-10-CM | POA: Diagnosis not present

## 2023-08-27 DIAGNOSIS — Z9181 History of falling: Secondary | ICD-10-CM | POA: Diagnosis not present

## 2023-08-27 DIAGNOSIS — N1831 Chronic kidney disease, stage 3a: Secondary | ICD-10-CM | POA: Diagnosis not present

## 2023-08-27 DIAGNOSIS — I129 Hypertensive chronic kidney disease with stage 1 through stage 4 chronic kidney disease, or unspecified chronic kidney disease: Secondary | ICD-10-CM | POA: Diagnosis not present

## 2023-08-27 DIAGNOSIS — E7800 Pure hypercholesterolemia, unspecified: Secondary | ICD-10-CM | POA: Diagnosis not present

## 2023-08-27 DIAGNOSIS — I10 Essential (primary) hypertension: Secondary | ICD-10-CM | POA: Diagnosis not present

## 2023-08-27 DIAGNOSIS — I700 Atherosclerosis of aorta: Secondary | ICD-10-CM | POA: Diagnosis not present

## 2023-08-27 DIAGNOSIS — Z87440 Personal history of urinary (tract) infections: Secondary | ICD-10-CM | POA: Diagnosis not present

## 2023-08-30 DIAGNOSIS — N1831 Chronic kidney disease, stage 3a: Secondary | ICD-10-CM | POA: Diagnosis not present

## 2023-08-30 DIAGNOSIS — Z87440 Personal history of urinary (tract) infections: Secondary | ICD-10-CM | POA: Diagnosis not present

## 2023-08-30 DIAGNOSIS — Z9181 History of falling: Secondary | ICD-10-CM | POA: Diagnosis not present

## 2023-08-30 DIAGNOSIS — I700 Atherosclerosis of aorta: Secondary | ICD-10-CM | POA: Diagnosis not present

## 2023-08-30 DIAGNOSIS — K5730 Diverticulosis of large intestine without perforation or abscess without bleeding: Secondary | ICD-10-CM | POA: Diagnosis not present

## 2023-08-30 DIAGNOSIS — I10 Essential (primary) hypertension: Secondary | ICD-10-CM | POA: Diagnosis not present

## 2023-08-30 DIAGNOSIS — F17210 Nicotine dependence, cigarettes, uncomplicated: Secondary | ICD-10-CM | POA: Diagnosis not present

## 2023-08-30 DIAGNOSIS — I129 Hypertensive chronic kidney disease with stage 1 through stage 4 chronic kidney disease, or unspecified chronic kidney disease: Secondary | ICD-10-CM | POA: Diagnosis not present

## 2023-08-30 DIAGNOSIS — E872 Acidosis, unspecified: Secondary | ICD-10-CM | POA: Diagnosis not present

## 2023-08-30 DIAGNOSIS — E7800 Pure hypercholesterolemia, unspecified: Secondary | ICD-10-CM | POA: Diagnosis not present

## 2023-08-30 DIAGNOSIS — J9601 Acute respiratory failure with hypoxia: Secondary | ICD-10-CM | POA: Diagnosis not present

## 2023-08-30 DIAGNOSIS — J440 Chronic obstructive pulmonary disease with acute lower respiratory infection: Secondary | ICD-10-CM | POA: Diagnosis not present

## 2023-08-31 ENCOUNTER — Ambulatory Visit (INDEPENDENT_AMBULATORY_CARE_PROVIDER_SITE_OTHER): Payer: 59 | Admitting: Podiatry

## 2023-08-31 ENCOUNTER — Encounter: Payer: Self-pay | Admitting: Podiatry

## 2023-08-31 VITALS — Ht 64.0 in | Wt 198.8 lb

## 2023-08-31 DIAGNOSIS — M79675 Pain in left toe(s): Secondary | ICD-10-CM | POA: Diagnosis not present

## 2023-08-31 DIAGNOSIS — M79674 Pain in right toe(s): Secondary | ICD-10-CM

## 2023-08-31 DIAGNOSIS — L853 Xerosis cutis: Secondary | ICD-10-CM | POA: Diagnosis not present

## 2023-08-31 DIAGNOSIS — B351 Tinea unguium: Secondary | ICD-10-CM

## 2023-08-31 MED ORDER — AMMONIUM LACTATE 12 % EX LOTN: 1.0000 | TOPICAL_LOTION | CUTANEOUS | 0 refills | Status: AC | PRN

## 2023-08-31 NOTE — Progress Notes (Signed)
This patient returns to my office for at risk foot care.  This patient requires this care by a professional since this patient will be at risk due to having CKD.  This patient is unable to cut nails herself since the patient cannot reach her nails.These nails are painful walking and wearing shoes.  This patient presents for at risk foot care today.  General Appearance  Alert, conversant and in no acute stress.  Vascular  Dorsalis pedis and posterior tibial  pulses are palpable  bilaterally.  Capillary return is within normal limits  bilaterally. Temperature is within normal limits  bilaterally.  Neurologic  Senn-Weinstein monofilament wire test within normal limits  bilaterally. Muscle power within normal limits bilaterally.  Nails Thick disfigured discolored nails with subungual debris  from hallux to fifth toes right foot and debride nails 1-4 left foot. No evidence of bacterial infection or drainage bilaterally.  Orthopedic  No limitations of motion  feet .  No crepitus or effusions noted.  No bony pathology or digital deformities noted. Amputation fifth toe left.  HAV  B/L  Skin  normotropic skin with no porokeratosis noted bilaterally.  No signs of infections or ulcers noted.   Dryscaly heel with fissure.  Onychomycosis  Pain in right toes  Pain in left toes  Consent was obtained for treatment procedures.   Mechanical debridement of nails 1-5  bilaterally performed with a nail nipper.  Filed with dremel without incident. Debride callus hallux. with # 15 blade.Told to use moisturizer on heels  B/L.  Xerosis skin given the patient has failed multiple outpatient therapy patient would benefit from ammonium lactate which was sent to the pharmacy   Return office visit  3 months                   Told patient to return for periodic foot care and evaluation due to potential at risk complications.   Nicholes Rough D.P.M.

## 2023-09-01 ENCOUNTER — Telehealth: Payer: Self-pay | Admitting: *Deleted

## 2023-09-01 NOTE — Patient Instructions (Signed)
Visit Information  Thank you for taking time to visit with me today. Please don't hesitate to contact me if I can be of assistance to you.   Following are the goals we discussed today:   Goals Addressed             This Visit's Progress    Short term rehab options       Activities and task to complete in order to accomplish goals.   LEVELS OF CARE TASK CSW to follow up with previous facilities that provided bed offers for patient following last hospitalization for placement consideration. Patient's daughter to follow up with referral for CAP services         Our next appointment is by telephone on 09/02/23 at 9:30am  Please call the care guide team at [phone] if you need to cancel or reschedule your appointment.   If you are experiencing a Mental Health or Behavioral Health Crisis or need someone to talk to, please call 911   Patient verbalizes understanding of instructions and care plan provided today and agrees to view in MyChart. Active MyChart status and patient understanding of how to access instructions and care plan via MyChart confirmed with patient.     Telephone follow up appointment with care management team member scheduled for: 09/02/23  Verna Czech, LCSW [place]  [facility] Licensed Clinical Social Worker Care Coordinator  Direct Dial: [phone]

## 2023-09-01 NOTE — Patient Outreach (Signed)
  Care Coordination   Follow Up Visit Note   09/01/2023 Name: Shelby Jordan MRN: [id number] DOB: 25-Sep-1950  Shelby Jordan is a 73 y.o. year old female who sees Mort Sawyers, FNP for primary care. I spoke with  Shelby Jordan's daughter Shelby Jordan by phone today.  What matters to the patients health and wellness today?  Placement in short term rehab    Goals Addressed             This Visit's Progress    Short term rehab options       Activities and task to complete in order to accomplish goals.   LEVELS OF CARE TASK CSW to follow up with previous facilities that provided bed offers for patient following last hospitalization for placement consideration. Patient's daughter to follow up with referral for CAP services         SDOH assessments and interventions completed:  No     Care Coordination Interventions:  Yes, provided  Interventions Today    Flowsheet Row Most Recent Value  Chronic Disease   Chronic disease during today's visit Other  [seizure disorder, stroke]  General Interventions   General Interventions Discussed/Reviewed General Interventions Reviewed, Level of Care  [daughter states that patient may need rehab-reports that her condition continues to deterioriate and would like to re-consider placement]  Level of Care Skilled Nursing Facility  [confirmed previous bed offers-CSW will contact facilities for re-consideration]       Follow up plan: Follow up call scheduled for 09/02/23    Encounter Outcome:  Patient Visit Completed

## 2023-09-02 ENCOUNTER — Ambulatory Visit: Payer: 59 | Admitting: *Deleted

## 2023-09-02 ENCOUNTER — Ambulatory Visit: Payer: 59 | Admitting: Internal Medicine

## 2023-09-02 ENCOUNTER — Telehealth: Payer: Self-pay | Admitting: Family

## 2023-09-02 ENCOUNTER — Encounter: Payer: Self-pay | Admitting: Internal Medicine

## 2023-09-02 ENCOUNTER — Ambulatory Visit: Payer: 59 | Admitting: Family

## 2023-09-02 VITALS — BP 110/70 | HR 62 | Temp 97.5°F | Ht 64.0 in | Wt 199.0 lb

## 2023-09-02 DIAGNOSIS — J449 Chronic obstructive pulmonary disease, unspecified: Secondary | ICD-10-CM | POA: Diagnosis not present

## 2023-09-02 DIAGNOSIS — F250 Schizoaffective disorder, bipolar type: Secondary | ICD-10-CM

## 2023-09-02 DIAGNOSIS — G40909 Epilepsy, unspecified, not intractable, without status epilepticus: Secondary | ICD-10-CM

## 2023-09-02 DIAGNOSIS — N1831 Chronic kidney disease, stage 3a: Secondary | ICD-10-CM

## 2023-09-02 DIAGNOSIS — G8191 Hemiplegia, unspecified affecting right dominant side: Secondary | ICD-10-CM | POA: Insufficient documentation

## 2023-09-02 NOTE — Assessment & Plan Note (Signed)
Will recheck

## 2023-09-02 NOTE — Assessment & Plan Note (Signed)
I am not sure about this diagnosis Is establishing with new psychiatrist

## 2023-09-02 NOTE — Assessment & Plan Note (Signed)
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

## 2023-09-02 NOTE — Progress Notes (Signed)
Subjective:    Patient ID: Shelby Jordan, female    DOB: [birth year], 73 y.o.   MRN: [id number]  HPIHere for hospital follow up With daughter LaShawn  Admitted 1/19-1/23 for apparent breakthrough seizures (had shifted to the side and was shaking). Sent home, then daughter found her in the closet. EMTs felt she might have had a stroke--then took her to [facility] Had 4 seizures that one day---daughter concerned that she had stroke (not found on imaging) EEG didn't show seizures Has been treated with with depakote (but daughter feels this was for her bipolar)--has been on it for many decades Olanzapine was stopped--concern for extrapyramidal side effects Felt to be unstable--so discharged to [facility] Daughter signed her out the next day  Back to hospital 1/26 with bad cough change in mental status and SOB--- and found to have influenza infection Treated with tamiflu for 5 days--then able to wean back to room air Does have known COPD  GFR in 50's throughout this time  Lives with daughter She manages her medications Has been shuffling some at home---only able to take one step at a time Waiting for a walker Needs help getting dressed, bathing Needs assist with bathroom---transfers and cleaning Has shower chair Legs give out---can't up and down stairs (stays upstairs all the time---and it takes a long time to navigate the stairs--like to come out for appointment)  Is getting PT twice a week with home care  Current Outpatient Medications on File Prior to Visit  Medication Sig Dispense Refill   albuterol (VENTOLIN HFA) 108 (90 Base) MCG/ACT inhaler Inhale 2 puffs into the lungs every 4 (four) hours as needed for shortness of breath. 18 g 1   amLODipine (NORVASC) 5 MG tablet Take 1 tablet (5 mg total) by mouth daily. 30 tablet 0   ammonium lactate (AMLACTIN DAILY) 12 % lotion Apply 1 Application topically as needed. 400 g 0   divalproex (DEPAKOTE) 250 MG DR tablet Take 2 tablets  (500 mg total) by mouth 2 (two) times daily. 120 tablet 2   fluticasone-salmeterol (WIXELA INHUB) 100-50 MCG/ACT AEPB Inhale 1 puff into the lungs 2 (two) times daily. 60 each 1   guaiFENesin (MUCINEX) 600 MG 12 hr tablet Take 1 tablet (600 mg total) by mouth 2 (two) times daily. 14 tablet 0   magnesium gluconate (MAGONATE) 500 MG tablet Take 500 mg by mouth at bedtime.     metoprolol succinate (TOPROL-XL) 50 MG 24 hr tablet TAKE 1 TABLET BY MOUTH EVERY DAY 90 tablet 1   traZODone (DESYREL) 50 MG tablet Take 1 tablet (50 mg total) by mouth at bedtime as needed for sleep. 30 tablet 0   No current facility-administered medications on file prior to visit.    Allergies  Allergen Reactions   Olanzapine Other (See Comments)    Parkinson-like symptoms   Tomato (Diagnostic) Other (See Comments)    Tongue break out.    Citrus Rash    Past Medical History:  Diagnosis Date   Bipolar 1 disorder (HCC)    Depression    Hepatitis C    High cholesterol    Hypertension    Seizures (HCC)    UTI (urinary tract infection)     Past Surgical History:  Procedure Laterality Date   APPENDECTOMY     BREAST BIOPSY Right 03/04/2022   stereo bx, mass "X" clip-path pending   TOE AMPUTATION      Family History  Problem Relation Age of Onset   Mental illness  Mother        born in mental hospital   Breast cancer Neg Hx     Social History   Socioeconomic History   Marital status: Legally Separated    Spouse name: Not on file   Number of children: Not on file   Years of education: Not on file   Highest education level: Not on file  Occupational History   Not on file  Tobacco Use   Smoking status: Former    Current packs/day: 0.00    Average packs/day: 1 pack/day for 30.0 years (30.0 ttl pk-yrs)    Types: Cigarettes    Start date: 38    Quit date: 2023    Years since quitting: 2.1    Passive exposure: Past   Smokeless tobacco: Never  Vaping Use   Vaping status: Never Used  Substance  and Sexual Activity   Alcohol use: Never   Drug use: Never   Sexual activity: Not Currently    Partners: Male  Other Topics Concern   Not on file  Social History Narrative   Not on file   Social Drivers of Health   Financial Resource Strain: Low Risk  (08/26/2023)   Overall Financial Resource Strain (CARDIA)    Difficulty of Paying Living Expenses: Not hard at all  Food Insecurity: No Food Insecurity (08/26/2023)   Hunger Vital Sign    Worried About Running Out of Food in the Last Year: Never true    Ran Out of Food in the Last Year: Never true  Transportation Needs: No Transportation Needs (08/26/2023)   PRAPARE - Administrator, Civil Service (Medical): No    Lack of Transportation (Non-Medical): No  Physical Activity: Inactive (08/26/2023)   Exercise Vital Sign    Days of Exercise per Week: 0 days    Minutes of Exercise per Session: 0 min  Stress: No Stress Concern Present (08/26/2023)   [employer] of Occupational Health - Occupational Stress Questionnaire    Feeling of Stress : Not at all  Social Connections: Socially Isolated (08/26/2023)   Social Connection and Isolation Panel [NHANES]    Frequency of Communication with Friends and Family: More than three times a week    Frequency of Social Gatherings with Friends and Family: Once a week    Attends Religious Services: Never    Database administrator or Organizations: No    Attends Banker Meetings: Never    Marital Status: Widowed  Intimate Partner Violence: Not At Risk (08/24/2023)   Humiliation, Afraid, Rape, and Kick questionnaire    Fear of Current or Ex-Partner: No    Emotionally Abused: No    Physically Abused: No    Sexually Abused: No   Review of Systems Sleeps well Appetite is not so good    Objective:   Physical Exam Constitutional:      Appearance: Normal appearance.  Cardiovascular:     Rate and Rhythm: Normal rate and regular rhythm.     Heart sounds: No murmur heard.    No  gallop.  Pulmonary:     Effort: Pulmonary effort is normal.     Breath sounds: Normal breath sounds. No wheezing or rales.  Abdominal:     Palpations: Abdomen is soft.     Tenderness: There is no abdominal tenderness.  Musculoskeletal:     Cervical back: Neck supple.  Lymphadenopathy:     Cervical: No cervical adenopathy.  Neurological:     Mental Status: She  is alert.     Comments: February 14---then 13,---year-- "the 25th" President---Trump, ?Bush 100-?? High school graduate  "I don't like answering questions"  Mild right leg weakness            Assessment & Plan:

## 2023-09-02 NOTE — Patient Instructions (Signed)
Visit Information  Thank you for taking time to visit with me today. Please don't hesitate to contact me if I can be of assistance to you.   Following are the goals we discussed today:   Goals Addressed             This Visit's Progress    Short term rehab options       Activities and task to complete in order to accomplish goals.   LEVELS OF CARE TASK Patient's daughter plans to put placement consideration om hold at this time-will continue to pursue CAP services. Patient's daughter to follow up with referral for CAP services         Our next appointment is by telephone on 09/22/23 at 1pm  Please call the care guide team at [phone] if you need to cancel or reschedule your appointment.   If you are experiencing a Mental Health or Behavioral Health Crisis or need someone to talk to, please call the Suicide and Crisis Lifeline: 988   Patient verbalizes understanding of instructions and care plan provided today and agrees to view in MyChart. Active MyChart status and patient understanding of how to access instructions and care plan via MyChart confirmed with patient.     Telephone follow up appointment with care management team member scheduled for: 09/22/23   Verna Czech, LCSW [place]  [facility], [facility] Licensed Clinical Social Worker Care Coordinator  Direct Dial: [phone]

## 2023-09-02 NOTE — Patient Outreach (Signed)
  Care Coordination   Follow Up Visit Note   09/02/2023 Name: Claudett Bayly MRN: [id number] DOB: [birth year]  Maidie Streight is a 73 y.o. year old female who sees Mort Sawyers, FNP for primary care. I spoke with  Zuriyah Kawasaki's daughter Turkey by phone today.  What matters to the patients health and wellness today?      Goals Addressed             This Visit's Progress    Short term rehab options       Activities and task to complete in order to accomplish goals.   LEVELS OF CARE TASK Patient's daughter plans to put placement consideration om hold at this time-will continue to pursue CAP services. Patient's daughter to follow up with referral for CAP services         SDOH assessments and interventions completed:  No     Care Coordination Interventions:  Yes, provided  Interventions Today    Flowsheet Row Most Recent Value  Chronic Disease   Chronic disease during today's visit Other  [seizure disorder, stroke]  General Interventions   General Interventions Discussed/Reviewed General Interventions Reviewed, Level of Care, Communication with, Doctor Visits, Durable Medical Equipment (DME)  [Return phone call from patient's daughter, discussed follow up with rehab facility and primary care provider]  Doctor Visits Discussed/Reviewed Doctor Visits Discussed, PCP  [discussed challenges with getting to follow up appt timely-confirmed being  able to see a different provider same day-return to rehab  addressed-daughter now re-considering due to improvement in activity]  Durable Medical Equipment (DME) IT sales professional requesting wheelchair, agrees to discuss with primary care doctor]  PCP/Specialist Visits Compliance with follow-up visit  Communication with --  [[facility]-discussed bed offer re-consideration.Confirmed need to obtain insurance auth before pt can be Probation officer will submit auth]  Level of Care Skilled  Nursing Facility, Personal Care Services  [due to improvement in mobility and ability for self care-patient's daughter now re-considering keeping patient home. patient currenlty receives in home aid services 3 hours per day-patient referred for CAP services-status pending]  Education Interventions   Education Provided Provided Education  Provided Verbal Education On Other, Development worker, community  [process of re-admittance to rehab-authorization process discussed]  Mental Health Interventions   Mental Health Discussed/Reviewed --  [patient remains active with [facility] for medication management-caregiver stress acknowledged-emotional support provided]       Follow up plan: Follow up call scheduled for 09/22/23    Encounter Outcome:  Patient Visit Completed

## 2023-09-02 NOTE — Assessment & Plan Note (Signed)
EEG normal but did seem to have seizure activity On depakote 500 bid--will check level

## 2023-09-02 NOTE — Telephone Encounter (Signed)
Pt was scheduled for an appointment today at 1220 with Tabitha. Pt did not show up for this appointment until 1237. [facility] sent me a message asking if the pt could be been or needed to be rescheduled. Per Teams conversation with Wyatt Mage, pt would need to reschedule. Michela Pitcher advised me when she told the pt's daughter this she became upset and stated, "She can't be rescheduled and thats unacceptable." Michela Pitcher asked if I would come up front and speak with the pt's daughter. I then went up front to speak to her and was met with hostility and anger. When I advised the pt's daughter again that she would need to reschedule she told me, "She needs to be seen. She just had a stroke, seizures." I attempted to let her know that we could reschedule her but I was cut off and told, "I will just find her another doctor."   Pt has been rescheduled to see Dr. Alphonsus Sias today at 1400 due to the pt's daughter being upset.  Pt no showed her appointment that was scheduled with Tabitha on 07/22/2023. She also had an appointment on 08/26/2023 with Tabitha but it was rescheduled to today due to Tabitha being out of the office.

## 2023-09-02 NOTE — Assessment & Plan Note (Signed)
Past smoker Using the advair and has ventolin prn

## 2023-09-02 NOTE — Telephone Encounter (Signed)
Pt often late or no show.  Have told daughter countless times about time frame post appt up to ten minutes and also have repeatedly told daughter I will not fill any psychiatric meds, which they keep asking for refills for.   Will see her for f/u

## 2023-09-03 ENCOUNTER — Other Ambulatory Visit: Payer: Self-pay

## 2023-09-03 DIAGNOSIS — I10 Essential (primary) hypertension: Secondary | ICD-10-CM | POA: Diagnosis not present

## 2023-09-03 DIAGNOSIS — I129 Hypertensive chronic kidney disease with stage 1 through stage 4 chronic kidney disease, or unspecified chronic kidney disease: Secondary | ICD-10-CM | POA: Diagnosis not present

## 2023-09-03 DIAGNOSIS — J9601 Acute respiratory failure with hypoxia: Secondary | ICD-10-CM | POA: Diagnosis not present

## 2023-09-03 DIAGNOSIS — E872 Acidosis, unspecified: Secondary | ICD-10-CM | POA: Diagnosis not present

## 2023-09-03 DIAGNOSIS — F17210 Nicotine dependence, cigarettes, uncomplicated: Secondary | ICD-10-CM | POA: Diagnosis not present

## 2023-09-03 DIAGNOSIS — N1831 Chronic kidney disease, stage 3a: Secondary | ICD-10-CM | POA: Diagnosis not present

## 2023-09-03 DIAGNOSIS — J440 Chronic obstructive pulmonary disease with acute lower respiratory infection: Secondary | ICD-10-CM | POA: Diagnosis not present

## 2023-09-03 DIAGNOSIS — Z87440 Personal history of urinary (tract) infections: Secondary | ICD-10-CM | POA: Diagnosis not present

## 2023-09-03 DIAGNOSIS — I700 Atherosclerosis of aorta: Secondary | ICD-10-CM | POA: Diagnosis not present

## 2023-09-03 DIAGNOSIS — Z9181 History of falling: Secondary | ICD-10-CM | POA: Diagnosis not present

## 2023-09-03 DIAGNOSIS — K5730 Diverticulosis of large intestine without perforation or abscess without bleeding: Secondary | ICD-10-CM | POA: Diagnosis not present

## 2023-09-03 DIAGNOSIS — E7800 Pure hypercholesterolemia, unspecified: Secondary | ICD-10-CM | POA: Diagnosis not present

## 2023-09-03 LAB — RENAL FUNCTION PANEL
Albumin: 4.2 g/dL (ref 3.5–5.2)
BUN: 14 mg/dL (ref 6–23)
CO2: 29 meq/L (ref 19–32)
Calcium: 9.3 mg/dL (ref 8.4–10.5)
Chloride: 99 meq/L (ref 96–112)
Creatinine, Ser: 1.24 mg/dL — ABNORMAL HIGH (ref 0.40–1.20)
GFR: 43.58 mL/min — ABNORMAL LOW (ref >60.00)
Glucose, Bld: 132 mg/dL — ABNORMAL HIGH (ref 70–99)
Phosphorus: 2.6 mg/dL (ref 2.3–4.6)
Potassium: 4.1 meq/L (ref 3.5–5.1)
Sodium: 137 meq/L (ref 135–145)

## 2023-09-03 LAB — HEPATIC FUNCTION PANEL
ALT: 9 U/L (ref 0–35)
AST: 12 U/L (ref 0–37)
Albumin: 4.2 g/dL (ref 3.5–5.2)
Alkaline Phosphatase: 30 U/L — ABNORMAL LOW (ref 39–117)
Bilirubin, Direct: 0.1 mg/dL (ref 0.0–0.3)
Total Bilirubin: 0.5 mg/dL (ref 0.2–1.2)
Total Protein: 6.8 g/dL (ref 6.0–8.3)

## 2023-09-03 LAB — VALPROIC ACID LEVEL: Valproic Acid Lvl: 120 mg/L — ABNORMAL HIGH (ref 50.0–100.0)

## 2023-09-03 LAB — CBC
HCT: 38.3 % (ref 36.0–46.0)
Hemoglobin: 12.7 g/dL (ref 12.0–15.0)
MCHC: 33 g/dL (ref 30.0–36.0)
MCV: 89 fL (ref 78.0–100.0)
Platelets: 300 10*3/uL (ref 150.0–400.0)
RBC: 4.31 Mil/uL (ref 3.87–5.11)
RDW: 16 % — ABNORMAL HIGH (ref 11.5–15.5)
WBC: 6.6 10*3/uL (ref 4.0–10.5)

## 2023-09-06 ENCOUNTER — Telehealth: Payer: Self-pay | Admitting: *Deleted

## 2023-09-06 ENCOUNTER — Telehealth: Payer: Self-pay

## 2023-09-06 DIAGNOSIS — E872 Acidosis, unspecified: Secondary | ICD-10-CM | POA: Diagnosis not present

## 2023-09-06 DIAGNOSIS — I10 Essential (primary) hypertension: Secondary | ICD-10-CM | POA: Diagnosis not present

## 2023-09-06 DIAGNOSIS — E7800 Pure hypercholesterolemia, unspecified: Secondary | ICD-10-CM | POA: Diagnosis not present

## 2023-09-06 DIAGNOSIS — J440 Chronic obstructive pulmonary disease with acute lower respiratory infection: Secondary | ICD-10-CM | POA: Diagnosis not present

## 2023-09-06 DIAGNOSIS — I700 Atherosclerosis of aorta: Secondary | ICD-10-CM | POA: Diagnosis not present

## 2023-09-06 DIAGNOSIS — K5730 Diverticulosis of large intestine without perforation or abscess without bleeding: Secondary | ICD-10-CM | POA: Diagnosis not present

## 2023-09-06 DIAGNOSIS — Z87440 Personal history of urinary (tract) infections: Secondary | ICD-10-CM | POA: Diagnosis not present

## 2023-09-06 DIAGNOSIS — I129 Hypertensive chronic kidney disease with stage 1 through stage 4 chronic kidney disease, or unspecified chronic kidney disease: Secondary | ICD-10-CM | POA: Diagnosis not present

## 2023-09-06 DIAGNOSIS — N1831 Chronic kidney disease, stage 3a: Secondary | ICD-10-CM | POA: Diagnosis not present

## 2023-09-06 DIAGNOSIS — J9601 Acute respiratory failure with hypoxia: Secondary | ICD-10-CM | POA: Diagnosis not present

## 2023-09-06 DIAGNOSIS — F17210 Nicotine dependence, cigarettes, uncomplicated: Secondary | ICD-10-CM | POA: Diagnosis not present

## 2023-09-06 DIAGNOSIS — Z9181 History of falling: Secondary | ICD-10-CM | POA: Diagnosis not present

## 2023-09-06 MED ORDER — DIVALPROEX SODIUM 250 MG PO DR TAB: DELAYED_RELEASE_TABLET | ORAL | 1 refills | Status: DC

## 2023-09-06 NOTE — Patient Instructions (Signed)
 Visit Information  Thank you for taking time to visit with me today. Please don't hesitate to contact me if I can be of assistance to you.   Following are the goals we discussed today:   Goals Addressed             This Visit's Progress    COMPLETED: Short term rehab options       Activities and task to complete in order to accomplish goals.   LEVELS OF CARE TASK Patient's daughter declined bed offer for [facility]-confirmed possible plan to move out of state. Patient's daughter to request new prescription for Depakote due to decrease in dosage Patient to continue to receive [facility] services through [place](  PT) and private duty care-has 18 hours per month of personal care services-3 hours 4 days a week and 2 hours 3 days a week-daughter also applying for CAP/DA waiver]         If you are experiencing a Mental Health or Behavioral Health Crisis or need someone to talk to, please call 911   Patient verbalizes understanding of instructions and care plan provided today and agrees to view in MyChart. Active MyChart status and patient understanding of how to access instructions and care plan via MyChart confirmed with patient.     No further follow up required: patient to contact this Child psychotherapist with any additional community resource needs.  Kathyleen Radice, LCSW [place]  [facility], [facility] Licensed Clinical Social Worker Care Coordinator  Direct Dial: [phone]

## 2023-09-06 NOTE — Patient Outreach (Addendum)
  Care Coordination   Follow Up Visit Note   09/06/2023 Name: Shelby Jordan MRN: [id number] DOB: 1950/10/05  Shelby Jordan is a 73 y.o. year old female who sees Mort Sawyers, FNP for primary care. I spoke with  Shelby Jordan's daughter by phone today.  What matters to the patients health and wellness today? Patient's daughter declined bed offer at [facility]. Planing for possible return to [place]. Currently receives [facility] services through [place] and personal care services through [facility] care. Patient's daughter also applying for CAP services.   Goals Addressed             This Visit's Progress    COMPLETED: Short term rehab options       Activities and task to complete in order to accomplish goals.   LEVELS OF CARE TASK Patient's daughter declined bed offer for [facility]-confirmed possible plan to move out of state. Patient's daughter to request new prescription for Depakote due to decrease in dosage Patient to continue to receive [facility] services through [place](  PT) and private duty care-has 18 hours per month of personal care services-3 hours 4 days a week and 2 hours 3 days a week-daughter also applying for CAP/DA waiver]          SDOH assessments and interventions completed:  No     Care Coordination Interventions:  Yes, provided  Interventions Today    Flowsheet Row Most Recent Value  Chronic Disease   Chronic disease during today's visit Other  [seizure disorder-stroke]  General Interventions   General Interventions Discussed/Reviewed General Interventions Reviewed, Level of Care, Communication with  [followd up with patient's daughter regarding placement needs-confirmed insurance auth received for SNF stay-daughter declined bed offer at this time stating plan to return to [facility] with --  [facility] admissions director-confirmed that authorization received for SNF care from inusrance company]  Level of Care Skilled  Nursing Facility, Personal Care Services  [Daughter has declined bed offer at [name]- has 18 hours per month of personal care services-3 hours 4 days a week and 2 hours 3 days a week-daughter currently applying for CAP/DA waiver)]  Pharmacy Interventions   Pharmacy Dicussed/Reviewed Pharmacy Topics Discussed  [daughter states that patient's Depakote was decreased to 250mg -daugter requesting another prescription-CSW encouraged to contact patient's provider to complete this request]       Follow up plan: No further intervention required.   Encounter Outcome:  Patient Visit Completed

## 2023-09-06 NOTE — Telephone Encounter (Signed)
 Rx sent electronically. She stated at the OV last week that she was almost out of medication.

## 2023-09-06 NOTE — Telephone Encounter (Signed)
 Will forward to Dr Alphonsus Sias to approve as her PCP stated it needed to come from psychiatrist.

## 2023-09-06 NOTE — Telephone Encounter (Signed)
 Spoke to pt's daughter. She said they found the medication at home.

## 2023-09-06 NOTE — Addendum Note (Signed)
 Addended by: Eual Fines on: 09/06/2023 03:59 PM   Modules accepted: Orders

## 2023-09-06 NOTE — Telephone Encounter (Signed)
 Copied from CRM [id number]. Topic: Clinical - Prescription Issue >> Sep 06, 2023 11:44 AM Denese Killings wrote: Reason for CRM: Shelby Jordan wants to speak back with [facility] send divalproex (DEPAKOTE) 250 MG DR tablet sent to [facility]. She states that patient doesn't have this medication.

## 2023-09-10 ENCOUNTER — Telehealth: Payer: Self-pay | Admitting: *Deleted

## 2023-09-10 DIAGNOSIS — E872 Acidosis, unspecified: Secondary | ICD-10-CM | POA: Diagnosis not present

## 2023-09-10 DIAGNOSIS — J9601 Acute respiratory failure with hypoxia: Secondary | ICD-10-CM | POA: Diagnosis not present

## 2023-09-10 DIAGNOSIS — N1831 Chronic kidney disease, stage 3a: Secondary | ICD-10-CM | POA: Diagnosis not present

## 2023-09-10 DIAGNOSIS — J440 Chronic obstructive pulmonary disease with acute lower respiratory infection: Secondary | ICD-10-CM | POA: Diagnosis not present

## 2023-09-10 DIAGNOSIS — Z87440 Personal history of urinary (tract) infections: Secondary | ICD-10-CM | POA: Diagnosis not present

## 2023-09-10 DIAGNOSIS — F17210 Nicotine dependence, cigarettes, uncomplicated: Secondary | ICD-10-CM | POA: Diagnosis not present

## 2023-09-10 DIAGNOSIS — I10 Essential (primary) hypertension: Secondary | ICD-10-CM | POA: Diagnosis not present

## 2023-09-10 DIAGNOSIS — K5730 Diverticulosis of large intestine without perforation or abscess without bleeding: Secondary | ICD-10-CM | POA: Diagnosis not present

## 2023-09-10 DIAGNOSIS — Z9181 History of falling: Secondary | ICD-10-CM | POA: Diagnosis not present

## 2023-09-10 DIAGNOSIS — I700 Atherosclerosis of aorta: Secondary | ICD-10-CM | POA: Diagnosis not present

## 2023-09-10 DIAGNOSIS — I129 Hypertensive chronic kidney disease with stage 1 through stage 4 chronic kidney disease, or unspecified chronic kidney disease: Secondary | ICD-10-CM | POA: Diagnosis not present

## 2023-09-10 DIAGNOSIS — E7800 Pure hypercholesterolemia, unspecified: Secondary | ICD-10-CM | POA: Diagnosis not present

## 2023-09-10 NOTE — Patient Outreach (Addendum)
 Care Coordination   Follow Up Visit Note   09/30/2023 Name: Shelby Jordan MRN: [id number] DOB: 01/08/1951  Shelby Jordan is a 73 y.o. year old female who sees Mort Sawyers, FNP for primary care. I spoke with  Shelby Jordan by phone today.  What matters to the patients health and wellness today?  Enhanced in home care services    Goals Addressed             This Visit's Progress    Enhanced in home care       Activities and task to complete in order to accomplish goals.   TASK TO ACCOMPLISH GOAL CAP services packet received from the Department of Health and Human Services Patient's Jordan to have patient to sign required consent forms and provide all forms to patient's provider to complete and send back to the Department of Health and Human Services for processing         SDOH assessments and interventions completed:  No     Care Coordination Interventions:  Yes, provided  Interventions Today    Flowsheet Row Most Recent Value  Chronic Disease   Chronic disease during today's visit Other  [seizure disorder]  General Interventions   General Interventions Discussed/Reviewed General Interventions Reviewed, [facility], Level of Care  [follow up phone call from patient's Jordan-confirms receiving paperwork from the CAP program]  Education Interventions   Education Provided Provided Education  Provided Verbal Education On [employer] that initial paperwork  for assessment for CAP services received-confirmed that pt will need to sogn the consents included in the paperwork -signed paperwork will then need to be provided to PCP to complete-encouraged Jordan to [facility] copies]       Follow up plan: No further intervention required.   Encounter Outcome:  Patient Visit Completed

## 2023-09-10 NOTE — Patient Instructions (Signed)
 Visit Information  Thank you for taking time to visit with me today. Please don't hesitate to contact me if I can be of assistance to you.   Following are the goals we discussed today:   Goals Addressed             This Visit's Progress    Enhanced in home care       Activities and task to complete in order to accomplish goals.   TASK TO ACCOMPLISH GOAL CAP services packet received from the Department of Health and Human Services Patient's daughter to have patient to sign required consent forms and provide all forms to patient's provider to complete and send back to the Department of Health and Human Services for processing         If you are experiencing a Mental Health or Behavioral Health Crisis or need someone to talk to, please call 911   Patient verbalizes understanding of instructions and care plan provided today and agrees to view in MyChart. Active MyChart status and patient understanding of how to access instructions and care plan via MyChart confirmed with patient.     No further follow up required: patient's daughter to contact this Child psychotherapist with any additional community resource needs.   Leontine Radman, LCSW [place]  [facility], [facility] Licensed Clinical Social Worker Care Coordinator  Direct Dial: [phone]

## 2023-09-10 NOTE — Patient Outreach (Signed)
 VM received to contact patient's daughter. Daughter did not answer, VM left for a return call.   Bennetta Rudden, LCSW [place]  [facility], [facility] Licensed Clinical Social Worker Care Coordinator  Direct Dial: [phone]

## 2023-09-13 ENCOUNTER — Telehealth: Payer: Self-pay | Admitting: Family

## 2023-09-13 NOTE — Telephone Encounter (Signed)
 Forms have been placed in Tabitha's inbox.

## 2023-09-13 NOTE — Telephone Encounter (Signed)
 Patient dropped off document  [facility] , to be filled out by provider. Patient requested to send it back via Call Patient to pick up within 5-days. Document is located in providers tray at front office.Please advise at [facility] [phone]   She isn't sure if [place] or Alfonse Alpers will fill out the ppw. Placed in [place] box up front.

## 2023-09-14 ENCOUNTER — Other Ambulatory Visit: Payer: Self-pay | Admitting: Family

## 2023-09-14 DIAGNOSIS — Z87440 Personal history of urinary (tract) infections: Secondary | ICD-10-CM | POA: Diagnosis not present

## 2023-09-14 DIAGNOSIS — I700 Atherosclerosis of aorta: Secondary | ICD-10-CM | POA: Diagnosis not present

## 2023-09-14 DIAGNOSIS — I129 Hypertensive chronic kidney disease with stage 1 through stage 4 chronic kidney disease, or unspecified chronic kidney disease: Secondary | ICD-10-CM | POA: Diagnosis not present

## 2023-09-14 DIAGNOSIS — I10 Essential (primary) hypertension: Secondary | ICD-10-CM

## 2023-09-14 DIAGNOSIS — E872 Acidosis, unspecified: Secondary | ICD-10-CM | POA: Diagnosis not present

## 2023-09-14 DIAGNOSIS — J440 Chronic obstructive pulmonary disease with acute lower respiratory infection: Secondary | ICD-10-CM | POA: Diagnosis not present

## 2023-09-14 DIAGNOSIS — F17210 Nicotine dependence, cigarettes, uncomplicated: Secondary | ICD-10-CM | POA: Diagnosis not present

## 2023-09-14 DIAGNOSIS — N1831 Chronic kidney disease, stage 3a: Secondary | ICD-10-CM | POA: Diagnosis not present

## 2023-09-14 DIAGNOSIS — E7800 Pure hypercholesterolemia, unspecified: Secondary | ICD-10-CM | POA: Diagnosis not present

## 2023-09-14 DIAGNOSIS — K5730 Diverticulosis of large intestine without perforation or abscess without bleeding: Secondary | ICD-10-CM | POA: Diagnosis not present

## 2023-09-14 DIAGNOSIS — J9601 Acute respiratory failure with hypoxia: Secondary | ICD-10-CM | POA: Diagnosis not present

## 2023-09-14 DIAGNOSIS — Z9181 History of falling: Secondary | ICD-10-CM | POA: Diagnosis not present

## 2023-09-16 DIAGNOSIS — J440 Chronic obstructive pulmonary disease with acute lower respiratory infection: Secondary | ICD-10-CM | POA: Diagnosis not present

## 2023-09-16 DIAGNOSIS — I129 Hypertensive chronic kidney disease with stage 1 through stage 4 chronic kidney disease, or unspecified chronic kidney disease: Secondary | ICD-10-CM | POA: Diagnosis not present

## 2023-09-16 DIAGNOSIS — Z9181 History of falling: Secondary | ICD-10-CM | POA: Diagnosis not present

## 2023-09-16 DIAGNOSIS — K5730 Diverticulosis of large intestine without perforation or abscess without bleeding: Secondary | ICD-10-CM | POA: Diagnosis not present

## 2023-09-16 DIAGNOSIS — N1831 Chronic kidney disease, stage 3a: Secondary | ICD-10-CM | POA: Diagnosis not present

## 2023-09-16 DIAGNOSIS — I10 Essential (primary) hypertension: Secondary | ICD-10-CM | POA: Diagnosis not present

## 2023-09-16 DIAGNOSIS — E7800 Pure hypercholesterolemia, unspecified: Secondary | ICD-10-CM | POA: Diagnosis not present

## 2023-09-16 DIAGNOSIS — J9601 Acute respiratory failure with hypoxia: Secondary | ICD-10-CM | POA: Diagnosis not present

## 2023-09-16 DIAGNOSIS — F17210 Nicotine dependence, cigarettes, uncomplicated: Secondary | ICD-10-CM | POA: Diagnosis not present

## 2023-09-16 DIAGNOSIS — E872 Acidosis, unspecified: Secondary | ICD-10-CM | POA: Diagnosis not present

## 2023-09-16 DIAGNOSIS — Z87440 Personal history of urinary (tract) infections: Secondary | ICD-10-CM | POA: Diagnosis not present

## 2023-09-16 DIAGNOSIS — Z0279 Encounter for issue of other medical certificate: Secondary | ICD-10-CM

## 2023-09-16 DIAGNOSIS — I700 Atherosclerosis of aorta: Secondary | ICD-10-CM | POA: Diagnosis not present

## 2023-09-16 NOTE — Telephone Encounter (Signed)
 Forms have been completed besides fill in areas. In outbox

## 2023-09-16 NOTE — Telephone Encounter (Signed)
 Forms have been faxed in. Pt's daughter is aware.

## 2023-09-22 ENCOUNTER — Encounter: Payer: 59 | Admitting: *Deleted

## 2023-09-22 DIAGNOSIS — I129 Hypertensive chronic kidney disease with stage 1 through stage 4 chronic kidney disease, or unspecified chronic kidney disease: Secondary | ICD-10-CM | POA: Diagnosis not present

## 2023-09-22 DIAGNOSIS — N1831 Chronic kidney disease, stage 3a: Secondary | ICD-10-CM | POA: Diagnosis not present

## 2023-09-22 DIAGNOSIS — J9601 Acute respiratory failure with hypoxia: Secondary | ICD-10-CM | POA: Diagnosis not present

## 2023-09-22 DIAGNOSIS — Z87440 Personal history of urinary (tract) infections: Secondary | ICD-10-CM | POA: Diagnosis not present

## 2023-09-22 DIAGNOSIS — I700 Atherosclerosis of aorta: Secondary | ICD-10-CM | POA: Diagnosis not present

## 2023-09-22 DIAGNOSIS — I10 Essential (primary) hypertension: Secondary | ICD-10-CM | POA: Diagnosis not present

## 2023-09-22 DIAGNOSIS — K5730 Diverticulosis of large intestine without perforation or abscess without bleeding: Secondary | ICD-10-CM | POA: Diagnosis not present

## 2023-09-22 DIAGNOSIS — E872 Acidosis, unspecified: Secondary | ICD-10-CM | POA: Diagnosis not present

## 2023-09-22 DIAGNOSIS — J440 Chronic obstructive pulmonary disease with acute lower respiratory infection: Secondary | ICD-10-CM | POA: Diagnosis not present

## 2023-09-22 DIAGNOSIS — Z9181 History of falling: Secondary | ICD-10-CM | POA: Diagnosis not present

## 2023-09-22 DIAGNOSIS — F17210 Nicotine dependence, cigarettes, uncomplicated: Secondary | ICD-10-CM | POA: Diagnosis not present

## 2023-09-22 DIAGNOSIS — E7800 Pure hypercholesterolemia, unspecified: Secondary | ICD-10-CM | POA: Diagnosis not present

## 2023-09-29 ENCOUNTER — Ambulatory Visit: Payer: Self-pay | Admitting: *Deleted

## 2023-09-29 ENCOUNTER — Telehealth: Payer: Self-pay

## 2023-09-29 NOTE — Telephone Encounter (Signed)
 Copied from CRM [id number]. Topic: Clinical - Home Health Verbal Orders >> Sep 29, 2023 11:49 AM Orinda Kenner C wrote: Caller/Agency: Mel physical therapist [phone] from [facility] needs a verbal order to continue physical therapy for once a week for four weeks. Please advise and call back.

## 2023-09-29 NOTE — Telephone Encounter (Signed)
 Verbal orders have been given to Mel. Nothing further was needed.

## 2023-09-30 NOTE — Patient Outreach (Signed)
 Care Coordination   Follow Up Visit Note   09/30/2023 late entry Name: Shelby Jordan MRN: [id number] DOB: 01-30-1951  Shelby Jordan is a 73 y.o. year old female who sees Mort Sawyers, FNP for primary care. I spoke with  [name]'s daughter by phone on 09/29/23.  What matters to the patients health and wellness today?  Referral to the [facility].    Goals Addressed             This Visit's Progress    Enhanced in home care       Activities and task to complete in order to accomplish goals.   TASK TO ACCOMPLISH GOAL CAP services packet received from the Department of Health and Human Services Patient's daughter has completed consent forms and have provided all forms to patient's provider   Completed forms re-faxed to the Department of Health and Human Services for processing         SDOH assessments and interventions completed:  No     Care Coordination Interventions:  Yes, provided  Interventions Today    Flowsheet Row Most Recent Value  Chronic Disease   Chronic disease during today's visit Other  [seizure disorder]  General Interventions   General Interventions Discussed/Reviewed General Interventions Reviewed, Community Resources, Level of Care  [follow up with patient's daugher regarding CAP services referral for additonal in home care]  Level of Care Personal Care Services  [patient currently receives personal care services, however is interested in CAP for additional community based services-referral form re-faxed to the Dept of Human Services]  Education Interventions   Education Provided Provided Education  Provided Verbal Education On [facility]  [CAP referral process]  Mental Health Interventions   Mental Health Discussed/Reviewed Mental Health Discussed  [Patient active with [facility] to address her mental health needs]       Follow up plan: Follow up call scheduled for 10/14/23    Encounter Outcome:  Patient Visit  Completed

## 2023-09-30 NOTE — Patient Instructions (Signed)
 Visit Information  Thank you for taking time to visit with me today. Please don't hesitate to contact me if I can be of assistance to you.   Following are the goals we discussed today:   Goals Addressed             This Visit's Progress    Enhanced in home care       Activities and task to complete in order to accomplish goals.   TASK TO ACCOMPLISH GOAL CAP services packet received from the Department of Health and Human Services Patient's daughter has completed consent forms and have provided all forms to patient's provider   Completed forms re-faxed to the Department of Health and Human Services for processing         Our next appointment is by telephone on 10/14/23 at 11am  Please call the care guide team at [phone] if you need to cancel or reschedule your appointment.   If you are experiencing a Mental Health or Behavioral Health Crisis or need someone to talk to, please call the Suicide and Crisis Lifeline: 988   Patient verbalizes understanding of instructions and care plan provided today and agrees to view in MyChart. Active MyChart status and patient understanding of how to access instructions and care plan via MyChart confirmed with patient.     Telephone follow up appointment with care management team member scheduled for: 10/14/23  Verna Czech, LCSW [place]  [facility], [facility] Licensed Clinical Social Worker Care Coordinator  Direct Dial: [phone]

## 2023-10-01 DIAGNOSIS — E872 Acidosis, unspecified: Secondary | ICD-10-CM | POA: Diagnosis not present

## 2023-10-01 DIAGNOSIS — I129 Hypertensive chronic kidney disease with stage 1 through stage 4 chronic kidney disease, or unspecified chronic kidney disease: Secondary | ICD-10-CM | POA: Diagnosis not present

## 2023-10-01 DIAGNOSIS — K5730 Diverticulosis of large intestine without perforation or abscess without bleeding: Secondary | ICD-10-CM | POA: Diagnosis not present

## 2023-10-01 DIAGNOSIS — Z9181 History of falling: Secondary | ICD-10-CM | POA: Diagnosis not present

## 2023-10-01 DIAGNOSIS — F17210 Nicotine dependence, cigarettes, uncomplicated: Secondary | ICD-10-CM | POA: Diagnosis not present

## 2023-10-01 DIAGNOSIS — E7800 Pure hypercholesterolemia, unspecified: Secondary | ICD-10-CM | POA: Diagnosis not present

## 2023-10-01 DIAGNOSIS — J440 Chronic obstructive pulmonary disease with acute lower respiratory infection: Secondary | ICD-10-CM | POA: Diagnosis not present

## 2023-10-01 DIAGNOSIS — I700 Atherosclerosis of aorta: Secondary | ICD-10-CM | POA: Diagnosis not present

## 2023-10-01 DIAGNOSIS — J9601 Acute respiratory failure with hypoxia: Secondary | ICD-10-CM | POA: Diagnosis not present

## 2023-10-01 DIAGNOSIS — I10 Essential (primary) hypertension: Secondary | ICD-10-CM | POA: Diagnosis not present

## 2023-10-01 DIAGNOSIS — N1831 Chronic kidney disease, stage 3a: Secondary | ICD-10-CM | POA: Diagnosis not present

## 2023-10-01 DIAGNOSIS — Z87440 Personal history of urinary (tract) infections: Secondary | ICD-10-CM | POA: Diagnosis not present

## 2023-10-08 ENCOUNTER — Encounter: Payer: Self-pay | Admitting: Internal Medicine

## 2023-10-08 ENCOUNTER — Ambulatory Visit: Payer: 59 | Admitting: Internal Medicine

## 2023-10-08 VITALS — BP 112/80 | HR 61 | Ht 64.0 in | Wt 192.0 lb

## 2023-10-08 DIAGNOSIS — Z87440 Personal history of urinary (tract) infections: Secondary | ICD-10-CM | POA: Diagnosis not present

## 2023-10-08 DIAGNOSIS — J449 Chronic obstructive pulmonary disease, unspecified: Secondary | ICD-10-CM | POA: Diagnosis not present

## 2023-10-08 DIAGNOSIS — E7800 Pure hypercholesterolemia, unspecified: Secondary | ICD-10-CM | POA: Diagnosis not present

## 2023-10-08 DIAGNOSIS — I129 Hypertensive chronic kidney disease with stage 1 through stage 4 chronic kidney disease, or unspecified chronic kidney disease: Secondary | ICD-10-CM | POA: Diagnosis not present

## 2023-10-08 DIAGNOSIS — N1831 Chronic kidney disease, stage 3a: Secondary | ICD-10-CM | POA: Diagnosis not present

## 2023-10-08 DIAGNOSIS — E872 Acidosis, unspecified: Secondary | ICD-10-CM | POA: Diagnosis not present

## 2023-10-08 DIAGNOSIS — N1832 Chronic kidney disease, stage 3b: Secondary | ICD-10-CM

## 2023-10-08 DIAGNOSIS — J9601 Acute respiratory failure with hypoxia: Secondary | ICD-10-CM | POA: Diagnosis not present

## 2023-10-08 DIAGNOSIS — G20C Parkinsonism, unspecified: Secondary | ICD-10-CM | POA: Diagnosis not present

## 2023-10-08 DIAGNOSIS — I700 Atherosclerosis of aorta: Secondary | ICD-10-CM | POA: Diagnosis not present

## 2023-10-08 DIAGNOSIS — F319 Bipolar disorder, unspecified: Secondary | ICD-10-CM

## 2023-10-08 DIAGNOSIS — I10 Essential (primary) hypertension: Secondary | ICD-10-CM | POA: Diagnosis not present

## 2023-10-08 DIAGNOSIS — J440 Chronic obstructive pulmonary disease with acute lower respiratory infection: Secondary | ICD-10-CM | POA: Diagnosis not present

## 2023-10-08 DIAGNOSIS — K5730 Diverticulosis of large intestine without perforation or abscess without bleeding: Secondary | ICD-10-CM | POA: Diagnosis not present

## 2023-10-08 DIAGNOSIS — Z9181 History of falling: Secondary | ICD-10-CM | POA: Diagnosis not present

## 2023-10-08 DIAGNOSIS — F17210 Nicotine dependence, cigarettes, uncomplicated: Secondary | ICD-10-CM | POA: Diagnosis not present

## 2023-10-08 LAB — HEMOGLOBIN A1C: Hgb A1c MFr Bld: 5.8 % (ref 4.6–6.5)

## 2023-10-08 LAB — CBC
HCT: 40 % (ref 36.0–46.0)
Hemoglobin: 13.5 g/dL (ref 12.0–15.0)
MCHC: 33.7 g/dL (ref 30.0–36.0)
MCV: 88.7 fl (ref 78.0–100.0)
Platelets: 223 10*3/uL (ref 150.0–400.0)
RBC: 4.51 Mil/uL (ref 3.87–5.11)
RDW: 17.1 % — ABNORMAL HIGH (ref 11.5–15.5)
WBC: 5.3 10*3/uL (ref 4.0–10.5)

## 2023-10-08 LAB — RENAL FUNCTION PANEL
Albumin: 4.4 g/dL (ref 3.5–5.2)
BUN: 16 mg/dL (ref 6–23)
CO2: 29 meq/L (ref 19–32)
Calcium: 9.6 mg/dL (ref 8.4–10.5)
Chloride: 101 meq/L (ref 96–112)
Creatinine, Ser: 1.05 mg/dL (ref 0.40–1.20)
GFR: 53.18 mL/min — ABNORMAL LOW (ref >60.00)
Glucose, Bld: 107 mg/dL — ABNORMAL HIGH (ref 70–99)
Phosphorus: 3.1 mg/dL (ref 2.3–4.6)
Potassium: 4.2 meq/L (ref 3.5–5.1)
Sodium: 138 meq/L (ref 135–145)

## 2023-10-08 LAB — HEPATIC FUNCTION PANEL
ALT: 9 U/L (ref 0–35)
AST: 11 U/L (ref 0–37)
Albumin: 4.4 g/dL (ref 3.5–5.2)
Alkaline Phosphatase: 34 U/L — ABNORMAL LOW (ref 39–117)
Bilirubin, Direct: 0.1 mg/dL (ref 0.0–0.3)
Total Bilirubin: 0.5 mg/dL (ref 0.2–1.2)
Total Protein: 6.9 g/dL (ref 6.0–8.3)

## 2023-10-08 NOTE — Assessment & Plan Note (Signed)
 Is due to see neuro Certainly concern for Lewy body dementia with her cognitive changes Could be from the olanzapine

## 2023-10-08 NOTE — Assessment & Plan Note (Signed)
 Has been fairly stable Would consider nephrology--but things are complicated so hold off for now (and GFR stable over years)

## 2023-10-08 NOTE — Assessment & Plan Note (Signed)
 Stable now since over the flu  Wixela bid and prn albuterol

## 2023-10-08 NOTE — Assessment & Plan Note (Signed)
 Is on depakote now Off olanzapine due to possible side effects  No mania  Psychomotor retardation seems more neurologic---not depression

## 2023-10-08 NOTE — Progress Notes (Signed)
 Subjective:    Patient ID: Shelby Jordan, female    DOB: [birth year], 73 y.o.   MRN: [id number]  HPI Here with daughter for follow up  Doing okay Daughter did cut back on the depakote as discussed  Breathing is okay Seems resolved from the flu Does take the wixela bid--and albuterol regularly  Has been constipated --no BM in a week Appetite is variable--not great Has lost some weight--but same as 2 years ago Now off olanzapine  No seizures  Reviewed CKD---they weren't aware GFR 30-45 over the past couple of years  Did see new psychiatrist--doing video visits (in [place])  Daughter notes worsening cognition--feels she has dementia  Current Outpatient Medications on File Prior to Visit  Medication Sig Dispense Refill   albuterol (VENTOLIN HFA) 108 (90 Base) MCG/ACT inhaler Inhale 2 puffs into the lungs every 4 (four) hours as needed for shortness of breath. 18 g 1   amLODipine (NORVASC) 5 MG tablet TAKE 1 TABLET (5 MG TOTAL) BY MOUTH DAILY. 90 tablet 3   ammonium lactate (AMLACTIN DAILY) 12 % lotion Apply 1 Application topically as needed. 400 g 0   divalproex (DEPAKOTE) 250 MG DR tablet Take 250mg  in AM and 500mg  in PM 90 tablet 1   fluticasone-salmeterol (WIXELA INHUB) 100-50 MCG/ACT AEPB Inhale 1 puff into the lungs 2 (two) times daily. 60 each 1   guaiFENesin (MUCINEX) 600 MG 12 hr tablet Take 1 tablet (600 mg total) by mouth 2 (two) times daily. 14 tablet 0   magnesium gluconate (MAGONATE) 500 MG tablet Take 500 mg by mouth at bedtime.     metoprolol succinate (TOPROL-XL) 50 MG 24 hr tablet TAKE 1 TABLET BY MOUTH EVERY DAY 90 tablet 1   traZODone (DESYREL) 50 MG tablet Take 1 tablet (50 mg total) by mouth at bedtime as needed for sleep. 30 tablet 0   No current facility-administered medications on file prior to visit.    Allergies  Allergen Reactions   Olanzapine Other (See Comments)    Parkinson-like symptoms   Tomato (Diagnostic) Other (See Comments)     Tongue break out.    Citrus Rash    Past Medical History:  Diagnosis Date   Bipolar 1 disorder (HCC)    Depression    Hepatitis C    High cholesterol    Hypertension    Seizures (HCC)    UTI (urinary tract infection)     Past Surgical History:  Procedure Laterality Date   APPENDECTOMY     BREAST BIOPSY Right 03/04/2022   stereo bx, mass "X" clip-path pending   TOE AMPUTATION      Family History  Problem Relation Age of Onset   Mental illness Mother        born in mental hospital   Breast cancer Neg Hx     Social History   Socioeconomic History   Marital status: Legally Separated    Spouse name: Not on file   Number of children: Not on file   Years of education: Not on file   Highest education level: Not on file  Occupational History   Not on file  Tobacco Use   Smoking status: Former    Current packs/day: 0.00    Average packs/day: 1 pack/day for 30.0 years (30.0 ttl pk-yrs)    Types: Cigarettes    Start date: 54    Quit date: 2023    Years since quitting: 2.2    Passive exposure: Past   Smokeless tobacco: Never  Vaping Use   Vaping status: Never Used  Substance and Sexual Activity   Alcohol use: Never   Drug use: Never   Sexual activity: Not Currently    Partners: Male  Other Topics Concern   Not on file  Social History Narrative   Not on file   Social Drivers of Health   Financial Resource Strain: Low Risk  (08/26/2023)   Overall Financial Resource Strain (CARDIA)    Difficulty of Paying Living Expenses: Not hard at all  Food Insecurity: No Food Insecurity (08/26/2023)   Hunger Vital Sign    Worried About Running Out of Food in the Last Year: Never true    Ran Out of Food in the Last Year: Never true  Transportation Needs: No Transportation Needs (08/26/2023)   PRAPARE - Administrator, Civil Service (Medical): No    Lack of Transportation (Non-Medical): No  Physical Activity: Inactive (08/26/2023)   Exercise Vital Sign    Days of  Exercise per Week: 0 days    Minutes of Exercise per Session: 0 min  Stress: No Stress Concern Present (08/26/2023)   [employer] of Occupational Health - Occupational Stress Questionnaire    Feeling of Stress : Not at all  Social Connections: Socially Isolated (08/26/2023)   Social Connection and Isolation Panel [NHANES]    Frequency of Communication with Friends and Family: More than three times a week    Frequency of Social Gatherings with Friends and Family: Once a week    Attends Religious Services: Never    Database administrator or Organizations: No    Attends Banker Meetings: Never    Marital Status: Widowed  Intimate Partner Violence: Not At Risk (08/24/2023)   Humiliation, Afraid, Rape, and Kick questionnaire    Fear of Current or Ex-Partner: No    Emotionally Abused: No    Physically Abused: No    Sexually Abused: No   Review of Systems Still walks with shuffle Sleeps okay---doesn't get up till 2-3PM (but stays up all night watching TV)    Objective:   Physical Exam Constitutional:      General: She is not in acute distress. Cardiovascular:     Rate and Rhythm: Normal rate and regular rhythm.     Heart sounds: No murmur heard.    No gallop.  Pulmonary:     Effort: Pulmonary effort is normal.     Breath sounds: No wheezing or rales.     Comments: Decreased breath sounds but clear Abdominal:     Palpations: Abdomen is soft.     Tenderness: There is no abdominal tenderness.  Musculoskeletal:     Cervical back: Neck supple.  Lymphadenopathy:     Cervical: No cervical adenopathy.  Neurological:     Mental Status: She is alert.     Comments: Shuffling gait Sig bradykinesia            Assessment & Plan:

## 2023-10-09 LAB — VALPROIC ACID LEVEL: Valproic Acid Lvl: 90.1 mg/L (ref 50.0–100.0)

## 2023-10-11 ENCOUNTER — Ambulatory Visit (INDEPENDENT_AMBULATORY_CARE_PROVIDER_SITE_OTHER): Payer: 59 | Admitting: Urology

## 2023-10-11 ENCOUNTER — Telehealth: Payer: Self-pay | Admitting: Family

## 2023-10-11 ENCOUNTER — Encounter: Payer: Self-pay | Admitting: Urology

## 2023-10-11 VITALS — BP 106/57 | HR 59 | Ht 65.0 in | Wt 195.8 lb

## 2023-10-11 DIAGNOSIS — R32 Unspecified urinary incontinence: Secondary | ICD-10-CM

## 2023-10-11 DIAGNOSIS — N3941 Urge incontinence: Secondary | ICD-10-CM | POA: Diagnosis not present

## 2023-10-11 MED ORDER — MIRABEGRON ER 50 MG PO TB24: 50.0000 mg | ORAL_TABLET | Freq: Every day | ORAL | 11 refills | Status: DC

## 2023-10-11 NOTE — Telephone Encounter (Signed)
 Unable to reach patient. Left voicemail to return call to our office.

## 2023-10-11 NOTE — Progress Notes (Signed)
 10/11/2023 3:12 PM   Caden Bigbee [birth year] [id number]  Referring provider: Mort Sawyers, FNP [address] [place],  [place] [address]  No chief complaint on file.   HPI: I was consulted to assess the patient's urinary incontinence.  She has urge incontinence.  She says she has no stress incontinence.  She has moderately severe bedwetting.  She does not wear pads   She voids every hour and cannot hold it for 2 hours.  She gets up 5 times at night.  Does not take a diuretic.  No ankle edema   No hysterectomy   History was a little bit challenging.  I reviewed the medical records and she has had a recent renal ultrasound and negative urine culture     Has urgency incontinence bedwetting and significant nocturia reassess in 6 weeks with examination and cystoscopy on Gemtesa samples and prescription. She may or may not need urodynamics in the future. Get urine culture next time. Check postvoid residual next time    Today Frequency improved.  Much less bedwetting but still some.  Less urge incontinence.  Clinically not infected Pelvic examination she had grade 1 hypermobility the bladder neck and negative cough test with a moderate cough after cystoscopy Cystoscopy: Patient underwent flexible cystoscopy.  Bladder mucosa and trigone were normal.  No cystitis.  No carcinoma.  Urine aspirated and sent for culture    Patient doing moderately better on Gemtesa.  Recheck in 2 months with a postvoid residual and proceed accordingly.  Call if urine culture positive   Today Frequency stable.  Last culture negative.  Culture prior to that was normal as well recent CT scan normal performed January 2025.  Patient has bipolar illness and her daughter is not certain if she has the toileting skills or she is lazy etc.  It is always a bit difficult to communicate or get a clear picture.  She did not leave a urine sample today    PMH: Past Medical History:  Diagnosis Date   Bipolar 1  disorder (HCC)    Depression    Hepatitis C    High cholesterol    Hypertension    Seizures (HCC)    UTI (urinary tract infection)     Surgical History: Past Surgical History:  Procedure Laterality Date   APPENDECTOMY     BREAST BIOPSY Right 03/04/2022   stereo bx, mass "X" clip-path pending   TOE AMPUTATION      Home Medications:  Allergies as of 10/11/2023       Reactions   Olanzapine Other (See Comments)   Parkinson-like symptoms   Tomato (diagnostic) Other (See Comments)   Tongue break out.    Citrus Rash        Medication List        Accurate as of October 11, 2023  3:12 PM. If you have any questions, ask your nurse or doctor.          albuterol 108 (90 Base) MCG/ACT inhaler Commonly known as: VENTOLIN HFA Inhale 2 puffs into the lungs every 4 (four) hours as needed for shortness of breath.   amLODipine 5 MG tablet Commonly known as: NORVASC TAKE 1 TABLET (5 MG TOTAL) BY MOUTH DAILY.   ammonium lactate 12 % lotion Commonly known as: Amlactin Daily Apply 1 Application topically as needed.   divalproex 250 MG DR tablet Commonly known as: Depakote Take 250mg  in AM and 500mg  in PM   fluticasone-salmeterol 100-50 MCG/ACT Aepb Commonly  known as: Wixela Inhub Inhale 1 puff into the lungs 2 (two) times daily.   guaiFENesin 600 MG 12 hr tablet Commonly known as: MUCINEX Take 1 tablet (600 mg total) by mouth 2 (two) times daily.   magnesium gluconate 500 MG tablet Commonly known as: MAGONATE Take 500 mg by mouth at bedtime.   metoprolol succinate 50 MG 24 hr tablet Commonly known as: TOPROL-XL TAKE 1 TABLET BY MOUTH EVERY DAY   traZODone 50 MG tablet Commonly known as: DESYREL Take 1 tablet (50 mg total) by mouth at bedtime as needed for sleep.        Allergies:  Allergies  Allergen Reactions   Olanzapine Other (See Comments)    Parkinson-like symptoms   Tomato (Diagnostic) Other (See Comments)    Tongue break out.    Citrus Rash     Family History: Family History  Problem Relation Age of Onset   Mental illness Mother        born in mental hospital   Breast cancer Neg Hx     Social History:  reports that she quit smoking about 2 years ago. Her smoking use included cigarettes. She started smoking about 32 years ago. She has a 30 pack-year smoking history. She has been exposed to tobacco smoke. She has never used smokeless tobacco. She reports that she does not drink alcohol and does not use drugs.  ROS:                                        Physical Exam: There were no vitals taken for this visit.  Constitutional:  Alert and oriented, No acute distress.   Laboratory Data: Lab Results  Component Value Date   WBC 5.3 10/08/2023   HGB 13.5 10/08/2023   HCT 40.0 10/08/2023   MCV 88.7 10/08/2023   PLT 223.0 10/08/2023    Lab Results  Component Value Date   CREATININE 1.05 10/08/2023    No results found for: "PSA"  No results found for: "TESTOSTERONE"  Lab Results  Component Value Date   HGBA1C 5.8 10/08/2023    Urinalysis    Component Value Date/Time   COLORURINE YELLOW 08/16/2023 0040   APPEARANCEUR CLEAR 08/16/2023 0040   APPEARANCEUR Clear 06/07/2023 1335   LABSPEC 1.012 08/16/2023 0040   PHURINE 7.0 08/16/2023 0040   GLUCOSEU NEGATIVE 08/16/2023 0040   GLUCOSEU NEGATIVE 07/16/2022 1459   HGBUR NEGATIVE 08/16/2023 0040   BILIRUBINUR NEGATIVE 08/16/2023 0040   BILIRUBINUR Negative 06/07/2023 1335   KETONESUR NEGATIVE 08/16/2023 0040   PROTEINUR NEGATIVE 08/16/2023 0040   UROBILINOGEN 0.2 02/11/2023 1440   UROBILINOGEN 0.2 07/16/2022 1459   NITRITE NEGATIVE 08/16/2023 0040   LEUKOCYTESUR NEGATIVE 08/16/2023 0040    Pertinent Imaging:   Assessment & Plan: Reassess in 6 weeks on Myrbetriq 50 mg.  I think we have to have reasonable treatment goals.  Daughter want to know neck step and would be percutaneous tibial nerve stimulation.  I am not going to order  urodynamics.    1. Urinary incontinence, unspecified type (Primary)  - Urinalysis, Complete - BLADDER SCAN AMB NON-IMAGING  2. Urgency incontinence  - Urinalysis, Complete - BLADDER SCAN AMB NON-IMAGING   No follow-ups on file.  Martina Sinner, MD  [facility] [address] [place], [place] [address] [phone]

## 2023-10-11 NOTE — Telephone Encounter (Signed)
 Copied from CRM [id number]. Topic: Clinical - Medical Advice >> Oct 11, 2023  3:39 PM Kathryne Eriksson wrote: Reason for CRM: Requesting Advise >> Oct 11, 2023  3:41 PM Kathryne Eriksson wrote: Shelby Jordan "Daughter" called on behalf on patient states her hands are extremely cold and states she has some concerns with her iron. Requesting for some medical advice. LaShawn call back number is [phone]

## 2023-10-11 NOTE — Telephone Encounter (Signed)
 Spoke with pt's daughter, Flint Melter. States that the pt has been having issues with her hands getting cold. Over the weekend, they got the pt out of the shower and her hands were like "ice." Pt's daughter got a heating pad and put it on her hands and this resolved the issue. Now the pt's hand stay cold all the time. Flint Melter would like to know if Dr. Alphonsus Sias knows what could be happening.

## 2023-10-11 NOTE — Addendum Note (Signed)
 Addended byRanda Lynn on: 10/11/2023 03:36 PM   Modules accepted: Orders

## 2023-10-12 ENCOUNTER — Telehealth: Payer: Self-pay

## 2023-10-12 ENCOUNTER — Ambulatory Visit: Payer: Self-pay

## 2023-10-12 NOTE — Telephone Encounter (Signed)
Spoke to pt's daughter per DPR. 

## 2023-10-12 NOTE — Telephone Encounter (Signed)
 Spoke to pt's daughter and advised pt is to keep doing current dosage of Depakote.

## 2023-10-12 NOTE — Telephone Encounter (Signed)
 Copied from CRM [id number]. Topic: General - Call Back - No Documentation >> Oct 11, 2023  4:43 PM Tiffany S wrote: Reason for CRM: Patient returning call from [facility] please follow up with patient

## 2023-10-12 NOTE — Telephone Encounter (Signed)
 FYI: Pt's Depakote dosage was recently changed due to abnormal labs, daughter would like to verify she should continue with the 250 mg dosage in the AM in lieu of the 500 mg dosage she was taken previously. Pt's Daughter Flint Melter would like a follow up on this.   Chief Complaint: Constipation Symptoms: Denies Frequency: x 2 weeks Pertinent Negatives: Patient denies fever, abd pain, bloating, N/V Disposition: [] ED /[] Urgent Care (no appt availability in office) / [x] Appointment(In office/virtual)/ []  [facility]/ [] Home Care/ [] Refused Recommended Disposition /[] [facility]/ []  Follow-up with PCP Additional Notes: Pt's daughter reports she believes pt has not had a BM for approx 2 weeks. She notes an episode of diarrhea prior to that, denies fever, abd pain, N/V/D. OV scheduled tomorrow AM. This RN educated pt on home care, new-worsening symptoms, when to call back/seek emergent care. Pt verbalized understanding and agrees to plan.    Copied from CRM [id number]. Topic: Clinical - Red Word Triage >> Oct 12, 2023 12:26 PM Gurney Maxin H wrote: [facility] that prompted transfer to Nurse Triage: Fingers turned purple and really cold hasn't had bowel movement in 2 weeks Reason for Disposition  Last bowel movement (BM) > 4 days ago  Answer Assessment - Initial Assessment Questions 1. STOOL PATTERN OR FREQUENCY: "How often do you have a bowel movement (BM)?"  (Normal range: 3 times a day to every 3 days)  "When was your last BM?"       Every other day, hasn't had a BM in 2 weeks 2. STRAINING: "Do you have to strain to have a BM?"      None  5. BLOOD ON STOOLS: "Has there been any blood on the toilet tissue or on the surface of the BM?" If Yes, ask: "When was the last time?"     None  7. CHANGES IN DIET OR HYDRATION: "Have there been any recent changes in your diet?" "How much fluids are you drinking on a daily basis?"  "How much have you had to drink today?"     Increased  fluids  9. LAXATIVES: "Have you been using any stool softeners, laxatives, or enemas?"  If Yes, ask "What, how often, and when was the last time?"     Miralaz yesterday   11. CAUSE: "What do you think is causing the constipation?"        Unknown 12. OTHER SYMPTOMS: "Do you have any other symptoms?" (e.g., abdomen pain, bloating, fever, vomiting)       None  Protocols used: Constipation-A-AH

## 2023-10-13 ENCOUNTER — Ambulatory Visit: Admitting: Internal Medicine

## 2023-10-14 ENCOUNTER — Ambulatory Visit: Payer: Self-pay | Admitting: *Deleted

## 2023-10-14 ENCOUNTER — Encounter: Payer: Self-pay | Admitting: Internal Medicine

## 2023-10-14 ENCOUNTER — Ambulatory Visit (INDEPENDENT_AMBULATORY_CARE_PROVIDER_SITE_OTHER): Admitting: Internal Medicine

## 2023-10-14 VITALS — BP 110/70 | HR 60 | Temp 97.9°F | Ht 65.0 in | Wt 195.0 lb

## 2023-10-14 DIAGNOSIS — R209 Unspecified disturbances of skin sensation: Secondary | ICD-10-CM | POA: Insufficient documentation

## 2023-10-14 DIAGNOSIS — K5900 Constipation, unspecified: Secondary | ICD-10-CM | POA: Diagnosis not present

## 2023-10-14 NOTE — Patient Instructions (Signed)
 Please try miralax (or generic)--1 capful in full glass of water twice a day --till your bowels empty well. Then you can cut back to once a day or even every other day---if they stay moving okay. It is meant to be taken on a regular schedule to keep the bowels moving.

## 2023-10-14 NOTE — Assessment & Plan Note (Signed)
 Not clearly Raynauds No clear medication cause Not really having pain Reassured--not worrisome

## 2023-10-14 NOTE — Patient Instructions (Signed)
 Visit Information  Thank you for taking time to visit with me today. Please don't hesitate to contact me if I can be of assistance to you.   Following are the goals we discussed today:   Goals Addressed             This Visit's Progress    Enhanced in home care          Patient's assessment for the [facility] completed-final determination pending. Patient to continue to follow up with [facility] for mental health follow up Patient to contact this social worker with any additional questions or concerns   Keep all upcoming appointment discussed today Continue with compliance of taking medication prescribed by Doctor Please contact your provider with any new concerns or questions regarding your medical care          If you are experiencing a Mental Health or Behavioral Health Crisis or need someone to talk to, please call the Suicide and Crisis Lifeline: 988   Patient verbalizes understanding of instructions and care plan provided today and agrees to view in MyChart. Active MyChart status and patient understanding of how to access instructions and care plan via MyChart confirmed with patient.     No further follow up required: patient's daughter to contact this Child psychotherapist with any additional community resource needs  [employer]   [facility], [facility] Licensed Clinical Social Geologist, engineering Dial: [phone]

## 2023-10-14 NOTE — Assessment & Plan Note (Signed)
 Recommended daily miralax

## 2023-10-14 NOTE — Progress Notes (Signed)
 Subjective:    Patient ID: Shelby Jordan, female    DOB: [birth year], 73 y.o.   MRN: [id number]  HPI Here with daughter--concerns about her bowels and coldness in her hands  Hands are cold---the entire hand Just recently noticed No pain Some purple in tips of fingers and nailbeds while in shower---improved after warming up after the shower  Feet also Some leg pain--but sporadic. Just at rest  Current Outpatient Medications on File Prior to Visit  Medication Sig Dispense Refill   albuterol (VENTOLIN HFA) 108 (90 Base) MCG/ACT inhaler Inhale 2 puffs into the lungs every 4 (four) hours as needed for shortness of breath. 18 g 1   amLODipine (NORVASC) 5 MG tablet TAKE 1 TABLET (5 MG TOTAL) BY MOUTH DAILY. 90 tablet 3   ammonium lactate (AMLACTIN DAILY) 12 % lotion Apply 1 Application topically as needed. 400 g 0   divalproex (DEPAKOTE) 250 MG DR tablet Take 250mg  in AM and 500mg  in PM 90 tablet 1   fluticasone-salmeterol (WIXELA INHUB) 100-50 MCG/ACT AEPB Inhale 1 puff into the lungs 2 (two) times daily. 60 each 1   guaiFENesin (MUCINEX) 600 MG 12 hr tablet Take 1 tablet (600 mg total) by mouth 2 (two) times daily. 14 tablet 0   magnesium gluconate (MAGONATE) 500 MG tablet Take 500 mg by mouth at bedtime.     metoprolol succinate (TOPROL-XL) 50 MG 24 hr tablet TAKE 1 TABLET BY MOUTH EVERY DAY 90 tablet 1   mirabegron ER (MYRBETRIQ) 50 MG TB24 tablet Take 1 tablet (50 mg total) by mouth daily. 30 tablet 11   traZODone (DESYREL) 50 MG tablet Take 1 tablet (50 mg total) by mouth at bedtime as needed for sleep. 30 tablet 0   No current facility-administered medications on file prior to visit.    Allergies  Allergen Reactions   Olanzapine Other (See Comments)    Parkinson-like symptoms   Tomato (Diagnostic) Other (See Comments)    Tongue break out.    Citrus Rash    Past Medical History:  Diagnosis Date   Bipolar 1 disorder (HCC)    Depression    Hepatitis C    High cholesterol     Hypertension    Seizures (HCC)    UTI (urinary tract infection)     Past Surgical History:  Procedure Laterality Date   APPENDECTOMY     BREAST BIOPSY Right 03/04/2022   stereo bx, mass "X" clip-path pending   TOE AMPUTATION      Family History  Problem Relation Age of Onset   Mental illness Mother        born in mental hospital   Breast cancer Neg Hx     Social History   Socioeconomic History   Marital status: Legally Separated    Spouse name: Not on file   Number of children: Not on file   Years of education: Not on file   Highest education level: Not on file  Occupational History   Not on file  Tobacco Use   Smoking status: Former    Current packs/day: 0.00    Average packs/day: 1 pack/day for 30.0 years (30.0 ttl pk-yrs)    Types: Cigarettes    Start date: 19    Quit date: 2023    Years since quitting: 2.2    Passive exposure: Past   Smokeless tobacco: Never  Vaping Use   Vaping status: Never Used  Substance and Sexual Activity   Alcohol use: Never   Drug  use: Never   Sexual activity: Not Currently    Partners: Male  Other Topics Concern   Not on file  Social History Narrative   Not on file   Social Drivers of Health   Financial Resource Strain: Low Risk  (08/26/2023)   Overall Financial Resource Strain (CARDIA)    Difficulty of Paying Living Expenses: Not hard at all  Food Insecurity: No Food Insecurity (08/26/2023)   Hunger Vital Sign    Worried About Running Out of Food in the Last Year: Never true    Ran Out of Food in the Last Year: Never true  Transportation Needs: No Transportation Needs (08/26/2023)   PRAPARE - Administrator, Civil Service (Medical): No    Lack of Transportation (Non-Medical): No  Physical Activity: Inactive (08/26/2023)   Exercise Vital Sign    Days of Exercise per Week: 0 days    Minutes of Exercise per Session: 0 min  Stress: No Stress Concern Present (08/26/2023)   [employer] of Occupational Health -  Occupational Stress Questionnaire    Feeling of Stress : Not at all  Social Connections: Socially Isolated (08/26/2023)   Social Connection and Isolation Panel [NHANES]    Frequency of Communication with Friends and Family: More than three times a week    Frequency of Social Gatherings with Friends and Family: Once a week    Attends Religious Services: Never    Database administrator or Organizations: No    Attends Banker Meetings: Never    Marital Status: Widowed  Intimate Partner Violence: Not At Risk (08/24/2023)   Humiliation, Afraid, Rape, and Kick questionnaire    Fear of Current or Ex-Partner: No    Emotionally Abused: No    Physically Abused: No    Sexually Abused: No   Review of Systems Some constipation---and appetite is not right No abdominal pain Just started on myrebetriq at urologist    Objective:   Physical Exam Abdominal:     General: There is no distension.     Palpations: Abdomen is soft.     Tenderness: There is no abdominal tenderness. There is no guarding or rebound.  Skin:    Comments: Fingertips mildly cool--hands warm Normal radial pulses  Neurological:     Mental Status: She is alert.            Assessment & Plan:

## 2023-10-14 NOTE — Patient Outreach (Addendum)
 Care Coordination   Follow Up Visit Note   10/14/2023 Name: Shelby Jordan MRN: [id number] DOB: 1950-07-22  Shelby Jordan is a 73 y.o. year old female who sees Karie Schwalbe, MD for primary care. I spoke with  Shelby Jordan by phone today.  What matters to the patients health and wellness today?  Patient's daughter confirms that assessment for the [facility] has been completed-determination pending. Patient also active with [facility] for Mental Health support.  No additional community resource needs identified at this time    Goals Addressed             This Visit's Progress    Enhanced in home care          Patient's assessment for the [facility] Programs completed-final determination pending. Patient to continue to follow up with [facility] for mental health follow up Patient to contact this social worker with any additional questions or concerns   Keep all upcoming appointment discussed today Continue with compliance of taking medication prescribed by Doctor Please contact your provider with any new concerns or questions regarding your medical care         SDOH assessments and interventions completed:  No     Care Coordination Interventions:  Yes, provided  Interventions Today    Flowsheet Row Most Recent Value  Chronic Disease   Chronic disease during today's visit Other  [seizure disorder]  General Interventions   General Interventions Discussed/Reviewed General Interventions Reviewed, [facility], Doctor Visits  [follow up call to patient's daughter, confirmed that assessment for the [employer] [facility] Program was completed last week. Determination pending]  Doctor Visits Discussed/Reviewed PCP  [3/27 to address concerns related to constipation]  Level of Care Personal Care Services  [Continues to receive personal care services-18 hours per month-while CAP services designation is determination]  Mental Health  Interventions   Mental Health Discussed/Reviewed Mental Health Discussed  [Patient continues to be active with [facility]  to address her mental health needs]       Follow up plan: No further intervention required.   Encounter Outcome:  Patient Visit Completed

## 2023-11-01 ENCOUNTER — Encounter (HOSPITAL_COMMUNITY): Payer: Self-pay

## 2023-11-01 ENCOUNTER — Other Ambulatory Visit: Payer: Self-pay

## 2023-11-01 ENCOUNTER — Emergency Department (HOSPITAL_COMMUNITY)
Admission: EM | Admit: 2023-11-01 | Discharge: 2023-11-02 | Discharge status: Against Medical Advice | Attending: Emergency Medicine | Admitting: Emergency Medicine

## 2023-11-01 DIAGNOSIS — I700 Atherosclerosis of aorta: Secondary | ICD-10-CM | POA: Insufficient documentation

## 2023-11-01 DIAGNOSIS — G20C Parkinsonism, unspecified: Secondary | ICD-10-CM | POA: Diagnosis not present

## 2023-11-01 DIAGNOSIS — I10 Essential (primary) hypertension: Secondary | ICD-10-CM | POA: Insufficient documentation

## 2023-11-01 DIAGNOSIS — K5730 Diverticulosis of large intestine without perforation or abscess without bleeding: Secondary | ICD-10-CM | POA: Diagnosis not present

## 2023-11-01 DIAGNOSIS — R4182 Altered mental status, unspecified: Secondary | ICD-10-CM | POA: Diagnosis not present

## 2023-11-01 DIAGNOSIS — R8289 Other abnormal findings on cytological and histological examination of urine: Secondary | ICD-10-CM | POA: Diagnosis present

## 2023-11-01 DIAGNOSIS — Z79899 Other long term (current) drug therapy: Secondary | ICD-10-CM | POA: Insufficient documentation

## 2023-11-01 DIAGNOSIS — K4020 Bilateral inguinal hernia, without obstruction or gangrene, not specified as recurrent: Secondary | ICD-10-CM | POA: Insufficient documentation

## 2023-11-01 DIAGNOSIS — K5900 Constipation, unspecified: Secondary | ICD-10-CM | POA: Insufficient documentation

## 2023-11-01 DIAGNOSIS — J45909 Unspecified asthma, uncomplicated: Secondary | ICD-10-CM | POA: Insufficient documentation

## 2023-11-01 DIAGNOSIS — I6782 Cerebral ischemia: Secondary | ICD-10-CM | POA: Diagnosis not present

## 2023-11-01 LAB — COMPREHENSIVE METABOLIC PANEL WITH GFR
ALT: 15 U/L (ref 0–44)
AST: 17 U/L (ref 15–41)
Albumin: 3.8 g/dL (ref 3.5–5.0)
Alkaline Phosphatase: 33 U/L — ABNORMAL LOW (ref 38–126)
Anion gap: 10 (ref 5–15)
BUN: 15 mg/dL (ref 8–23)
CO2: 27 mmol/L (ref 22–32)
Calcium: 9.7 mg/dL (ref 8.9–10.3)
Chloride: 102 mmol/L (ref 98–111)
Creatinine, Ser: 1.33 mg/dL — ABNORMAL HIGH (ref 0.44–1.00)
GFR, Estimated: 43 mL/min — ABNORMAL LOW (ref >60)
Glucose, Bld: 118 mg/dL — ABNORMAL HIGH (ref 70–99)
Potassium: 4.8 mmol/L (ref 3.5–5.1)
Sodium: 139 mmol/L (ref 135–145)
Total Bilirubin: 0.6 mg/dL (ref 0.0–1.2)
Total Protein: 7.1 g/dL (ref 6.5–8.1)

## 2023-11-01 LAB — CBC
HCT: 39.3 % (ref 36.0–46.0)
Hemoglobin: 12.8 g/dL (ref 12.0–15.0)
MCH: 29.2 pg (ref 26.0–34.0)
MCHC: 32.6 g/dL (ref 30.0–36.0)
MCV: 89.7 fL (ref 80.0–100.0)
Platelets: 258 10*3/uL (ref 150–400)
RBC: 4.38 MIL/uL (ref 3.87–5.11)
RDW: 16.1 % — ABNORMAL HIGH (ref 11.5–15.5)
WBC: 5.1 10*3/uL (ref 4.0–10.5)
nRBC: 0 % (ref 0.0–0.2)

## 2023-11-01 LAB — AMMONIA: Ammonia: 23 umol/L (ref 9–35)

## 2023-11-01 NOTE — ED Notes (Signed)
 Pat no longer wants to wait - left ama will return in the morning with hopes that wait time is shorter

## 2023-11-01 NOTE — ED Triage Notes (Signed)
 Patient lives with family.  Family reports a knot in the center of her chest.  Family also reports difficulty with BM x 2 weeks.  Started miralax 2 days ago.  Family reports increased confusion and that she possible has a UTI.  Patient denies pain to knot on chest.  Denies pain in general.  Patient oriented to self.

## 2023-11-01 NOTE — ED Provider Triage Note (Signed)
 Emergency Medicine Provider Triage Evaluation Note  Shelby Jordan , a 73 y.o. female  was evaluated in triage.  Pt complains of knot in her chest, no sob. Noticed it yesterday. Also here with daughter who expresses concern for increased confusion, concern for UTI. Patient denies urinary symptoms, fevers, nausea, vomiting. She is alert and oriented x2. Denies abdominal pain. Also has been having issues with Bms. Patient in no distress on exam.  Review of Systems  Positive:  Negative:   Physical Exam  BP 132/69   Pulse 63   Temp 98.4 F (36.9 C) (Oral)   Resp 17   Ht 5\' 5"  (1.651 m)   Wt 88.5 kg   SpO2 99%   BMI 32.45 kg/m  Gen:   Awake, no distress   Resp:  Normal effort  MSK:   Moves extremities without difficulty Other:    Medical Decision Making  Medically screening exam initiated at 5:31 PM.  Appropriate orders placed.  Shelby Jordan was informed that the remainder of the evaluation will be completed by another provider, this initial triage assessment does not replace that evaluation, and the importance of remaining in the ED until their evaluation is complete.     Adel Aden, PA-C 11/01/23 1733

## 2023-11-01 NOTE — ED Provider Notes (Signed)
 [place] EMERGENCY DEPARTMENT AT [facility] Provider Note   CSN: [id number] Arrival date & time: 11/01/23  1710     History {Add pertinent medical, surgical, social history, OB history to HPI:1} Chief Complaint  Patient presents with   Altered Mental Status    Shelby Jordan is a 73 y.o. female.   Altered Mental Status Patient presents for***.  Medical history includes HTN, HLD, depression, seizures, bipolar disorder, osteopenia, parkinsonism, asthma.  She has ongoing constipation for the last several weeks.***She is started on MiraLAX.  Family is concerned of increased confusion lately.  Patient is concerned of nonpainful swelling to chest.***     Home Medications Prior to Admission medications   Medication Sig Start Date End Date Taking? Authorizing Provider  albuterol (VENTOLIN HFA) 108 (90 Base) MCG/ACT inhaler Inhale 2 puffs into the lungs every 4 (four) hours as needed for shortness of breath. 08/20/23   Ghimire, Werner Lean, MD  amLODipine (NORVASC) 5 MG tablet TAKE 1 TABLET (5 MG TOTAL) BY MOUTH DAILY. 09/14/23 09/13/24  Mort Sawyers, FNP  ammonium lactate (AMLACTIN DAILY) 12 % lotion Apply 1 Application topically as needed. 08/31/23   Candelaria Stagers, DPM  divalproex (DEPAKOTE) 250 MG DR tablet Take 250mg  in AM and 500mg  in PM 09/06/23   Tillman Abide I, MD  fluticasone-salmeterol [facility] INHUB) 100-50 MCG/ACT AEPB Inhale 1 puff into the lungs 2 (two) times daily. 08/20/23   Ghimire, Werner Lean, MD  guaiFENesin (MUCINEX) 600 MG 12 hr tablet Take 1 tablet (600 mg total) by mouth 2 (two) times daily. 08/20/23   Ghimire, Werner Lean, MD  magnesium gluconate (MAGONATE) 500 MG tablet Take 500 mg by mouth at bedtime.    [provider]  metoprolol succinate (TOPROL-XL) 50 MG 24 hr tablet TAKE 1 TABLET BY MOUTH EVERY DAY 09/14/23   Mort Sawyers, FNP  mirabegron ER (MYRBETRIQ) 50 MG TB24 tablet Take 1 tablet (50 mg total) by mouth daily. 10/11/23   Alfredo Martinez,  MD  traZODone (DESYREL) 50 MG tablet Take 1 tablet (50 mg total) by mouth at bedtime as needed for sleep. 08/20/23   Ghimire, Werner Lean, MD      Allergies    Olanzapine, Tomato (diagnostic), and Citrus    Review of Systems   Review of Systems  Physical Exam Updated Vital Signs BP 124/70 (BP Location: Right Arm)   Pulse 66   Temp 98.5 F (36.9 C) (Oral)   Resp 16   Ht 5\' 5"  (1.651 m)   Wt 88.5 kg   SpO2 100%   BMI 32.45 kg/m  Physical Exam  ED Results / Procedures / Treatments   Labs (all labs ordered are listed, but only abnormal results are displayed) Labs Reviewed  CBC - Abnormal; Notable for the following components:      Result Value   RDW 16.1 (*)    All other components within normal limits  COMPREHENSIVE METABOLIC PANEL WITH GFR - Abnormal; Notable for the following components:   Glucose, Bld 118 (*)    Creatinine, Ser 1.33 (*)    Alkaline Phosphatase 33 (*)    GFR, Estimated 43 (*)    All other components within normal limits  AMMONIA  URINALYSIS, ROUTINE W REFLEX MICROSCOPIC    EKG None  Radiology No results found.  Procedures Procedures  {Document cardiac monitor, telemetry assessment procedure when appropriate:1}  Medications Ordered in ED Medications - No data to display  ED Course/ Medical Decision Making/ A&P   {  Click here for ABCD2, HEART and other calculatorsREFRESH Note before signing :1}                              Medical Decision Making  ***  {Document critical care time when appropriate:1} {Document review of labs and clinical decision tools ie heart score, Chads2Vasc2 etc:1}  {Document your independent review of radiology images, and any outside records:1} {Document your discussion with family members, caretakers, and with consultants:1} {Document social determinants of health affecting pt's care:1} {Document your decision making why or why not admission, treatments were needed:1} Final Clinical Impression(s) / ED  Diagnoses Final diagnoses:  None    Rx / DC Orders ED Discharge Orders     None

## 2023-11-02 ENCOUNTER — Emergency Department (HOSPITAL_COMMUNITY)

## 2023-11-02 DIAGNOSIS — I6782 Cerebral ischemia: Secondary | ICD-10-CM | POA: Diagnosis not present

## 2023-11-02 DIAGNOSIS — K5900 Constipation, unspecified: Secondary | ICD-10-CM | POA: Diagnosis not present

## 2023-11-02 LAB — URINALYSIS, W/ REFLEX TO CULTURE (INFECTION SUSPECTED)
Bacteria, UA: NONE SEEN
Bilirubin Urine: NEGATIVE
Glucose, UA: NEGATIVE mg/dL
Hgb urine dipstick: NEGATIVE
Ketones, ur: NEGATIVE mg/dL
Leukocytes,Ua: NEGATIVE
Nitrite: NEGATIVE
Protein, ur: NEGATIVE mg/dL
Specific Gravity, Urine: 1.023 (ref 1.005–1.030)
pH: 6 (ref 5.0–8.0)

## 2023-11-02 NOTE — Discharge Instructions (Signed)
 Test results today are reassuring.  Take over-the-counter MiraLAX for help with constipation.  You can take 5-10 capfuls for a bowel cleanout dose.  After that, you can take a daily capful for maintenance of soft stools.  A fiber supplement such as Metamucil is helpful as well.  Return to the emergency department for any new or worsening symptoms of concern.

## 2023-11-03 ENCOUNTER — Telehealth: Payer: Self-pay

## 2023-11-03 NOTE — Telephone Encounter (Signed)
 Copied from CRM [id number]. Topic: Clinical - Request for Lab/Test Order >> Nov 03, 2023 12:07 PM Orien Bird wrote: Reason for CRM: [facility] reached out stating that they sent over a PT order on 3/12 and the order number is [id number] and they are wanting Nurse Dugal to sign and fax the order back to this direct fax number [phone]

## 2023-11-03 NOTE — Telephone Encounter (Signed)
 Dr Joelle Musca does not have any forms to sign. Will forward to Tabitha to see if she has these forms.

## 2023-11-03 NOTE — Transitions of Care (Post Inpatient/ED Visit) (Signed)
   11/03/2023  Name: Shelby Jordan MRN: [id number] DOB: 1951-06-19  Today's TOC FU Call Status: Today's TOC FU Call Status:: Successful TOC FU Call Completed TOC FU Call Complete Date: 11/03/23 Patient's Name and Date of Birth confirmed.  Transition Care Management Follow-up Telephone Call Date of Discharge: 11/02/23 Discharge Facility: [facility]) Type of Discharge: Emergency Department Reason for ED Visit: Other: (constipation) How have you been since you were released from the hospital?: Better Any questions or concerns?: No  Items Reviewed: Did you receive and understand the discharge instructions provided?: Yes Medications obtained,verified, and reconciled?: Yes (Medications Reviewed) Any new allergies since your discharge?: No Dietary orders reviewed?: Yes Do you have support at home?: Yes People in Home [RPT]: child(ren), adult  Medications Reviewed Today: Medications Reviewed Today     Reviewed by Karena Addison, LPN (Licensed Practical Nurse) on 11/03/23 at 1532  Med List Status: <None>   Medication Order Taking? Sig Documenting Provider Last Dose Status Informant  albuterol (VENTOLIN HFA) 108 (90 Base) MCG/ACT inhaler [id number] No Inhale 2 puffs into the lungs every 4 (four) hours as needed for shortness of breath. Maretta Bees, MD Taking Active   amLODipine (NORVASC) 5 MG tablet [id number] No TAKE 1 TABLET (5 MG TOTAL) BY MOUTH DAILY. Mort Sawyers, FNP Taking Active   ammonium lactate (AMLACTIN DAILY) 12 % lotion [id number] No Apply 1 Application topically as needed. Candelaria Stagers, DPM Taking Active   divalproex (DEPAKOTE) 250 MG DR tablet [id number] No Take 250mg  in AM and 500mg  in PM Tillman Abide I, MD Taking Active   fluticasone-salmeterol [facility] INHUB) 100-50 MCG/ACT AEPB [id number] No Inhale 1 puff into the lungs 2 (two) times daily. Ghimire, Werner Lean, MD Taking Active   guaiFENesin (MUCINEX) 600 MG 12 hr tablet [id number] No Take 1 tablet (600 mg  total) by mouth 2 (two) times daily. Maretta Bees, MD Taking Active   magnesium gluconate (MAGONATE) 500 MG tablet [id number] No Take 500 mg by mouth at bedtime. [provider] Taking Active Child, Family Member, Pharmacy Records  metoprolol succinate (TOPROL-XL) 50 MG 24 hr tablet [id number] No TAKE 1 TABLET BY MOUTH EVERY DAY Dugal, Tabitha, FNP Taking Active   mirabegron ER (MYRBETRIQ) 50 MG TB24 tablet [id number] No Take 1 tablet (50 mg total) by mouth daily. Alfredo Martinez, MD Taking Active   traZODone (DESYREL) 50 MG tablet [id number] No Take 1 tablet (50 mg total) by mouth at bedtime as needed for sleep. Maretta Bees, MD Taking Active             Home Care and Equipment/Supplies: Were Home Health Services Ordered?: NA Any new equipment or medical supplies ordered?: NA  Functional Questionnaire: Do you need assistance with bathing/showering or dressing?: No Do you need assistance with meal preparation?: No Do you need assistance with eating?: No Do you have difficulty maintaining continence: No Do you need assistance with getting out of bed/getting out of a chair/moving?: No Do you have difficulty managing or taking your medications?: No  Follow up appointments reviewed: PCP Follow-up appointment confirmed?: No (declined) MD Provider Line [phone] Given: No Specialist Hospital Follow-up appointment confirmed?: NA Do you need transportation to your follow-up appointment?: No Do you understand care options if your condition(s) worsen?: Yes-patient verbalized understanding    SIGNATURE Karena Addison, LPN [facility] Nurse Health Advisor Direct Dial [phone]

## 2023-11-03 NOTE — Telephone Encounter (Signed)
 No forms have been received.

## 2023-11-04 NOTE — Telephone Encounter (Signed)
 NO phone number was provided to be able to call them back. When they call back, please let them know they will need to fax the orders again.

## 2023-11-08 ENCOUNTER — Ambulatory Visit (INDEPENDENT_AMBULATORY_CARE_PROVIDER_SITE_OTHER): Payer: 59 | Admitting: Neurology

## 2023-11-08 ENCOUNTER — Encounter: Payer: Self-pay | Admitting: Neurology

## 2023-11-08 ENCOUNTER — Encounter: Payer: Self-pay | Admitting: Internal Medicine

## 2023-11-08 VITALS — BP 128/70 | HR 78 | Ht 64.0 in | Wt 191.0 lb

## 2023-11-08 DIAGNOSIS — F250 Schizoaffective disorder, bipolar type: Secondary | ICD-10-CM

## 2023-11-08 DIAGNOSIS — R569 Unspecified convulsions: Secondary | ICD-10-CM

## 2023-11-08 MED ORDER — DIVALPROEX SODIUM 250 MG PO DR TAB: DELAYED_RELEASE_TABLET | ORAL | 3 refills | Status: DC

## 2023-11-08 NOTE — Progress Notes (Signed)
 [facility]  PATIENT: Shelby Jordan DOB: 1950-10-28  REQUESTING CLINICIAN: Bhagat, Ygnacio Hemming, MD HISTORY FROM: Patient but mainly Daughter, and chart review  REASON FOR VISIT: Seizure    HISTORICAL  CHIEF COMPLAINT:  Chief Complaint  Patient presents with   New Patient (Initial Visit)    Pt in 12, here with daughter Mickiel [place]  Pt is referred by hospital for seizures. Pt's daughter states pt had a fall in January/2025, after that she had 4 seizures. States has not had anymore seizures since Jan/18.     HISTORY OF PRESENT ILLNESS:  This is 73 year old woman past medical history of schizoaffective disorder, bipolar disorder, seizure disorder, anxiety, depression who is presenting after a seizure on January 18.  History is mainly obtained from daughter.  Daughter tells me on January 18, patient fell while coming home after visiting a neighbor, she hit her head but they did not think of patient having a seizure at that time. The next day, they found patient on the floor with unexplained fall, she was taken to the hospital and in the ED waiting room she did have a seizure.  Patient was taken in the back, observed, was back to baseline and discharged home, while at home, again patient was found in the closet on the floor unexplained fall. Family thought that she might have another seizure and took her to [facility]. While in [place] ED she was reported to have a generalized seizure.  After further history, it was noted that patient was out of the Depakote  for about a month and a half.  Upon further history, daughter reports patient did have a seizure 3 years ago in the setting of running out of Depakote .  Daughter tells me that he believes patient first seizure was around 17 years ago.  She has been on Depakote  for bipolar disorder all this time and the family did not think that she had a seizure disorder.  Since being discharged from the hospital, she is on Depakote   500 mg twice daily, she is back to her normal self and no additional seizure, her Depakote  level was checked and was within normal limits.  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  She is following with a psychiatrist and she is not currently on any antipsychotic but they feel that she is the same, she is not getting worse.    OTHER MEDICAL CONDITIONS: Hypertension, Hyperlipidemia, Bipolar, Schizoaffective disorder   REVIEW OF SYSTEMS: Full 14 system review of systems performed and negative with exception of: As noted in the HPI   ALLERGIES: Allergies  Allergen Reactions   Olanzapine  Other (See Comments)    Parkinson-like symptoms   Tomato (Diagnostic) Other (See Comments)    Tongue break out.    Citrus Rash    HOME MEDICATIONS: Outpatient Medications Prior to Visit  Medication Sig Dispense Refill   albuterol  (VENTOLIN  HFA) 108 (90 Base) MCG/ACT inhaler Inhale 2 puffs into the lungs every 4 (four) hours as needed for shortness of breath. 18 g 1   amLODipine  (NORVASC ) 5 MG tablet TAKE 1 TABLET (5 MG TOTAL) BY MOUTH DAILY. 90 tablet 3   ammonium lactate  (AMLACTIN DAILY) 12 % lotion Apply 1 Application topically as needed. 400 g 0   fluticasone -salmeterol (WIXELA INHUB ) 100-50 MCG/ACT AEPB Inhale 1 puff into the lungs 2 (two) times daily. 60 each 1   guaiFENesin  (MUCINEX ) 600 MG  12 hr tablet Take 1 tablet (600 mg total) by mouth 2 (two) times daily. 14 tablet 0   magnesium  gluconate (MAGONATE) 500 MG tablet Take 500 mg by mouth at bedtime.     metoprolol  succinate (TOPROL -XL) 50 MG 24 hr tablet TAKE 1 TABLET BY MOUTH EVERY DAY 90 tablet 1   mirabegron  ER (MYRBETRIQ ) 50 MG TB24 tablet Take 1 tablet (50 mg total) by mouth daily. 30 tablet 11   traZODone  (DESYREL ) 50 MG tablet Take 1 tablet (50 mg total) by mouth at bedtime as needed for sleep. 30 tablet 0   divalproex  (DEPAKOTE ) 250 MG DR  tablet Take 250mg  in AM and 500mg  in PM 90 tablet 1   No facility-administered medications prior to visit.    PAST MEDICAL HISTORY: Past Medical History:  Diagnosis Date   Bipolar 1 disorder (HCC)    Depression    Hepatitis C    High cholesterol    Hypertension    Seizures (HCC)    UTI (urinary tract infection)     PAST SURGICAL HISTORY: Past Surgical History:  Procedure Laterality Date   APPENDECTOMY     BREAST BIOPSY Right 03/04/2022   stereo bx, mass "X" clip-path pending   TOE AMPUTATION      FAMILY HISTORY: Family History  Problem Relation Age of Onset   Mental illness Mother        born in mental hospital   Breast cancer Neg Hx     SOCIAL HISTORY: Social History   Socioeconomic History   Marital status: Legally Separated    Spouse name: Not on file   Number of children: Not on file   Years of education: Not on file   Highest education level: Not on file  Occupational History   Not on file  Tobacco Use   Smoking status: Former    Current packs/day: 0.00    Average packs/day: 1 pack/day for 30.0 years (30.0 ttl pk-yrs)    Types: Cigarettes    Start date: 31    Quit date: 2023    Years since quitting: 2.3    Passive exposure: Past   Smokeless tobacco: Never  Vaping Use   Vaping status: Never Used  Substance and Sexual Activity   Alcohol use: Never   Drug use: Never   Sexual activity: Not Currently    Partners: Male  Other Topics Concern   Not on file  Social History Narrative   Not on file   Social Drivers of Health   Financial Resource Strain: Low Risk  (08/26/2023)   Overall Financial Resource Strain (CARDIA)    Difficulty of Paying Living Expenses: Not hard at all  Food Insecurity: No Food Insecurity (08/26/2023)   Hunger Vital Sign    Worried About Running Out of Food in the Last Year: Never true    Ran Out of Food in the Last Year: Never true  Transportation Needs: No Transportation Needs (08/26/2023)   PRAPARE - Doctor, general practice (Medical): No    Lack of Transportation (Non-Medical): No  Physical Activity: Inactive (08/26/2023)   Exercise Vital Sign    Days of Exercise per Week: 0 days    Minutes of Exercise per Session: 0 min  Stress: No Stress Concern Present (08/26/2023)   [employer] of Occupational Health - Occupational Stress Questionnaire    Feeling of Stress : Not at all  Social Connections: Socially Isolated (08/26/2023)   Social Connection and Isolation Panel [NHANES]  Frequency of Communication with Friends and Family: More than three times a week    Frequency of Social Gatherings with Friends and Family: Once a week    Attends Religious Services: Never    Database administrator or Organizations: No    Attends Banker Meetings: Never    Marital Status: Widowed  Intimate Partner Violence: Not At Risk (08/24/2023)   Humiliation, Afraid, Rape, and Kick questionnaire    Fear of Current or Ex-Partner: No    Emotionally Abused: No    Physically Abused: No    Sexually Abused: No    PHYSICAL EXAM  GENERAL EXAM/CONSTITUTIONAL: Vitals:  Vitals:   11/08/23 1500  BP: 128/70  Pulse: 78  Weight: 191 lb (86.6 kg)  Height: 5\' 4"  (1.626 m)   Body mass index is 32.79 kg/m. Wt Readings from Last 3 Encounters:  11/08/23 191 lb (86.6 kg)  11/01/23 195 lb (88.5 kg)  10/14/23 195 lb (88.5 kg)   Patient is in no distress; well developed, nourished and groomed; neck is supple  MUSCULOSKELETAL: Gait, strength, tone, movements noted in Neurologic exam below  NEUROLOGIC: MENTAL STATUS:     03/02/2022    2:27 PM  MMSE - Mini Mental State Exam  Orientation to time 4   awake, alert, oriented to person, difficulty with orientation question, not sure about her own age.   CRANIAL NERVE:  2nd, 3rd, 4th, 6th - Visual fields full to confrontation, extraocular muscles intact, no nystagmus 5th - facial sensation symmetric 7th - facial strength symmetric 8th - hearing intact 9th  - palate elevates symmetrically, uvula midline 11th - shoulder shrug symmetric 12th - tongue protrusion midline  MOTOR:  normal bulk and tone, at least antigravity, able to stand.   SENSORY:  normal and symmetric to light touch  COORDINATION:  finger-nose-finger, fine finger movements normal  GAIT/STATION:  Deferred, using a wheelchair but able to stand.    DIAGNOSTIC DATA (LABS, IMAGING, TESTING) - I reviewed patient records, labs, notes, testing and imaging myself where available.  Lab Results  Component Value Date   WBC 5.1 11/01/2023   HGB 12.8 11/01/2023   HCT 39.3 11/01/2023   MCV 89.7 11/01/2023   PLT 258 11/01/2023      Component Value Date/Time   NA 139 11/01/2023 1725   K 4.8 11/01/2023 1725   CL 102 11/01/2023 1725   CO2 27 11/01/2023 1725   GLUCOSE 118 (H) 11/01/2023 1725   BUN 15 11/01/2023 1725   CREATININE 1.33 (H) 11/01/2023 1725   CALCIUM  9.7 11/01/2023 1725   PROT 7.1 11/01/2023 1725   ALBUMIN 3.8 11/01/2023 1725   AST 17 11/01/2023 1725   ALT 15 11/01/2023 1725   ALKPHOS 33 (L) 11/01/2023 1725   BILITOT 0.6 11/01/2023 1725   GFRNONAA 43 (L) 11/01/2023 1725   GFRAA 40 (L) 01/01/2020 1732   Lab Results  Component Value Date   CHOL 233 (H) 11/28/2021   HDL 71.50 11/28/2021   LDLCALC 135 (H) 11/28/2021   TRIG 133.0 11/28/2021   Lab Results  Component Value Date   HGBA1C 5.8 10/08/2023   Lab Results  Component Value Date   VITAMINB12 1,356 (H) 04/15/2023   Lab Results  Component Value Date   TSH 1.09 03/02/2022   Depakote  level 10/08/2023  90.1  MRI Brain 08/10/2023 No acute or reversible finding. 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 Moderate to pronounced chronic small-vessel ischemic changes  of the deep and subcortical white matter of both hemispheres.  Overnight EEG 08/09/2023 No acute or reversible finding. 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 Old infarction  in the left basal ganglia. Moderate to pronounced chronic small-vessel ischemic changes of the deep and subcortical white matter of both hemispheres.  I personally reviewed brain Images and previous EEG reports.   ASSESSMENT AND PLAN  73 y.o. year old female  with history of schizoaffective disorder, bipolar disorder, hypertension, hyperlipidemia who is presenting after a seizure in the setting of running out of Depakote  for a month.  Patient is back on Depakote  500 mg twice daily, has not had any additional seizures.  Plan will be for patient to continue with Depakote  500 mg twice daily. For her mental illness, I have recommended them to continue following up with psychiatry.  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  Her most recent Depakote  level and liver function test were was normal, we will not complete any lab work today.  Advised them to follow-up in 6 months or sooner if worse, at next follow-up will focus more on memory evaluation.   1. Schizoaffective disorder, bipolar type (HCC)   2. Seizures (HCC)     Patient Instructions  Continue with Depakote  500 mg twice daily Continue your other medications Continue to follow up with psychiatry  Follow-up in 6 months at that time we will do a more focused memory evaluation Return sooner if worse.   Per [place] statutes, patients with seizures are not allowed to drive until they have been seizure-free for six months.  Other recommendations include using caution when using heavy equipment or power tools. Avoid working on ladders or at heights. Take showers instead of baths.  Do not swim alone.  Ensure the water  temperature is not too high on the home water  heater. Do not go swimming alone. Do not lock yourself in a room alone (i.e. bathroom). When caring for infants or small children, sit down when  holding, feeding, or changing them to minimize risk of injury to the child in the event you have a seizure. Maintain good sleep hygiene. Avoid alcohol.  Also recommend adequate sleep, hydration, good diet and minimize stress.   During the Seizure  - First, ensure adequate ventilation and place patients on the floor on their left side  Loosen clothing around the neck and ensure the airway is patent. If the patient is clenching the teeth, do not force the mouth open with any object as this can cause severe damage - Remove all items from the surrounding that can be hazardous. The patient may be oblivious to what's happening and may not even know what he or she is doing. If the patient is confused and wandering, either gently guide him/her away and block access to outside areas - Reassure the individual and be comforting - Call 911. In most cases, the seizure ends before EMS arrives. However, there are cases when seizures may last over 3 to 5 minutes. Or the individual may have developed breathing difficulties or severe injuries. If a pregnant patient or a person with diabetes develops a seizure, it is prudent to call an ambulance. - Finally, if the patient does not regain full consciousness, then call EMS. Most patients will remain confused for about 45 to 90 minutes after a seizure, so you must use judgment in calling for  help. - Avoid restraints but make sure the patient is in a bed with padded side rails - Place the individual in a lateral position with the neck slightly flexed; this will help the saliva drain from the mouth and prevent the tongue from falling backward - Remove all nearby furniture and other hazards from the area - Provide verbal assurance as the individual is regaining consciousness - Provide the patient with privacy if possible - Call for help and start treatment as ordered by the caregiver   After the Seizure (Postictal Stage)  After a seizure, most patients experience  confusion, fatigue, muscle pain and/or a headache. Thus, one should permit the individual to sleep. For the next few days, reassurance is essential. Being calm and helping reorient the person is also of importance.  Most seizures are painless and end spontaneously. Seizures are not harmful to others but can lead to complications such as stress on the lungs, brain and the heart. Individuals with prior lung problems may develop labored breathing and respiratory distress.    Discussed Patients with epilepsy have a small risk of sudden unexpected death, a condition referred to as sudden unexpected death in epilepsy (SUDEP). SUDEP is defined specifically as the sudden, unexpected, witnessed or unwitnessed, nontraumatic and nondrowning death in patients with epilepsy with or without evidence for a seizure, and excluding documented status epilepticus, in which post mortem examination does not reveal a structural or toxicologic cause for death     No orders of the defined types were placed in this encounter.   Meds ordered this encounter  Medications   divalproex  (DEPAKOTE ) 250 MG DR tablet    Sig: Take 1 tablet (250 mg total) by mouth every morning AND 2 tablets (500 mg total) at bedtime. Take 250mg  in AM and 500mg  in PM.    Dispense:  270 tablet    Refill:  3    Return in about 6 months (around 05/09/2024).  I have spent a total of 65 minutes dedicated to this patient today, preparing to see patient, performing a medically appropriate examination and evaluation, ordering tests and/or medications and procedures, and counseling and educating the patient/family/caregiver; independently interpreting result and communicating results to the family/patient/caregiver; and documenting clinical information in the electronic medical record.   Cassandra Cleveland, MD 11/08/2023, 5:35 PM  [facility] [address] [place], [place] [address] [phone]

## 2023-11-08 NOTE — Patient Instructions (Addendum)
 Continue with Depakote  500 mg twice daily Continue your other medications Continue to follow up with psychiatry  Follow-up in 6 months at that time we will do a more focused memory evaluation Return sooner if worse.

## 2023-11-10 DIAGNOSIS — F17210 Nicotine dependence, cigarettes, uncomplicated: Secondary | ICD-10-CM | POA: Diagnosis not present

## 2023-11-18 ENCOUNTER — Telehealth: Payer: Self-pay | Admitting: Internal Medicine

## 2023-11-18 NOTE — Telephone Encounter (Signed)
 Copied from CRM [id number]. Topic: General - Other >> Nov 18, 2023  9:43 AM [name] wrote: Reason for CRM: Shelby Jordan with [facility] called regarding faxed orders that they have sent for the patient. These orders are for physical therapy and Shelby Jordan stated she had faxed them again this morning 5/1. Callback number [phone].

## 2023-11-18 NOTE — Telephone Encounter (Signed)
 Keeping and eye out have not seen it as of yet will reach out to verify fax number

## 2023-11-18 NOTE — Telephone Encounter (Signed)
 Called Shelby Jordan to have them refax

## 2023-11-24 NOTE — Telephone Encounter (Signed)
 Spoke to [place]. Advised her that Dr Joelle Musca took over her care in February. Marking out Tabitha's name and putting his is not an option. They will have to do all new forms with his name on it to sign and send a form to Tabitha to sign that states pt has changed to Dr Joelle Musca as PCP.

## 2023-11-24 NOTE — Telephone Encounter (Signed)
 Copied from CRM [id number]. Topic: Referral - Prior Authorization Question >> Nov 24, 2023  2:13 PM Aisha D wrote: Reason for CRM: Wallene Gum with [facility] is calling in regards to the documents that were faxed regarding the frequency for PT. Wallene Gum stated that she resent the forms on 5/1 and will resend them again today. Wallene Gum also stated that it will almost be 2 months out of medicare compliance and needs to the forms signed and faxed back today.

## 2023-11-24 NOTE — Telephone Encounter (Addendum)
 Order #[id number] has been signed and faxed back 10-26-23 and 11-18-23

## 2023-12-06 ENCOUNTER — Encounter

## 2023-12-10 ENCOUNTER — Telehealth: Payer: Self-pay

## 2023-12-10 NOTE — Telephone Encounter (Signed)
 Mirabegron  is not covered by insurance.  Alternatives include oxybutynin , solifenacin, tolterodine or trospium.  She has tried gemtesa 

## 2023-12-14 MED ORDER — OXYBUTYNIN CHLORIDE ER 10 MG PO TB24: 10.0000 mg | ORAL_TABLET | Freq: Every day | ORAL | 11 refills | Status: DC

## 2023-12-14 NOTE — Telephone Encounter (Signed)
Oxybutynin sent to pharmacy.

## 2023-12-20 ENCOUNTER — Ambulatory Visit: Admitting: Urology

## 2023-12-28 ENCOUNTER — Ambulatory Visit

## 2023-12-28 ENCOUNTER — Telehealth: Payer: Self-pay | Admitting: *Deleted

## 2023-12-28 NOTE — Telephone Encounter (Signed)
 Attempt to reach pt for pre-visit. LM with call back #.  Will attempt to reach again in 5 min due to no other # listed in profile  Second attempt to reach pt for pre-vist unsuccessful. LM with facility # for pt to call back. Instructed pt to call # given by end of the day and reschedule the pre-visit  with RN or the scheduled procedure will be canceled.

## 2023-12-28 NOTE — Progress Notes (Unsigned)
 Shelby Jordan

## 2023-12-29 ENCOUNTER — Encounter: Payer: Self-pay | Admitting: Internal Medicine

## 2023-12-31 ENCOUNTER — Encounter: Admitting: Internal Medicine

## 2024-01-03 ENCOUNTER — Encounter: Admitting: Internal Medicine

## 2024-01-04 ENCOUNTER — Encounter: Payer: Self-pay | Admitting: Urology

## 2024-01-18 ENCOUNTER — Encounter: Admitting: Internal Medicine

## 2024-01-19 ENCOUNTER — Ambulatory Visit

## 2024-01-19 ENCOUNTER — Telehealth: Payer: Self-pay | Admitting: *Deleted

## 2024-01-19 NOTE — Progress Notes (Unsigned)
 Shelby Jordan

## 2024-01-19 NOTE — Telephone Encounter (Signed)
 Call for PV person answered stating she was pt's daughter. Pt she states is  not available to verify self and give permission to speak with daughter. RN informed pt's daughter that due to HIPAA unless pt can come and verify identity and give permission to speak to her PV can not be done.  Pt's daughter stated she handles all her Mothers health issues. She said she will reschedule for another day.

## 2024-01-27 ENCOUNTER — Telehealth: Payer: Self-pay | Admitting: *Deleted

## 2024-01-27 NOTE — Patient Outreach (Signed)
 Acute call from patient's daughter stating that patient was showing manic symptoms, bought a plane ticket to [place]  for a funeral. Per patient's daughter patient is increasingly confused and disoriented and not able to travel independently. Per daughter, patient's sleep disturbed, easily agitated. Patient retrieved from the airport, daughter agreeable to contacting mobile crisis or presenting to the emergency room. Patient's daughter requesting assistance with placement in a memory care unit.  Encouraged patient's daughter to consider applying for  guardianship as well. CSW will follow up with patient's daughter to assist with placement options if needed.   Hayes Czaja, LCSW [place]  [facility], [facility] Licensed Clinical Social Worker  Direct Dial: [phone]   l

## 2024-01-29 ENCOUNTER — Inpatient Hospital Stay (HOSPITAL_COMMUNITY)
Admission: EM | Admit: 2024-01-29 | Discharge: 2024-01-31 | DRG: 101 | Disposition: A | Discharge status: Home Health | Attending: Internal Medicine | Admitting: Internal Medicine

## 2024-01-29 ENCOUNTER — Observation Stay (HOSPITAL_COMMUNITY)

## 2024-01-29 ENCOUNTER — Emergency Department (HOSPITAL_COMMUNITY)

## 2024-01-29 DIAGNOSIS — E66811 Obesity, class 1: Secondary | ICD-10-CM | POA: Diagnosis present

## 2024-01-29 DIAGNOSIS — N1832 Chronic kidney disease, stage 3b: Secondary | ICD-10-CM | POA: Diagnosis present

## 2024-01-29 DIAGNOSIS — R471 Dysarthria and anarthria: Secondary | ICD-10-CM | POA: Diagnosis present

## 2024-01-29 DIAGNOSIS — I700 Atherosclerosis of aorta: Secondary | ICD-10-CM | POA: Diagnosis not present

## 2024-01-29 DIAGNOSIS — G40909 Epilepsy, unspecified, not intractable, without status epilepticus: Secondary | ICD-10-CM

## 2024-01-29 DIAGNOSIS — Z888 Allergy status to other drugs, medicaments and biological substances status: Secondary | ICD-10-CM

## 2024-01-29 DIAGNOSIS — I10 Essential (primary) hypertension: Secondary | ICD-10-CM | POA: Diagnosis not present

## 2024-01-29 DIAGNOSIS — R0902 Hypoxemia: Secondary | ICD-10-CM | POA: Diagnosis not present

## 2024-01-29 DIAGNOSIS — Z818 Family history of other mental and behavioral disorders: Secondary | ICD-10-CM

## 2024-01-29 DIAGNOSIS — I129 Hypertensive chronic kidney disease with stage 1 through stage 4 chronic kidney disease, or unspecified chronic kidney disease: Secondary | ICD-10-CM | POA: Diagnosis not present

## 2024-01-29 DIAGNOSIS — G8384 Todd's paralysis (postepileptic): Secondary | ICD-10-CM | POA: Diagnosis present

## 2024-01-29 DIAGNOSIS — G40201 Localization-related (focal) (partial) symptomatic epilepsy and epileptic syndromes with complex partial seizures, not intractable, with status epilepticus: Principal | ICD-10-CM | POA: Diagnosis present

## 2024-01-29 DIAGNOSIS — R29818 Other symptoms and signs involving the nervous system: Secondary | ICD-10-CM | POA: Diagnosis not present

## 2024-01-29 DIAGNOSIS — I671 Cerebral aneurysm, nonruptured: Secondary | ICD-10-CM | POA: Diagnosis not present

## 2024-01-29 DIAGNOSIS — Z91018 Allergy to other foods: Secondary | ICD-10-CM

## 2024-01-29 DIAGNOSIS — R531 Weakness: Secondary | ICD-10-CM | POA: Diagnosis present

## 2024-01-29 DIAGNOSIS — E1122 Type 2 diabetes mellitus with diabetic chronic kidney disease: Secondary | ICD-10-CM | POA: Diagnosis not present

## 2024-01-29 DIAGNOSIS — Z8673 Personal history of transient ischemic attack (TIA), and cerebral infarction without residual deficits: Secondary | ICD-10-CM

## 2024-01-29 DIAGNOSIS — Z6834 Body mass index (BMI) 34.0-34.9, adult: Secondary | ICD-10-CM

## 2024-01-29 DIAGNOSIS — Z8619 Personal history of other infectious and parasitic diseases: Secondary | ICD-10-CM | POA: Diagnosis not present

## 2024-01-29 DIAGNOSIS — E7800 Pure hypercholesterolemia, unspecified: Secondary | ICD-10-CM | POA: Diagnosis present

## 2024-01-29 DIAGNOSIS — R569 Unspecified convulsions: Secondary | ICD-10-CM | POA: Diagnosis not present

## 2024-01-29 DIAGNOSIS — F17210 Nicotine dependence, cigarettes, uncomplicated: Secondary | ICD-10-CM | POA: Diagnosis present

## 2024-01-29 DIAGNOSIS — I6381 Other cerebral infarction due to occlusion or stenosis of small artery: Secondary | ICD-10-CM | POA: Diagnosis not present

## 2024-01-29 DIAGNOSIS — I160 Hypertensive urgency: Secondary | ICD-10-CM | POA: Diagnosis not present

## 2024-01-29 DIAGNOSIS — F319 Bipolar disorder, unspecified: Secondary | ICD-10-CM | POA: Diagnosis present

## 2024-01-29 DIAGNOSIS — Z79899 Other long term (current) drug therapy: Secondary | ICD-10-CM | POA: Diagnosis not present

## 2024-01-29 LAB — DIFFERENTIAL
Abs Immature Granulocytes: 0.03 K/uL (ref 0.00–0.07)
Basophils Absolute: 0 K/uL (ref 0.0–0.1)
Basophils Relative: 0 %
Eosinophils Absolute: 0.1 K/uL (ref 0.0–0.5)
Eosinophils Relative: 1 %
Immature Granulocytes: 0 %
Lymphocytes Relative: 19 %
Lymphs Abs: 1.7 K/uL (ref 0.7–4.0)
Monocytes Absolute: 0.7 K/uL (ref 0.1–1.0)
Monocytes Relative: 7 %
Neutro Abs: 6.6 K/uL (ref 1.7–7.7)
Neutrophils Relative %: 73 %

## 2024-01-29 LAB — COMPREHENSIVE METABOLIC PANEL WITH GFR
ALT: 19 U/L (ref 0–44)
AST: 40 U/L (ref 15–41)
Albumin: 4.1 g/dL (ref 3.5–5.0)
Alkaline Phosphatase: 42 U/L (ref 38–126)
Anion gap: 11 (ref 5–15)
BUN: 18 mg/dL (ref 8–23)
CO2: 21 mmol/L — ABNORMAL LOW (ref 22–32)
Calcium: 9.8 mg/dL (ref 8.9–10.3)
Chloride: 105 mmol/L (ref 98–111)
Creatinine, Ser: 1.26 mg/dL — ABNORMAL HIGH (ref 0.44–1.00)
GFR, Estimated: 45 mL/min — ABNORMAL LOW (ref >60)
Glucose, Bld: 135 mg/dL — ABNORMAL HIGH (ref 70–99)
Potassium: 5 mmol/L (ref 3.5–5.1)
Sodium: 137 mmol/L (ref 135–145)
Total Bilirubin: 0.8 mg/dL (ref 0.0–1.2)
Total Protein: 7.4 g/dL (ref 6.5–8.1)

## 2024-01-29 LAB — I-STAT CHEM 8, ED
BUN: 24 mg/dL — ABNORMAL HIGH (ref 8–23)
Calcium, Ion: 1.23 mmol/L (ref 1.15–1.40)
Chloride: 104 mmol/L (ref 98–111)
Creatinine, Ser: 1.2 mg/dL — ABNORMAL HIGH (ref 0.44–1.00)
Glucose, Bld: 138 mg/dL — ABNORMAL HIGH (ref 70–99)
HCT: 43 % (ref 36.0–46.0)
Hemoglobin: 14.6 g/dL (ref 12.0–15.0)
Potassium: 5 mmol/L (ref 3.5–5.1)
Sodium: 137 mmol/L (ref 135–145)
TCO2: 24 mmol/L (ref 22–32)

## 2024-01-29 LAB — CBC
HCT: 41.3 % (ref 36.0–46.0)
Hemoglobin: 13.5 g/dL (ref 12.0–15.0)
MCH: 30.2 pg (ref 26.0–34.0)
MCHC: 32.7 g/dL (ref 30.0–36.0)
MCV: 92.4 fL (ref 80.0–100.0)
Platelets: 189 K/uL (ref 150–400)
RBC: 4.47 MIL/uL (ref 3.87–5.11)
RDW: 14.8 % (ref 11.5–15.5)
WBC: 9 K/uL (ref 4.0–10.5)
nRBC: 0 % (ref 0.0–0.2)

## 2024-01-29 LAB — RAPID URINE DRUG SCREEN, HOSP PERFORMED
Amphetamines: NOT DETECTED
Barbiturates: NOT DETECTED
Benzodiazepines: NOT DETECTED
Cocaine: NOT DETECTED
Opiates: NOT DETECTED
Tetrahydrocannabinol: NOT DETECTED

## 2024-01-29 LAB — ETHANOL: Alcohol, Ethyl (B): <15 mg/dL (ref <15)

## 2024-01-29 LAB — CBG MONITORING, ED: Glucose-Capillary: 142 mg/dL — ABNORMAL HIGH (ref 70–99)

## 2024-01-29 LAB — VALPROIC ACID LEVEL: Valproic Acid Lvl: 69 ug/mL (ref 50–100)

## 2024-01-29 MED ORDER — POLYETHYLENE GLYCOL 3350 17 G PO PACK: 17.0000 g | PACK | Freq: Every day | ORAL | Status: DC | PRN

## 2024-01-29 MED ORDER — ENOXAPARIN SODIUM 40 MG/0.4ML IJ SOSY
40.0000 mg | PREFILLED_SYRINGE | INTRAMUSCULAR | Status: DC
  Administered 2024-01-30 – 2024-01-31 (×2): 40 mg via SUBCUTANEOUS
  Filled 2024-01-29 (×2): qty 0.4

## 2024-01-29 MED ORDER — MELATONIN 5 MG PO TABS
5.0000 mg | ORAL_TABLET | Freq: Every evening | ORAL | Status: DC | PRN
  Administered 2024-01-30: 5 mg via ORAL
  Filled 2024-01-29: qty 1

## 2024-01-29 MED ORDER — SODIUM CHLORIDE 0.9 % IV BOLUS
1000.0000 mL | Freq: Once | INTRAVENOUS | Status: AC
  Administered 2024-01-29: 1000 mL via INTRAVENOUS

## 2024-01-29 MED ORDER — IOHEXOL 350 MG/ML SOLN
75.0000 mL | Freq: Once | INTRAVENOUS | Status: AC | PRN
  Administered 2024-01-29: 75 mL via INTRAVENOUS

## 2024-01-29 MED ORDER — PROCHLORPERAZINE EDISYLATE 10 MG/2ML IJ SOLN: 5.0000 mg | Freq: Four times a day (QID) | INTRAMUSCULAR | Status: DC | PRN

## 2024-01-29 MED ORDER — VALPROATE SODIUM 100 MG/ML IV SOLN
1000.0000 mg | Freq: Once | INTRAVENOUS | Status: AC
  Administered 2024-01-29: 1000 mg via INTRAVENOUS
  Filled 2024-01-29: qty 10

## 2024-01-29 MED ORDER — LORAZEPAM 2 MG/ML IJ SOLN
2.0000 mg | Freq: Once | INTRAMUSCULAR | Status: DC
  Filled 2024-01-29: qty 1

## 2024-01-29 MED ORDER — ACETAMINOPHEN 325 MG PO TABS: 650.0000 mg | ORAL_TABLET | Freq: Four times a day (QID) | ORAL | Status: DC | PRN

## 2024-01-29 NOTE — ED Provider Notes (Signed)
 [place] [facility] AT [facility] Provider Note   CSN: [id number] Arrival date & time: 01/29/24  2006     Patient presents with: Code Stroke   Shelby Jordan is a 73 y.o. female hx of HTN, seizure here presenting with possible seizure.  Patient was at home and around 7 PM, he had a witnessed seizure.  Before then, she was wandering and very confused.  She had a witnessed tonic-clonic seizure activity by the daughter.  EMS was called and code stroke was activated.  Patient did not take her seizure meds this morning.  Per the daughter, patient typically takes her meds  {Add pertinent medical, surgical, social history, OB history to YEP:67052} The history is provided by the patient.       Prior to Admission medications   Medication Sig Start Date End Date Taking? Authorizing Provider  albuterol  (VENTOLIN  HFA) 108 (90 Base) MCG/ACT inhaler Inhale 2 puffs into the lungs every 4 (four) hours as needed for shortness of breath. 08/20/23   Ghimire, Donalda HERO, MD  amLODipine  (NORVASC ) 5 MG tablet TAKE 1 TABLET (5 MG TOTAL) BY MOUTH DAILY. 09/14/23 09/13/24  Dugal, Tabitha, FNP  ammonium lactate  (AMLACTIN DAILY) 12 % lotion Apply 1 Application topically as needed. 08/31/23   Tobie Franky SQUIBB, DPM  divalproex  (DEPAKOTE ) 250 MG DR tablet Take 1 tablet (250 mg total) by mouth every morning AND 2 tablets (500 mg total) at bedtime. Take 250mg  in AM and 500mg  in PM. 11/08/23 02/06/24  Gregg Lek, MD  fluticasone -salmeterol (WIXELA INHUB ) 100-50 MCG/ACT AEPB Inhale 1 puff into the lungs 2 (two) times daily. 08/20/23   Ghimire, Donalda HERO, MD  guaiFENesin  (MUCINEX ) 600 MG 12 hr tablet Take 1 tablet (600 mg total) by mouth 2 (two) times daily. 08/20/23   Ghimire, Donalda HERO, MD  magnesium  gluconate (MAGONATE) 500 MG tablet Take 500 mg by mouth at bedtime.    [provider]  metoprolol  succinate (TOPROL -XL) 50 MG 24 hr tablet TAKE 1 TABLET BY MOUTH EVERY DAY 09/14/23   Corwin Antu,  FNP  mirabegron  ER (MYRBETRIQ ) 50 MG TB24 tablet Take 1 tablet (50 mg total) by mouth daily. 10/11/23   Gaston Hamilton, MD  oxybutynin  (DITROPAN -XL) 10 MG 24 hr tablet Take 1 tablet (10 mg total) by mouth daily. 12/14/23   Gaston Hamilton, MD  traZODone  (DESYREL ) 50 MG tablet Take 1 tablet (50 mg total) by mouth at bedtime as needed for sleep. 08/20/23   Ghimire, Donalda HERO, MD    Allergies: Olanzapine , Tomato (diagnostic), and Citrus    Review of Systems  Neurological:  Positive for seizures.  All other systems reviewed and are negative.   Updated Vital Signs BP (!) 163/78   Pulse 84   Temp 98.4 F (36.9 C) (Oral)   Resp 17   SpO2 98%   Physical Exam Vitals and nursing note reviewed.  Constitutional:      Comments: Confused and altered  HENT:     Head: Normocephalic.     Right Ear: Tympanic membrane normal.     Left Ear: Tympanic membrane normal.     Nose: Nose normal.     Mouth/Throat:     Mouth: Mucous membranes are moist.  Eyes:     Extraocular Movements: Extraocular movements intact.     Pupils: Pupils are equal, round, and reactive to light.  Cardiovascular:     Rate and Rhythm: Normal rate and regular rhythm.     Pulses: Normal pulses.  Heart sounds: Normal heart sounds.  Pulmonary:     Effort: Pulmonary effort is normal.     Breath sounds: Normal breath sounds.  Abdominal:     General: Abdomen is flat.     Palpations: Abdomen is soft.  Musculoskeletal:        General: Normal range of motion.     Cervical back: Normal range of motion and neck supple.  Skin:    General: Skin is warm.     Capillary Refill: Capillary refill takes less than 2 seconds.  Neurological:     General: No focal deficit present.     Comments: Confused but no obvious tonic-clonic seizure activity.  No obvious eye deviation  Psychiatric:        Mood and Affect: Mood normal.        Behavior: Behavior normal.     (all labs ordered are listed, but only abnormal results are  displayed) Labs Reviewed  I-STAT CHEM 8, ED - Abnormal; Notable for the following components:      Result Value   BUN 24 (*)    Creatinine, Ser 1.20 (*)    Glucose, Bld 138 (*)    All other components within normal limits  CBG MONITORING, ED - Abnormal; Notable for the following components:   Glucose-Capillary 142 (*)    All other components within normal limits  PROTIME-INR  CBC  DIFFERENTIAL  ETHANOL  APTT  COMPREHENSIVE METABOLIC PANEL WITH GFR  RAPID URINE DRUG SCREEN, HOSP PERFORMED  VALPROIC  ACID LEVEL    EKG: None  Radiology: CT HEAD CODE STROKE WO CONTRAST Result Date: 01/29/2024 CLINICAL DATA:  Code stroke. Neuro deficit, acute, stroke suspected. EXAM: CT HEAD WITHOUT CONTRAST TECHNIQUE: Contiguous axial images were obtained from the base of the skull through the vertex without intravenous contrast. RADIATION DOSE REDUCTION: This exam was performed according to the departmental dose-optimization program which includes automated exposure control, adjustment of the mA and/or kV according to patient size and/or use of iterative reconstruction technique. COMPARISON:  CT head November 02, 2023. FINDINGS: Motion limited study.  Within this limitation: Brain: No evidence of acute large vascular territory infarct, acute hemorrhage, mass lesion, midline shift or hydrocephalus. Similar remote left cerebellar and left basal ganglia lacunar infarct. Similar patchy white matter hypodensities, compatible with chronic microvascular ischemic disease. Vascular: No hyperdense vessel seen. Skull: No acute fracture. Sinuses/Orbits: Clear sinuses.  No acute orbital findings. Other: No mastoid effusions. ASPECTS [facility] Stroke Program Early CT Score) Total score (0-10 with 10 being normal): 10. IMPRESSION: Motion limited study without evidence of acute intracranial abnormality. ASPECTS is 10. Code stroke imaging results were communicated on 01/29/2024 at 8:29 pm to provider Dr. Lindzen via secure text  paging. Electronically Signed   By: Gilmore GORMAN Molt M.D.   On: 01/29/2024 20:29    {Document cardiac monitor, telemetry assessment procedure when appropriate:32947} Procedures   Medications Ordered in the ED  valproate (DEPACON ) 1,000 mg in dextrose  5 % 50 mL IVPB (has no administration in time range)  iohexol  (OMNIPAQUE ) 350 MG/ML injection 75 mL (75 mLs Intravenous Contrast Given 01/29/24 2036)  sodium chloride  0.9 % bolus 1,000 mL (1,000 mLs Intravenous New Bag/Given 01/29/24 2111)      {Click here for ABCD2, HEART and other calculators REFRESH Note before signing:1}                              Medical Decision Making Othelia Crampton is a  73 y.o. female here presenting with confusion and seizure.  Patient has a history of seizure and had seizure-like activity and very confused afterwards.  Patient has waxing and waning left gaze preference.  Neurology recommend overnight EEG and Depakote  load after Depakote  level is sent  9:19 PM I reviewed patient's labs and they were unremarkable.  Depakote  level is pending.  CT did not show any bleeding.  Patient was given 10 mg/kg of Depakote  loading and will be observed overnight with overnight EEG   Amount and/or Complexity of Data Reviewed Labs: ordered. Radiology: ordered.     Final diagnoses:  None    ED Discharge Orders     None

## 2024-01-29 NOTE — Procedures (Signed)
 Patient Name: Shelby Jordan  MRN: [id number]  Epilepsy Attending: Arlin MALVA Krebs  Referring Physician/Provider: Patt Alm Macho, MD  Duration: 28.38 mins  Patient history: 73 y.o. female hx of HTN, seizure here presenting with possible seizure. EEG to evaluate for seizure  Level of alertness: Awake  AEDs during EEG study: VPA  Technical aspects: This EEG study was done with scalp electrodes positioned according to the 10-20 International system of electrode placement. Electrical activity was reviewed with band pass filter of 1-70Hz , sensitivity of 7 uV/mm, display speed of 73mm/sec with a 60Hz  notched filter applied as appropriate. EEG data were recorded continuously and digitally stored.  Video monitoring was available and reviewed as appropriate.  Description:  The posterior dominant rhythm consists of 9-10 Hz activity of moderate voltage (25-35 uV) seen predominantly in posterior head regions, symmetric and reactive to eye opening and eye closing. EEG showed continuous generalized 3 to 6 Hz theta-delta slowing.   Two seizures were noted arising from left frontal region.  During seizure, no clinical signs were seen. Onset of seizure was on 01/30/2024 at 0003 and at 0014, total duration about 1 minute 50 seconds and 3 minutes respectively. EEG showed  sharp waves in left frontal region. Then 9-10hz  alpha activity was noted in left parieto-occipital region which then evolved in to 3-5hz  theta-delta slowing and involved all of left hemisphere   Hyperventilation and photic stimulation were not performed.      ABNORMALITY - Seizure without clinical signs, left frontal region  - Continuous slow, generalized   IMPRESSION: This study showed two seizure without clinical signs arising from left frontal region on 01/30/2024 at 0003 and at 0014, lasting about 1 minute 50 seconds and 3 minutes respectively.   Additionally there is moderate diffuse encephalopathy.   Iverna Hammac O Burrell Hodapp

## 2024-01-29 NOTE — H&P (Signed)
 History and Physical  Etter Royall FMW:[id number] DOB: 12/26/1950 DOA: 01/29/2024  Referring physician: Dr. Patt, EDP  PCP: Jimmy Charlie FERNS, MD  Outpatient Specialists: Neurology Patient coming from: Home  Chief Complaint: Seizure-like activity, left-sided weakness.  HPI: Shelby Jordan is a 73 y.o. female with medical history significant for seizure disorder, prior stroke, CKD 3B, bipolar disorder, and recent manic episode 3 days ago, presents to the ER due to seizure-like activity and left-sided weakness.  EMS was activated and the patient was brought to the ER as a code stroke from home.  Unclear of last known well.  The patient received loading dose of Depakote  and IV Ativan .  Seen by neurology/stroke team.  Continuous EEG was initiated.  While in the ER the patient had another seizure-like activity.  Per her granddaughter at bedside, the patient is not back to her baseline.  Still confused, similar to prior manic episode.  ED Course: Temperature 98.4.  Blood pressure 109/64.  Pulse 74, respiratory rate 14, O2 saturation of 100% on room air.  Review of Systems: Review of systems as noted in the HPI. All other systems reviewed and are negative.   Past Medical History:  Diagnosis Date   Bipolar 1 disorder (HCC)    Depression    Hepatitis C    High cholesterol    Hypertension    Seizures (HCC)    UTI (urinary tract infection)    Past Surgical History:  Procedure Laterality Date   APPENDECTOMY     BREAST BIOPSY Right 03/04/2022   stereo bx, mass X clip-path pending   TOE AMPUTATION      Social History:  reports that she quit smoking about 2 years ago. Her smoking use included cigarettes. She started smoking about 32 years ago. She has a 30 pack-year smoking history. She has been exposed to tobacco smoke. She has never used smokeless tobacco. She reports that she does not drink alcohol and does not use drugs.   Allergies  Allergen Reactions   Olanzapine  Other (See  Comments)    Parkinson-like symptoms   Tomato (Diagnostic) Other (See Comments)    Tongue break out.    Citrus Rash    Family History  Problem Relation Age of Onset   Mental illness Mother        born in mental hospital   Breast cancer Neg Hx       Prior to Admission medications   Medication Sig Start Date End Date Taking? Authorizing Provider  albuterol  (VENTOLIN  HFA) 108 (90 Base) MCG/ACT inhaler Inhale 2 puffs into the lungs every 4 (four) hours as needed for shortness of breath. 08/20/23   Ghimire, Donalda HERO, MD  amLODipine  (NORVASC ) 5 MG tablet TAKE 1 TABLET (5 MG TOTAL) BY MOUTH DAILY. 09/14/23 09/13/24  Dugal, Tabitha, FNP  ammonium lactate  (AMLACTIN DAILY) 12 % lotion Apply 1 Application topically as needed. 08/31/23   Tobie Franky SQUIBB, DPM  divalproex  (DEPAKOTE ) 250 MG DR tablet Take 1 tablet (250 mg total) by mouth every morning AND 2 tablets (500 mg total) at bedtime. Take 250mg  in AM and 500mg  in PM. 11/08/23 02/06/24  Camara, Amadou, MD  fluticasone -salmeterol (WIXELA INHUB ) 100-50 MCG/ACT AEPB Inhale 1 puff into the lungs 2 (two) times daily. 08/20/23   Ghimire, Donalda HERO, MD  guaiFENesin  (MUCINEX ) 600 MG 12 hr tablet Take 1 tablet (600 mg total) by mouth 2 (two) times daily. 08/20/23   Ghimire, Donalda HERO, MD  magnesium  gluconate (MAGONATE) 500 MG tablet Take 500 mg  by mouth at bedtime.    [provider]  metoprolol  succinate (TOPROL -XL) 50 MG 24 hr tablet TAKE 1 TABLET BY MOUTH EVERY DAY 09/14/23   Corwin Antu, FNP  mirabegron  ER (MYRBETRIQ ) 50 MG TB24 tablet Take 1 tablet (50 mg total) by mouth daily. 10/11/23   Gaston Hamilton, MD  oxybutynin  (DITROPAN -XL) 10 MG 24 hr tablet Take 1 tablet (10 mg total) by mouth daily. 12/14/23   Gaston Hamilton, MD  traZODone  (DESYREL ) 50 MG tablet Take 1 tablet (50 mg total) by mouth at bedtime as needed for sleep. 08/20/23   Ghimire, Donalda HERO, MD    Physical Exam: BP (!) 163/78   Pulse 84   Temp 98.4 F (36.9 C) (Oral)   Resp  17   SpO2 98%   General: 73 y.o. year-old female well developed well nourished in no acute distress.  Somnolent. Cardiovascular: Regular rate and rhythm with no rubs or gallops.  No thyromegaly or JVD noted.  No lower extremity edema. 2/4 pulses in all 4 extremities. Respiratory: Clear to auscultation with no wheezes or rales. Good inspiratory effort. Abdomen: Soft nontender nondistended with normal bowel sounds x4 quadrants. Muskuloskeletal: No cyanosis, clubbing or edema noted bilaterally Neuro: CN II-XII intact, strength, sensation, reflexes Skin: No ulcerative lesions noted or rashes Psychiatry: Unable to assess judgment and mood due to somnolence.         Labs on Admission:  Basic Metabolic Panel: Recent Labs  Lab 01/29/24 2015  NA 137  137  K 5.0  5.0  CL 105  104  CO2 21*  GLUCOSE 135*  138*  BUN 18  24*  CREATININE 1.26*  1.20*  CALCIUM  9.8   Liver Function Tests: Recent Labs  Lab 01/29/24 2015  AST 40  ALT 19  ALKPHOS 42  BILITOT 0.8  PROT 7.4  ALBUMIN 4.1   No results for input(s): LIPASE, AMYLASE in the last 168 hours. No results for input(s): AMMONIA in the last 168 hours. CBC: Recent Labs  Lab 01/29/24 2015  WBC 9.0  NEUTROABS 6.6  HGB 14.6  13.5  HCT 43.0  41.3  MCV 92.4  PLT 189   Cardiac Enzymes: No results for input(s): CKTOTAL, CKMB, CKMBINDEX, TROPONINI in the last 168 hours.  BNP (last 3 results) No results for input(s): BNP in the last 8760 hours.  ProBNP (last 3 results) No results for input(s): PROBNP in the last 8760 hours.  CBG: Recent Labs  Lab 01/29/24 2010  GLUCAP 142*    Radiological Exams on Admission: CT ANGIO HEAD NECK W WO CM (CODE STROKE) Result Date: 01/29/2024 CLINICAL DATA:  Neuro deficit, acute, stroke suspected EXAM: CT ANGIOGRAPHY HEAD AND NECK WITH AND WITHOUT CONTRAST TECHNIQUE: Multidetector CT imaging of the head and neck was performed using the standard protocol during bolus  administration of intravenous contrast. Multiplanar CT image reconstructions and MIPs were obtained to evaluate the vascular anatomy. Carotid stenosis measurements (when applicable) are obtained utilizing NASCET criteria, using the distal internal carotid diameter as the denominator. RADIATION DOSE REDUCTION: This exam was performed according to the departmental dose-optimization program which includes automated exposure control, adjustment of the mA and/or kV according to patient size and/or use of iterative reconstruction technique. CONTRAST:  75mL OMNIPAQUE  IOHEXOL  350 MG/ML SOLN COMPARISON:  Same day CT head. FINDINGS: CTA NECK FINDINGS Aortic arch: Great vessel origins are patent without significant stenosis. Aortic atherosclerosis. Right carotid system: No evidence of dissection, stenosis (50% or greater), or occlusion. Left carotid system: No  evidence of dissection, stenosis (50% or greater), or occlusion. Vertebral arteries: Left dominant. No evidence of dissection, stenosis (50% or greater), or occlusion. Skeleton: No evidence of acute abnormality on limited assessment. Rotation of C1 on C2 is most likely positional in the absence of a fixed torticollis. Other neck: No evidence of acute abnormality on limited assessment. Upper chest: Emphysema.  Lung apices are clear. Review of the MIP images confirms the above findings CTA HEAD FINDINGS Anterior circulation: Bilateral intracranial ICAs, MCAs, and ACAs are patent without proximal hemodynamically significant stenosis. Approximately 4 mm outpouching/aneurysm at the origin of the left anterior communicating artery (series 8, image 111 and series 10, image 154). Posterior circulation: Bilateral intradural vertebral arteries, basilar artery and bilateral posterior arteries are patent without proximal hemodynamically significant stenosis. Prominent bilateral posterior communicating arteries and small P1 PCAs, anatomic variant. No aneurysm identified. Venous  sinuses: As permitted by contrast timing, patent. Anatomic variants: Described above. Review of the MIP images confirms the above findings IMPRESSION: 1. No emergent large vessel occlusion or proximal hemodynamically significant stenosis. 2. 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 3. Aortic Atherosclerosis (ICD10-I70.0) and Emphysema (ICD10-J43.9). Electronically Signed   By: Gilmore GORMAN Molt M.D.   On: 01/29/2024 21:20   CT HEAD CODE STROKE WO CONTRAST Result Date: 01/29/2024 CLINICAL DATA:  Code stroke. Neuro deficit, acute, stroke suspected. EXAM: CT HEAD WITHOUT CONTRAST TECHNIQUE: Contiguous axial images were obtained from the base of the skull through the vertex without intravenous contrast. RADIATION DOSE REDUCTION: This exam was performed according to the departmental dose-optimization program which includes automated exposure control, adjustment of the mA and/or kV according to patient size and/or use of iterative reconstruction technique. COMPARISON:  CT head November 02, 2023. FINDINGS: Motion limited study.  Within this limitation: Brain: No evidence of acute large vascular territory infarct, acute hemorrhage, mass lesion, midline shift or hydrocephalus. Similar remote left cerebellar and left basal ganglia lacunar infarct. Similar patchy white matter hypodensities, compatible with chronic microvascular ischemic disease. Vascular: No hyperdense vessel seen. Skull: No acute fracture. Sinuses/Orbits: Clear sinuses.  No acute orbital findings. Other: No mastoid effusions. ASPECTS [facility] Stroke Program Early CT Score) Total score (0-10 with 10 being normal): 10. IMPRESSION: Motion limited study without evidence of acute intracranial abnormality. ASPECTS is 10. Code stroke imaging results were communicated on 01/29/2024 at 8:29 pm to provider Dr. Lindzen via secure text paging. Electronically Signed   By: Gilmore GORMAN Molt M.D.   On: 01/29/2024 20:29    EKG: I independently viewed  the EKG done and my findings are as followed: Sinus rhythm rate of 75.  Nonspecific ST-T changes.  QTc 508  Assessment/Plan Present on Admission: **None**  Principal Problem:   Seizure-like activity (HCC)  Seizure-like activity Continue continuous EEG IV Ativan  for breakthrough seizures Keppra level was within therapeutic range. Continue Keppra Seizure precautions  Bipolar disorder Recent manic episode Resume home regimen  CKD 3B At baseline Avoid nephrotoxic agents, dehydration, and hypotension. Gentle IV fluid hydration NS at 50 cc/h x 1 day.  Obesity BMI 33 Recommend weight loss outpatient   Time: 75 minutes.     DVT prophylaxis: Subcu Lovenox  daily.  Code Status: Full code.  Family Communication: Granddaughter at bedside.  Disposition Plan: Admitted to telemetry medical unit.  Consults called: Neurology/stroke team.  Admission status: Observation status   Status is: Observation    Terry LOISE Hurst MD [facility] Pager [phone]  If 7PM-7AM, please contact night-coverage [email or web address] Password [email or web address]  01/29/2024, 10:17 PM

## 2024-01-29 NOTE — Consult Note (Signed)
 NEUROLOGY CONSULT NOTE   Date of service: January 29, 2024 Patient Name: Shelby Jordan MRN:  [id number] DOB:  [birth year] Chief Complaint: AMS Requesting Provider: Patt Alm Macho, MD  History of Present Illness  Shelby Jordan is a 73 y.o. female with a PMHx of bipolar 1 disorder, depression, hepatitis C, hypercholesterolemia, HTN and seizures (on Depakote ) who presents to the ED from home via EMS as a Code Stroke. LKN was 1900 when granddaughter saw the patient outside in the front yard acting normally. When granddaughter went back outside, the patient was wandering in the road in front of the house confused. She was brought inside to sit on couch and then had a seizure lasting approximately 3 minutes. On EMS arrival, the patient had left sided weakness, loss of sensation on the left and right sided gaze preference. EMS VS 192/118, HR 96, cbg 124, RR 20. On arrival to the ED, the patient was alert and oriented to self and time. Her last seizure per granddaughter was in January. The patient also had a stroke in January and has residual left sided weakness from this. The patient endorses that her seizures started about 5-10 years ago, information that required repeated questions with multiple choice format to obtain.   Her home AED regimen consists of Depakote  250 mg qAM and 500 mg at bedtime.   LKW: 1900 Modified rankin score: 1-No significant post stroke disability and can perform usual duties with stroke symptoms IV Thrombolysis/EVT:  No: Presentation most consistent with seizure   NIHSS components Score: Comment  1a Level of Conscious 0[]  1[x]  2[]  3[]     Waxing waning level of alertness  1b LOC Questions 0[]  1[x]  2[]      Month incorrect  1c LOC Commands 0[x]  1[]  2[]       2 Best Gaze 0[]  1[x]  2[]      Intermittent rightward gaze  3 Visual 0[x]  1[]  2[]  3[]      4 Facial Palsy 0[x]  1[]  2[]  3[]      5a Motor Arm - left 0[x]  1[]  2[]  3[]  4[]  UN[]    5b Motor Arm - Right 0[x]  1[]  2[]  3[]  4[]  UN[]     6a Motor Leg - Left 0[]  1[]  2[x]  3[]  4[]  UN[]    6b Motor Leg - Right 0[x]  1[]  2[]  3[]  4[]  UN[]    7 Limb Ataxia 0[x]  1[]  2[]  UN[]     Tremor noted  8 Sensory 0[]  1[x]  2[]  UN[]     Subjectively equal temp sensation x 4. Decreased FT sensation to left side  9 Best Language 0[x]  1[]  2[]  3[]      10 Dysarthria 0[]  1[x]  2[]  UN[]     Mild  11 Extinct. and Inattention 0[]  1[x]  2[]      Left visual hemifield extinction to DSS. Left sided sensory extinction to DSS with scratch stimuli  TOTAL:  8      ROS  As per HPI. Unable to obtain a comprehensive ROS due to postictal state.   Past History   Past Medical History:  Diagnosis Date   Bipolar 1 disorder (HCC)    Depression    Hepatitis C    High cholesterol    Hypertension    Seizures (HCC)    UTI (urinary tract infection)     Past Surgical History:  Procedure Laterality Date   APPENDECTOMY     BREAST BIOPSY Right 03/04/2022   stereo bx, mass X clip-path pending   TOE AMPUTATION      Family History: Family History  Problem Relation Age of Onset  Mental illness Mother        born in mental hospital   Breast cancer Neg Hx     Social History  reports that she quit smoking about 2 years ago. Her smoking use included cigarettes. She started smoking about 32 years ago. She has a 30 pack-year smoking history. She has been exposed to tobacco smoke. She has never used smokeless tobacco. She reports that she does not drink alcohol and does not use drugs.  Allergies  Allergen Reactions   Olanzapine  Other (See Comments)    Parkinson-like symptoms   Tomato (Diagnostic) Other (See Comments)    Tongue break out.    Citrus Rash    Medications  No current facility-administered medications for this encounter.  Current Outpatient Medications:    albuterol  (VENTOLIN  HFA) 108 (90 Base) MCG/ACT inhaler, Inhale 2 puffs into the lungs every 4 (four) hours as needed for shortness of breath., Disp: 18 g, Rfl: 1   amLODipine  (NORVASC ) 5 MG  tablet, TAKE 1 TABLET (5 MG TOTAL) BY MOUTH DAILY., Disp: 90 tablet, Rfl: 3   ammonium lactate  (AMLACTIN DAILY) 12 % lotion, Apply 1 Application topically as needed., Disp: 400 g, Rfl: 0   divalproex  (DEPAKOTE ) 250 MG DR tablet, Take 1 tablet (250 mg total) by mouth every morning AND 2 tablets (500 mg total) at bedtime. Take 250mg  in AM and 500mg  in PM., Disp: 270 tablet, Rfl: 3   fluticasone -salmeterol (WIXELA INHUB ) 100-50 MCG/ACT AEPB, Inhale 1 puff into the lungs 2 (two) times daily., Disp: 60 each, Rfl: 1   guaiFENesin  (MUCINEX ) 600 MG 12 hr tablet, Take 1 tablet (600 mg total) by mouth 2 (two) times daily., Disp: 14 tablet, Rfl: 0   magnesium  gluconate (MAGONATE) 500 MG tablet, Take 500 mg by mouth at bedtime., Disp: , Rfl:    metoprolol  succinate (TOPROL -XL) 50 MG 24 hr tablet, TAKE 1 TABLET BY MOUTH EVERY DAY, Disp: 90 tablet, Rfl: 1   mirabegron  ER (MYRBETRIQ ) 50 MG TB24 tablet, Take 1 tablet (50 mg total) by mouth daily., Disp: 30 tablet, Rfl: 11   oxybutynin  (DITROPAN -XL) 10 MG 24 hr tablet, Take 1 tablet (10 mg total) by mouth daily., Disp: 30 tablet, Rfl: 11   traZODone  (DESYREL ) 50 MG tablet, Take 1 tablet (50 mg total) by mouth at bedtime as needed for sleep., Disp: 30 tablet, Rfl: 0  Vitals    BP (!) 163/78   Pulse 84   Temp 98.4 F (36.9 C) (Oral)   Resp 17   SpO2 98%    Physical Exam   Constitutional: Appears well-developed and well-nourished.  Eyes: No scleral injection.  HENT: No OP obstruction.  Head: Normocephalic.  Respiratory: Effort normal, non-labored breathing.    Neurologic Examination   During exam at the bridge, the patient transiently had decreased volume of speech, decreased speech output, did not respond to questions and turned head to the left with leftward gaze. This lasted for about 10-15 seconds before resolving.   For other exam findings, see NIHSS  Labs/Imaging/Neurodiagnostic studies   See documentation below   ASSESSMENT  73 y.o.  female with a PMHx of bipolar 1 disorder, depression, hepatitis C, hypercholesterolemia, HTN and seizures (on Depakote ) who presents to the ED from home via EMS as a Code Stroke. LKN was 1900 when granddaughter saw the patient outside in the front yard acting normally. When granddaughter went back outside, the patient was wandering in the road in front of the house confused. She was brought inside  to sit on couch and then had a seizure lasting approximately 3 minutes. On EMS arrival, the patient had left sided weakness, loss of sensation on the left and right sided gaze preference. EMS VS 192/118, HR 96, cbg 124, RR 20. On arrival to the ED, the patient was alert and oriented to self and time. Her last seizure per granddaughter was in January. The patient also had a stroke in January and has residual left sided weakness from this. The patient endorses that her seizures started about 5-10 years ago, information that required repeated questions with multiple choice format to obtain.  - Exam reveals left sided weakness, left hemisensory deficit, poor orientation and equivocal intermittent complex partial seizure activity with transient gaze and head turning to the left, during which she has significantly decreased responsiveness to questions and commands.  - CT head: Motion limited study without evidence of acute intracranial abnormality. ASPECTS is 10. - CTA of head and neck: No emergent large vessel occlusion or proximal hemodynamically significant stenosis. 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 Aortic atherosclerosis. - Impression:  - Breakthrough seizure activity manifesting as intermittent complex partial seizures. The semiology is most consistent with a seizure focus in her right hemisphere. 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    RECOMMENDATIONS  - LTM EEG (ordered) - Depakote  level - Depakote  supplemental loading dose of 10 mg/kg IV  after level is drawn.  -  Continue her home Depakote  regimen at 250 mg qAM and 500 mg at bedtime. - Inpatient seizure precautions - Outpatient seizure precautions: Per [place]  DMV statutes, patients with seizures are not allowed to drive until  they have been seizure-free for six months. Use caution when using heavy equipment or power tools. Avoid working on ladders or at heights. Take showers instead of baths. Ensure the water  temperature is not too high on the home water  heater. Do not go swimming alone. When caring for infants or small children, sit down when holding, feeding, or changing them to minimize risk of injury to the child in the event you have a seizure. Also, Maintain good sleep hygiene. Avoid alcohol. - Ativan  2 mg IV PRN seizure recurrence and call Neurology  ______________________________________________________________________    Bonney SHARK, Branda Chaudhary, MD [facility]

## 2024-01-29 NOTE — ED Triage Notes (Signed)
 Pt BIB EMS as code stroke from home. Per pt granddaughter, pt was found wondering outside and confused, she was brought inside to sit on couch and had seizure episode lasting approx. 3 mins, states she is compliant with Depakote . EMS reports L side deficits, and intermittent ability to move L arm en route, R side gaze. Pt alert and oriented to self and time on arrival.   Pt grand daughter reports last seizure was January and stroke in January, L side weakness baseline.   EMS VS 192/118, HR 96, cbg 124, R 20

## 2024-01-30 ENCOUNTER — Encounter (HOSPITAL_COMMUNITY): Payer: Self-pay | Admitting: Internal Medicine

## 2024-01-30 ENCOUNTER — Observation Stay (HOSPITAL_COMMUNITY)

## 2024-01-30 ENCOUNTER — Other Ambulatory Visit: Payer: Self-pay

## 2024-01-30 DIAGNOSIS — R531 Weakness: Secondary | ICD-10-CM | POA: Diagnosis present

## 2024-01-30 DIAGNOSIS — E7800 Pure hypercholesterolemia, unspecified: Secondary | ICD-10-CM | POA: Diagnosis present

## 2024-01-30 DIAGNOSIS — I160 Hypertensive urgency: Secondary | ICD-10-CM | POA: Diagnosis present

## 2024-01-30 DIAGNOSIS — Z79899 Other long term (current) drug therapy: Secondary | ICD-10-CM | POA: Diagnosis not present

## 2024-01-30 DIAGNOSIS — Z8673 Personal history of transient ischemic attack (TIA), and cerebral infarction without residual deficits: Secondary | ICD-10-CM | POA: Diagnosis not present

## 2024-01-30 DIAGNOSIS — I671 Cerebral aneurysm, nonruptured: Secondary | ICD-10-CM | POA: Diagnosis present

## 2024-01-30 DIAGNOSIS — Z6834 Body mass index (BMI) 34.0-34.9, adult: Secondary | ICD-10-CM | POA: Diagnosis not present

## 2024-01-30 DIAGNOSIS — F319 Bipolar disorder, unspecified: Secondary | ICD-10-CM | POA: Diagnosis present

## 2024-01-30 DIAGNOSIS — Z888 Allergy status to other drugs, medicaments and biological substances status: Secondary | ICD-10-CM | POA: Diagnosis not present

## 2024-01-30 DIAGNOSIS — E66811 Obesity, class 1: Secondary | ICD-10-CM | POA: Diagnosis present

## 2024-01-30 DIAGNOSIS — Z818 Family history of other mental and behavioral disorders: Secondary | ICD-10-CM | POA: Diagnosis not present

## 2024-01-30 DIAGNOSIS — I129 Hypertensive chronic kidney disease with stage 1 through stage 4 chronic kidney disease, or unspecified chronic kidney disease: Secondary | ICD-10-CM | POA: Diagnosis present

## 2024-01-30 DIAGNOSIS — G40909 Epilepsy, unspecified, not intractable, without status epilepticus: Secondary | ICD-10-CM | POA: Diagnosis not present

## 2024-01-30 DIAGNOSIS — G8384 Todd's paralysis (postepileptic): Secondary | ICD-10-CM | POA: Diagnosis present

## 2024-01-30 DIAGNOSIS — Z8619 Personal history of other infectious and parasitic diseases: Secondary | ICD-10-CM | POA: Diagnosis not present

## 2024-01-30 DIAGNOSIS — Z91018 Allergy to other foods: Secondary | ICD-10-CM | POA: Diagnosis not present

## 2024-01-30 DIAGNOSIS — R471 Dysarthria and anarthria: Secondary | ICD-10-CM | POA: Diagnosis present

## 2024-01-30 DIAGNOSIS — F17210 Nicotine dependence, cigarettes, uncomplicated: Secondary | ICD-10-CM | POA: Diagnosis present

## 2024-01-30 DIAGNOSIS — R569 Unspecified convulsions: Secondary | ICD-10-CM | POA: Diagnosis present

## 2024-01-30 DIAGNOSIS — N1832 Chronic kidney disease, stage 3b: Secondary | ICD-10-CM | POA: Diagnosis present

## 2024-01-30 DIAGNOSIS — E1122 Type 2 diabetes mellitus with diabetic chronic kidney disease: Secondary | ICD-10-CM | POA: Diagnosis present

## 2024-01-30 DIAGNOSIS — G40201 Localization-related (focal) (partial) symptomatic epilepsy and epileptic syndromes with complex partial seizures, not intractable, with status epilepticus: Secondary | ICD-10-CM | POA: Diagnosis present

## 2024-01-30 DIAGNOSIS — I700 Atherosclerosis of aorta: Secondary | ICD-10-CM | POA: Diagnosis present

## 2024-01-30 LAB — URINALYSIS, ROUTINE W REFLEX MICROSCOPIC
Bilirubin Urine: NEGATIVE
Glucose, UA: NEGATIVE mg/dL
Hgb urine dipstick: NEGATIVE
Ketones, ur: NEGATIVE mg/dL
Leukocytes,Ua: NEGATIVE
Nitrite: NEGATIVE
Protein, ur: NEGATIVE mg/dL
Specific Gravity, Urine: 1.017 (ref 1.005–1.030)
pH: 6 (ref 5.0–8.0)

## 2024-01-30 LAB — BASIC METABOLIC PANEL WITH GFR
Anion gap: 9 (ref 5–15)
BUN: 13 mg/dL (ref 8–23)
CO2: 25 mmol/L (ref 22–32)
Calcium: 9.7 mg/dL (ref 8.9–10.3)
Chloride: 104 mmol/L (ref 98–111)
Creatinine, Ser: 1.05 mg/dL — ABNORMAL HIGH (ref 0.44–1.00)
GFR, Estimated: 56 mL/min — ABNORMAL LOW (ref >60)
Glucose, Bld: 106 mg/dL — ABNORMAL HIGH (ref 70–99)
Potassium: 4.3 mmol/L (ref 3.5–5.1)
Sodium: 138 mmol/L (ref 135–145)

## 2024-01-30 LAB — APTT: aPTT: 25 s (ref 24–36)

## 2024-01-30 LAB — CBC
HCT: 41.4 % (ref 36.0–46.0)
Hemoglobin: 13.6 g/dL (ref 12.0–15.0)
MCH: 30.4 pg (ref 26.0–34.0)
MCHC: 32.9 g/dL (ref 30.0–36.0)
MCV: 92.6 fL (ref 80.0–100.0)
Platelets: 236 K/uL (ref 150–400)
RBC: 4.47 MIL/uL (ref 3.87–5.11)
RDW: 14.8 % (ref 11.5–15.5)
WBC: 9 K/uL (ref 4.0–10.5)
nRBC: 0 % (ref 0.0–0.2)

## 2024-01-30 LAB — PROTIME-INR
INR: 1 (ref 0.8–1.2)
INR: 1 (ref 0.8–1.2)
Prothrombin Time: 13.7 s (ref 11.4–15.2)
Prothrombin Time: 13.6 s (ref 11.4–15.2)

## 2024-01-30 LAB — PHOSPHORUS: Phosphorus: 3.6 mg/dL (ref 2.5–4.6)

## 2024-01-30 LAB — MAGNESIUM: Magnesium: 1.8 mg/dL (ref 1.7–2.4)

## 2024-01-30 MED ORDER — SODIUM CHLORIDE 0.9 % IV BOLUS: 250.0000 mL | Freq: Once | INTRAVENOUS | Status: DC

## 2024-01-30 MED ORDER — LORAZEPAM 2 MG/ML IJ SOLN: 2.0000 mg | Freq: Once | INTRAMUSCULAR | Status: AC

## 2024-01-30 MED ORDER — LORAZEPAM 2 MG/ML IJ SOLN
INTRAMUSCULAR | Status: AC
  Administered 2024-01-30: 2 mg via INTRAVENOUS
  Filled 2024-01-30: qty 1

## 2024-01-30 MED ORDER — VALPROATE SODIUM 100 MG/ML IV SOLN
500.0000 mg | Freq: Once | INTRAVENOUS | Status: DC
  Filled 2024-01-30: qty 5

## 2024-01-30 MED ORDER — VALPROATE SODIUM 100 MG/ML IV SOLN
250.0000 mg | Freq: Once | INTRAVENOUS | Status: AC
  Administered 2024-01-30: 250 mg via INTRAVENOUS
  Filled 2024-01-30: qty 2.5

## 2024-01-30 MED ORDER — VALPROATE SODIUM 100 MG/ML IV SOLN
250.0000 mg | Freq: Four times a day (QID) | INTRAVENOUS | Status: DC
  Administered 2024-01-30 – 2024-01-31 (×3): 250 mg via INTRAVENOUS
  Filled 2024-01-30: qty 2.5
  Filled 2024-01-30: qty 250
  Filled 2024-01-30 (×4): qty 2.5
  Filled 2024-01-30: qty 250
  Filled 2024-01-30: qty 2.5

## 2024-01-30 MED ORDER — SODIUM CHLORIDE 0.9 % IV SOLN: INTRAVENOUS | Status: AC

## 2024-01-30 NOTE — Progress Notes (Signed)
 PROGRESS NOTE    Shelby Jordan  FMW:[id number] DOB: 1950/12/10 DOA: 01/29/2024 PCP: Jimmy Charlie FERNS, MD  Outpatient Specialists:     Brief Narrative:  Patient is a 73 year old African-American female with past medical history significant for seizure disorder, prior CVA, CKD stage IIIb, bipolar disorder with recent manic episode.  Patient was admitted with hypertensive urgency, encephalopathy and concerns for seizure-like activity.  Neurology input is appreciated.  Prior to presentation, patient was on Depakote  250 Mg p.o. in the morning and 500 Mg p.o. at nighttime.  01/30/2024: Patient seen.  Patient is a very poor historian.  No seizure episodes reported.  Patient is currently on IV valproate 250 Mg every 6 hourly.   Assessment & Plan:   Principal Problem:   Seizure-like activity (HCC)   Seizure-like activity -Continue continuous EEG -Continue IV valproate. - Neurology is assisting with patient's care. - Seizure precautions.     Bipolar disorder: -Recent manic episode -Continue to monitor closely. - Have a low threshold to consult the psychiatry team.     CKD 3a: At baseline Avoid nephrotoxic agents, dehydration, and hypotension. Gentle IV fluid hydration NS at 50 cc/h x 1 day.   Obesity BMI 33 Recommend weight loss outpatient     DVT prophylaxis: Subcutaneous Lovenox  Code Status: Full code. Family Communication:  Disposition Plan: Inpatient.   Consultants:  Neurology.  Procedures:  EEG.  Antimicrobials:  None.   Subjective: - Patient seen. - Patient is a poor historian.  Objective: Vitals:   01/30/24 0300 01/30/24 0500 01/30/24 0604 01/30/24 0930  BP: 109/64 126/64 (!) 106/47 95/61  Pulse: 73 73 71 71  Resp: 14 18 18 15   Temp:   97.9 F (36.6 C) 98 F (36.7 C)  TempSrc:   Oral Oral  SpO2: 100% 100% 99% 98%  Weight:      Height:       No intake or output data in the 24 hours ending 01/30/24 1209 Filed Weights   01/30/24 0001   Weight: 89.8 kg    Examination:  General exam: Appears calm and comfortable  Respiratory system: Clear to auscultation.  Cardiovascular system: S1 & S2  Gastrointestinal system: Abdomen is obese, soft and nontender.   Central nervous system: Alert and oriented.  Patient moves all extremities. Extremities: No leg edema.  Data Reviewed: I have personally reviewed following labs and imaging studies  CBC: Recent Labs  Lab 01/29/24 2015 01/30/24 0408  WBC 9.0 9.0  NEUTROABS 6.6  --   HGB 14.6  13.5 13.6  HCT 43.0  41.3 41.4  MCV 92.4 92.6  PLT 189 236   Basic Metabolic Panel: Recent Labs  Lab 01/29/24 2015 01/30/24 0408  NA 137  137 138  K 5.0  5.0 4.3  CL 105  104 104  CO2 21* 25  GLUCOSE 135*  138* 106*  BUN 18  24* 13  CREATININE 1.26*  1.20* 1.05*  CALCIUM  9.8 9.7  MG  --  1.8  PHOS  --  3.6   GFR: Estimated Creatinine Clearance: 52.5 mL/min (A) (by C-G formula based on SCr of 1.05 mg/dL (H)). Liver Function Tests: Recent Labs  Lab 01/29/24 2015  AST 40  ALT 19  ALKPHOS 42  BILITOT 0.8  PROT 7.4  ALBUMIN 4.1   No results for input(s): LIPASE, AMYLASE in the last 168 hours. No results for input(s): AMMONIA in the last 168 hours. Coagulation Profile: Recent Labs  Lab 01/29/24 2015 01/30/24 0110  INR 1.0 1.0  Cardiac Enzymes: No results for input(s): CKTOTAL, CKMB, CKMBINDEX, TROPONINI in the last 168 hours. BNP (last 3 results) No results for input(s): PROBNP in the last 8760 hours. HbA1C: No results for input(s): HGBA1C in the last 72 hours. CBG: Recent Labs  Lab 01/29/24 2010  GLUCAP 142*   Lipid Profile: No results for input(s): CHOL, HDL, LDLCALC, TRIG, CHOLHDL, LDLDIRECT in the last 72 hours. Thyroid  Function Tests: No results for input(s): TSH, T4TOTAL, FREET4, T3FREE, THYROIDAB in the last 72 hours. Anemia Panel: No results for input(s): VITAMINB12, FOLATE, FERRITIN, TIBC,  IRON, RETICCTPCT in the last 72 hours. Urine analysis:    Component Value Date/Time   COLORURINE YELLOW 11/02/2023 0009   APPEARANCEUR CLEAR 11/02/2023 0009   APPEARANCEUR Clear 06/07/2023 1335   LABSPEC 1.023 11/02/2023 0009   PHURINE 6.0 11/02/2023 0009   GLUCOSEU NEGATIVE 11/02/2023 0009   GLUCOSEU NEGATIVE 07/16/2022 1459   HGBUR NEGATIVE 11/02/2023 0009   BILIRUBINUR NEGATIVE 11/02/2023 0009   BILIRUBINUR Negative 06/07/2023 1335   KETONESUR NEGATIVE 11/02/2023 0009   PROTEINUR NEGATIVE 11/02/2023 0009   UROBILINOGEN 0.2 02/11/2023 1440   UROBILINOGEN 0.2 07/16/2022 1459   NITRITE NEGATIVE 11/02/2023 0009   LEUKOCYTESUR NEGATIVE 11/02/2023 0009   Sepsis Labs: @LABRCNTIP (procalcitonin:4,lacticidven:4)  )No results found for this or any previous visit (from the past 240 hours).       Radiology Studies: Overnight EEG with video Result Date: 01/30/2024 [name], MD     01/30/2024  8:13 AM Patient Name: Shelby Jordan MRN: [id number] Epilepsy Attending: Arlin KIDD Shelton Referring Physician/Provider: Merrianne Locus, MD Duration:  01/29/2024 0034 to 0730  Patient history: 73 y.o. female hx of HTN, seizure here presenting with possible seizure. EEG to evaluate for seizure  Level of alertness: Awake, asleep  AEDs during EEG study: VPA  Technical aspects: This EEG study was done with scalp electrodes positioned according to the 10-20 International system of electrode placement. Electrical activity was reviewed with band pass filter of 1-70Hz , sensitivity of 7 uV/mm, display speed of 66mm/sec with a 60Hz  notched filter applied as appropriate. EEG data were recorded continuously and digitally stored.  Video monitoring was available and reviewed as appropriate.  Description: The posterior dominant rhythm consists of 9-10 Hz activity of moderate voltage (25-35 uV) seen predominantly in posterior head regions, symmetric and reactive to eye opening and eye closing. Sleep was  characterized by sleep spindles (12-14Hz ), maximal fronto-central region. EEG showed continuous generalized 3 to 6 Hz theta-delta slowing.  At the onset of eeg, seizure was ongoing. Seizures were also noted on 01/30/2024 at 0041, 0045, 0047, lasting about 1-2 minutes each. EEG showed  sharp waves in left frontal region. Then 9-10hz  alpha activity was noted in left parieto-occipital region which then evolved in to 3-5hz  theta-delta slowing and involved all of left hemisphere. At around 0058, EEG worsened and patient went to status epilepticus. Initially, no clinical signs were noted. At around 0129, patient was noted to have right gaze deviation followed by generalized stiffening. EEG initially showed sharp waves in left frontal region. Then 9-10hz  alpha activity was noted in left parieto-occipital region which then evolved in to 3-5hz  theta-delta slowing and involved all of left hemisphere.EEG then involved right frontal region. Seizure ended at 0131.  Hyperventilation and photic stimulation were not performed.    ABNORMALITY - Status epilepticus, left frontal region - Seizure without clinical signs, left frontal region - Continuous slow, generalized IMPRESSION: At the beginning of the study, seizure was ongoing. EEG then showed three  more seizure without clinical signs arising from left frontal region on 01/30/2024 at 0041, 0045, 0047, lasting about 1-2 minutes each. At around 0058, EEG worsened and patient went to status epilepticus arising from left frontal region. Initially, no clinical signs were noted. At around 0129, patient was noted to have right gaze deviation followed by generalized stiffening. Seizure ended at 0131.  Additionally there is moderate diffuse encephalopathy.  Arlin MALVA Krebs   EEG adult Result Date: 01/29/2024 Krebs Arlin MALVA, MD     01/30/2024  7:57 AM Patient Name: Shelby Jordan MRN: [id number] Epilepsy Attending: Arlin MALVA Krebs Referring Physician/Provider: Patt Alm Macho, MD  Duration: 28.38 mins Patient history: 73 y.o. female hx of HTN, seizure here presenting with possible seizure. EEG to evaluate for seizure Level of alertness: Awake AEDs during EEG study: VPA Technical aspects: This EEG study was done with scalp electrodes positioned according to the 10-20 International system of electrode placement. Electrical activity was reviewed with band pass filter of 1-70Hz , sensitivity of 7 uV/mm, display speed of 74mm/sec with a 60Hz  notched filter applied as appropriate. EEG data were recorded continuously and digitally stored.  Video monitoring was available and reviewed as appropriate. Description:  The posterior dominant rhythm consists of 9-10 Hz activity of moderate voltage (25-35 uV) seen predominantly in posterior head regions, symmetric and reactive to eye opening and eye closing. EEG showed continuous generalized 3 to 6 Hz theta-delta slowing.  Two seizures were noted arising from left frontal region.  During seizure, no clinical signs were seen. Onset of seizure was on 01/30/2024 at 0003 and at 0014, total duration about 1 minute 50 seconds and 3 minutes respectively. EEG showed  sharp waves in left frontal region. Then 9-10hz  alpha activity was noted in left parieto-occipital region which then evolved in to 3-5hz  theta-delta slowing and involved all of left hemisphere  Hyperventilation and photic stimulation were not performed.    ABNORMALITY - Seizure without clinical signs, left frontal region - Continuous slow, generalized  IMPRESSION: This study showed two seizure without clinical signs arising from left frontal region on 01/30/2024 at 0003 and at 0014, lasting about 1 minute 50 seconds and 3 minutes respectively.  Additionally there is moderate diffuse encephalopathy.  Priyanka MALVA Krebs   CT ANGIO HEAD NECK W WO CM (CODE STROKE) Result Date: 01/29/2024 CLINICAL DATA:  Neuro deficit, acute, stroke suspected EXAM: CT ANGIOGRAPHY HEAD AND NECK WITH AND WITHOUT CONTRAST  TECHNIQUE: Multidetector CT imaging of the head and neck was performed using the standard protocol during bolus administration of intravenous contrast. Multiplanar CT image reconstructions and MIPs were obtained to evaluate the vascular anatomy. Carotid stenosis measurements (when applicable) are obtained utilizing NASCET criteria, using the distal internal carotid diameter as the denominator. RADIATION DOSE REDUCTION: This exam was performed according to the departmental dose-optimization program which includes automated exposure control, adjustment of the mA and/or kV according to patient size and/or use of iterative reconstruction technique. CONTRAST:  75mL OMNIPAQUE  IOHEXOL  350 MG/ML SOLN COMPARISON:  Same day CT head. FINDINGS: CTA NECK FINDINGS Aortic arch: Great vessel origins are patent without significant stenosis. Aortic atherosclerosis. Right carotid system: No evidence of dissection, stenosis (50% or greater), or occlusion. Left carotid system: No evidence of dissection, stenosis (50% or greater), or occlusion. Vertebral arteries: Left dominant. No evidence of dissection, stenosis (50% or greater), or occlusion. Skeleton: No evidence of acute abnormality on limited assessment. Rotation of C1 on C2 is most likely positional in the absence of a fixed torticollis.  Other neck: No evidence of acute abnormality on limited assessment. Upper chest: Emphysema.  Lung apices are clear. Review of the MIP images confirms the above findings CTA HEAD FINDINGS Anterior circulation: Bilateral intracranial ICAs, MCAs, and ACAs are patent without proximal hemodynamically significant stenosis. Approximately 4 mm outpouching/aneurysm at the origin of the left anterior communicating artery (series 8, image 111 and series 10, image 154). Posterior circulation: Bilateral intradural vertebral arteries, basilar artery and bilateral posterior arteries are patent without proximal hemodynamically significant stenosis. Prominent  bilateral posterior communicating arteries and small P1 PCAs, anatomic variant. No aneurysm identified. Venous sinuses: As permitted by contrast timing, patent. Anatomic variants: Described above. Review of the MIP images confirms the above findings IMPRESSION: 1. No emergent large vessel occlusion or proximal hemodynamically significant stenosis. 2. 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 3. Aortic Atherosclerosis (ICD10-I70.0) and Emphysema (ICD10-J43.9). Electronically Signed   By: Gilmore GORMAN Molt M.D.   On: 01/29/2024 21:20   CT HEAD CODE STROKE WO CONTRAST Result Date: 01/29/2024 CLINICAL DATA:  Code stroke. Neuro deficit, acute, stroke suspected. EXAM: CT HEAD WITHOUT CONTRAST TECHNIQUE: Contiguous axial images were obtained from the base of the skull through the vertex without intravenous contrast. RADIATION DOSE REDUCTION: This exam was performed according to the departmental dose-optimization program which includes automated exposure control, adjustment of the mA and/or kV according to patient size and/or use of iterative reconstruction technique. COMPARISON:  CT head November 02, 2023. FINDINGS: Motion limited study.  Within this limitation: Brain: No evidence of acute large vascular territory infarct, acute hemorrhage, mass lesion, midline shift or hydrocephalus. Similar remote left cerebellar and left basal ganglia lacunar infarct. Similar patchy white matter hypodensities, compatible with chronic microvascular ischemic disease. Vascular: No hyperdense vessel seen. Skull: No acute fracture. Sinuses/Orbits: Clear sinuses.  No acute orbital findings. Other: No mastoid effusions. ASPECTS [facility] Stroke Program Early CT Score) Total score (0-10 with 10 being normal): 10. IMPRESSION: Motion limited study without evidence of acute intracranial abnormality. ASPECTS is 10. Code stroke imaging results were communicated on 01/29/2024 at 8:29 pm to provider Dr. Lindzen via secure text  paging. Electronically Signed   By: Gilmore GORMAN Molt M.D.   On: 01/29/2024 20:29        Scheduled Meds:  enoxaparin  (LOVENOX ) injection  40 mg Subcutaneous Q24H   LORazepam   2 mg Intravenous Once   Continuous Infusions:  sodium chloride  50 mL/hr at 01/30/24 0757   valproate sodium        LOS: 0 days    Time spent: 35 minutes.    Leatrice Chapel, MD  [facility] Pager #: [phone] 7PM-7AM contact night coverage as above

## 2024-01-30 NOTE — Procedures (Addendum)
 Patient Name: Shelby Jordan  MRN: [id number]  Epilepsy Attending: Arlin MALVA Krebs  Referring Physician/Provider: Merrianne Locus, MD  Duration:  01/29/2024 0034 to 01/30/2024 0034   Patient history: 73 y.o. female hx of HTN, seizure here presenting with possible seizure. EEG to evaluate for seizure   Level of alertness: Awake, asleep   AEDs during EEG study: VPA   Technical aspects: This EEG study was done with scalp electrodes positioned according to the 10-20 International system of electrode placement. Electrical activity was reviewed with band pass filter of 1-70Hz , sensitivity of 7 uV/mm, display speed of 63mm/sec with a 60Hz  notched filter applied as appropriate. EEG data were recorded continuously and digitally stored.  Video monitoring was available and reviewed as appropriate.   Description: The posterior dominant rhythm consists of 9-10 Hz activity of moderate voltage (25-35 uV) seen predominantly in posterior head regions, symmetric and reactive to eye opening and eye closing. Sleep was characterized by sleep spindles (12-14Hz ), maximal fronto-central region. EEG showed continuous generalized 3 to 6 Hz theta-delta slowing.   At the onset of eeg, seizure was ongoing. Seizures were also noted on 01/30/2024 at 0041, 0045, 0047, lasting about 1-2 minutes each. EEG showed  sharp waves in left frontal region. Then 9-10hz  alpha activity was noted in left parieto-occipital region which then evolved in to 3-5hz  theta-delta slowing and involved all of left hemisphere.  At around 0058, EEG worsened and patient went to status epilepticus. Initially, no clinical signs were noted. At around 0129, patient was noted to have right gaze deviation followed by generalized stiffening. EEG initially showed sharp waves in left frontal region. Then 9-10hz  alpha activity was noted in left parieto-occipital region which then evolved in to 3-5hz  theta-delta slowing and involved all of left hemisphere.EEG then involved  right frontal region. Seizure ended at 0131.    Hyperventilation and photic stimulation were not performed.      ABNORMALITY - Status epilepticus, left frontal region - Seizure without clinical signs, left frontal region  - Continuous slow, generalized  IMPRESSION: At the beginning of the study, seizure was ongoing. EEG then showed three more seizure without clinical signs arising from left frontal region on 01/30/2024 at 0041, 0045, 0047, lasting about 1-2 minutes each.   At around 0058, EEG worsened and patient went to status epilepticus arising from left frontal region. Initially, no clinical signs were noted. At around 0129, patient was noted to have right gaze deviation followed by generalized stiffening. Seizure ended at 0131.    Additionally there is moderate diffuse encephalopathy.   Bentley Haralson O Yuliza Cara

## 2024-01-30 NOTE — Progress Notes (Signed)
 EEG complete - results pending

## 2024-01-30 NOTE — Progress Notes (Signed)
 LTM EEG hooked up and running - no initial skin breakdown - ER setting NOT monitored by [facility].

## 2024-01-30 NOTE — Progress Notes (Signed)
 NEUROLOGY CONSULT FOLLOW UP NOTE   Date of service: January 30, 2024 Patient Name: Shelby Jordan MRN:  [id number] DOB:  [birth year]  Interval Hx/subjective   - Daughter reports that patient is more responsive this afternoon, still more confused then baseline  Vitals   Vitals:   01/30/24 0500 01/30/24 0604 01/30/24 0930 01/30/24 1622  BP: 126/64 (!) 106/47 95/61 121/79  Pulse: 73 71 71 69  Resp: 18 18 15  (!) 23  Temp:  97.9 F (36.6 C) 98 F (36.7 C) 98.4 F (36.9 C)  TempSrc:  Oral Oral Oral  SpO2: 100% 99% 98% 100%  Weight:    90.1 kg  Height:         Body mass index is 34.1 kg/m.  Physical Exam   Gen: patient lying in bed, NAD CV: extremities appear well-perfused Resp: normal WOB  Neurologic exam MS: alert, oriented to self and daughter at baseline Speech: mild dysarthria, impaired naming and repetition CN: PERRL, VFF, EOMI, sensation intact, face symmetric, hearing intact to voice Motor: squeezes hands and wiggles toes bilat Sensory: SILT Coordination: UTA Gait: deferred   Medications  Current Facility-Administered Medications:    0.9 %  sodium chloride  infusion, , Intravenous, Continuous, Shona Terry SAILOR, DO, Last Rate: 50 mL/hr at 01/30/24 1656, New Bag at 01/30/24 1656   acetaminophen  (TYLENOL ) tablet 650 mg, 650 mg, Oral, Q6H PRN, Hall, Carole N, DO   enoxaparin  (LOVENOX ) injection 40 mg, 40 mg, Subcutaneous, Q24H, Hall, Carole N, DO, 40 mg at 01/30/24 1050   LORazepam  (ATIVAN ) injection 2 mg, 2 mg, Intravenous, Once, Patt Alm Macho, MD   melatonin tablet 5 mg, 5 mg, Oral, QHS PRN, Shona, Carole N, DO   polyethylene glycol (MIRALAX  / GLYCOLAX ) packet 17 g, 17 g, Oral, Daily PRN, Hall, Carole N, DO   prochlorperazine  (COMPAZINE ) injection 5 mg, 5 mg, Intravenous, Q6H PRN, Hall, Carole N, DO   sodium chloride  0.9 % bolus 250 mL, 250 mL, Intravenous, Once, Rosario Eland I, MD   valproate (DEPACON ) 250 mg in dextrose  5 % 50 mL IVPB, 250 mg, Intravenous,  Q6H, Matthews Elida HERO, MD  Labs and Diagnostic Imaging   CBC:  Recent Labs  Lab 01/29/24 2015 01/30/24 0408  WBC 9.0 9.0  NEUTROABS 6.6  --   HGB 14.6  13.5 13.6  HCT 43.0  41.3 41.4  MCV 92.4 92.6  PLT 189 236    Basic Metabolic Panel:  Lab Results  Component Value Date   NA 138 01/30/2024   K 4.3 01/30/2024   CO2 25 01/30/2024   GLUCOSE 106 (H) 01/30/2024   BUN 13 01/30/2024   CREATININE 1.05 (H) 01/30/2024   CALCIUM  9.7 01/30/2024   GFRNONAA 56 (L) 01/30/2024   GFRAA 40 (L) 01/01/2020   Lipid Panel:  Lab Results  Component Value Date   LDLCALC 135 (H) 11/28/2021   HgbA1c:  Lab Results  Component Value Date   HGBA1C 5.8 10/08/2023   Urine Drug Screen:     Component Value Date/Time   LABOPIA NONE DETECTED 01/29/2024 2135   COCAINSCRNUR NONE DETECTED 01/29/2024 2135   COCAINSCRNUR NONE DETECTED 07/20/2022 0250   LABBENZ NONE DETECTED 01/29/2024 2135   AMPHETMU NONE DETECTED 01/29/2024 2135   THCU NONE DETECTED 01/29/2024 2135   LABBARB NONE DETECTED 01/29/2024 2135    Alcohol Level     Component Value Date/Time   [facility] <15 01/29/2024 2015   INR  Lab Results  Component Value Date   INR 1.0 01/30/2024  APTT  Lab Results  Component Value Date   APTT 25 01/30/2024   AED levels: No results found for: PHENYTOIN, ZONISAMIDE, LAMOTRIGINE, LEVETIRACETA  See imaging documentation below  LTM EEG  ABNORMALITY - Status epilepticus, left frontal region - Seizure without clinical signs, left frontal region  - Continuous slow, generalized   IMPRESSION: At the beginning of the study, seizure was ongoing. EEG then showed three more seizure without clinical signs arising from left frontal region on 01/30/2024 at 0041, 0045, 0047, lasting about 1-2 minutes each.    At around 0058, EEG worsened and patient went to status epilepticus arising from left frontal region. Initially, no clinical signs were noted. At around 0129, patient was noted to have  right gaze deviation followed by generalized stiffening. Seizure ended at 0131.    Additionally there is moderate diffuse encephalopathy.  Assessment   73 y.o. female with a PMHx of bipolar 1 disorder, depression, hepatitis C, hypercholesterolemia, HTN and seizures (on Depakote ) who presents to the ED from home via EMS as a Code Stroke. LKN was 1900 when granddaughter saw the patient outside in the front yard acting normally. When granddaughter went back outside, the patient was wandering in the road in front of the house confused. She was brought inside to sit on couch and then had a seizure lasting approximately 3 minutes. On EMS arrival, the patient had left sided weakness, loss of sensation on the left and right sided gaze preference. EMS VS 192/118, HR 96, cbg 124, RR 20. On arrival to the ED, the patient was alert and oriented to self and time. Her last seizure per granddaughter was in January. The patient also had a stroke in January and has residual left sided weakness from this. The patient endorses that her seizures started about 5-10 years ago, information that required repeated questions with multiple choice format to obtain.  - Exam on admission reveals left sided weakness, left hemisensory deficit, poor orientation and equivocal intermittent complex partial seizure activity with transient gaze and head turning to the left, during which she has significantly decreased responsiveness to questions and commands.  - CT head: Motion limited study without evidence of acute intracranial abnormality. ASPECTS is 10. - CTA of head and neck: No emergent large vessel occlusion or proximal hemodynamically significant stenosis. 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 Aortic atherosclerosis. - Impression:  - Breakthrough seizure activity manifesting as intermittent complex partial seizures. The semiology is most consistent with a seizure focus in her right hemisphere. 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 We will increase her home depakote  for now  Recommendations   - Continue LTM EEG - Increase depakote  to 250mg  q 6 IV - Inpatient seizure precautions - Outpatient seizure precautions: Per [place]  DMV statutes, patients with seizures are not allowed to drive until  they have been seizure-free for six months. Use caution when using heavy equipment or power tools. Avoid working on ladders or at heights. Take showers instead of baths. Ensure the water  temperature is not too high on the home water  heater. Do not go swimming alone. When caring for infants or small children, sit down when holding, feeding, or changing them to minimize risk of injury to the child in the event you have a seizure. Also, Maintain good sleep hygiene. Avoid alcohol. - Ativan  2 mg IV PRN seizure recurrence and call Neurology  Neurology will continue to follow  ______________________________________________________________________   Signed, Elida CHRISTELLA Ross, MD [facility]

## 2024-01-30 NOTE — ED Notes (Signed)
 Pt passed swallow screen efficiently as is AxO4. No complaints of pain.

## 2024-01-30 NOTE — Code Documentation (Signed)
 Stroke Response Nurse Documentation Code Documentation  Shelby Jordan is a 73 y.o. female arriving to [facility]  via [facility] on 7/12 with past medical hx of Bipolar, seizures, HTN, depression. On No antithrombotic. Code stroke was activated by EMS.   Patient from home where she was LKW at 1900 and now complaining of AMS post seizure.   Stroke team at the bedside on patient arrival. Labs drawn and patient cleared for CT by Dr. Patt. Patient to CT with team. NIHSS 8, see documentation for details and code stroke times. Patient with decreased LOC, disoriented, right gaze preference , left leg weakness, left decreased sensation, dysarthria , and left neglect on exam. The following imaging was completed:  CT Head and CTA. Patient is not a candidate for IV Thrombolytic due to presentation most consistent with seizure. Patient is not a candidate for IR due to No LVO.      Bedside handoff with ED RN Donna.    Griselda Alm ORN  Rapid Response RN

## 2024-01-30 NOTE — Care Management Obs Status (Signed)
 MEDICARE OBSERVATION STATUS NOTIFICATION   Patient Details  Name: Shelby Jordan MRN: [id number] Date of Birth: 10/01/1950   Medicare Observation Status Notification Given:  Yes    Corean JAYSON Canary, RN 01/30/2024, 11:55 AM

## 2024-01-30 NOTE — Plan of Care (Signed)
  Problem: Health Behavior/Discharge Planning: Goal: Ability to manage health-related needs will improve Outcome: Progressing   Problem: Clinical Measurements: Goal: Ability to maintain clinical measurements within normal limits will improve Outcome: Progressing   Problem: Nutrition: Goal: Adequate nutrition will be maintained Outcome: Progressing   Problem: Activity: Goal: Risk for activity intolerance will decrease Outcome: Progressing

## 2024-01-30 NOTE — ED Notes (Signed)
 Pt began seizing, EDP to bedside, verbal order for 2mg  Ativan  administered. Pt seizing stopped and suction at bedside.

## 2024-01-30 NOTE — Progress Notes (Signed)
 vLTM maintenance  [facility] notified  No skin breakdown noted at FP1 FP2  F3  FZ F4  Moved pt from ED to [phone]

## 2024-01-31 ENCOUNTER — Inpatient Hospital Stay (HOSPITAL_COMMUNITY)

## 2024-01-31 ENCOUNTER — Other Ambulatory Visit (HOSPITAL_COMMUNITY): Payer: Self-pay

## 2024-01-31 ENCOUNTER — Telehealth (HOSPITAL_COMMUNITY): Payer: Self-pay | Admitting: Pharmacy Technician

## 2024-01-31 DIAGNOSIS — R569 Unspecified convulsions: Secondary | ICD-10-CM | POA: Diagnosis not present

## 2024-01-31 DIAGNOSIS — G40909 Epilepsy, unspecified, not intractable, without status epilepticus: Secondary | ICD-10-CM | POA: Diagnosis not present

## 2024-01-31 MED ORDER — POLYETHYLENE GLYCOL 3350 17 G PO PACK
17.0000 g | PACK | Freq: Once | ORAL | Status: AC
  Administered 2024-01-31: 17 g via ORAL
  Filled 2024-01-31: qty 1

## 2024-01-31 MED ORDER — MIRTAZAPINE 15 MG PO TABS
30.0000 mg | ORAL_TABLET | Freq: Every day | ORAL | 0 refills | Status: DC
  Filled 2024-01-31: qty 60, 30d supply, fill #0

## 2024-01-31 MED ORDER — DIVALPROEX SODIUM 500 MG PO DR TAB
500.0000 mg | DELAYED_RELEASE_TABLET | Freq: Two times a day (BID) | ORAL | 0 refills | Status: AC
  Filled 2024-01-31: qty 60, 30d supply, fill #0

## 2024-01-31 MED ORDER — DIVALPROEX SODIUM 500 MG PO DR TAB: 500.0000 mg | DELAYED_RELEASE_TABLET | Freq: Two times a day (BID) | ORAL | Status: DC

## 2024-01-31 MED ORDER — DIAZEPAM (20 MG DOSE) 2 X 10 MG/0.1ML NA LQPK
20.0000 mg | NASAL | 3 refills | Status: AC | PRN
  Filled 2024-01-31: qty 5, 30d supply, fill #0

## 2024-01-31 NOTE — Progress Notes (Signed)
 Discharge instructions given to daughter and patient. Will get meds at [facility]. PAtient with no complaints at the current time. Will discharge via WC.

## 2024-01-31 NOTE — Procedures (Addendum)
 Patient Name: Shelby Jordan  MRN: [id number]  Epilepsy Attending: Arlin MALVA Krebs  Referring Physician/Provider: Merrianne Locus, MD  Duration:  01/30/2024 0034 to 01/31/2024 1111   Patient history: 73 y.o. female hx of HTN, seizure here presenting with possible seizure. EEG to evaluate for seizure   Level of alertness: Awake, asleep   AEDs during EEG study: VPA   Technical aspects: This EEG study was done with scalp electrodes positioned according to the 10-20 International system of electrode placement. Electrical activity was reviewed with band pass filter of 1-70Hz , sensitivity of 7 uV/mm, display speed of 73mm/sec with a 60Hz  notched filter applied as appropriate. EEG data were recorded continuously and digitally stored.  Video monitoring was available and reviewed as appropriate.   Description: The posterior dominant rhythm consists of 9-10 Hz activity of moderate voltage (25-35 uV) seen predominantly in posterior head regions, symmetric and reactive to eye opening and eye closing. Sleep was characterized by sleep spindles (12-14Hz ), maximal fronto-central region. EEG showed intermittent generalized 3 to 6 Hz theta-delta slowing. Hyperventilation and photic stimulation were not performed.      ABNORMALITY - Intermittent slow, generalized   IMPRESSION: This study showed moderate diffuse encephalopathy. No seizures or definite epileptiform discharges were seen.   Jeanee Fabre O Dannie Hattabaugh

## 2024-01-31 NOTE — Telephone Encounter (Signed)
 Patient Product/process development scientist completed.    The patient is insured through [employer]. Patient has Medicare and is not eligible for a copay card, but may be able to apply for patient assistance or Medicare RX Payment Plan (Patient Must reach out to their plan, if eligible for payment plan), if available.    Ran test claim for Valtoco 20 mg and Requires Prior Authorization   This test claim was processed through [employer]- copay amounts may vary at other pharmacies due to [employer], or as the patient moves through the different stages of their insurance plan.     Roland Earl, CPHT Pharmacy Technician III Certified Patient Advocate [facility] Patient Advocate Team Direct Number: [phone]  Fax: [phone]

## 2024-01-31 NOTE — TOC Initial Note (Addendum)
 Transition of Care [facility]) - Initial/Assessment Note    Patient Details  Name: Shelby Jordan MRN: [id number] Date of Birth: Dec 03, 1950  Transition of Care [facility]) CM/SW Contact:    Robynn Eileen Hoose, RN Phone Number: 01/31/2024, 3:22 PM  Clinical Narrative:   Spoke with patient at bedside, reports that she lives with her daughter. Patient confirmed that she has a walker and wheelchair at home. Per [employer], patient has used [facility] in past (09/2023). Patient ambulatory with a shuffling gate, awaiting PT/OT eval.     1545: Provider reports patient needs HH PT OT. Referral sent to [facility] with [facility], awaiting response.       1558: [facility] able to accept for PT only, ok per provider. Contact information placed on AVS.     1654: Secure message from therapy that patient needs BSC/3:1. DME ordered through [facility] with [employer], to be delivered to patient home.   Expected Discharge Plan: Home/Self Care Barriers to Discharge: Continued Medical Work up   Patient Goals and CMS Choice            Expected Discharge Plan and Services       Living arrangements for the past 2 months: Single Family Home                                      Prior Living Arrangements/Services Living arrangements for the past 2 months: Single Family Home Lives with:: Adult Children Patient language and need for interpreter reviewed:: Yes Do you feel safe going back to the place where you live?: Yes      Need for Family Participation in Patient Care: Yes (Comment) Care giver support system in place?: Yes (comment) Current home services: DME Marketing executive) Criminal Activity/Legal Involvement Pertinent to Current Situation/Hospitalization: No - Comment as needed  Activities of Daily Living   ADL Screening (condition at time of admission) Independently performs ADLs?: No Does the patient have a NEW difficulty with bathing/dressing/toileting/self-feeding that is expected to last >3 days?: Yes  (Initiates electronic notice to provider for possible OT consult) Does the patient have a NEW difficulty with getting in/out of bed, walking, or climbing stairs that is expected to last >3 days?: Yes (Initiates electronic notice to provider for possible PT consult) Does the patient have a NEW difficulty with communication that is expected to last >3 days?: No Is the patient deaf or have difficulty hearing?: No Does the patient have difficulty seeing, even when wearing glasses/contacts?: No Does the patient have difficulty concentrating, remembering, or making decisions?: No  Permission Sought/Granted                  Emotional Assessment Appearance:: Appears stated age Attitude/Demeanor/Rapport: Engaged Affect (typically observed): Appropriate Orientation: : Oriented to Self, Oriented to Place, Oriented to  Time Alcohol / Substance Use: Not Applicable Psych Involvement: No (comment)  Admission diagnosis:  Seizure (HCC) [R56.9] Seizure-like activity (HCC) [R56.9] Patient Active Problem List   Diagnosis Date Noted   Seizure-like activity (HCC) 01/29/2024   Cold hands and feet 10/14/2023   Constipation 10/14/2023   Parkinsonism (HCC) 10/08/2023   Right hemiparesis (HCC) 09/02/2023   Nosocomial pneumonia 08/16/2023   Seizure disorder (HCC) 08/10/2023   Seizure (HCC) 08/08/2023   Leukocytosis 08/08/2023   Metabolic acidosis with increased anion gap and accumulation of organic acids 08/08/2023   Hypokalemia 08/08/2023   Poor personal hygiene 04/19/2023   Tobacco abuse  04/15/2023   Urge incontinence of urine 02/09/2023   Moderate persistent asthma 12/15/2022   Vitamin D  deficiency 12/15/2022   Primary hypertension 12/15/2022   Mixed stress and urge urinary incontinence 12/10/2022   Bipolar 1 disorder (HCC) 07/20/2022   Self-care deficit for dressing and grooming 06/28/2022   At risk for self care deficit 06/28/2022   Uterine leiomyoma 05/07/2022   Atherosclerosis of aorta  (HCC) 05/04/2022   Bilateral inguinal hernia without obstruction or gangrene 05/04/2022   Abnormal MMSE 03/05/2022   Memory loss 03/05/2022   Osteopenia of left hip 02/02/2022   Schizoaffective disorder, bipolar type (HCC) 11/28/2021   Hyperglycemia 11/28/2021   History of hepatitis C 11/28/2021   Stage 3b chronic kidney disease (HCC) 11/28/2021   Obesity (BMI 35.0-39.9 without comorbidity) 11/28/2021   Chronic obstructive pulmonary disease (HCC) 11/28/2021   Mixed hyperlipidemia 11/28/2021   Post-menopausal 11/28/2021   Left lower quadrant abdominal mass 11/28/2021   History of tobacco abuse 11/28/2021   Bipolar I disorder, most recent episode (or current) manic (HCC) 12/15/2019   Essential hypertension 12/15/2019   PCP:  Jimmy Charlie FERNS, MD Pharmacy:   [id number] [address] [place] [address] [place] [address] Phone: [phone] Fax: [phone]  [facility] [address] [place] [address] Phone: [phone] Fax: [phone]     Social Drivers of Health (SDOH) Social History: SDOH Screenings   Food Insecurity: No Food Insecurity (01/30/2024)  Housing: Low Risk  (01/30/2024)  Transportation Needs: No Transportation Needs (01/30/2024)  Utilities: Not At Risk (01/30/2024)  Alcohol Screen: Low Risk  (08/26/2023)  Depression (PHQ2-9): Low Risk  (08/26/2023)  Financial Resource Strain: Low Risk  (08/26/2023)  Physical Activity: Inactive (08/26/2023)  Social Connections: Socially Isolated (01/30/2024)  Stress: No Stress Concern Present (08/26/2023)  Tobacco Use: Medium Risk (11/08/2023)  Health Literacy: Inadequate Health Literacy (08/26/2023)   SDOH Interventions:     Readmission Risk Interventions     No data to display

## 2024-01-31 NOTE — Progress Notes (Signed)
 Subjective: No acute events overnight.  Pretty awake and seems like almost back to baseline today.  ROS: negative except above  Examination  Vital signs in last 24 hours: Temp:  [98.1 F (36.7 C)-99.3 F (37.4 C)] 99.3 F (37.4 C) (07/14 0800) Pulse Rate:  [61-79] 77 (07/14 0800) Resp:  [16-24] 17 (07/14 0800) BP: (76-130)/(41-79) 102/50 (07/14 0800) SpO2:  [96 %-100 %] 96 % (07/14 0800) Weight:  [90.1 kg] 90.1 kg (07/13 1622)  General: lying in bed, NAD Neuro: MS: Alert, oriented to place and person time: May, follows commands CN: pupils equal and reactive,  EOMI, face symmetric, tongue midline, normal sensation over face, Motor: 5/5 strength in right upper extremity, 4/5 in left upper extremity, 4/5 in bilateral lower extremities Coordination: normal Gait: not tested  Basic Metabolic Panel: Recent Labs  Lab 01/29/24 2015 01/30/24 0408  NA 137  137 138  K 5.0  5.0 4.3  CL 105  104 104  CO2 21* 25  GLUCOSE 135*  138* 106*  BUN 18  24* 13  CREATININE 1.26*  1.20* 1.05*  CALCIUM  9.8 9.7  MG  --  1.8  PHOS  --  3.6    CBC: Recent Labs  Lab 01/29/24 2015 01/30/24 0408  WBC 9.0 9.0  NEUTROABS 6.6  --   HGB 14.6  13.5 13.6  HCT 43.0  41.3 41.4  MCV 92.4 92.6  PLT 189 236     Coagulation Studies: Recent Labs    01/29/24 2015 01/30/24 0110  LABPROT 13.7 13.6  INR 1.0 1.0    Imaging personally reviewed  CT head without contrast 01/29/2024: No acute abnormality.  CTA head and neck with and without contrast 01/29/2024: No large vessel occlusion.  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  Epilepsy with breakthrough seizure - Normal/therapeutic Depakote  level, no evidence of UTI or any other infection. - Increased Depakote  to IV 250mg  Q6h with no further recurrence and seizure activity - Therefore  recommend switching to Depakote  DR 500 mg twice daily -Rescue medication: Recommend Valtoco  20 mg for seizure lasting more than 2 minutes.  If not covered by insurance, can prescribe clonazepam 2 mg oral dissolving tablets instead - DC LTM EEG - PT/OT - Follow-up with Dr Gregg at [facility] in 2 months - Discussed plan with Dr. Sherlon via secure chat  Seizure precautions: Per [place]  DMV statutes, patients with seizures are not allowed to drive until they have been seizure-free for six months and cleared by a physician    Use caution when using heavy equipment or power tools. Avoid working on ladders or at heights. Take showers instead of baths. Ensure the water  temperature is not too high on the home water  heater. Do not go swimming alone. Do not lock yourself in a room alone (i.e. bathroom). When caring for infants or small children, sit down when holding, feeding, or changing them to minimize risk of injury to the child in the event you have a seizure. Maintain good sleep hygiene. Avoid alcohol.    If patient has another seizure, call 911 and bring them back to the ED if: A.  The seizure lasts longer than 5 minutes.      B.  The patient doesn't wake shortly after the seizure or has new problems such as difficulty seeing, speaking or moving following the seizure C.  The  patient was injured during the seizure D.  The patient has a temperature over 102 F (39C) E.  The patient vomited during the seizure and now is having trouble breathing    During the Seizure   - First, ensure adequate ventilation and place patients on the floor on their left side  Loosen clothing around the neck and ensure the airway is patent. If the patient is clenching the teeth, do not force the mouth open with any object as this can cause severe damage - Remove all items from the surrounding that can be hazardous. The patient may be oblivious to what's happening and may not even know what he  or she is doing. If the patient is confused and wandering, either gently guide him/her away and block access to outside areas - Reassure the individual and be comforting - Call 911. In most cases, the seizure ends before EMS arrives. However, there are cases when seizures may last over 3 to 5 minutes. Or the individual may have developed breathing difficulties or severe injuries. If a pregnant patient or a person with diabetes develops a seizure, it is prudent to call an ambulance.   After the Seizure (Postictal Stage)   After a seizure, most patients experience confusion, fatigue, muscle pain and/or a headache. Thus, one should permit the individual to sleep. For the next few days, reassurance is essential. Being calm and helping reorient the person is also of importance.   Most seizures are painless and end spontaneously. Seizures are not harmful to others but can lead to complications such as stress on the lungs, brain and the heart. Individuals with prior lung problems may develop labored breathing and respiratory distress.       I personally spent a total of 37 minutes in the care of the patient today including getting/reviewing separately obtained history, performing a medically appropriate exam/evaluation, counseling and educating, placing orders, referring and communicating with other health care professionals, documenting clinical information in the EHR, independently interpreting results, and coordinating care.          Arlin Krebs Epilepsy [facility] For questions after 5pm please refer to AMION to reach the Neurologist on call

## 2024-01-31 NOTE — Progress Notes (Signed)
LTM EEG disconnected - no skin breakdown at unhook. [facility] notified.  

## 2024-01-31 NOTE — Evaluation (Addendum)
 Physical Therapy Evaluation Patient Details Name: Shelby Jordan MRN: [id number] DOB: Mar 10, 1951 Today's Date: 01/31/2024  History of Present Illness  Pt is a 73 y.o. F who presents 01/29/2024 with seizure like activity. CTH with motion limited study without evidence of acute intracranial abnormality. ASPECTS is 10. PMH includes HTN, HLD, bipolar disorder, CKD III.  Clinical Impression  PTA, pt lives with her daughter in a two level house, reports she is independent with ADL's and has assist for IADL's. Pt presents with impaired standing balance, decreased endurance, and gait abnormalities. Pt ambulating 35 ft with no AD vs RW with shuffling, festinating gait pattern and required one seated rest break due to fatigue. HR up to 129 bpm. Discussed with pt granddaughter who reports she will have available assist. Pt does have a wheelchair available at home for longer distance navigation. Recommend HHPT at d/c to address deficits and maximize functional mobility.         If plan is discharge home, recommend the following: A little help with walking and/or transfers;A little help with bathing/dressing/bathroom;Assistance with cooking/housework;Assist for transportation;Help with stairs or ramp for entrance;Supervision due to cognitive status   Can travel by private vehicle        Equipment Recommendations BSC/3in1  Recommendations for Other Services       Functional Status Assessment Patient has had a recent decline in their functional status and demonstrates the ability to make significant improvements in function in a reasonable and predictable amount of time.     Precautions / Restrictions Precautions Precautions: Fall Restrictions Weight Bearing Restrictions Per Provider Order: No      Mobility  Bed Mobility Overal bed mobility: Needs Assistance Bed Mobility: Sit to Supine       Sit to supine: Supervision        Transfers Overall transfer level: Needs assistance Equipment  used: None Transfers: Sit to/from Stand Sit to Stand: Supervision           General transfer comment: Good power up    Ambulation/Gait Ambulation/Gait assistance: Contact guard assist, Min assist Gait Distance (Feet): 35 Feet Assistive device: Rolling walker (2 wheels), None Gait Pattern/deviations: Step-through pattern, Decreased stride length, Decreased dorsiflexion - right, Decreased dorsiflexion - left, Shuffle, Festinating Gait velocity: decreased Gait velocity interpretation: <1.31 ft/sec, indicative of household ambulator   General Gait Details: Festinating gait pattern with intermittent freezing, unable to alter much with cues for larger steps. Required seated rest break upon re-entrance into room.  Stairs            Wheelchair Mobility     Tilt Bed    Modified Rankin (Stroke Patients Only)       Balance Overall balance assessment: Needs assistance Sitting-balance support: Feet supported Sitting balance-Leahy Scale: Good     Standing balance support: No upper extremity supported, During functional activity Standing balance-Leahy Scale: Poor Standing balance comment: Poor dynamically                             Pertinent Vitals/Pain Pain Assessment Pain Assessment: No/denies pain    Home Living Family/patient expects to be discharged to:: Private residence Living Arrangements: Children Available Help at Discharge: Family;Available PRN/intermittently Type of Home: House Home Access: Stairs to enter Entrance Stairs-Rails: None Entrance Stairs-Number of Steps: 1 Alternate Level Stairs-Number of Steps: flight Home Layout: Two level;1/2 bath on main level;Bed/bath upstairs Home Equipment: Agricultural consultant (2 wheels);Wheelchair - Manufacturing systems engineer      Prior  Function Prior Level of Function : Needs assist             Mobility Comments: ambulatory without AD, 2 falls in past 3 months ADLs Comments: family gives reminders for  medications, daughter cooks and does laundry. Pt reports she can cook up a few things for yourself.     Extremity/Trunk Assessment   Upper Extremity Assessment Upper Extremity Assessment: Defer to OT evaluation    Lower Extremity Assessment Lower Extremity Assessment: Generalized weakness (functionally weak)    Cervical / Trunk Assessment Cervical / Trunk Assessment: Normal  Communication   Communication Communication: No apparent difficulties    Cognition Arousal: Alert Behavior During Therapy: WFL for tasks assessed/performed   PT - Cognitive impairments: History of cognitive impairments                       PT - Cognition Comments: Pt with no recall of events leading up to hospitalization Following commands: Intact       Cueing Cueing Techniques: Verbal cues     General Comments      Exercises     Assessment/Plan    PT Assessment Patient needs continued PT services  PT Problem List Decreased strength;Decreased activity tolerance;Decreased balance;Decreased mobility;Decreased safety awareness       PT Treatment Interventions DME instruction;Stair training;Gait training;Functional mobility training;Therapeutic activities;Therapeutic exercise;Balance training;Patient/family education    PT Goals (Current goals can be found in the Care Plan section)  Acute Rehab PT Goals Patient Stated Goal: to go home PT Goal Formulation: With patient/family Time For Goal Achievement: 02/14/24 Potential to Achieve Goals: Good    Frequency Min 2X/week     Co-evaluation               AM-PAC PT 6 Clicks Mobility  Outcome Measure Help needed turning from your back to your side while in a flat bed without using bedrails?: A Little Help needed moving from lying on your back to sitting on the side of a flat bed without using bedrails?: A Little Help needed moving to and from a bed to a chair (including a wheelchair)?: A Little Help needed standing up from  a chair using your arms (e.g., wheelchair or bedside chair)?: A Little Help needed to walk in hospital room?: A Little Help needed climbing 3-5 steps with a railing? : Total 6 Click Score: 16    End of Session Equipment Utilized During Treatment: Gait belt Activity Tolerance: Patient tolerated treatment well Patient left: in bed;with call bell/phone within reach;with bed alarm set Nurse Communication: Mobility status PT Visit Diagnosis: Unsteadiness on feet (R26.81);Other abnormalities of gait and mobility (R26.89);Difficulty in walking, not elsewhere classified (R26.2)    Time: 8546-8478 PT Time Calculation (min) (ACUTE ONLY): 28 min   Charges:   PT Evaluation $PT Eval Low Complexity: 1 Low PT Treatments $Therapeutic Activity: 8-22 mins PT General Charges $$ ACUTE PT VISIT: 1 Visit         Aleck Daring, PT, DPT Acute Rehabilitation Services Office [phone]   Alayne ONEIDA Daring 01/31/2024, 4:10 PM

## 2024-01-31 NOTE — Telephone Encounter (Signed)
 Pharmacy Patient Advocate Encounter   Received notification from Inpatient Request that prior authorization for Valtoco  20 MG Dose 2 x 10MG /0.1ML liquid is required/requested.   Insurance verification completed.   The patient is insured through [employer] .   Per test claim: PA required; PA submitted to above mentioned insurance via CoverMyMeds Key/confirmation #/[id number] [facility] Status is pending

## 2024-01-31 NOTE — Discharge Instructions (Signed)
 Seizure precautions: Per [facility] statutes, patients with seizures are not allowed to drive until they have been seizure-free for six months and cleared by a physician    Use caution when using heavy equipment or power tools. Avoid working on ladders or at heights. Take showers instead of baths. Ensure the water temperature is not too high on the home water heater. Do not go swimming alone. Do not lock yourself in a room alone (i.e. bathroom). When caring for infants or small children, sit down when holding, feeding, or changing them to minimize risk of injury to the child in the event you have a seizure. Maintain good sleep hygiene. Avoid alcohol.    If patient has another seizure, call 911 and bring them back to the ED if: A.  The seizure lasts longer than 5 minutes.      B.  The patient doesn't wake shortly after the seizure or has new problems such as difficulty seeing, speaking or moving following the seizure C.  The patient was injured during the seizure D.  The patient has a temperature over 102 F (39C) E.  The patient vomited during the seizure and now is having trouble breathing    During the Seizure   - First, ensure adequate ventilation and place patients on the floor on their left side  Loosen clothing around the neck and ensure the airway is patent. If the patient is clenching the teeth, do not force the mouth open with any object as this can cause severe damage - Remove all items from the surrounding that can be hazardous. The patient may be oblivious to what's happening and may not even know what he or she is doing. If the patient is confused and wandering, either gently guide him/her away and block access to outside areas - Reassure the individual and be comforting - Call 911. In most cases, the seizure ends before EMS arrives. However, there are cases when seizures may last over 3 to 5 minutes. Or the individual may have developed breathing difficulties or severe  injuries. If a pregnant patient or a person with diabetes develops a seizure, it is prudent to call an ambulance.    After the Seizure (Postictal Stage)   After a seizure, most patients experience confusion, fatigue, muscle pain and/or a headache. Thus, one should permit the individual to sleep. For the next few days, reassurance is essential. Being calm and helping reorient the person is also of importance.   Most seizures are painless and end spontaneously. Seizures are not harmful to others but can lead to complications such as stress on the lungs, brain and the heart. Individuals with prior lung problems may develop labored breathing and respiratory distress.

## 2024-01-31 NOTE — Plan of Care (Signed)

## 2024-01-31 NOTE — Discharge Summary (Signed)
 Physician Discharge Summary  Shelby Jordan FMW:[id number] DOB: Aug 18, 1950 DOA: 01/29/2024  PCP: Shelby Charlie FERNS, MD  Admit date: 01/29/2024 Discharge date: 01/31/2024  Admitted From: (Home) Disposition:  (Home )  Recommendations for Outpatient Follow-up:  Follow up with PCP in 1-2 weeks Please obtain BMP/CBC in one week Patient to follow-up with neurology in 8 weeks, ambulatory referral has been sent, patient/family aware of seizure precautions  Home Health: (home PT)  Diet recommendation: Heart Healthy  Brief/Interim Summary: Patient is a 73 year old African-American female with past medical history significant for seizure disorder, prior CVA, CKD stage IIIb, bipolar disorder with recent manic episode.  Patient was admitted with hypertensive urgency, encephalopathy and concerns for seizure  Seizures - Neurology input greatly appreciated, patient had LTM EEG, noted for seizure activity, her medications were adjusted, Depakote  DR was increased to 500 mg twice daily, and she was described Valtoco  as well, she was given seizure precautions, and ambulatory referral has been sent to follow-up with Shelby Jordan at [facility] in 37-month. - Stroke ruled out, she had Todd's paralysis, resolved, PT consulted, recommendation  Tobacco abuse, - Patient reports she cut down to 3 cigarettes/day, she was counseled for total cessation  Bipolar disorder - Mirtazapine  has been increased to plan of discharge  CKD stage IIIa - at Baseline  Obesity stage I -Body mass index is 34.1 kg/m.  Hypertension - Resume home meds  Discharge Diagnoses:  Principal Problem:   Seizure-like activity [facility]) Active Problems:   Seizure [facility])    Discharge Instructions  Discharge Instructions     Ambulatory referral to Neurology   Complete by: As directed    An appointment is requested in approximately: 8 weeks   Diet - low sodium heart healthy   Complete by: As directed    Discharge instructions   Complete  by: As directed    Seizure precautions: [place] statutes, patients with seizures are not allowed to drive until they have been seizure-free for six months and cleared by a physician    Use caution when using heavy equipment or power tools. Avoid working on ladders or at heights. Take showers instead of baths. Ensure the water  temperature is not too high on the home water  heater. Do not go swimming alone. Do not lock yourself in a room alone (i.e. bathroom). When caring for infants or small children, sit down when holding, feeding, or changing them to minimize risk of injury to the child in the event you have a seizure. Maintain good sleep hygiene. Avoid alcohol.    If patient has another seizure, call 911 and bring them back to the ED if: A.  The seizure lasts longer than 5 minutes.      B.  The patient doesn't wake shortly after the seizure or has new problems such as difficulty seeing, speaking or moving following the seizure C.  The patient was injured during the seizure D.  The patient has a temperature over 102 F (39C) E.  The patient vomited during the seizure and now is having trouble breathing    During the Seizure   - First, ensure adequate ventilation and place patients on the floor on their left side  Loosen clothing around the neck and ensure the airway is patent. If the patient is clenching the teeth, do not force the mouth open with any object as this can cause severe damage - Remove all items from the surrounding that can be hazardous. The patient may be oblivious to what's happening  and may not even know what he or she is doing. If the patient is confused and wandering, either gently guide him/her away and block access to outside areas - Reassure the individual and be comforting - Call 911. In most cases, the seizure ends before EMS arrives. However, there are cases when seizures may last over 3 to 5 minutes. Or the individual may have developed breathing  difficulties or severe injuries. If a pregnant patient or a person with diabetes develops a seizure, it is prudent to call an ambulance.    After the Seizure (Postictal Stage)   After a seizure, most patients experience confusion, fatigue, muscle pain and/or a headache. Thus, one should permit the individual to sleep. For the next few days, reassurance is essential. Being calm and helping reorient the person is also of importance.   Most seizures are painless and end spontaneously. Seizures are not harmful to others but can lead to complications such as stress on the lungs, brain and the heart. Individuals with prior lung problems may develop labored breathing and respiratory distress.   Increase activity slowly   Complete by: As directed       Allergies as of 01/31/2024       Reactions   Olanzapine  Other (See Comments)   Parkinson-like symptoms   Tomato (diagnostic) Other (See Comments)   Tongue break out.    Citrus Rash        Medication List     STOP taking these medications    amLODipine  5 MG tablet Commonly known as: NORVASC    metoprolol  succinate 50 MG 24 hr tablet Commonly known as: TOPROL -XL   mirabegron  ER 50 MG Tb24 tablet Commonly known as: MYRBETRIQ        TAKE these medications    albuterol  108 (90 Base) MCG/ACT inhaler Commonly known as: VENTOLIN  HFA Inhale 2 puffs into the lungs every 4 (four) hours as needed for shortness of breath.   ammonium lactate  12 % lotion Commonly known as: Amlactin Daily Apply 1 Application topically as needed.   divalproex  500 MG DR tablet Commonly known as: DEPAKOTE  Take 1 tablet (500 mg total) by mouth every 12 (twelve) hours. What changed:  medication strength See the new instructions.   donepezil 5 MG tablet Commonly known as: ARICEPT Take 5 mg by mouth at bedtime.   fluticasone -salmeterol 100-50 MCG/ACT Aepb Commonly known as: Wixela Inhub  Inhale 1 puff into the lungs 2 (two) times daily.   mirtazapine  15  MG tablet Commonly known as: REMERON  Take 2 tablets (30 mg total) by mouth at bedtime.   Multivitamin Women 50+ Tabs Take 1 tablet by mouth daily. Centrum Silver   Valtoco  20 MG Dose 2 x 10 MG/0.1ML Lqpk Generic drug: diazePAM  (20 MG Dose) Place 20 mg into the nose as needed (seizure lasting more than 2 mins).        Allergies  Allergen Reactions   Olanzapine  Other (See Comments)    Parkinson-like symptoms   Tomato (Diagnostic) Other (See Comments)    Tongue break out.    Citrus Rash    Consultations: neurology   Procedures/Studies: Overnight EEG with video Result Date: 01/30/2024 [name], MD     01/31/2024 10:47 AM Patient Name: Shelby Jordan MRN: [id number] Epilepsy Attending: Arlin KIDD Shelton Referring Physician/Provider: Merrianne Locus, MD Duration:  01/29/2024 0034 to 01/30/2024 0034  Patient history: 73 y.o. female hx of HTN, seizure here presenting with possible seizure. EEG to evaluate for seizure  Level of alertness: Awake, asleep  AEDs during EEG study: VPA  Technical aspects: This EEG study was done with scalp electrodes positioned according to the 10-20 International system of electrode placement. Electrical activity was reviewed with band pass filter of 1-70Hz , sensitivity of 7 uV/mm, display speed of 29mm/sec with a 60Hz  notched filter applied as appropriate. EEG data were recorded continuously and digitally stored.  Video monitoring was available and reviewed as appropriate.  Description: The posterior dominant rhythm consists of 9-10 Hz activity of moderate voltage (25-35 uV) seen predominantly in posterior head regions, symmetric and reactive to eye opening and eye closing. Sleep was characterized by sleep spindles (12-14Hz ), maximal fronto-central region. EEG showed continuous generalized 3 to 6 Hz theta-delta slowing.  At the onset of eeg, seizure was ongoing. Seizures were also noted on 01/30/2024 at 0041, 0045, 0047, lasting about 1-2 minutes each. EEG showed   sharp waves in left frontal region. Then 9-10hz  alpha activity was noted in left parieto-occipital region which then evolved in to 3-5hz  theta-delta slowing and involved all of left hemisphere. At around 0058, EEG worsened and patient went to status epilepticus. Initially, no clinical signs were noted. At around 0129, patient was noted to have right gaze deviation followed by generalized stiffening. EEG initially showed sharp waves in left frontal region. Then 9-10hz  alpha activity was noted in left parieto-occipital region which then evolved in to 3-5hz  theta-delta slowing and involved all of left hemisphere.EEG then involved right frontal region. Seizure ended at 0131.  Hyperventilation and photic stimulation were not performed.    ABNORMALITY - Status epilepticus, left frontal region - Seizure without clinical signs, left frontal region - Continuous slow, generalized IMPRESSION: At the beginning of the study, seizure was ongoing. EEG then showed three more seizure without clinical signs arising from left frontal region on 01/30/2024 at 0041, 0045, 0047, lasting about 1-2 minutes each. At around 0058, EEG worsened and patient went to status epilepticus arising from left frontal region. Initially, no clinical signs were noted. At around 0129, patient was noted to have right gaze deviation followed by generalized stiffening. Seizure ended at 0131.  Additionally there is moderate diffuse encephalopathy.  Arlin MALVA Krebs   EEG adult Result Date: 01/29/2024 Krebs Arlin MALVA, MD     01/30/2024  7:57 AM Patient Name: Shelby Jordan MRN: [id number] Epilepsy Attending: Arlin MALVA Krebs Referring Physician/Provider: Patt Alm Macho, MD Duration: 28.38 mins Patient history: 72 y.o. female hx of HTN, seizure here presenting with possible seizure. EEG to evaluate for seizure Level of alertness: Awake AEDs during EEG study: VPA Technical aspects: This EEG study was done with scalp electrodes positioned according to the  10-20 International system of electrode placement. Electrical activity was reviewed with band pass filter of 1-70Hz , sensitivity of 7 uV/mm, display speed of 11mm/sec with a 60Hz  notched filter applied as appropriate. EEG data were recorded continuously and digitally stored.  Video monitoring was available and reviewed as appropriate. Description:  The posterior dominant rhythm consists of 9-10 Hz activity of moderate voltage (25-35 uV) seen predominantly in posterior head regions, symmetric and reactive to eye opening and eye closing. EEG showed continuous generalized 3 to 6 Hz theta-delta slowing.  Two seizures were noted arising from left frontal region.  During seizure, no clinical signs were seen. Onset of seizure was on 01/30/2024 at 0003 and at 0014, total duration about 1 minute 50 seconds and 3 minutes respectively. EEG showed  sharp waves in left frontal region. Then 9-10hz  alpha activity was noted in left parieto-occipital region  which then evolved in to 3-5hz  theta-delta slowing and involved all of left hemisphere  Hyperventilation and photic stimulation were not performed.    ABNORMALITY - Seizure without clinical signs, left frontal region - Continuous slow, generalized  IMPRESSION: This study showed two seizure without clinical signs arising from left frontal region on 01/30/2024 at 0003 and at 0014, lasting about 1 minute 50 seconds and 3 minutes respectively.  Additionally there is moderate diffuse encephalopathy.  Priyanka MALVA Krebs   CT ANGIO HEAD NECK W WO CM (CODE STROKE) Result Date: 01/29/2024 CLINICAL DATA:  Neuro deficit, acute, stroke suspected EXAM: CT ANGIOGRAPHY HEAD AND NECK WITH AND WITHOUT CONTRAST TECHNIQUE: Multidetector CT imaging of the head and neck was performed using the standard protocol during bolus administration of intravenous contrast. Multiplanar CT image reconstructions and MIPs were obtained to evaluate the vascular anatomy. Carotid stenosis measurements (when  applicable) are obtained utilizing NASCET criteria, using the distal internal carotid diameter as the denominator. RADIATION DOSE REDUCTION: This exam was performed according to the departmental dose-optimization program which includes automated exposure control, adjustment of the mA and/or kV according to patient size and/or use of iterative reconstruction technique. CONTRAST:  75mL OMNIPAQUE  IOHEXOL  350 MG/ML SOLN COMPARISON:  Same day CT head. FINDINGS: CTA NECK FINDINGS Aortic arch: Great vessel origins are patent without significant stenosis. Aortic atherosclerosis. Right carotid system: No evidence of dissection, stenosis (50% or greater), or occlusion. Left carotid system: No evidence of dissection, stenosis (50% or greater), or occlusion. Vertebral arteries: Left dominant. No evidence of dissection, stenosis (50% or greater), or occlusion. Skeleton: No evidence of acute abnormality on limited assessment. Rotation of C1 on C2 is most likely positional in the absence of a fixed torticollis. Other neck: No evidence of acute abnormality on limited assessment. Upper chest: Emphysema.  Lung apices are clear. Review of the MIP images confirms the above findings CTA HEAD FINDINGS Anterior circulation: Bilateral intracranial ICAs, MCAs, and ACAs are patent without proximal hemodynamically significant stenosis. Approximately 4 mm outpouching/aneurysm at the origin of the left anterior communicating artery (series 8, image 111 and series 10, image 154). Posterior circulation: Bilateral intradural vertebral arteries, basilar artery and bilateral posterior arteries are patent without proximal hemodynamically significant stenosis. Prominent bilateral posterior communicating arteries and small P1 PCAs, anatomic variant. No aneurysm identified. Venous sinuses: As permitted by contrast timing, patent. Anatomic variants: Described above. Review of the MIP images confirms the above findings IMPRESSION: 1. No emergent large  vessel occlusion or proximal hemodynamically significant stenosis. 2. 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 3. Aortic Atherosclerosis (ICD10-I70.0) and Emphysema (ICD10-J43.9). Electronically Signed   By: Gilmore GORMAN Molt M.D.   On: 01/29/2024 21:20   CT HEAD CODE STROKE WO CONTRAST Result Date: 01/29/2024 CLINICAL DATA:  Code stroke. Neuro deficit, acute, stroke suspected. EXAM: CT HEAD WITHOUT CONTRAST TECHNIQUE: Contiguous axial images were obtained from the base of the skull through the vertex without intravenous contrast. RADIATION DOSE REDUCTION: This exam was performed according to the departmental dose-optimization program which includes automated exposure control, adjustment of the mA and/or kV according to patient size and/or use of iterative reconstruction technique. COMPARISON:  CT head November 02, 2023. FINDINGS: Motion limited study.  Within this limitation: Brain: No evidence of acute large vascular territory infarct, acute hemorrhage, mass lesion, midline shift or hydrocephalus. Similar remote left cerebellar and left basal ganglia lacunar infarct. Similar patchy white matter hypodensities, compatible with chronic microvascular ischemic disease. Vascular: No hyperdense vessel seen. Skull: No acute fracture.  Sinuses/Orbits: Clear sinuses.  No acute orbital findings. Other: No mastoid effusions. ASPECTS [facility] Stroke Program Early CT Score) Total score (0-10 with 10 being normal): 10. IMPRESSION: Motion limited study without evidence of acute intracranial abnormality. ASPECTS is 10. Code stroke imaging results were communicated on 01/29/2024 at 8:29 pm to provider Dr. Lindzen via secure text paging. Electronically Signed   By: Gilmore GORMAN Molt M.D.   On: 01/29/2024 20:29      Subjective: No significant events overnight, she denies any complaints this morning, eager to go home.  Discharge Exam: Vitals:   01/31/24 0800 01/31/24 1248  BP: (!) 102/50 124/68  Pulse:  77 83  Resp: 17 13  Temp: 99.3 F (37.4 C) 98.4 F (36.9 C)  SpO2: 96% 97%   Vitals:   01/31/24 0200 01/31/24 0400 01/31/24 0800 01/31/24 1248  BP:  (!) 127/46 (!) 102/50 124/68  Pulse: 70 76 77 83  Resp: 17 (!) 22 17 13   Temp:  98.3 F (36.8 C) 99.3 F (37.4 C) 98.4 F (36.9 C)  TempSrc:  Axillary Oral Axillary  SpO2: 96% 99% 96% 97%  Weight:      Height:        General: Pt is alert, awake, not in acute distress Cardiovascular: RRR, S1/S2 +, no rubs, no gallops Respiratory: CTA bilaterally, no wheezing, no rhonchi Abdominal: Soft, NT, ND, bowel sounds + Extremities: no edema, no cyanosis    The results of significant diagnostics from this hospitalization (including imaging, microbiology, ancillary and laboratory) are listed below for reference.     Microbiology: No results found for this or any previous visit (from the past 240 hours).   Labs: BNP (last 3 results) No results for input(s): BNP in the last 8760 hours. Basic Metabolic Panel: Recent Labs  Lab 01/29/24 2015 01/30/24 0408  NA 137  137 138  K 5.0  5.0 4.3  CL 105  104 104  CO2 21* 25  GLUCOSE 135*  138* 106*  BUN 18  24* 13  CREATININE 1.26*  1.20* 1.05*  CALCIUM  9.8 9.7  MG  --  1.8  PHOS  --  3.6   Liver Function Tests: Recent Labs  Lab 01/29/24 2015  AST 40  ALT 19  ALKPHOS 42  BILITOT 0.8  PROT 7.4  ALBUMIN 4.1   No results for input(s): LIPASE, AMYLASE in the last 168 hours. No results for input(s): AMMONIA in the last 168 hours. CBC: Recent Labs  Lab 01/29/24 2015 01/30/24 0408  WBC 9.0 9.0  NEUTROABS 6.6  --   HGB 14.6  13.5 13.6  HCT 43.0  41.3 41.4  MCV 92.4 92.6  PLT 189 236   Cardiac Enzymes: No results for input(s): CKTOTAL, CKMB, CKMBINDEX, TROPONINI in the last 168 hours. BNP: Invalid input(s): POCBNP CBG: Recent Labs  Lab 01/29/24 2010  GLUCAP 142*   D-Dimer No results for input(s): DDIMER in the last 72 hours. Hgb A1c No  results for input(s): HGBA1C in the last 72 hours. Lipid Profile No results for input(s): CHOL, HDL, LDLCALC, TRIG, CHOLHDL, LDLDIRECT in the last 72 hours. Thyroid  function studies No results for input(s): TSH, T4TOTAL, T3FREE, THYROIDAB in the last 72 hours.  Invalid input(s): FREET3 Anemia work up No results for input(s): VITAMINB12, FOLATE, FERRITIN, TIBC, IRON, RETICCTPCT in the last 72 hours. Urinalysis    Component Value Date/Time   COLORURINE YELLOW 01/30/2024 2153   APPEARANCEUR CLEAR 01/30/2024 2153   APPEARANCEUR Clear 06/07/2023 1335   LABSPEC 1.017  01/30/2024 2153   PHURINE 6.0 01/30/2024 2153   GLUCOSEU NEGATIVE 01/30/2024 2153   GLUCOSEU NEGATIVE 07/16/2022 1459   HGBUR NEGATIVE 01/30/2024 2153   BILIRUBINUR NEGATIVE 01/30/2024 2153   BILIRUBINUR Negative 06/07/2023 1335   KETONESUR NEGATIVE 01/30/2024 2153   PROTEINUR NEGATIVE 01/30/2024 2153   UROBILINOGEN 0.2 02/11/2023 1440   UROBILINOGEN 0.2 07/16/2022 1459   NITRITE NEGATIVE 01/30/2024 2153   LEUKOCYTESUR NEGATIVE 01/30/2024 2153   Sepsis Labs Recent Labs  Lab 01/29/24 2015 01/30/24 0408  WBC 9.0 9.0   Microbiology No results found for this or any previous visit (from the past 240 hours).   Time coordinating discharge: Over 30 minutes  SIGNED:   Brayton Lye, MD  [facility] 01/31/2024, 3:58 PM Pager   If 7PM-7AM, please contact night-coverage [email or web address] Password [email or web address]

## 2024-01-31 NOTE — Telephone Encounter (Signed)
 Pharmacy Patient Advocate Encounter  Received notification from [facility] that Prior Authorization for Valtoco  20 MG Dose 2 x 10MG /0.1ML liquid  has been APPROVED from 01/31/2024 to 07/19/2024. Ran test claim, Copay is $0.00. This test claim was processed through [facility]- copay amounts may vary at other pharmacies due to pharmacy/plan contracts, or as the patient moves through the different stages of their insurance plan.   PA #/Case ID/Reference #: [id number]

## 2024-01-31 NOTE — Care Management (Cosign Needed)
    Durable Medical Equipment  (From admission, onward)           Start     Ordered   01/31/24 1653  For home use only DME 3 n 1  Once        01/31/24 1653   01/31/24 1653  For home use only DME Bedside commode  Once       Question:  Patient needs a bedside commode to treat with the following condition  Answer:  Weakness   01/31/24 1653           Per therapy request

## 2024-02-01 ENCOUNTER — Telehealth: Payer: Self-pay

## 2024-02-01 NOTE — Patient Instructions (Signed)
 Visit Information  Thank you for taking time to visit with me today. Please don't hesitate to contact me if I can be of assistance to you before our next scheduled telephone appointment.  Our next appointment is by telephone on 02/14/24 at 2 pm  Following is a copy of your care plan:   Goals Addressed             This Visit's Progress    VBCI Transitions of Care (TOC) Care Plan       Problems:  Recent Hospitalization for treatment of seizures Knowledge Deficit Related to seizure and bipolar/ manic state management  Goal:  Over the next 30 days, the patient will not experience hospital readmission  Interventions:  Transitions of Care: Arranged PCP follow-up within 12-14 days (Care Guide Scheduled) Daughter provided crisis management and [place] behavior urgent care contact phone numbers. Advised to call for patients ongoing manic like behavior/ concerns Assessed for ongoing seizure activity Reviewed medications and discussed compliance- confirmed patient has newly prescribed medication. Advised daughter to call and schedule patient follow up visit with psychiatrist.  Due to daughter having her own doctor appointment today advised her to solicit help from family friend to stay with patient while at medical appointment or take patient with her. Offered 30 day TOC program follow up.  Daughter verbally consented to 30 day TOC follow up for patient.  Confirmed daughter has contact phone number for [facility] home health and was advised to call if she doesn't hear from them by tomorrow 02/02/24. Confirmed patient/ daughter has follow up visit with Child psychotherapist. Appointment date 02/02/24.  Advised to call patients insurance and request blood pressure monitor. Once received advised to monitor blood pressure daily notifying primary care provider of blood pressure readings outside of established parameters.    Patient Self Care Activities:  Attend all scheduled provider appointments Call  pharmacy for medication refills 3-7 days in advance of running out of medications Call provider office for new concerns or questions  Take medications as prescribed   Call and schedule follow up visit with psychiatrist. Primary care provider hospital follow up visit is 02/11/24 at 9:30 am. Please arrive 15 minutes early ( 9:15 am) for appointment.  Call patient insurance carrier for assistance with obtaining blood pressure monitor.  Call crisis line and/ or [facility] behavior urgent care center for ongoing symptoms. [phone] or [phone].  Plan:  Telephone follow up appointment with care management team member scheduled for:  02/14/24 at 2 pm The patient has been provided with contact information for the care management team and has been advised to call with any health related questions or concerns.         The patient verbalized understanding of instructions, educational materials, and care plan provided today and agreed to receive a mailed copy of patient instructions, educational materials, and care plan.   The patient has been provided with contact information for the care management team and has been advised to call with any health related questions or concerns.   Please call the care guide team at [phone] if you need to cancel or reschedule your appointment.   Please call the Suicide and Crisis Lifeline: 988 go to [facility] [place] [address], [place] [phone]) if you are experiencing a Mental Health or Behavioral Health Crisis or need someone to talk to.  Arvin Seip RN, BSN, CCM [employer], Population Health Case Manager Phone: [phone]

## 2024-02-01 NOTE — Transitions of Care (Post Inpatient/ED Visit) (Unsigned)
 02/02/2024  Name: Shelby Jordan MRN: [id number] DOB: 1950-11-18  Today's TOC FU Call Status: Today's TOC FU Call Status:: Successful TOC FU Call Completed TOC FU Call Complete Date: 02/01/24 Patient's Name and Date of Birth confirmed.  Transition Care Management Follow-up Telephone Call Date of Discharge: 01/31/24 Discharge Facility: [facility]) Type of Discharge: Inpatient Admission Primary Inpatient Discharge Diagnosis:: Seizure like activity/ seizure How have you been since you were released from the hospital?: Same (Daughter states patient is not having any seizure like activity. She states patient is bioplar and is in a manic state. She states patient has been in a manic state for approximately 1 week. She states patients remeron  was increased.) Any questions or concerns?: Yes Patient Questions/Concerns:: Daughter states she is concerned about patients overall safety.  Daughter states patient is a little hyper, will not sit still and is not sleeping well. Daughter states when patient has these symptoms it causes her blood pressure to be up and puts her more at risk for having a seizure. Patient Questions/Concerns Addressed: Other: (Provided daugthter contact phone numbers for crisis line and [place] [facility] urgent care center.  Advised daughter to call psychiatry office for follow up visit and arranged hospital follow up visit appointment with primary care provider.)  Items Reviewed: Did you receive and understand the discharge instructions provided?: Yes Medications obtained,verified, and reconciled?: Yes (Medications Reviewed) Any new allergies since your discharge?: No Dietary orders reviewed?: Yes Type of Diet Ordered:: heart healthy Do you have support at home?: Yes People in Home [RPT]: child(ren), adult Name of Support/Comfort Primary Source: Shelby Jordan/ daughter  Medications Reviewed Today: Medications Reviewed Today     Reviewed by Lowry Bala  E, RN (Registered Nurse) on 02/01/24 at 1235  Med List Status: <None>   Medication Order Taking? Sig Documenting Provider Last Dose Status Informant  albuterol  (VENTOLIN  HFA) 108 (90 Base) MCG/ACT inhaler [id number]  Inhale 2 puffs into the lungs every 4 (four) hours as needed for shortness of breath.  Patient not taking: Reported on 02/01/2024   Raenelle Donalda HERO, MD  Active Child, Pharmacy Records           Med Note LEOBARDO, NAT Repress Jan 30, 2024  3:46 AM) Kurt about refills or if this is a rescue inhaler.   amLODipine  (NORVASC ) 5 MG tablet [id number] Yes TAKE 1 TABLET (5 MG TOTAL) BY MOUTH DAILY. Corwin Antu, FNP  Active Child, Pharmacy Records  ammonium lactate  (AMLACTIN DAILY) 12 % lotion [id number] Yes Apply 1 Application topically as needed. Tobie Franky SQUIBB, DPM  Active Child, Pharmacy Records  diazePAM , 20 MG Dose, 2 x 10 MG/0.1ML LQPK [id number] Yes Place 20 mg into the nose as needed (seizure lasting more than 2 mins). Yadav, Priyanka O, MD  Active   divalproex  (DEPAKOTE ) 500 MG DR tablet [id number] Yes Take 1 tablet (500 mg total) by mouth every 12 (twelve) hours. Elgergawy, Brayton RAMAN, MD  Active   donepezil (ARICEPT) 5 MG tablet [id number] Yes Take 5 mg by mouth at bedtime. [provider]  Active Child, Pharmacy Records  fluticasone -salmeterol (WIXELA INHUB ) 100-50 MCG/ACT AEPB [id number] Yes Inhale 1 puff into the lungs 2 (two) times daily. Raenelle Donalda HERO, MD  Active Child, Pharmacy Records  metoprolol  succinate (TOPROL -XL) 50 MG 24 hr tablet [id number] Yes TAKE 1 TABLET BY MOUTH EVERY DAY Dugal, Tabitha, FNP  Active Child, Pharmacy Records  mirtazapine  (REMERON ) 15 MG tablet [id number] Yes Take 2 tablets (30 mg total) by  mouth at bedtime. Elgergawy, Brayton RAMAN, MD  Active   Multiple Vitamins-Minerals (MULTIVITAMIN WOMEN 50+) TABS [id number] Yes Take 1 tablet by mouth daily. Centrum Silver [provider]  Active Child, Pharmacy Records            Home Care and  Equipment/Supplies: Were Home Health Services Ordered?: Yes (physical therapy) Name of Home Health Agency:: [facility] Home health Has Agency set up a time to come to your home?: No EMR reviewed for Home Health Orders: Orders present/patient has not received call (refer to CM for follow-up) (Confirmed daughter has contact phone number for [facility] health) Any new equipment or medical supplies ordered?: Yes Name of Medical supply agency?: daughter states patient already has [facility] therefore didn't accept new one that was ordered Were you able to get the equipment/medical supplies?: Yes (daughter states patient already has) Do you have any questions related to the use of the equipment/supplies?: No  Functional Questionnaire: Do you need assistance with bathing/showering or dressing?: Yes Do you need assistance with meal preparation?: Yes Do you need assistance with eating?: No Do you have difficulty maintaining continence: Yes (daughter states patient wears diapers due to urinary incontinence) Do you need assistance with getting out of bed/getting out of a chair/moving?: No Do you have difficulty managing or taking your medications?: Yes  Follow up appointments reviewed: PCP Follow-up appointment confirmed?: Yes Date of PCP follow-up appointment?: 02/11/24 Follow-up Provider: Dr. Charlie Denise Specialist [facility] Follow-up appointment confirmed?: No (patient has appointment scheduled with neurologist 05/30/24) Do you need transportation to your follow-up appointment?: No Do you understand care options if your condition(s) worsen?: Yes-patient verbalized understanding  SDOH Interventions Today    Flowsheet Row Most Recent Value  SDOH Interventions   Food Insecurity Interventions Intervention Not Indicated  Housing Interventions Intervention Not Indicated  Transportation Interventions Intervention Not Indicated  Utilities Interventions Intervention Not Indicated    Goals Addressed              This Visit's Progress    VBCI Transitions of Care (TOC) Care Plan       Problems:  Recent Hospitalization for treatment of seizures Knowledge Deficit Related to seizure and bipolar/ manic state management  Goal:  Over the next 30 days, the patient will not experience hospital readmission  Interventions:  Transitions of Care: Arranged PCP follow-up within 12-14 days (Care Guide Scheduled) Daughter provided crisis management and [place] behavior urgent care contact phone numbers. Advised to call for patients ongoing manic like behavior/ concerns Assessed for ongoing seizure activity Reviewed medications and discussed compliance- confirmed patient has newly prescribed medication. Advised daughter to call and schedule patient follow up visit with psychiatrist.  Due to daughter having her own doctor appointment today advised her to solicit help from family friend to stay with patient while at medical appointment or take patient with her. Offered 30 day TOC program follow up.  Daughter verbally consented to 30 day TOC follow up for patient.  Confirmed daughter has contact phone number for [facility] home health and was advised to call if she doesn't hear from them by tomorrow 02/02/24. Confirmed patient/ daughter has follow up visit with Child psychotherapist. Appointment date 02/02/24.  Advised to call patients insurance and request blood pressure monitor. Once received advised to monitor blood pressure daily notifying primary care provider of blood pressure readings outside of established parameters.  Collaboration with [employer], SW regarding care needs Advised daughter to contact primary provider office and arrange new primary care provider for  patient due to current provider retiring.    Patient Self Care Activities:  Attend all scheduled provider appointments Call pharmacy for medication refills 3-7 days in advance of running out of medications Call provider office for new concerns  or questions  Take medications as prescribed   Call and schedule follow up visit with psychiatrist. Primary care provider hospital follow up visit is 02/11/24 at 9:30 am. Please arrive 15 minutes early ( 9:15 am) for appointment.  Call patient insurance carrier for assistance with obtaining blood pressure monitor.  Call crisis line and/ or [place] behavior urgent care center for ongoing symptoms. [phone] or [phone].  Plan:  Telephone follow up appointment with care management team member scheduled for:  02/14/24 at 2 pm The patient has been provided with contact information for the care management team and has been advised to call with any health related questions or concerns.         Discussed and offered 30 day TOC program.  Patient's daughter, Shelby Jordan agreed to program.  The patient has been provided with contact information for the care management team and has been advised to call with any health -related questions or concerns.  The patient verbalized understanding with current plan of care.  The patient is directed to their insurance card regarding availability of benefits coverage.    Arvin Seip RN, BSN, CCM [employer], Population Health Case Manager Phone: [phone]

## 2024-02-02 ENCOUNTER — Other Ambulatory Visit: Payer: Self-pay | Admitting: *Deleted

## 2024-02-02 ENCOUNTER — Encounter: Admitting: Internal Medicine

## 2024-02-03 ENCOUNTER — Telehealth: Payer: Self-pay

## 2024-02-03 NOTE — Telephone Encounter (Signed)
 Copied from CRM [id number]. Topic: Clinical - Home Health Verbal Orders >> Feb 03, 2024 11:51 AM Chiquita SQUIBB wrote: Caller/Agency: [facility]  Callback Number: [phone] Service Requested: Physical Therapy Frequency: Twice a week for 3 weeks, and 1 a week for 3 weeks Any new concerns about the patient? No

## 2024-02-03 NOTE — Patient Outreach (Signed)
 Complex Care Management   Visit Note  02/03/2024  Name:  Shelby Jordan MRN: [id number] DOB: 1950/09/27  Situation: Referral received for Complex Care Management related to Mental/Behavioral Health diagnosis bipolar disorder I obtained verbal consent from Caregiver.  Visit completed with caregiver  on the phone 02/02/24  Background:   Past Medical History:  Diagnosis Date   Bipolar 1 disorder (HCC)    Depression    Hepatitis C    High cholesterol    Hypertension    Seizures (HCC)    UTI (urinary tract infection)     Assessment: Patient Reported Symptoms:  Cognitive Cognitive Status: Able to follow simple commands, Alert and oriented to person, place, and time   Healing Pattern: Slow Health Facilitated by: Rest  Neurological Neurological Review of Symptoms: No symptoms reported    HEENT HEENT Symptoms Reported: No symptoms reported      Cardiovascular Cardiovascular Symptoms Reported: No symptoms reported    Respiratory Respiratory Symptoms Reported: Shortness of breath Other Respiratory Symptoms: shortness of breath with movement Respiratory Management Strategies: Medication therapy Respiratory Self-Management Outcome: 4 (good)  Endocrine Endocrine Symptoms Reported: No symptoms reported Is patient diabetic?: No    Gastrointestinal Gastrointestinal Symptoms Reported: No symptoms reported      Genitourinary Genitourinary Symptoms Reported: No symptoms reported    Integumentary Integumentary Symptoms Reported: No symptoms reported    Musculoskeletal Musculoskelatal Symptoms Reviewed: Difficulty walking, Unsteady gait Additional Musculoskeletal Details: ability to walk comes and goes        Psychosocial Psychosocial Symptoms Reported: Difficulty concentrating, Irritability, Other Other Psychosocial Conditions: Per patient's daughter, patient is manic, verbally aggressive, arguemntative, not sleeping Behavioral Management Strategies: Medication therapy Behavioral  Health Comment: Folllowed by [employer] video call 3 months ago-daughter will call to schedule follow up appt Major Change/Loss/Stressor/Fears (CP): Medical condition, self Quality of Family Relationships: supportive Do you feel physically threatened by others?: No      02/02/2024   12:31 PM  Depression screen PHQ 2/9  Decreased Interest 0  Down, Depressed, Hopeless 0  PHQ - 2 Score 0    There were no vitals filed for this visit.  Medications Reviewed Today     Reviewed by Ermalinda Lenn HERO, LCSW (Social Worker) on 02/02/24 at 1219  Med List Status: <None>   Medication Order Taking? Sig Documenting Provider Last Dose Status Informant  albuterol  (VENTOLIN  HFA) 108 (90 Base) MCG/ACT inhaler [id number]  Inhale 2 puffs into the lungs every 4 (four) hours as needed for shortness of breath.  Patient not taking: Reported on 02/02/2024   Raenelle Donalda HERO, MD  Active Child, Pharmacy Records           Med Note LEOBARDO, NAT Repress Jan 30, 2024  3:46 AM) Kurt about refills or if this is a rescue inhaler.   amLODipine  (NORVASC ) 5 MG tablet [id number] Yes TAKE 1 TABLET (5 MG TOTAL) BY MOUTH DAILY. Corwin Antu, FNP  Active Child, Pharmacy Records  ammonium lactate  (AMLACTIN DAILY) 12 % lotion [id number] Yes Apply 1 Application topically as needed. Tobie Franky SQUIBB, DPM  Active Child, Pharmacy Records  diazePAM , 20 MG Dose, 2 x 10 MG/0.1ML LQPK [id number] Yes Place 20 mg into the nose as needed (seizure lasting more than 2 mins). Yadav, Priyanka O, MD  Active   divalproex  (DEPAKOTE ) 500 MG DR tablet [id number] Yes Take 1 tablet (500 mg total) by mouth every 12 (twelve) hours. Elgergawy, Brayton RAMAN, MD  Active   donepezil (ARICEPT) 5 MG tablet [id number] Yes  Take 5 mg by mouth at bedtime. [provider]  Active Child, Pharmacy Records  fluticasone -salmeterol (WIXELA INHUB ) 100-50 MCG/ACT AEPB [id number] Yes Inhale 1 puff into the lungs 2 (two) times daily. Raenelle Donalda HERO, MD  Active Child,  Pharmacy Records  metoprolol  succinate (TOPROL -XL) 50 MG 24 hr tablet [id number] Yes TAKE 1 TABLET BY MOUTH EVERY DAY Dugal, Tabitha, FNP  Active Child, Pharmacy Records  mirtazapine  (REMERON ) 15 MG tablet [id number] Yes Take 2 tablets (30 mg total) by mouth at bedtime. Elgergawy, Brayton RAMAN, MD  Active   Multiple Vitamins-Minerals (MULTIVITAMIN WOMEN 50+) TABS [id number] Yes Take 1 tablet by mouth daily. Centrum Silver [provider]  Active Child, Pharmacy Records            Recommendation:   PCP Follow-up  Follow Up Plan:   Telephone follow-up 02/25/24   Lenn Mean, LCSW [place]  [facility] Licensed Clinical Social Worker  Direct Dial: [phone]

## 2024-02-07 ENCOUNTER — Telehealth: Payer: Self-pay

## 2024-02-07 NOTE — Telephone Encounter (Signed)
 Verbal orders given to [facility]. She has an appt here 02-11-24.

## 2024-02-09 ENCOUNTER — Telehealth: Payer: Self-pay

## 2024-02-09 DIAGNOSIS — N1832 Chronic kidney disease, stage 3b: Secondary | ICD-10-CM | POA: Diagnosis not present

## 2024-02-09 DIAGNOSIS — Z89429 Acquired absence of other toe(s), unspecified side: Secondary | ICD-10-CM | POA: Diagnosis not present

## 2024-02-09 DIAGNOSIS — E7800 Pure hypercholesterolemia, unspecified: Secondary | ICD-10-CM | POA: Diagnosis not present

## 2024-02-09 DIAGNOSIS — I129 Hypertensive chronic kidney disease with stage 1 through stage 4 chronic kidney disease, or unspecified chronic kidney disease: Secondary | ICD-10-CM | POA: Diagnosis not present

## 2024-02-09 DIAGNOSIS — Z7951 Long term (current) use of inhaled steroids: Secondary | ICD-10-CM | POA: Diagnosis not present

## 2024-02-09 DIAGNOSIS — Z87440 Personal history of urinary (tract) infections: Secondary | ICD-10-CM | POA: Diagnosis not present

## 2024-02-09 DIAGNOSIS — Z9181 History of falling: Secondary | ICD-10-CM | POA: Diagnosis not present

## 2024-02-09 DIAGNOSIS — Z87891 Personal history of nicotine dependence: Secondary | ICD-10-CM | POA: Diagnosis not present

## 2024-02-09 NOTE — Patient Instructions (Signed)
 Visit Information  Thank you for taking time to visit with me today. Please don't hesitate to contact me if I can be of assistance to you before our next scheduled telephone appointment.  Our next appointment is by telephone on 02/17/24 at 2 pm  Following is a copy of your care plan:   Goals Addressed             This Visit's Progress    VBCI Transitions of Care (TOC) Care Plan       Problems:  Recent Hospitalization for treatment of seizures Knowledge Deficit Related to seizure and bipolar/ manic state management daughter states patient has aide worker 7 days per week 3 hour days.  Goal:  Over the next 30 days, the patient will not experience hospital readmission  Interventions:  Transitions of Care: Arranged PCP follow-up within 12-14 days (Care Guide Scheduled) Assessed for ongoing seizure activity Reviewed medications and discussed compliance-  Advised to call patients insurance and request blood pressure monitor. Once received advised to monitor blood pressure daily notifying primary care provider of blood pressure readings outside of established parameters.  Advised daughter to contact primary provider office and arrange new primary care provider for patient due to current provider retiring.  Assessed for start of care date for home health. Advised to keep follow up appointments with provider Assessed for patients current mental health states   Patient Self Care Activities:  Attend all scheduled provider appointments Call pharmacy for medication refills 3-7 days in advance of running out of medications Call provider office for new concerns or questions  Take medications as prescribed   Primary care provider hospital follow up visit is 02/11/24 at 9:30 am. Please arrive 15 minutes early ( 9:15 am) for appointment.  Call patient insurance carrier for assistance with obtaining blood pressure monitor.  Call crisis line and/ or [place] behavior urgent care center for  ongoing symptoms. [phone] or [phone].  Plan:  Telephone follow up appointment with care management team member scheduled for:  02/17/24 at 2 pm The patient has been provided with contact information for the care management team and has been advised to call with any health related questions or concerns.         The patient verbalized understanding of instructions, educational materials, and care plan provided today and DECLINED offer to receive copy of patient instructions, educational materials, and care plan.   The patient has been provided with contact information for the care management team and has been advised to call with any health related questions or concerns.   Please call the care guide team at [phone] if you need to cancel or reschedule your appointment.   Please call the Suicide and Crisis Lifeline: 988 call 1-800-273-TALK (toll free, 24 hour hotline) if you are experiencing a Mental Health or Behavioral Health Crisis or need someone to talk to.  Arvin Seip RN, BSN, CCM [employer], Population Health Case Manager Phone: [phone]

## 2024-02-09 NOTE — Telephone Encounter (Signed)
 This encounter was created in error - please disregard.

## 2024-02-09 NOTE — Transitions of Care (Post Inpatient/ED Visit) (Signed)
 Transition of Care week 3  Visit Note  02/09/2024  Name: Shelby Jordan MRN: [id number]          DOB: 05-09-1951  Situation: Patient enrolled in [facility] 30-day program. Visit completed with patient by telephone.   Background:   Initial Transition Care Management Follow-up Telephone Call Primary Inpatient Discharge Diagnosis:: Seizure like activity/ seizure  Past Medical History:  Diagnosis Date   Bipolar 1 disorder (HCC)    Depression    Hepatitis C    High cholesterol    Hypertension    Seizures (HCC)    UTI (urinary tract infection)     Assessment: Patient Reported Symptoms: Cognitive Cognitive Status: No symptoms reported, Able to follow simple commands, Alert and oriented to person, place, and time      Neurological Neurological Review of Symptoms: No symptoms reported    HEENT HEENT Symptoms Reported: No symptoms reported      Cardiovascular Cardiovascular Symptoms Reported: No symptoms reported    Respiratory Respiratory Symptoms Reported: No symptoms reported    Endocrine Endocrine Symptoms Reported: No symptoms reported Is patient diabetic?: No    Gastrointestinal Gastrointestinal Symptoms Reported: No symptoms reported      Genitourinary Genitourinary Symptoms Reported: Incontinence Additional Genitourinary Details: Daughter states she has to wash patients clothes and bed linens more frequently due to ongoing urimary incontinence.  Daughter states she is not sure if the depends she is providing patient are thick enough.  Daughter state she does not want to limit patients fluid intake later in the evening because she was told by the doctor that patient needs to stay hydrated. Genitourinary Management Strategies: Incontinence garment/pad (routine follow up)  Integumentary Integumentary Symptoms Reported: No symptoms reported    Musculoskeletal Musculoskelatal Symptoms Reviewed: Difficulty walking, Unsteady gait Additional Musculoskeletal Details: Daughter  states patient is scheduled to have an home health OT/ PT evaluation Musculoskeletal Management Strategies: Routine screening      Psychosocial Psychosocial Symptoms Reported: No symptoms reported Other Psychosocial Conditions: Daughter states patient is doing much better. She states patient is much calmer and compliant.  She states patient is still having some difficulty sleeping however she is not as irritable.  Daughter states she scheduled patient a follow up appointment with her psychiatrist. Daughter states hospital provider prescribed Remeron  30 mg 1 tab 2x per day for patient. Daughter states she did not feel comfortable giving patient this dose so patient is currently taking Remeron  30 mg 1 tablet daily. Daughter states she will discuss this further with patients psychiatrist         There were no vitals filed for this visit.  Medications Reviewed Today     Reviewed by Ardythe Klute E, RN (Registered Nurse) on 02/09/24 at 2134  Med List Status: <None>   Medication Order Taking? Sig Documenting Provider Last Dose Status Informant  albuterol  (VENTOLIN  HFA) 108 (90 Base) MCG/ACT inhaler [id number]  Inhale 2 puffs into the lungs every 4 (four) hours as needed for shortness of breath.  Patient not taking: Reported on 02/02/2024   Raenelle Donalda HERO, MD  Active Child, Pharmacy Records           Med Note LEOBARDO, NAT Repress Jan 30, 2024  3:46 AM) Kurt about refills or if this is a rescue inhaler.   amLODipine  (NORVASC ) 5 MG tablet [id number]  TAKE 1 TABLET (5 MG TOTAL) BY MOUTH DAILY. Corwin Antu, FNP  Active Child, Pharmacy Records  ammonium lactate  (AMLACTIN DAILY) 12 % lotion [id number]  Apply  1 Application topically as needed. Tobie Franky SQUIBB, DPM  Active Child, Pharmacy Records  diazePAM , 20 MG Dose, 2 x 10 MG/0.1ML LQPK [id number]  Place 20 mg into the nose as needed (seizure lasting more than 2 mins). Yadav, Priyanka O, MD  Active   divalproex  (DEPAKOTE ) 500 MG DR tablet [id number]   Take 1 tablet (500 mg total) by mouth every 12 (twelve) hours. Elgergawy, Brayton RAMAN, MD  Active   donepezil (ARICEPT) 5 MG tablet [id number]  Take 5 mg by mouth at bedtime. [provider]  Active Child, Pharmacy Records  fluticasone -salmeterol (WIXELA INHUB ) 100-50 MCG/ACT AEPB [id number]  Inhale 1 puff into the lungs 2 (two) times daily. Raenelle Donalda HERO, MD  Active Child, Pharmacy Records  metoprolol  succinate (TOPROL -XL) 50 MG 24 hr tablet [id number]  TAKE 1 TABLET BY MOUTH EVERY DAY Dugal, Tabitha, FNP  Active Child, Pharmacy Records  mirtazapine  (REMERON ) 15 MG tablet [id number]  Take 2 tablets (30 mg total) by mouth at bedtime. Elgergawy, Brayton RAMAN, MD  Active   Multiple Vitamins-Minerals (MULTIVITAMIN WOMEN 50+) TABS [id number]  Take 1 tablet by mouth daily. Centrum Silver [provider]  Active Child, Pharmacy Records            Goals Addressed             This Visit's Progress    VBCI Transitions of Care (TOC) Care Plan       Problems:  Recent Hospitalization for treatment of seizures Knowledge Deficit Related to seizure and bipolar/ manic state management daughter states patient has aide worker 7 days per week 3 hour days.  Goal:  Over the next 30 days, the patient will not experience hospital readmission  Interventions:  Transitions of Care: Arranged PCP follow-up within 12-14 days (Care Guide Scheduled) Assessed for ongoing seizure activity Reviewed medications and discussed compliance-  Advised to call patients insurance and request blood pressure monitor. Once received advised to monitor blood pressure daily notifying primary care provider of blood pressure readings outside of established parameters.  Advised daughter to contact primary provider office and arrange new primary care provider for patient due to current provider retiring.  Assessed for start of care date for home health. Advised to keep follow up appointments with provider Assessed for  patients current mental health states   Patient Self Care Activities:  Attend all scheduled provider appointments Call pharmacy for medication refills 3-7 days in advance of running out of medications Call provider office for new concerns or questions  Take medications as prescribed   Primary care provider hospital follow up visit is 02/11/24 at 9:30 am. Please arrive 15 minutes early ( 9:15 am) for appointment.  Call patient insurance carrier for assistance with obtaining blood pressure monitor.  Call crisis line and/ or [place] behavior urgent care center for ongoing symptoms. [phone] or [phone].  Plan:  Telephone follow up appointment with care management team member scheduled for:  02/14/24 at 2 pm The patient has been provided with contact information for the care management team and has been advised to call with any health related questions or concerns.          Recommendation:   Continue Current Plan of Care Follow up with primary care provider and psychiatrist  Follow Up Plan:   Telephone follow-up in 1 week  Arvin Seip RN, BSN, CCM [place]  [facility], Population Health Case Manager Phone: [phone]

## 2024-02-11 ENCOUNTER — Encounter: Payer: Self-pay | Admitting: Internal Medicine

## 2024-02-11 ENCOUNTER — Ambulatory Visit: Admitting: Internal Medicine

## 2024-02-11 VITALS — BP 136/80 | HR 66 | Temp 98.8°F | Ht 64.0 in | Wt 200.0 lb

## 2024-02-11 DIAGNOSIS — J449 Chronic obstructive pulmonary disease, unspecified: Secondary | ICD-10-CM | POA: Diagnosis not present

## 2024-02-11 DIAGNOSIS — F3110 Bipolar disorder, current episode manic without psychotic features, unspecified: Secondary | ICD-10-CM

## 2024-02-11 DIAGNOSIS — G40909 Epilepsy, unspecified, not intractable, without status epilepticus: Secondary | ICD-10-CM

## 2024-02-11 DIAGNOSIS — R413 Other amnesia: Secondary | ICD-10-CM | POA: Diagnosis not present

## 2024-02-11 NOTE — Assessment & Plan Note (Signed)
 Limited insight Daughter is limiting her to 2 cigarettes a day Is on advair 

## 2024-02-11 NOTE — Assessment & Plan Note (Signed)
 Started on donepezil from psychiatrist--daughter notes it has helped some

## 2024-02-11 NOTE — Addendum Note (Signed)
 Addended by: HOPE VEVA PARAS on: 02/11/2024 11:52 AM   Modules accepted: Orders

## 2024-02-11 NOTE — Progress Notes (Signed)
 Subjective:    Patient ID: Shelby Jordan, female    DOB: [birth year], 73 y.o.   MRN: [id number]  HPI Here for hospital follow up With daughter  Had seizure---but had been doing well before that Did get set up with psychiatrist Got mirtazapine  and donepezil--these were working well Drove up to [place]---so her sleep was affected. This made her erratic Was trying to get home--went to airport with no ID, etc Still had some agitation When she got home---had seizure (prompting the ER visit and admission) Todd's paralysis that resolved. No worrisome features on CT angio head  Hospitalized for a couple of days EEG did show seizure Depakote  increased Got nasal diazepam  for prn use  Not depressed with--usually does go down after these spells  Current Outpatient Medications on File Prior to Visit  Medication Sig Dispense Refill   albuterol  (VENTOLIN  HFA) 108 (90 Base) MCG/ACT inhaler Inhale 2 puffs into the lungs every 4 (four) hours as needed for shortness of breath. 18 g 1   amLODipine  (NORVASC ) 5 MG tablet TAKE 1 TABLET (5 MG TOTAL) BY MOUTH DAILY. 90 tablet 3   ammonium lactate  (AMLACTIN DAILY) 12 % lotion Apply 1 Application topically as needed. 400 g 0   diazePAM , 20 MG Dose, 2 x 10 MG/0.1ML LQPK Place 20 mg into the nose as needed (seizure lasting more than 2 mins). 5 each 3   divalproex  (DEPAKOTE ) 500 MG DR tablet Take 1 tablet (500 mg total) by mouth every 12 (twelve) hours. 180 tablet 0   donepezil (ARICEPT) 5 MG tablet Take 5 mg by mouth at bedtime.     fluticasone -salmeterol (WIXELA INHUB ) 100-50 MCG/ACT AEPB Inhale 1 puff into the lungs 2 (two) times daily. 60 each 1   metoprolol  succinate (TOPROL -XL) 50 MG 24 hr tablet TAKE 1 TABLET BY MOUTH EVERY DAY 90 tablet 1   mirtazapine  (REMERON ) 30 MG tablet Take 30 mg by mouth at bedtime.     Multiple Vitamins-Minerals (MULTIVITAMIN WOMEN 50+) TABS Take 1 tablet by mouth daily. Centrum Silver     No current facility-administered  medications on file prior to visit.    Allergies  Allergen Reactions   Olanzapine  Other (See Comments)    Parkinson-like symptoms   Tomato (Diagnostic) Other (See Comments)    Tongue break out.    Citrus Rash    Past Medical History:  Diagnosis Date   Bipolar 1 disorder (HCC)    Depression    Hepatitis C    High cholesterol    Hypertension    Seizures (HCC)    UTI (urinary tract infection)     Past Surgical History:  Procedure Laterality Date   APPENDECTOMY     BREAST BIOPSY Right 03/04/2022   stereo bx, mass X clip-path pending   TOE AMPUTATION      Family History  Problem Relation Age of Onset   Mental illness Mother        born in mental hospital   Breast cancer Neg Hx     Social History   Socioeconomic History   Marital status: Legally Separated    Spouse name: Not on file   Number of children: Not on file   Years of education: Not on file   Highest education level: Not on file  Occupational History   Not on file  Tobacco Use   Smoking status: Former    Current packs/day: 0.00    Average packs/day: 1 pack/day for 30.0 years (30.0 ttl pk-yrs)  Types: Cigarettes    Start date: 8    Quit date: 2023    Years since quitting: 2.5    Passive exposure: Past   Smokeless tobacco: Never  Vaping Use   Vaping status: Never Used  Substance and Sexual Activity   Alcohol use: Never   Drug use: Never   Sexual activity: Not Currently    Partners: Male  Other Topics Concern   Not on file  Social History Narrative   Not on file   Social Drivers of Health   Financial Resource Strain: Low Risk  (08/26/2023)   Overall Financial Resource Strain (CARDIA)    Difficulty of Paying Living Expenses: Not hard at all  Food Insecurity: No Food Insecurity (02/02/2024)   Hunger Vital Sign    Worried About Running Out of Food in the Last Year: Never true    Ran Out of Food in the Last Year: Never true  Transportation Needs: No Transportation Needs (02/02/2024)    PRAPARE - Administrator, Civil Service (Medical): No    Lack of Transportation (Non-Medical): No  Physical Activity: Inactive (08/26/2023)   Exercise Vital Sign    Days of Exercise per Week: 0 days    Minutes of Exercise per Session: 0 min  Stress: No Stress Concern Present (08/26/2023)   [employer] of Occupational Health - Occupational Stress Questionnaire    Feeling of Stress : Not at all  Social Connections: Socially Isolated (01/30/2024)   Social Connection and Isolation Panel    Frequency of Communication with Friends and Family: More than three times a week    Frequency of Social Gatherings with Friends and Family: Once a week    Attends Religious Services: Never    Database administrator or Organizations: No    Attends Banker Meetings: Never    Marital Status: Widowed  Intimate Partner Violence: Unknown (02/01/2024)   Humiliation, Afraid, Rape, and Kick questionnaire    Fear of Current or Ex-Partner: Patient unable to answer    Emotionally Abused: Not on file    Physically Abused: Not on file    Sexually Abused: Not on file   Review of Systems Finally sleeping some better now. Psychiatrist had told them to increase mirtazapine  to 60mg  --but daughter not comfortable with this so still on mirtazapine  Appetite is better now Back to walking again--some Parkinsonism still Wants to smoke--but daughter limits her to 2 cigarettes a week No cough Stable DOE--like going up steps     Objective:   Physical Exam Constitutional:      Appearance: Normal appearance.  Cardiovascular:     Rate and Rhythm: Normal rate and regular rhythm.     Heart sounds: No murmur heard.    No gallop.  Pulmonary:     Effort: Pulmonary effort is normal.     Breath sounds: Normal breath sounds. No wheezing or rales.  Musculoskeletal:     Cervical back: Neck supple.     Right lower leg: No edema.     Left lower leg: No edema.  Lymphadenopathy:     Cervical: No cervical  adenopathy.  Neurological:     Mental Status: She is alert.  Psychiatric:     Comments: Mild irritability No depression            Assessment & Plan:

## 2024-02-11 NOTE — Assessment & Plan Note (Addendum)
 Now seeing psychiatrist Depakote  really helping but got manic after travel/sleep issues Mirtazapine  30mg  at bedtime

## 2024-02-11 NOTE — Assessment & Plan Note (Signed)
 Likely due to lack of sleep, etc On higher depakote --will check level Has diazepam  for emergencies

## 2024-02-14 ENCOUNTER — Telehealth

## 2024-02-15 DIAGNOSIS — F17210 Nicotine dependence, cigarettes, uncomplicated: Secondary | ICD-10-CM | POA: Diagnosis not present

## 2024-02-15 NOTE — Progress Notes (Signed)
 err

## 2024-02-16 DIAGNOSIS — Z9181 History of falling: Secondary | ICD-10-CM | POA: Diagnosis not present

## 2024-02-16 DIAGNOSIS — Z89429 Acquired absence of other toe(s), unspecified side: Secondary | ICD-10-CM | POA: Diagnosis not present

## 2024-02-16 DIAGNOSIS — N1832 Chronic kidney disease, stage 3b: Secondary | ICD-10-CM | POA: Diagnosis not present

## 2024-02-16 DIAGNOSIS — E7800 Pure hypercholesterolemia, unspecified: Secondary | ICD-10-CM | POA: Diagnosis not present

## 2024-02-16 DIAGNOSIS — Z87440 Personal history of urinary (tract) infections: Secondary | ICD-10-CM | POA: Diagnosis not present

## 2024-02-16 DIAGNOSIS — Z87891 Personal history of nicotine dependence: Secondary | ICD-10-CM | POA: Diagnosis not present

## 2024-02-16 DIAGNOSIS — Z7951 Long term (current) use of inhaled steroids: Secondary | ICD-10-CM | POA: Diagnosis not present

## 2024-02-16 DIAGNOSIS — I129 Hypertensive chronic kidney disease with stage 1 through stage 4 chronic kidney disease, or unspecified chronic kidney disease: Secondary | ICD-10-CM | POA: Diagnosis not present

## 2024-02-17 ENCOUNTER — Telehealth: Payer: Self-pay | Admitting: Internal Medicine

## 2024-02-17 ENCOUNTER — Other Ambulatory Visit: Payer: Self-pay

## 2024-02-17 DIAGNOSIS — E7800 Pure hypercholesterolemia, unspecified: Secondary | ICD-10-CM | POA: Diagnosis not present

## 2024-02-17 DIAGNOSIS — Z9181 History of falling: Secondary | ICD-10-CM | POA: Diagnosis not present

## 2024-02-17 DIAGNOSIS — Z7951 Long term (current) use of inhaled steroids: Secondary | ICD-10-CM | POA: Diagnosis not present

## 2024-02-17 DIAGNOSIS — Z89429 Acquired absence of other toe(s), unspecified side: Secondary | ICD-10-CM | POA: Diagnosis not present

## 2024-02-17 DIAGNOSIS — Z87891 Personal history of nicotine dependence: Secondary | ICD-10-CM | POA: Diagnosis not present

## 2024-02-17 DIAGNOSIS — Z139 Encounter for screening, unspecified: Secondary | ICD-10-CM

## 2024-02-17 DIAGNOSIS — I129 Hypertensive chronic kidney disease with stage 1 through stage 4 chronic kidney disease, or unspecified chronic kidney disease: Secondary | ICD-10-CM | POA: Diagnosis not present

## 2024-02-17 DIAGNOSIS — N1832 Chronic kidney disease, stage 3b: Secondary | ICD-10-CM | POA: Diagnosis not present

## 2024-02-17 DIAGNOSIS — Z87440 Personal history of urinary (tract) infections: Secondary | ICD-10-CM | POA: Diagnosis not present

## 2024-02-17 NOTE — Telephone Encounter (Signed)
 Copied from CRM [id number]. Topic: General - Other >> Feb 17, 2024  2:07 PM Donee H wrote: Reason for CRM: [facility] RN called to state she is questioning patient's blood pressure. Reading was 158 today. She also states PT seen patient as well and the reading was 160 then. Also states per patient reporting urine has an odor when she uses restroom. It is clear per patient but has odor. No positive test for an UTI.  Follow up number [phone]

## 2024-02-17 NOTE — Telephone Encounter (Unsigned)
 Copied from CRM [id number]. Topic: General - Other >> Feb 17, 2024  2:07 PM Donee H wrote: Reason for CRM: [facility] RN called to state she is questioning patient's blood pressure. Reading was 158 today. She also states PT seen patient as well and the reading was 160 then. Also states per patient reporting urine has an odor when she uses restroom. It is clear per patient but has odor. No positive test for an UTI.  Follow up number [phone]

## 2024-02-17 NOTE — Patient Instructions (Signed)
 Visit Information  Thank you for taking time to visit with me today. Please don't hesitate to contact me if I can be of assistance to you before our next scheduled telephone appointment.  Our next appointment is by telephone on 02/25/24 at 1 pm  Following is a copy of your care plan:   Goals Addressed             This Visit's Progress    VBCI Transitions of Care (TOC) Care Plan       Problems:  Recent Hospitalization for treatment of seizures Knowledge Deficit Related to seizure and bipolar/ manic state management daughter states patient has aide worker 7 days per week 3 hour days.  Goal:  Over the next 30 days, the patient will not experience hospital readmission  Interventions:  Transitions of Care: Assessed for ongoing seizure activity Reviewed medications and discussed compliance-  Advised daughter to call psychiatry office and clarify mirtazapine  dosage.  Advised to call patients insurance and request blood pressure monitor. Once received advised to monitor blood pressure daily notifying primary care provider of blood pressure readings outside of established parameters.  Advised daughter to contact primary provider office and arrange new primary care provider for patient due to current provider retiring.  Assessed for start of care date for home health- daughter states home health services have started.  She states home health nurse made a visit and patient is scheduled for home health PT today.  Advised to keep follow up appointments with provider Assessed for patients current mental health states Reminded daughter of patients upcoming appointment with social worker on 02/25/24. Referred to community resource guide for meal delivery information   Patient Self Care Activities:  Attend all scheduled provider appointments Call pharmacy for medication refills 3-7 days in advance of running out of medications Call provider office for new concerns or questions  Take medications as  prescribed   Call patient insurance carrier for assistance with obtaining blood pressure monitor. Once blood pressure monitor received check blood pressures daily and record.  Call crisis line and/ or [place] behavior urgent care center for ongoing symptoms. [phone] or [phone]. Call psychiatry office and clarify mirtazapine  dosage.    Plan:  Telephone follow up appointment with care management team member scheduled for:  02/25/24 at 1 pm The patient has been provided with contact information for the care management team and has been advised to call with any health related questions or concerns.         The patient verbalized understanding of instructions, educational materials, and care plan provided today and DECLINED offer to receive copy of patient instructions, educational materials, and care plan.   The patient has been provided with contact information for the care management team and has been advised to call with any health related questions or concerns.   Please call the care guide team at [phone] if you need to cancel or reschedule your appointment.   Please call the Suicide and Crisis Lifeline: 988 call 1-800-273-TALK (toll free, 24 hour hotline) if you are experiencing a Mental Health or Behavioral Health Crisis or need someone to talk to.  Arvin Seip RN, BSN, CCM [employer], Population Health Case Manager Phone: [phone]

## 2024-02-17 NOTE — Telephone Encounter (Unsigned)
 Copied from CRM [id number]. Topic: General - Other >> Feb 17, 2024  3:04 PM Donee H wrote: Reason for CRM: [facility] RN called to state she is questioning patient's blood pressure. Reading was 158 today. She also states PT seen patient as well and the reading was 160 then. Also states per patient reporting urine has an odor when she uses restroom. It is clear per patient but has odor. No positive test for an UTI.  Follow up number [phone]

## 2024-02-18 ENCOUNTER — Telehealth: Payer: Self-pay

## 2024-02-18 NOTE — Telephone Encounter (Signed)
 Left detailed message on verified VM doe [place], [place]

## 2024-02-18 NOTE — Progress Notes (Signed)
   Telephone encounter was:  Unsuccessful.  02/18/2024 Name: Shelby Jordan MRN: [id number] DOB: April 10, 1951  Unsuccessful outbound call made today to assist with:  Food Insecurity  Outreach Attempt:  1st Attempt  No answer and unable to leave a message    Jon Colt [facility]  [facility] Guide, Phone: [phone] Fax: [phone] Website: [email or web address]

## 2024-02-21 ENCOUNTER — Telehealth: Payer: Self-pay

## 2024-02-21 NOTE — Progress Notes (Signed)
   Telephone encounter was:  Successful.  Complex Care Management Note Care Guide Note  02/21/2024 Name: Shelby Jordan MRN: [id number] DOB: 03/23/1951  [name] is a 73 y.o. year old female who is a primary care patient of Jimmy Charlie FERNS, MD . The community resource team was consulted for assistance with Food Insecurity  SDOH screenings and interventions completed:  Yes        Care guide performed the following interventions: Patient provided with information about care guide support team and interviewed to confirm resource needs.Pts daughter Darral pt has needs [facility] resources for food, Pts daughter will call [employer] about moms meals as she was discharged from the hospital 7/15   Follow Up Plan:  No further follow up planned at this time. The patient has been provided with needed resources.  Encounter Outcome:  Patient Visit Completed    Jon Colt [facility]  [facility] Guide, Phone: [phone] Fax: [phone] Website: [email or web address]

## 2024-02-22 DIAGNOSIS — Z89429 Acquired absence of other toe(s), unspecified side: Secondary | ICD-10-CM | POA: Diagnosis not present

## 2024-02-22 DIAGNOSIS — Z6832 Body mass index (BMI) 32.0-32.9, adult: Secondary | ICD-10-CM

## 2024-02-22 DIAGNOSIS — N1832 Chronic kidney disease, stage 3b: Secondary | ICD-10-CM | POA: Diagnosis not present

## 2024-02-22 DIAGNOSIS — F319 Bipolar disorder, unspecified: Secondary | ICD-10-CM | POA: Diagnosis not present

## 2024-02-22 DIAGNOSIS — Z87891 Personal history of nicotine dependence: Secondary | ICD-10-CM | POA: Diagnosis not present

## 2024-02-22 DIAGNOSIS — G40909 Epilepsy, unspecified, not intractable, without status epilepticus: Secondary | ICD-10-CM | POA: Diagnosis not present

## 2024-02-22 DIAGNOSIS — E66811 Obesity, class 1: Secondary | ICD-10-CM

## 2024-02-22 DIAGNOSIS — Z87440 Personal history of urinary (tract) infections: Secondary | ICD-10-CM | POA: Diagnosis not present

## 2024-02-22 DIAGNOSIS — Z7951 Long term (current) use of inhaled steroids: Secondary | ICD-10-CM | POA: Diagnosis not present

## 2024-02-22 DIAGNOSIS — I129 Hypertensive chronic kidney disease with stage 1 through stage 4 chronic kidney disease, or unspecified chronic kidney disease: Secondary | ICD-10-CM | POA: Diagnosis not present

## 2024-02-22 DIAGNOSIS — E7800 Pure hypercholesterolemia, unspecified: Secondary | ICD-10-CM | POA: Diagnosis not present

## 2024-02-22 DIAGNOSIS — B1920 Unspecified viral hepatitis C without hepatic coma: Secondary | ICD-10-CM

## 2024-02-25 ENCOUNTER — Telehealth: Payer: Self-pay | Admitting: *Deleted

## 2024-02-25 ENCOUNTER — Other Ambulatory Visit: Payer: Self-pay

## 2024-02-25 DIAGNOSIS — N1832 Chronic kidney disease, stage 3b: Secondary | ICD-10-CM | POA: Diagnosis not present

## 2024-02-25 DIAGNOSIS — I129 Hypertensive chronic kidney disease with stage 1 through stage 4 chronic kidney disease, or unspecified chronic kidney disease: Secondary | ICD-10-CM | POA: Diagnosis not present

## 2024-02-25 DIAGNOSIS — Z9181 History of falling: Secondary | ICD-10-CM | POA: Diagnosis not present

## 2024-02-25 DIAGNOSIS — Z7951 Long term (current) use of inhaled steroids: Secondary | ICD-10-CM | POA: Diagnosis not present

## 2024-02-25 DIAGNOSIS — Z87440 Personal history of urinary (tract) infections: Secondary | ICD-10-CM | POA: Diagnosis not present

## 2024-02-25 DIAGNOSIS — Z89429 Acquired absence of other toe(s), unspecified side: Secondary | ICD-10-CM | POA: Diagnosis not present

## 2024-02-25 DIAGNOSIS — Z87891 Personal history of nicotine dependence: Secondary | ICD-10-CM | POA: Diagnosis not present

## 2024-02-25 DIAGNOSIS — E7800 Pure hypercholesterolemia, unspecified: Secondary | ICD-10-CM | POA: Diagnosis not present

## 2024-02-25 NOTE — Patient Instructions (Signed)
 Visit Information  Thank you for taking time to visit with me today. Please don't hesitate to contact me if I can be of assistance to you before our next scheduled telephone appointment.  Our next appointment is by telephone on 03/03/24 at 2 pm  Following is a copy of your care plan:   Goals Addressed             This Visit's Progress    VBCI Transitions of Care (TOC) Care Plan       Problems:  Recent Hospitalization for treatment of seizures Knowledge Deficit Related to seizure and bipolar/ manic state management daughter states patient has aide worker 7 days per week 3 hour days.  Goal:  Over the next 30 days, the patient will not experience hospital readmission  Interventions:  Transitions of Care: Assessed for ongoing seizure activity Reviewed medications and discussed compliance-  Advised to call patients insurance and request blood pressure monitor. Once received advised to monitor blood pressure daily notifying primary care provider of blood pressure readings outside of established parameters.  Advised to call [facility] as instructed by community resource guide to request MOM meals post hospital discharge.  Advised daughter to contact primary provider office and arrange new primary care provider for patient due to current provider retiring.  Assessed for ongoing home health PT Advised to keep follow up appointments with provider Assessed for patients current mental health states    Patient Self Care Activities:  Attend all scheduled provider appointments Call pharmacy for medication refills 3-7 days in advance of running out of medications Call provider office for new concerns or questions  Take medications as prescribed   Call patient insurance carrier for MOMS meals as advised by [employer] guide.  Call crisis line and/ or [place] behavior urgent care center for ongoing symptoms. 988 or [phone].   Plan:  Telephone follow up appointment with care  management team member scheduled for:  03/03/24 at 2 pm        The patient verbalized understanding of instructions, educational materials, and care plan provided today and DECLINED offer to receive copy of patient instructions, educational materials, and care plan.   The patient has been provided with contact information for the care management team and has been advised to call with any health related questions or concerns.   Please call the care guide team at [phone] if you need to cancel or reschedule your appointment.   Please call the Suicide and Crisis Lifeline: 988 call 1-800-273-TALK (toll free, 24 hour hotline) if you are experiencing a Mental Health or Behavioral Health Crisis or need someone to talk to.  Arvin Seip RN, BSN, CCM [employer], Population Health Case Manager Phone: [phone]

## 2024-02-25 NOTE — Transitions of Care (Post Inpatient/ED Visit) (Signed)
 Transition of Care week 4  Visit Note  02/25/2024  Name: Shelby Jordan MRN: [id number]          DOB: Sep 29, 1950  Situation: Patient enrolled in [facility] 30-day program. Visit completed with patient by telephone.   Background:   Initial Transition Care Management Follow-up Telephone Call    Past Medical History:  Diagnosis Date   Bipolar 1 disorder (HCC)    Depression    Hepatitis C    High cholesterol    Hypertension    Seizures (HCC)    UTI (urinary tract infection)     Assessment: Patient Reported Symptoms: Cognitive Cognitive Status: Able to follow simple commands, Alert and oriented to person, place, and time      Neurological Neurological Review of Symptoms: No symptoms reported    HEENT HEENT Symptoms Reported: No symptoms reported      Cardiovascular Cardiovascular Symptoms Reported: No symptoms reported    Respiratory Respiratory Symptoms Reported: No symptoms reported    Endocrine Endocrine Symptoms Reported: No symptoms reported    Gastrointestinal Gastrointestinal Symptoms Reported: No symptoms reported      Genitourinary Genitourinary Symptoms Reported: No symptoms reported    Integumentary Integumentary Symptoms Reported: No symptoms reported    Musculoskeletal Musculoskelatal Symptoms Reviewed: No symptoms reported Additional Musculoskeletal Details: no new symptoms reported by daughter/ caregiver Shelby Jordan        Psychosocial Psychosocial Symptoms Reported: Alteration in sleep habits Other Psychosocial Conditions: Daughter states patient still continues not to sleep really well. Daughter states patient is aware.  She states patient is calmer.         There were no vitals filed for this visit.  Medications Reviewed Today     Reviewed by Woodford Strege E, RN (Registered Nurse) on 02/25/24 at 1325  Med List Status: <None>   Medication Order Taking? Sig Documenting Provider Last Dose Status Informant  albuterol  (VENTOLIN  HFA) 108 (90  Base) MCG/ACT inhaler [id number] Yes Inhale 2 puffs into the lungs every 4 (four) hours as needed for shortness of breath. Raenelle Donalda HERO, MD  Active Child, Pharmacy Records           Med Note LEOBARDO, NAT Repress Jan 30, 2024  3:46 AM) Kurt about refills or if this is a rescue inhaler.   amLODipine  (NORVASC ) 5 MG tablet [id number] Yes TAKE 1 TABLET (5 MG TOTAL) BY MOUTH DAILY. Corwin Antu, FNP  Active Child, Pharmacy Records  ammonium lactate  (AMLACTIN DAILY) 12 % lotion [id number] Yes Apply 1 Application topically as needed. Tobie Franky SQUIBB, DPM  Active Child, Pharmacy Records  diazePAM , 20 MG Dose, 2 x 10 MG/0.1ML LQPK [id number] Yes Place 20 mg into the nose as needed (seizure lasting more than 2 mins). Yadav, Priyanka O, MD  Active   divalproex  (DEPAKOTE ) 500 MG DR tablet [id number] Yes Take 1 tablet (500 mg total) by mouth every 12 (twelve) hours. Elgergawy, Brayton RAMAN, MD  Active   donepezil (ARICEPT) 5 MG tablet [id number] Yes Take 5 mg by mouth at bedtime. [provider]  Active Child, Pharmacy Records  fluticasone -salmeterol (WIXELA INHUB ) 100-50 MCG/ACT AEPB [id number] Yes Inhale 1 puff into the lungs 2 (two) times daily. Raenelle Donalda HERO, MD  Active Child, Pharmacy Records  metoprolol  succinate (TOPROL -XL) 50 MG 24 hr tablet [id number] Yes TAKE 1 TABLET BY MOUTH EVERY DAY Dugal, Tabitha, FNP  Active Child, Pharmacy Records  mirtazapine  (REMERON ) 30 MG tablet [id number] Yes Take 30 mg by mouth at bedtime. Daughter states patient  psychiatrist advised patient to take 15 mg per day.  Daughter states she will call psychiatry office to clarify dosage. [provider]  Active   Multiple Vitamins-Minerals (MULTIVITAMIN WOMEN 50+) TABS [id number] Yes Take 1 tablet by mouth daily. Centrum Silver [provider]  Active Child, Pharmacy Records            Goals Addressed             This Visit's Progress    VBCI Transitions of Care (TOC) Care Plan       Problems:   Recent Hospitalization for treatment of seizures Knowledge Deficit Related to seizure and bipolar/ manic state management daughter states patient has aide worker 7 days per week 3 hour days.  Goal:  Over the next 30 days, the patient will not experience hospital readmission  Interventions:  Transitions of Care: Assessed for ongoing seizure activity Reviewed medications and discussed compliance-  Advised to call patients insurance and request blood pressure monitor. Once received advised to monitor blood pressure daily notifying primary care provider of blood pressure readings outside of established parameters.  Advised to call [facility] as instructed by community resource guide to request MOM meals post hospital discharge.  Advised daughter to contact primary provider office and arrange new primary care provider for patient due to current provider retiring.  Assessed for ongoing home health PT Advised to keep follow up appointments with provider Assessed for patients current mental health states    Patient Self Care Activities:  Attend all scheduled provider appointments Call pharmacy for medication refills 3-7 days in advance of running out of medications Call provider office for new concerns or questions  Take medications as prescribed   Call patient insurance carrier for MOMS meals as advised by [employer] guide.  Call crisis line and/ or [place] [facility] urgent care center for ongoing symptoms. [phone] or [phone].   Plan:  Telephone follow up appointment with care management team member scheduled for:  03/03/24 at 2 pm         Recommendation:   Continue Current Plan of Care  Follow Up Plan:   Telephone follow-up in 1 week8/15/25 at 2 pm  Arvin Seip RN, BSN, CCM [facility], Population Health Case Manager Phone: [phone]

## 2024-03-01 DIAGNOSIS — Z87891 Personal history of nicotine dependence: Secondary | ICD-10-CM | POA: Diagnosis not present

## 2024-03-01 DIAGNOSIS — I129 Hypertensive chronic kidney disease with stage 1 through stage 4 chronic kidney disease, or unspecified chronic kidney disease: Secondary | ICD-10-CM | POA: Diagnosis not present

## 2024-03-01 DIAGNOSIS — Z9181 History of falling: Secondary | ICD-10-CM | POA: Diagnosis not present

## 2024-03-01 DIAGNOSIS — N1832 Chronic kidney disease, stage 3b: Secondary | ICD-10-CM | POA: Diagnosis not present

## 2024-03-01 DIAGNOSIS — Z89429 Acquired absence of other toe(s), unspecified side: Secondary | ICD-10-CM | POA: Diagnosis not present

## 2024-03-01 DIAGNOSIS — Z7951 Long term (current) use of inhaled steroids: Secondary | ICD-10-CM | POA: Diagnosis not present

## 2024-03-01 DIAGNOSIS — Z87440 Personal history of urinary (tract) infections: Secondary | ICD-10-CM | POA: Diagnosis not present

## 2024-03-03 ENCOUNTER — Other Ambulatory Visit: Payer: Self-pay

## 2024-03-03 DIAGNOSIS — N1832 Chronic kidney disease, stage 3b: Secondary | ICD-10-CM | POA: Diagnosis not present

## 2024-03-03 DIAGNOSIS — G40909 Epilepsy, unspecified, not intractable, without status epilepticus: Secondary | ICD-10-CM

## 2024-03-03 DIAGNOSIS — F3110 Bipolar disorder, current episode manic without psychotic features, unspecified: Secondary | ICD-10-CM

## 2024-03-03 NOTE — Transitions of Care (Post Inpatient/ED Visit) (Signed)
 Transition of Care week #5  Visit Note  03/03/2024  Name: Ramiah Helfrich MRN: [id number]          DOB: 04-Nov-1950  Situation: Patient enrolled in [facility] 30-day program. Visit completed with patient by telephone.   Background:    Past Medical History:  Diagnosis Date   Bipolar 1 disorder (HCC)    Depression    Hepatitis C    High cholesterol    Hypertension    Seizures (HCC)    UTI (urinary tract infection)     Assessment: Patient Reported Symptoms: Cognitive Cognitive Status: Alert and oriented to person, place, and time, Able to follow simple commands, Struggling with memory recall      Neurological Neurological Review of Symptoms: No symptoms reported    HEENT HEENT Symptoms Reported: No symptoms reported      Cardiovascular Cardiovascular Symptoms Reported: No symptoms reported    Respiratory Respiratory Symptoms Reported: No symptoms reported    Endocrine Endocrine Symptoms Reported: No symptoms reported    Gastrointestinal Gastrointestinal Symptoms Reported: No symptoms reported      Genitourinary Genitourinary Symptoms Reported: No symptoms reported    Integumentary Integumentary Symptoms Reported: No symptoms reported    Musculoskeletal Musculoskelatal Symptoms Reviewed: No symptoms reported        Psychosocial Psychosocial Symptoms Reported: Alteration in sleep habits Other Psychosocial Conditions: Daughter states patient continues to not sleep very good.  She states she placed a call to [facility] behavioral health to further discuss patients symptoms. She states she is waiting to hear back.         There were no vitals filed for this visit.  Medications Reviewed Today     Reviewed by Jemal Miskell E, RN (Registered Nurse) on 03/03/24 at 1214  Med List Status: <None>   Medication Order Taking? Sig Documenting Provider Last Dose Status Informant  albuterol  (VENTOLIN  HFA) 108 (90 Base) MCG/ACT inhaler [id number] Yes Inhale 2 puffs into the lungs every 4  (four) hours as needed for shortness of breath. Raenelle Donalda HERO, MD  Active Child, Pharmacy Records           Med Note LEOBARDO, NAT Repress Jan 30, 2024  3:46 AM) Kurt about refills or if this is a rescue inhaler.   amLODipine  (NORVASC ) 5 MG tablet [id number] Yes TAKE 1 TABLET (5 MG TOTAL) BY MOUTH DAILY. Corwin Antu, FNP  Active Child, Pharmacy Records  ammonium lactate  (AMLACTIN DAILY) 12 % lotion [id number] Yes Apply 1 Application topically as needed. Tobie Franky SQUIBB, DPM  Active Child, Pharmacy Records  diazePAM , 20 MG Dose, 2 x 10 MG/0.1ML LQPK [id number] Yes Place 20 mg into the nose as needed (seizure lasting more than 2 mins). Yadav, Priyanka O, MD  Active   divalproex  (DEPAKOTE ) 500 MG DR tablet [id number] Yes Take 1 tablet (500 mg total) by mouth every 12 (twelve) hours. Elgergawy, Brayton RAMAN, MD  Active   donepezil (ARICEPT) 5 MG tablet [id number] Yes Take 5 mg by mouth at bedtime. [provider]  Active Child, Pharmacy Records  fluticasone -salmeterol (WIXELA INHUB ) 100-50 MCG/ACT AEPB [id number] Yes Inhale 1 puff into the lungs 2 (two) times daily. Raenelle Donalda HERO, MD  Active Child, Pharmacy Records  metoprolol  succinate (TOPROL -XL) 50 MG 24 hr tablet [id number] Yes TAKE 1 TABLET BY MOUTH EVERY DAY Dugal, Tabitha, FNP  Active Child, Pharmacy Records  mirtazapine  (REMERON ) 30 MG tablet [id number] Yes Take 30 mg by mouth at bedtime. Daughter states patient psychiatrist advised patient to take  15 mg per day.  Daughter states she will call psychiatry office to clarify dosage. [provider]  Active   Multiple Vitamins-Minerals (MULTIVITAMIN WOMEN 50+) TABS [id number] Yes Take 1 tablet by mouth daily. Centrum Silver [provider]  Active Child, Pharmacy Records            Recommendation:   Daughter / caregiver agreed to follow up by complex care manager.   Follow Up Plan:   Patient has me her [facility] program goals and will be transferred to CCM.  Referral has  been placed to the CCM team.   Arvin Seip RN, BSN, CCM [place]  [facility], Population Health Case Manager Phone: [phone]

## 2024-03-03 NOTE — Patient Instructions (Signed)
 Visit Information  Thank you for taking time to visit with me today. Please don't hesitate to contact me if I can be of assistance to you.  A referral has been placed for ongoing follow up with complex case manager as discussed.   Following is a copy of your care plan:   Goals Addressed             This Visit's Progress    VBCI Transitions of Care (TOC) Care Plan       TOC Goals met.  Patient to be transferred to Complex case management for ongoing follow up  Problems:  Recent Hospitalization for treatment of seizures Knowledge Deficit Related to seizure and bipolar/ manic state management daughter states patient has aide worker 7 days per week 3 hour days.  Goal:  Over the next 30 days, the patient will not experience hospital readmission  Interventions:  Transitions of Care: Assessed for ongoing seizure activity Reviewed medications and discussed compliance- Advised daughter to contact primary provider office and arrange new primary care provider for patient due to current provider retiring.  Assessed for ongoing home health PT Discussed upcoming provider appointments Advised to keep follow up appointments with provider Assessed for patients current mental health status Advised to call crisis line and/ or [place] behavioral urgent care center for increase in symptoms.  Discussed and offered ongoing follow up with complex case manager. - Daughter verbally agreed.   Patient Self Care Activities:  Attend all scheduled provider appointments Call pharmacy for medication refills 3-7 days in advance of running out of medications Call provider office for new concerns or questions  Take medications as prescribed   Call crisis line and/ or [place] behavior urgent care center for ongoing symptoms. [phone] or [phone].   Plan:  No further follow up needed by transition of care RN case manager        The patient verbalized understanding of instructions, educational  materials, and care plan provided today and DECLINED offer to receive copy of patient instructions, educational materials, and care plan.   The patient has been provided with contact information for the care management team and has been advised to call with any health related questions or concerns.   Please call the care guide team at [phone] if you need to cancel or reschedule your appointment.   Please call the Suicide and Crisis Lifeline: 988 call 1-800-273-TALK (toll free, 24 hour hotline) if you are experiencing a Mental Health or Behavioral Health Crisis or need someone to talk to.  Arvin Seip RN, BSN, CCM [employer], Population Health Case Manager Phone: [phone]

## 2024-03-04 ENCOUNTER — Other Ambulatory Visit: Payer: Self-pay | Admitting: Family

## 2024-03-04 DIAGNOSIS — I10 Essential (primary) hypertension: Secondary | ICD-10-CM

## 2024-03-07 DIAGNOSIS — Z87891 Personal history of nicotine dependence: Secondary | ICD-10-CM | POA: Diagnosis not present

## 2024-03-07 DIAGNOSIS — E7800 Pure hypercholesterolemia, unspecified: Secondary | ICD-10-CM | POA: Diagnosis not present

## 2024-03-07 DIAGNOSIS — I129 Hypertensive chronic kidney disease with stage 1 through stage 4 chronic kidney disease, or unspecified chronic kidney disease: Secondary | ICD-10-CM | POA: Diagnosis not present

## 2024-03-07 DIAGNOSIS — Z87440 Personal history of urinary (tract) infections: Secondary | ICD-10-CM | POA: Diagnosis not present

## 2024-03-07 DIAGNOSIS — N1832 Chronic kidney disease, stage 3b: Secondary | ICD-10-CM | POA: Diagnosis not present

## 2024-03-07 DIAGNOSIS — Z7951 Long term (current) use of inhaled steroids: Secondary | ICD-10-CM | POA: Diagnosis not present

## 2024-03-07 DIAGNOSIS — Z9181 History of falling: Secondary | ICD-10-CM | POA: Diagnosis not present

## 2024-03-10 ENCOUNTER — Ambulatory Visit: Payer: Self-pay

## 2024-03-10 DIAGNOSIS — Z87440 Personal history of urinary (tract) infections: Secondary | ICD-10-CM | POA: Diagnosis not present

## 2024-03-10 DIAGNOSIS — Z7951 Long term (current) use of inhaled steroids: Secondary | ICD-10-CM | POA: Diagnosis not present

## 2024-03-10 DIAGNOSIS — Z87891 Personal history of nicotine dependence: Secondary | ICD-10-CM | POA: Diagnosis not present

## 2024-03-10 DIAGNOSIS — Z89429 Acquired absence of other toe(s), unspecified side: Secondary | ICD-10-CM | POA: Diagnosis not present

## 2024-03-10 DIAGNOSIS — N1832 Chronic kidney disease, stage 3b: Secondary | ICD-10-CM | POA: Diagnosis not present

## 2024-03-10 DIAGNOSIS — I129 Hypertensive chronic kidney disease with stage 1 through stage 4 chronic kidney disease, or unspecified chronic kidney disease: Secondary | ICD-10-CM | POA: Diagnosis not present

## 2024-03-10 DIAGNOSIS — E7800 Pure hypercholesterolemia, unspecified: Secondary | ICD-10-CM | POA: Diagnosis not present

## 2024-03-10 DIAGNOSIS — Z9181 History of falling: Secondary | ICD-10-CM | POA: Diagnosis not present

## 2024-03-10 NOTE — Telephone Encounter (Signed)
 Spoke to pt's daughter and advised her to follow the recommendation from the triage nurse because Dr Jimmy is out of the office this afternoon. Also called [place] and advised her I spoke to the daughter.

## 2024-03-10 NOTE — Telephone Encounter (Signed)
 FYI Only or Action Required?: Action required by provider: update on patient condition.  Patient was last seen in primary care on 02/11/2024 by Jimmy Charlie FERNS, MD.  Called Nurse Triage reporting Heart rate issue.  Symptoms began today.  Interventions attempted: Nothing.  Symptoms are: stable. Devon, home health nurse reports HR 50-55. Normally 60-80. No symptoms. BP 126/80. O2 sat 97%  Triage Disposition: See HCP Within 4 Hours (Or PCP Triage) Daughter will monitor pt. Will call 911 for any symptoms, dizziness, SOB, chest pain. Soke with CAL, Renee, in the practice.  Patient/caregiver understands and will follow disposition?: No, wishes to speak with PCP    Copied from CRM #[id number]. Topic: Clinical - Red Word Triage >> Mar 10, 2024  2:55 PM Donna BRAVO wrote: Red Word that prompted transfer to Nurse Triage: [facility] with [facility] health calling patient heart rate 50 to 52  regular 67 to 80 Reason for Disposition  Age > 60 years  (Exception: Brief heartbeat symptoms that went away and now feels well.)  Answer Assessment - Initial Assessment Questions 1. DESCRIPTION: Please describe your heart rate or heartbeat that you are having (e.g., fast/slow, regular/irregular, skipped or extra beats, palpitations)     HR slower than normal 2. ONSET: When did it start? (e.g., minutes, hours, days)      today 3. DURATION: How long does it last (e.g., seconds, minutes, hours)     now 4. PATTERN Does it come and go, or has it been constant since it started?  Does it get worse with exertion?   Are you feeling it now?     unsure 5. TAP: Using your hand, can you tap out what you are feeling on a chair or table in front of you, so that I can hear? Note: Not all patients can do this.       no 6. HEART RATE: Can you tell me your heart rate? How many beats in 15 seconds?  Note: Not all patients can do this.       50-52 7. RECURRENT SYMPTOM: Have you ever had this before? If Yes,  ask: When was the last time? and What happened that time?      unsure 8. CAUSE: What do you think is causing the palpitations?     unsure 9. CARDIAC HISTORY: Do you have any history of heart disease? (e.g., heart attack, angina, bypass surgery, angioplasty, arrhythmia)      no 10. OTHER SYMPTOMS: Do you have any other symptoms? (e.g., dizziness, chest pain, sweating, difficulty breathing)       no 11. PREGNANCY: Is there any chance you are pregnant? When was your last menstrual period?       no  Protocols used: Heart Rate and Heartbeat Questions-A-AH

## 2024-03-16 DIAGNOSIS — Z9181 History of falling: Secondary | ICD-10-CM | POA: Diagnosis not present

## 2024-03-23 DIAGNOSIS — N1832 Chronic kidney disease, stage 3b: Secondary | ICD-10-CM | POA: Diagnosis not present

## 2024-03-23 DIAGNOSIS — Z7951 Long term (current) use of inhaled steroids: Secondary | ICD-10-CM | POA: Diagnosis not present

## 2024-03-23 DIAGNOSIS — Z87891 Personal history of nicotine dependence: Secondary | ICD-10-CM | POA: Diagnosis not present

## 2024-03-23 DIAGNOSIS — Z87440 Personal history of urinary (tract) infections: Secondary | ICD-10-CM | POA: Diagnosis not present

## 2024-03-23 DIAGNOSIS — E7800 Pure hypercholesterolemia, unspecified: Secondary | ICD-10-CM | POA: Diagnosis not present

## 2024-03-23 DIAGNOSIS — Z9181 History of falling: Secondary | ICD-10-CM | POA: Diagnosis not present

## 2024-03-23 DIAGNOSIS — Z89429 Acquired absence of other toe(s), unspecified side: Secondary | ICD-10-CM | POA: Diagnosis not present

## 2024-03-23 DIAGNOSIS — I129 Hypertensive chronic kidney disease with stage 1 through stage 4 chronic kidney disease, or unspecified chronic kidney disease: Secondary | ICD-10-CM | POA: Diagnosis not present

## 2024-03-27 ENCOUNTER — Telehealth: Payer: Self-pay

## 2024-03-27 NOTE — Progress Notes (Signed)
 Complex Care Management Note  Care Guide Note 03/27/2024 Name: [name] MRN: [id number] DOB: [birth year]  Alle Difabio is a 73 y.o. year old female who sees Jimmy Charlie FERNS, MD for primary care. I reached out to Apolonia Behring by phone today to offer complex care management services.  Ms. Wheller was given information about Complex Care Management services today including:   The Complex Care Management services include support from the care team which includes your Nurse Care Manager, Clinical Social Worker, or Pharmacist.  The Complex Care Management team is here to help remove barriers to the health concerns and goals most important to you. Complex Care Management services are voluntary, and the patient may decline or stop services at any time by request to their care team member.   Complex Care Management Consent Status: Patient agreed to services and verbal consent obtained.   Follow up plan:  Telephone appointment with complex care management team member scheduled for:  04/06/24 at 3:00 p.m.  Encounter Outcome:  Patient Scheduled  Dreama [facility]  [facility], [facility] VBCI Assistant Direct Dial: [phone]  Fax: [phone]

## 2024-03-29 ENCOUNTER — Encounter: Payer: Self-pay | Admitting: Neurology

## 2024-03-29 ENCOUNTER — Ambulatory Visit (INDEPENDENT_AMBULATORY_CARE_PROVIDER_SITE_OTHER): Admitting: Neurology

## 2024-03-29 VITALS — BP 128/80 | HR 66 | Ht 64.0 in | Wt 199.0 lb

## 2024-03-29 DIAGNOSIS — F250 Schizoaffective disorder, bipolar type: Secondary | ICD-10-CM

## 2024-03-29 DIAGNOSIS — G40901 Epilepsy, unspecified, not intractable, with status epilepticus: Secondary | ICD-10-CM

## 2024-03-29 DIAGNOSIS — Z5181 Encounter for therapeutic drug level monitoring: Secondary | ICD-10-CM

## 2024-03-29 NOTE — Progress Notes (Signed)
 [facility]  PATIENT: Shelby Jordan DOB: 15-Oct-1950  REQUESTING CLINICIAN: Elgergawy, Brayton RAMAN, MD HISTORY FROM: Patient but mainly Daughter, and chart review  REASON FOR VISIT: Seizure    HISTORICAL  CHIEF COMPLAINT:  Chief Complaint  Patient presents with   New Patient (Initial Visit)    Rm 12, with daughter Marijane, NP for seizures   INTERVAL HISTORY 03/29/2024 Patient presents today for follow-up, last visit was in April, at that time we continued her on Depakote  500 mg twice daily.  Daughter tells me in July she did have a manic episode, taken to the hospital and found to be in status epilepticus.  Patient EEG showed multiple nonconvulsive seizures.  Per chart review patient Depakote  was increased to 500 mg twice daily, I wonder if she was taking 500 mg daily instead of twice daily as recommended.  Since  discharge from the hospital, daughter tells me that patient has been more depressed, they follow-up with psychiatrist who started mirtazapine .  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  Patient is not currently on an antipsychotic or mood stabilizer.    HISTORY OF PRESENT ILLNESS:  This is 73 year old woman past medical history of schizoaffective disorder, bipolar disorder, seizure disorder, anxiety, depression who is presenting after a seizure on January 18.  History is mainly obtained from daughter.  Daughter tells me on January 18, patient fell while coming home after visiting a neighbor, she hit her head but they did not think of patient having a seizure at that time. The next day, they found patient on the floor with unexplained fall, she was taken to the hospital and in the ED waiting room she did have a seizure.  Patient was taken in the back, observed, was back to baseline and discharged home, while at home, again patient was found in the closet on the floor unexplained fall. Family thought  that she might have another seizure and took her to [facility]. While in [facility] ED she was reported to have a generalized seizure.  After further history, it was noted that patient was out of the Depakote  for about a month and a half.  Upon further history, daughter reports patient did have a seizure 3 years ago in the setting of running out of Depakote .  Daughter tells me that he believes patient first seizure was around 17 years ago.  She has been on Depakote  for bipolar disorder all this time and the family did not think that she had a seizure disorder.  Since being discharged from the hospital, she is on Depakote  500 mg twice daily, she is back to her normal self and no additional seizure, her Depakote  level was checked and was within normal limits.  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  She is following with a psychiatrist and she is not currently on any antipsychotic but they feel that she is the same, she is not getting worse.    OTHER MEDICAL CONDITIONS: Hypertension, Hyperlipidemia, Bipolar, Schizoaffective disorder   REVIEW OF SYSTEMS: Full 14 system review of systems performed and negative with exception of: As noted in the HPI   ALLERGIES: Allergies  Allergen Reactions   Olanzapine  Other (See Comments)    Parkinson-like symptoms   Tomato (Diagnostic) Other (See Comments)    Tongue break out.    Citrus Rash  HOME MEDICATIONS: Outpatient Medications Prior to Visit  Medication Sig Dispense Refill   albuterol  (VENTOLIN  HFA) 108 (90 Base) MCG/ACT inhaler Inhale 2 puffs into the lungs every 4 (four) hours as needed for shortness of breath. 18 g 1   amLODipine  (NORVASC ) 5 MG tablet TAKE 1 TABLET (5 MG TOTAL) BY MOUTH DAILY. 90 tablet 3   ammonium lactate  (AMLACTIN DAILY) 12 % lotion Apply 1 Application topically as needed. 400 g 0   diazePAM , 20 MG Dose, 2 x 10  MG/0.1ML LQPK Place 20 mg into the nose as needed (seizure lasting more than 2 mins). 5 each 3   divalproex  (DEPAKOTE ) 500 MG DR tablet Take 1 tablet (500 mg total) by mouth every 12 (twelve) hours. 180 tablet 0   donepezil (ARICEPT) 5 MG tablet Take 5 mg by mouth at bedtime.     fluticasone -salmeterol (WIXELA INHUB ) 100-50 MCG/ACT AEPB Inhale 1 puff into the lungs 2 (two) times daily. 60 each 1   metoprolol  succinate (TOPROL -XL) 50 MG 24 hr tablet TAKE 1 TABLET BY MOUTH EVERY DAY 90 tablet 0   mirtazapine  (REMERON ) 30 MG tablet Take 30 mg by mouth at bedtime. Daughter states patient psychiatrist advised patient to take 15 mg per day.  Daughter states she will call psychiatry office to clarify dosage.     Multiple Vitamins-Minerals (MULTIVITAMIN WOMEN 50+) TABS Take 1 tablet by mouth daily. Centrum Silver     traZODone  (DESYREL ) 50 MG tablet Take 50 mg by mouth at bedtime.     No facility-administered medications prior to visit.    PAST MEDICAL HISTORY: Past Medical History:  Diagnosis Date   Bipolar 1 disorder (HCC)    Depression    Hepatitis C    High cholesterol    Hypertension    Seizures (HCC)    UTI (urinary tract infection)     PAST SURGICAL HISTORY: Past Surgical History:  Procedure Laterality Date   APPENDECTOMY     BREAST BIOPSY Right 03/04/2022   stereo bx, mass X clip-path pending   TOE AMPUTATION      FAMILY HISTORY: Family History  Problem Relation Age of Onset   Mental illness Mother        born in mental hospital   Breast cancer Neg Hx     SOCIAL HISTORY: Social History   Socioeconomic History   Marital status: Legally Separated    Spouse name: Not on file   Number of children: Not on file   Years of education: Not on file   Highest education level: Not on file  Occupational History   Not on file  Tobacco Use   Smoking status: Former    Current packs/day: 0.00    Average packs/day: 1 pack/day for 30.0 years (30.0 ttl pk-yrs)    Types:  Cigarettes    Start date: 12    Quit date: 2023    Years since quitting: 2.6    Passive exposure: Past   Smokeless tobacco: Never  Vaping Use   Vaping status: Never Used  Substance and Sexual Activity   Alcohol use: Never   Drug use: Never   Sexual activity: Not Currently    Partners: Male  Other Topics Concern   Not on file  Social History Narrative   Not on file   Social Drivers of Health   Financial Resource Strain: Low Risk  (08/26/2023)   Overall Financial Resource Strain (CARDIA)    Difficulty of Paying Living Expenses: Not hard at all  Food  Insecurity: Food Insecurity Present (02/21/2024)   Hunger Vital Sign    Worried About Running Out of Food in the Last Year: Often true    Ran Out of Food in the Last Year: Often true  Transportation Needs: No Transportation Needs (02/02/2024)   PRAPARE - Administrator, Civil Service (Medical): No    Lack of Transportation (Non-Medical): No  Physical Activity: Inactive (08/26/2023)   Exercise Vital Sign    Days of Exercise per Week: 0 days    Minutes of Exercise per Session: 0 min  Stress: No Stress Concern Present (08/26/2023)   [employer] of Occupational Health - Occupational Stress Questionnaire    Feeling of Stress : Not at all  Social Connections: Socially Isolated (01/30/2024)   Social Connection and Isolation Panel    Frequency of Communication with Friends and Family: More than three times a week    Frequency of Social Gatherings with Friends and Family: Once a week    Attends Religious Services: Never    Database administrator or Organizations: No    Attends Banker Meetings: Never    Marital Status: Widowed  Intimate Partner Violence: Unknown (02/01/2024)   Humiliation, Afraid, Rape, and Kick questionnaire    Fear of Current or Ex-Partner: Patient unable to answer    Emotionally Abused: Not on file    Physically Abused: Not on file    Sexually Abused: Not on file    PHYSICAL  EXAM  GENERAL EXAM/CONSTITUTIONAL: Vitals:  Vitals:   03/29/24 1101  BP: 128/80  Pulse: 66  Weight: 199 lb (90.3 kg)  Height: 5' 4 (1.626 m)   Body mass index is 34.16 kg/m. Wt Readings from Last 3 Encounters:  03/29/24 199 lb (90.3 kg)  02/11/24 200 lb (90.7 kg)  01/30/24 198 lb 10.2 oz (90.1 kg)   Patient is in no distress; well developed, nourished and groomed; neck is supple  MUSCULOSKELETAL: Gait, strength, tone, movements noted in Neurologic exam below  NEUROLOGIC: MENTAL STATUS:     03/02/2022    2:27 PM  MMSE - Mini Mental State Exam  Orientation to time 4   awake, alert, oriented to person, able to tell me today's date, tells me there are 6/4 in $1,75.  CRANIAL NERVE:  2nd, 3rd, 4th, 6th - Visual fields full to confrontation, extraocular muscles intact, no nystagmus 5th - facial sensation symmetric 7th - facial strength symmetric 8th - hearing intact 9th - palate elevates symmetrically, uvula midline 11th - shoulder shrug symmetric 12th - tongue protrusion midline  MOTOR:  normal bulk and tone, at least antigravity, able to stand.   SENSORY:  normal and symmetric to light touch  COORDINATION:  finger-nose-finger, fine finger movements normal  GAIT/STATION:  Deferred, using a wheelchair but able to stand.    DIAGNOSTIC DATA (LABS, IMAGING, TESTING) - I reviewed patient records, labs, notes, testing and imaging myself where available.  Lab Results  Component Value Date   WBC 9.0 01/30/2024   HGB 13.6 01/30/2024   HCT 41.4 01/30/2024   MCV 92.6 01/30/2024   PLT 236 01/30/2024      Component Value Date/Time   NA 138 01/30/2024 0408   K 4.3 01/30/2024 0408   CL 104 01/30/2024 0408   CO2 25 01/30/2024 0408   GLUCOSE 106 (H) 01/30/2024 0408   BUN 13 01/30/2024 0408   CREATININE 1.05 (H) 01/30/2024 0408   CALCIUM  9.7 01/30/2024 0408   PROT 7.4 01/29/2024 2015  ALBUMIN 4.1 01/29/2024 2015   AST 40 01/29/2024 2015   ALT 19 01/29/2024 2015    ALKPHOS 42 01/29/2024 2015   BILITOT 0.8 01/29/2024 2015   GFRNONAA 56 (L) 01/30/2024 0408   GFRAA 40 (L) 01/01/2020 1732   Lab Results  Component Value Date   CHOL 233 (H) 11/28/2021   HDL 71.50 11/28/2021   LDLCALC 135 (H) 11/28/2021   TRIG 133.0 11/28/2021   Lab Results  Component Value Date   HGBA1C 5.8 10/08/2023   Lab Results  Component Value Date   VITAMINB12 1,356 (H) 04/15/2023   Lab Results  Component Value Date   TSH 1.09 03/02/2022   Depakote  level 10/08/2023  90.1  MRI Brain 08/10/2023 No acute or reversible finding. 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 Moderate to pronounced chronic small-vessel ischemic changes of the deep and subcortical white matter of both hemispheres.  Overnight EEG 08/09/2023 This study is within normal limits. No seizures or epileptiform discharges were seen throughout the recording. A normal interictal EEG does not exclude the diagnosis of epilepsy   EEG 01/30/2024 - Status epilepticus, left frontal region - Seizure without clinical signs, left frontal region  - Continuous slow, generalized   IMPRESSION: At the beginning of the study, seizure was ongoing. EEG then showed three more seizure without clinical signs arising from left frontal region on 01/30/2024 at 0041, 0045, 0047, lasting about 1-2 minutes each.    At around 0058, EEG worsened and patient went to status epilepticus arising from left frontal region. Initially, no clinical signs were noted. At around 0129, patient was noted to have right gaze deviation followed by generalized stiffening. Seizure ended at 0131.    Additionally there is moderate diffuse encephalopathy  ASSESSMENT AND PLAN  73 y.o. year old female  with history of schizoaffective disorder, bipolar disorder, hypertension, hyperlipidemia and seizure disorder who is presenting for follow-up.  Patient was recently admitted in July for manic episode and  seizures.  At that time her Depakote  level was normal at 69.  She was found to have 3 nonconvulsive seizures on EEG.  At discharge her Depakote  500 mg twice daily was continued.  She did follow-up with psychiatry and started on Remeron .  Daughter will follow-up with psychiatry later today to discuss possibility of starting an antipsychotic or a mood stabilizer.  We will obtain a 3-day ambulatory EEG to rule out additional subclinical seizures.  If any seizures captured on EEG, will likely increase Depakote  to 1000 mg twice daily.  I will see them in 6 months for follow-up but they understand to contact me if patient has a breakthrough seizure or any other concerns.   1. Schizoaffective disorder, bipolar type (HCC)   2. Nonintractable epilepsy with status epilepticus, unspecified epilepsy type (HCC)   3. Therapeutic drug monitoring     Patient Instructions  Continue with Depakote  500 mg twice daily, refill given. Last Depakote  level 69 Will obtain a 3-day ambulatory EEG to rule out subclinical seizures, if any seizure, We will most likely increase the Depakote  Continue your other medications Follow-up with psychiatry regarding possibilities of starting another antipsychotic or mood stabilizer Follow up as schedule on November 11.   Per [place]  DMV statutes, patients with seizures are not allowed to drive until they have been seizure-free for six months.  Other recommendations include using caution when using heavy equipment or power tools. Avoid working on ladders or at heights. Take showers instead  of baths.  Do not swim alone.  Ensure the water  temperature is not too high on the home water  heater. Do not go swimming alone. Do not lock yourself in a room alone (i.e. bathroom). When caring for infants or small children, sit down when holding, feeding, or changing them to minimize risk of injury to the child in the event you have a seizure. Maintain good sleep hygiene. Avoid alcohol.  Also  recommend adequate sleep, hydration, good diet and minimize stress.   During the Seizure  - First, ensure adequate ventilation and place patients on the floor on their left side  Loosen clothing around the neck and ensure the airway is patent. If the patient is clenching the teeth, do not force the mouth open with any object as this can cause severe damage - Remove all items from the surrounding that can be hazardous. The patient may be oblivious to what's happening and may not even know what he or she is doing. If the patient is confused and wandering, either gently guide him/her away and block access to outside areas - Reassure the individual and be comforting - Call 911. In most cases, the seizure ends before EMS arrives. However, there are cases when seizures may last over 3 to 5 minutes. Or the individual may have developed breathing difficulties or severe injuries. If a pregnant patient or a person with diabetes develops a seizure, it is prudent to call an ambulance. - Finally, if the patient does not regain full consciousness, then call EMS. Most patients will remain confused for about 45 to 90 minutes after a seizure, so you must use judgment in calling for help. - Avoid restraints but make sure the patient is in a bed with padded side rails - Place the individual in a lateral position with the neck slightly flexed; this will help the saliva drain from the mouth and prevent the tongue from falling backward - Remove all nearby furniture and other hazards from the area - Provide verbal assurance as the individual is regaining consciousness - Provide the patient with privacy if possible - Call for help and start treatment as ordered by the caregiver   After the Seizure (Postictal Stage)  After a seizure, most patients experience confusion, fatigue, muscle pain and/or a headache. Thus, one should permit the individual to sleep. For the next few days, reassurance is essential. Being calm and  helping reorient the person is also of importance.  Most seizures are painless and end spontaneously. Seizures are not harmful to others but can lead to complications such as stress on the lungs, brain and the heart. Individuals with prior lung problems may develop labored breathing and respiratory distress.    Discussed Patients with epilepsy have a small risk of sudden unexpected death, a condition referred to as sudden unexpected death in epilepsy (SUDEP). SUDEP is defined specifically as the sudden, unexpected, witnessed or unwitnessed, nontraumatic and nondrowning death in patients with epilepsy with or without evidence for a seizure, and excluding documented status epilepticus, in which post mortem examination does not reveal a structural or toxicologic cause for death     Orders Placed This Encounter  Procedures   Valproic  Acid Level   CMP   AMBULATORY EEG    No orders of the defined types were placed in this encounter.   Return in about 2 months (around 05/30/2024).   Pastor Falling, MD 03/29/2024, 4:37 PM  [facility] [address], [address] [place], [place] [address] [phone]

## 2024-03-29 NOTE — Patient Instructions (Addendum)
 Continue with Depakote  500 mg twice daily, refill given. Last Depakote  level 69 Will obtain a 3-day ambulatory EEG to rule out subclinical seizures, if any seizure, We will most likely increase the Depakote  Continue your other medications Follow-up with psychiatry regarding possibilities of starting another antipsychotic or mood stabilizer Follow up as schedule on November 11.

## 2024-03-30 ENCOUNTER — Ambulatory Visit: Payer: Self-pay | Admitting: Neurology

## 2024-03-30 ENCOUNTER — Telehealth: Payer: Self-pay

## 2024-03-30 LAB — COMPREHENSIVE METABOLIC PANEL WITH GFR
ALT: 19 IU/L (ref 0–32)
AST: 20 IU/L (ref 0–40)
Albumin: 4.5 g/dL (ref 3.8–4.8)
Alkaline Phosphatase: 49 IU/L (ref 44–121)
BUN/Creatinine Ratio: 17 (ref 12–28)
BUN: 23 mg/dL (ref 8–27)
Bilirubin Total: 0.3 mg/dL (ref 0.0–1.2)
CO2: 22 mmol/L (ref 20–29)
Calcium: 9.3 mg/dL (ref 8.7–10.3)
Chloride: 102 mmol/L (ref 96–106)
Creatinine, Ser: 1.38 mg/dL — ABNORMAL HIGH (ref 0.57–1.00)
Globulin, Total: 2.7 g/dL (ref 1.5–4.5)
Glucose: 140 mg/dL — ABNORMAL HIGH (ref 70–99)
Potassium: 4.1 mmol/L (ref 3.5–5.2)
Sodium: 140 mmol/L (ref 134–144)
Total Protein: 7.2 g/dL (ref 6.0–8.5)
eGFR: 41 mL/min/1.73 — ABNORMAL LOW (ref >59)

## 2024-03-30 LAB — VALPROIC ACID LEVEL: Valproic Acid Lvl: 90 ug/mL (ref 50–100)

## 2024-03-30 NOTE — Progress Notes (Signed)
 Please call and advise the patient that the recent labs we checked showed a normal depakote  level. No further action is required on these tests at this time. Please remind patient to keep any upcoming appointments or tests and to call us  with any interim questions, concerns, problems or updates. Thanks,   Pastor Falling, MD

## 2024-03-30 NOTE — Telephone Encounter (Signed)
 EMAIL SENT TO [facility]

## 2024-04-03 ENCOUNTER — Ambulatory Visit (AMBULATORY_SURGERY_CENTER)

## 2024-04-03 VITALS — Ht 64.0 in | Wt 199.0 lb

## 2024-04-03 DIAGNOSIS — Z1211 Encounter for screening for malignant neoplasm of colon: Secondary | ICD-10-CM

## 2024-04-03 MED ORDER — NA SULFATE-K SULFATE-MG SULF 17.5-3.13-1.6 GM/177ML PO SOLN: 1.0000 | Freq: Once | ORAL | 0 refills | Status: AC

## 2024-04-03 NOTE — Progress Notes (Signed)
 No egg or soy allergy known to patient  No issues known to pt with past sedation with any surgeries or procedures Patient denies ever being told they had issues or difficulty with intubation  No FH of Malignant Hyperthermia Pt is not on diet pills Pt is not on  home 02  Pt is not on blood thinners  Past issues with constipation per pt daughter she reports improvement  No A fib or A flutter Have any cardiac testing pending--no  LOA: independent takes small steps does have intermit parkinson's  Prep: suprep   Patient's chart reviewed by Norleen Schillings CNRA prior to previsit and patient appropriate for the [facility].  Previsit completed and red dot placed by patient's name on their procedure day (on provider's schedule).     PV completed with patient. Prep instructions sent via mychart and home address.

## 2024-04-06 ENCOUNTER — Other Ambulatory Visit: Payer: Self-pay

## 2024-04-06 DIAGNOSIS — R0602 Shortness of breath: Secondary | ICD-10-CM

## 2024-04-06 DIAGNOSIS — Z599 Problem related to housing and economic circumstances, unspecified: Secondary | ICD-10-CM

## 2024-04-06 DIAGNOSIS — R062 Wheezing: Secondary | ICD-10-CM

## 2024-04-06 NOTE — Patient Instructions (Signed)
 Visit Information  Thank you for taking time to visit with me today. Please don't hesitate to contact me if I can be of assistance to you before our next scheduled appointment.  Our next appointment is by telephone on 04/19/2024 at 3:30 pm Please call the care guide team at [phone] if you need to cancel or reschedule your appointment.   Following is a copy of your care plan:   Goals Addressed             This Visit's Progress    VBCI RN Care Plan - HTN/Falls       Problems:  Chronic Disease Management support and education needs related to HTN, Falls  Goal: Over the next 90 days the Caregiver Patient will attend all scheduled medical appointments: Patient will attend all appointments as evidenced by chart review        collaborate with the care management team towards completion of advanced directives vs guardianship as evidenced by keeping appointment and discussing with SW  continue to work with RN Care Manager and/or Social Worker to address care management and care coordination needs related to HTN and Falls as evidenced by adherence to care management team scheduled appointments     take all medications exactly as prescribed and will call provider for medication related questions as evidenced by caregiver reporting patient compliance with medications    verbalize basic understanding of HTN and Falls disease process and self health management plan as evidenced by taking medications as prescribed, calling provider with concerns/falls, checking BP daily and recording, lifestyle modifications: diet, exercise, quitting smoking, following fall prevention guidelines provided   Interventions:   Falls Interventions: Provided written and verbal education re: potential causes of falls and Fall prevention strategies Reviewed medications and discussed potential side effects of medications such as dizziness and frequent urination Advised patient of importance of notifying provider of  falls Assessed for signs and symptoms of orthostatic hypotension Assessed for falls since last encounter   Hypertension Interventions: Last practice recorded BP readings:  BP Readings from Last 3 Encounters:  03/29/24 128/80  02/11/24 136/80  01/31/24 (!) 144/69   Most recent eGFR/CrCl:  Lab Results  Component Value Date   EGFR 41 (L) 03/29/2024    No components found for: CRCL  Evaluation of current treatment plan related to hypertension self management and patient's adherence to plan as established by provider Provided education to patient re: stroke prevention, s/s of heart attack and stroke Reviewed medications with patient and discussed importance of compliance Provided assistance with obtaining home blood pressure monitor via request PCP order; Counseled on the importance of exercise goals with target of 150 minutes per week Advised patient, providing education and rationale, to monitor blood pressure daily and record, calling PCP for findings outside established parameters Provided education on prescribed diet cardiac healthy/low salt Discussed complications of poorly controlled blood pressure such as heart disease, stroke, circulatory complications, vision complications, kidney impairment, sexual dysfunction Screening for signs and symptoms of depression related to chronic disease state  Assessed social determinant of health barriers  Patient Self-Care Activities:  Attend all scheduled provider appointments Call pharmacy for medication refills 3-7 days in advance of running out of medications Call provider office for new concerns or questions  Take medications as prescribed   Work with the social worker to address care coordination needs and will continue to work with the clinical team to address health care and disease management related needs check blood pressure daily learn about high blood  pressure keep a blood pressure log take blood pressure log to all doctor  appointments call doctor for signs and symptoms of high blood pressure keep all doctor appointments take medications for blood pressure exactly as prescribed report new symptoms to your doctor eat more whole grains, fruits and vegetables, lean meats and healthy fats limit salt intake  Plan:  Telephone follow up appointment with care management team member scheduled for:  04/19/2024 at 3:30 pm          VBCI RN Care Plan - Seizures       Problems:  Chronic Disease Management support and education needs related to Seizures, HTN, Falls  Goal: Over the next 90 days the Patient will attend all scheduled medical appointments: Patient will attend all appointments as evidenced by chart review        continue to work with RN Care Manager and/or Social Worker to address care management and care coordination needs related to Seizures as evidenced by adherence to care management team scheduled appointments     take all medications exactly as prescribed and will call provider for medication related questions as evidenced by caregiver reporting patient taking all meds as ordered    verbalize basic understanding of Seizure disease process and self health management plan as evidenced by taking all medications as prescribed and on time to prevent breakthrough seizures, reporting any breakthrough seizure activity to provider immediately, calling 911 if need to use diazepam  for seizure lasting >2 min per provider instructions, completing overnight testing for eval if nocturnal seizures, regular schedule for meals and remaining hydrated, getting adequate rest, avoid getting overheated, treat immediately for fevers, contact provider with concerns/questions.   Interventions:   Evaluation of current treatment plan related to Seizures, Patient with Manic/Depression - Caregiver managing self-management and patient's adherence to plan as established by provider. Discussed plans with patient for ongoing care  management follow up and provided patient with direct contact information for care management team Evaluation of current treatment plan related to Seizures and patient's adherence to plan as established by provider Provided education to patient and/or caregiver about advanced directives Provided education to patient re: Seizures Reviewed medications with patient and discussed importance of on time and no missed doses to prevent breakthrough seizures Social Work referral for [employer], utilities, food stamp reapplication. Advance Directive/Guardianship, incontinence product cost Screening for signs and symptoms of depression related to chronic disease state - patient currently in depressive episode - declined, resting Assessed social determinant of health barriers  Patient Self-Care Activities:  Attend all scheduled provider appointments Call pharmacy for medication refills 3-7 days in advance of running out of medications Call provider office for new concerns or questions  Take medications as prescribed   Work with the social worker to address care coordination needs and will continue to work with the clinical team to address health care and disease management related needs  Plan:  Telephone follow up appointment with care management team member scheduled for:  04/19/2024 at 3:30 pm             Please call the Suicide and Crisis Lifeline: 988 call the [place]  National Suicide Prevention Lifeline: 778-405-8849 or TTY: 438-403-1558 TTY 872-401-1975) to talk to a trained counselor call 1-800-273-TALK (toll free, 24 hour hotline) go to [facility] [address] [place] [phone]) call 911 if you are experiencing a Mental Health or Behavioral Health Crisis or need someone to talk to.  The patient verbalized understanding of instructions,  educational materials, and care plan provided today and agreed to receive a mailed copy of patient  instructions, educational materials, and care plan.   Nestora Duos, MSN, RN [place]  [facility], [facility] RN Care Manager Direct Dial: [phone] Fax: [phone]  Seizure, Adult A seizure is a sudden burst of abnormal activity in the brain. Seizures usually last from 30 seconds to 2 minutes. There are many types of seizures. And they can cause many different symptoms. What are the causes? Common causes of a seizure include: Fever or infection. Problems that affect the brain. These may include: A brain or head injury. A stroke. A brain tumor. Low levels of blood sugar or salt. Kidney problems or liver problems. Some inherited conditions. These are passed down from parent to child. Problems with a substance, such as: Having a reaction to a drug or a medicine. Stopping the use of a substance all of a sudden. When this causes problems, it's called withdrawal. Disorders that affect how you develop, such as autism spectrum disorder or cerebral palsy. Sometimes, the cause may not be known. Some people who have a seizure never have another one. A person who has repeated seizures over time without a clear cause has a condition called epilepsy. What increases the risk? Having a family history of epilepsy. Having had a tonic-clonic seizure before. This type of seizure causes: The muscles of the whole body to tighten, or contract. Loss of consciousness. Having a head injury or a stroke in the past. Having had too little oxygen at birth. What are the signs or symptoms? The symptoms vary depending on the type of seizure you have. Symptoms during a seizure Having convulsions. This means shaking with fast, jerky movements of muscles. Stiffness of the body. Breathing problems. Being confused. Staring or not responding to sound or touch. Head nodding, eye blinking, eye twitching, or fast eye movements. Drooling, grunting, or making clicking sounds with  your mouth. Losing control of when you pee or poop. Symptoms before a seizure Feeling afraid, worried, or nervous. Feeling like you may vomit. Vertigo. This feels like: You are moving when you're not. Things around you are moving when they're not. Dj vu. This is a feeling of having seen or heard something before. Odd tastes or smells. Changes in how you see. You may see flashing lights or spots. Symptoms after a seizure Being confused. Feeling sleepy. Headache. Sore muscles. How is this diagnosed? A seizure may be diagnosed based on: A description of your symptoms. Video of your seizures can be helpful. Your medical history. A physical exam. Tests, such as: Blood tests. CT scan. MRI. Electroencephalogram, or EEG. This test measures electrical activity in the brain. A test of your spinal fluid. This is called a spinal tap or lumbar puncture. How is this treated? If your seizure stops on its own, you will not need treatment. If your seizure lasts longer than 5 minutes, you'll normally need treatment. This may include: Medicines given through an IV. Avoiding things, such as medicines, that are known to cause your seizures. Medicines to prevent seizures. These are called antiepileptics. A device to prevent or control seizures. Eating foods that are low in carbohydrates and high in fat (ketogenic diet). Surgery. This is sometimes needed if you keep having seizures. Follow these instructions at home: Medicines Take your medicines only as told by your health care provider. Avoid anything that may keep your medicine from working, such as alcohol. Activity Follow your provider's advice about driving,  swimming, and doing other things that would be dangerous if you had a seizure. Wait until your provider says it's safe for you to do these things. If you live in the [place] ask your local department of motor vehicles [facility]) when you can drive. Get enough rest and sleep. Not getting  enough sleep can make seizures more likely to happen. Teaching others  Teach friends and family what to do if you have a seizure. Tell them to: Help you get down to the ground safely. Protect your head and body. Loosen any clothing around your neck. Turn you on your side. This helps keep your airway clear if you vomit. Know whether or not you need emergency care. Stay with you until you are better. Also, tell them what not to do if you have a seizure. Tell them: They should not hold you down. They should not put anything in your mouth. General instructions Avoid anything that has caused you to have seizures. Keep a seizure diary. Write down: What you remember about each seizure. What you think might have caused each seizure. Keep all follow-up visits. Your provider may need to monitor your progress. Contact a health care provider if: You have another seizure or seizures. Call each time you have a seizure. You have a change in how often or when you have seizures. You keep having seizures with treatment. You have symptoms of being sick or having an infection. You are not able to take your medicine. Get help right away if: You have or someone has seen you have: A seizure that lasts longer than 5 minutes. Many seizures in a row and you don't feel better between seizures. A seizure that makes it harder to breathe. A seizure that leaves you unable to speak or use a part of your body. You didn't wake up right away after a seizure. You injure yourself during a seizure. You have confusion or pain right after a seizure. These symptoms may be an emergency. Call 911 right away. Do not wait to see if the symptoms will go away. Do not drive yourself to the hospital. This information is not intended to replace advice given to you by your health care provider. Make sure you discuss any questions you have with your health care provider. Document Revised: 04/08/2023 Document Reviewed:  08/19/2022 Elsevier Patient Education  2024 [employer]    Managing Your Hypertension Hypertension, also called high blood pressure, is when the force of the blood pressing against the walls of the arteries is too strong. Arteries are blood vessels that carry blood from your heart throughout your body. Hypertension forces the heart to work harder to pump blood and may cause the arteries to become narrow or stiff. Understanding blood pressure readings A blood pressure reading includes a higher number over a lower number: The first, or top, number is called the systolic pressure. It is a measure of the pressure in your arteries as your heart beats. The second, or bottom number, is called the diastolic pressure. It is a measure of the pressure in your arteries as the heart relaxes. For most people, a normal blood pressure is below 120/80. Your personal target blood pressure may vary depending on your medical conditions, your age, and other factors. Blood pressure is classified into four stages. Based on your blood pressure reading, your health care provider may use the following stages to determine what type of treatment you need, if any. Systolic pressure and diastolic pressure are measured in a unit  called millimeters of mercury (mmHg). Normal Systolic pressure: below 120. Diastolic pressure: below 80. Elevated Systolic pressure: 120-129. Diastolic pressure: below 80. Hypertension stage 1 Systolic pressure: 130-139. Diastolic pressure: 80-89. Hypertension stage 2 Systolic pressure: 140 or above. Diastolic pressure: 90 or above. How can this condition affect me? Managing your hypertension is very important. Over time, hypertension can damage the arteries and decrease blood flow to parts of the body, including the brain, heart, and kidneys. Having untreated or uncontrolled hypertension can lead to: A heart attack. A stroke. A weakened blood vessel (aneurysm). Heart failure. Kidney  damage. Eye damage. Memory and concentration problems. Vascular dementia. What actions can I take to manage this condition? Hypertension can be managed by making lifestyle changes and possibly by taking medicines. Your health care provider will help you make a plan to bring your blood pressure within a normal range. You may be referred for counseling on a healthy diet and physical activity. Nutrition  Eat a diet that is high in fiber and potassium, and low in salt (sodium), added sugar, and fat. An example eating plan is called the DASH diet. DASH stands for Dietary Approaches to Stop Hypertension. To eat this way: Eat plenty of fresh fruits and vegetables. Try to fill one-half of your plate at each meal with fruits and vegetables. Eat whole grains, such as whole-wheat pasta, brown rice, or whole-grain bread. Fill about one-fourth of your plate with whole grains. Eat low-fat dairy products. Avoid fatty cuts of meat, processed or cured meats, and poultry with skin. Fill about one-fourth of your plate with lean proteins such as fish, chicken without skin, beans, eggs, and tofu. Avoid pre-made and processed foods. These tend to be higher in sodium, added sugar, and fat. Reduce your daily sodium intake. Many people with hypertension should eat less than 1,500 mg of sodium a day. Lifestyle  Work with your health care provider to maintain a healthy body weight or to lose weight. Ask what an ideal weight is for you. Get at least 30 minutes of exercise that causes your heart to beat faster (aerobic exercise) most days of the week. Activities may include walking, swimming, or biking. Include exercise to strengthen your muscles (resistance exercise), such as weight lifting, as part of your weekly exercise routine. Try to do these types of exercises for 30 minutes at least 3 days a week. Do not use any products that contain nicotine or tobacco. These products include cigarettes, chewing tobacco, and vaping  devices, such as e-cigarettes. If you need help quitting, ask your health care provider. Control any long-term (chronic) conditions you have, such as high cholesterol or diabetes. Identify your sources of stress and find ways to manage stress. This may include meditation, deep breathing, or making time for fun activities. Alcohol use Do not drink alcohol if: Your health care provider tells you not to drink. You are pregnant, may be pregnant, or are planning to become pregnant. If you drink alcohol: Limit how much you have to: 0-1 drink a day for women. 0-2 drinks a day for men. Know how much alcohol is in your drink. In the [place] one drink equals one 12 oz bottle of beer (355 mL), one 5 oz glass of wine (148 mL), or one 1 oz glass of hard liquor (44 mL). Medicines Your health care provider may prescribe medicine if lifestyle changes are not enough to get your blood pressure under control and if: Your systolic blood pressure is 130 or higher. Your diastolic blood  pressure is 80 or higher. Take medicines only as told by your health care provider. Follow the directions carefully. Blood pressure medicines must be taken as told by your health care provider. The medicine does not work as well when you skip doses. Skipping doses also puts you at risk for problems. Monitoring Before you monitor your blood pressure: Do not smoke, drink caffeinated beverages, or exercise within 30 minutes before taking a measurement. Use the bathroom and empty your bladder (urinate). Sit quietly for at least 5 minutes before taking measurements. Monitor your blood pressure at home as told by your health care provider. To do this: Sit with your back straight and supported. Place your feet flat on the floor. Do not cross your legs. Support your arm on a flat surface, such as a table. Make sure your upper arm is at heart level. Each time you measure, take two or three readings one minute apart and record the  results. You may also need to have your blood pressure checked regularly by your health care provider. General information Talk with your health care provider about your diet, exercise habits, and other lifestyle factors that may be contributing to hypertension. Review all the medicines you take with your health care provider because there may be side effects or interactions. Keep all follow-up visits. Your health care provider can help you create and adjust your plan for managing your high blood pressure. Where to find more information National Heart, Lung, and Blood Institute: [employer]is American Heart Association: www.heart.org Contact a health care provider if: You think you are having a reaction to medicines you have taken. You have repeated (recurrent) headaches. You feel dizzy. You have swelling in your ankles. You have trouble with your vision. Get help right away if: You develop a severe headache or confusion. You have unusual weakness or numbness, or you feel faint. You have severe pain in your chest or abdomen. You vomit repeatedly. You have trouble breathing. These symptoms may be an emergency. Get help right away. Call 911. Do not wait to see if the symptoms will go away. Do not drive yourself to the hospital. Summary Hypertension is when the force of blood pumping through your arteries is too strong. If this condition is not controlled, it may put you at risk for serious complications. Your personal target blood pressure may vary depending on your medical conditions, your age, and other factors. For most people, a normal blood pressure is less than 120/80. Hypertension is managed by lifestyle changes, medicines, or both. Lifestyle changes to help manage hypertension include losing weight, eating a healthy, low-sodium diet, exercising more, stopping smoking, and limiting alcohol. This information is not intended to replace advice given to you by your health care  provider. Make sure you discuss any questions you have with your health care provider. Document Revised: 03/20/2021 Document Reviewed: 03/20/2021 Elsevier Patient Education  2024 [employer].   Fall Prevention in the Home, Adult Falls can cause injuries and can happen to people of all ages. There are many things you can do to make your home safer and to help prevent falls. What actions can I take to prevent falls? General information Use good lighting in all rooms. Make sure to: Replace any light bulbs that burn out. Turn on the lights in dark areas and use night-lights. Keep items that you use often in easy-to-reach places. Lower the shelves around your home if needed. Move furniture so that there are clear paths around it. Do not use  throw rugs or other things on the floor that can make you trip. If any of your floors are uneven, fix them. Add color or contrast paint or tape to clearly mark and help you see: Grab bars or handrails. First and last steps of staircases. Where the edge of each step is. If you use a ladder or stepladder: Make sure that it is fully opened. Do not climb a closed ladder. Make sure the sides of the ladder are locked in place. Have someone hold the ladder while you use it. Know where your pets are as you move through your home. What can I do in the bathroom?     Keep the floor dry. Clean up any water  on the floor right away. Remove soap buildup in the bathtub or shower. Buildup makes bathtubs and showers slippery. Use non-skid mats or decals on the floor of the bathtub or shower. Attach bath mats securely with double-sided, non-slip rug tape. If you need to sit down in the shower, use a non-slip stool. Install grab bars by the toilet and in the bathtub and shower. Do not use towel bars as grab bars. What can I do in the bedroom? Make sure that you have a light by your bed that is easy to reach. Do not use any sheets or blankets on your bed that hang to  the floor. Have a firm chair or bench with side arms that you can use for support when you get dressed. What can I do in the kitchen? Clean up any spills right away. If you need to reach something above you, use a step stool with a grab bar. Keep electrical cords out of the way. Do not use floor polish or wax that makes floors slippery. What can I do with my stairs? Do not leave anything on the stairs. Make sure that you have a light switch at the top and the bottom of the stairs. Make sure that there are handrails on both sides of the stairs. Fix handrails that are broken or loose. Install non-slip stair treads on all your stairs if they do not have carpet. Avoid having throw rugs at the top or bottom of the stairs. Choose a carpet that does not hide the edge of the steps on the stairs. Make sure that the carpet is firmly attached to the stairs. Fix carpet that is loose or worn. What can I do on the outside of my home? Use bright outdoor lighting. Fix the edges of walkways and driveways and fix any cracks. Clear paths of anything that can make you trip, such as tools or rocks. Add color or contrast paint or tape to clearly mark and help you see anything that might make you trip as you walk through a door, such as a raised step or threshold. Trim any bushes or trees on paths to your home. Check to see if handrails are loose or broken and that both sides of all steps have handrails. Install guardrails along the edges of any raised decks and porches. Have leaves, snow, or ice cleared regularly. Use sand, salt, or ice melter on paths if you live where there is ice and snow during the winter. Clean up any spills in your garage right away. This includes grease or oil spills. What other actions can I take? Review your medicines with your doctor. Some medicines can cause dizziness or changes in blood pressure, which increase your risk of falling. Wear shoes that: Have a low heel. Do not  wear high  heels. Have rubber bottoms and are closed at the toe. Feel good on your feet and fit well. Use tools that help you move around if needed. These include: Canes. Walkers. Scooters. Crutches. Ask your doctor what else you can do to help prevent falls. This may include seeing a physical therapist to learn to do exercises to move better and get stronger. Where to find more information Centers for Disease Control and Prevention, STEADI: TonerPromos.no [employer] on Aging: BaseRingTones.pl National Institute on Aging: BaseRingTones.pl Contact a doctor if: You are afraid of falling at home. You feel weak, drowsy, or dizzy at home. You fall at home. Get help right away if you: Lose consciousness or have trouble moving after a fall. Have a fall that causes a head injury. These symptoms may be an emergency. Get help right away. Call 911. Do not wait to see if the symptoms will go away. Do not drive yourself to the hospital. This information is not intended to replace advice given to you by your health care provider. Make sure you discuss any questions you have with your health care provider. Document Revised: 03/09/2022 Document Reviewed: 03/09/2022 Elsevier Patient Education  2024 [employer].  Advance Directive  Advance directives are legal papers that state your wishes about health care decisions. They let your wishes be known to family, friends, and health care providers if you become unable to speak for yourself.  You should write these papers out over time rather than all at once. They can be changed and updated at any time. The types of advance directives include: Medical power of attorney (POA). Living wills. Do not resuscitate (DNR) or do not attempt resuscitation (DNAR) orders. What are a health care proxy and medical POA? A health care proxy is also called a health care agent. It's a person you choose to make medical decisions for you when you can't make them for yourself. In most cases,  a proxy is a trusted friend or family member. A medical POA is legal paperwork that names your proxy. It may need to be: Signed. Notarized. Dated. Copied. Witnessed. Added to your medical record. You may also want to choose someone to handle your money if you can't do so. This is called a durable POA for finances. It's separate from a medical POA. You may choose your health care proxy or someone else to act as your agent in money matters. If you don't have a proxy, or if the proxy may not be acting in your best interest, a court may choose a guardian to act on your behalf. What is a living will? A living will is legal paperwork that states your wishes about medical care. Providers should keep a copy of it in your medical record. You may want to give a copy to family members or friends. You can also keep a card in your wallet to let loved ones know you have a living will and where they can find it. A living will may be used if: You're very sick with something that will end your life. You become disabled. You can't make decisions or speak for yourself. Your living will should include whether: To use or not use life support equipment. This may include machines to filter your blood or to help you breathe. You want a DNR or DNAR order. This tells providers not to use CPR if your heart or breathing stops. To use or not use tube feeding. You want to be given foods and  fluids. You want a type of comfort care called palliative care. This may be given when the goal for treatment becomes comfort rather than a cure. You want to donate your organs and tissues. A living will doesn't say what to do with your money and property if you pass away. What is a DNR or DNAR? A DNR or DNAR order is a request not to have CPR. If you don't have one of these orders, a provider will try to help you if your heart stops or you stop breathing.  If you plan to have surgery, talk with your provider about your DNR or DNAR  order. What happens if I don't have an advance directive? Each state has its own laws about advance directives. Some states assign family decision makers to act on your behalf if you don't have an advance directive.  Check with your provider, attorney, or state representative about the laws in your state. Where to find more information Each state has its own laws about advance directives. You can look up these laws at: https://rodriguez-phillips.com/ This information is not intended to replace advice given to you by your health care provider. Make sure you discuss any questions you have with your health care provider. Document Revised: 11/23/2022 Document Reviewed: 11/23/2022 Elsevier Patient Education  2024 [employer].

## 2024-04-06 NOTE — Patient Outreach (Signed)
 Complex Care Management   Visit Note  04/06/2024  Name:  Shelby Jordan MRN: [id number] DOB: 1950-09-13  Situation: Referral received for Complex Care Management related to Seizures, HTN Falls I obtained verbal consent from Caregiver.  Visit completed with Caregiver  on the phone  Background:   Past Medical History:  Diagnosis Date   Bipolar 1 disorder (HCC)    Depression    Hepatitis C    High cholesterol    Hypertension    Seizures (HCC)    UTI (urinary tract infection)     Assessment: Patient Reported Symptoms:  Cognitive Cognitive Status: Able to follow simple commands, Struggling with memory recall, Alert and oriented to person, place, and time, Difficulties with attention and concentration, Normal speech and language skills, Requires Assistance Decision Making, Poor judgment in daily scenarios (chart noted lacks insight, little verbalization when depressed episodes, Manic at times - better at taking care of self, depressed she needs a lot of help)   Health Maintenance Behaviors: Annual physical exam Healing Pattern: Slow  Neurological Neurological Review of Symptoms: Weakness Neurological Comment: generalized weakness - depressive episode  HEENT HEENT Symptoms Reported: No symptoms reported HEENT Management Strategies: Routine screening    Cardiovascular Cardiovascular Symptoms Reported: No symptoms reported Cardiovascular Management Strategies: Routine screening, Medication therapy  Respiratory Respiratory Symptoms Reported: Shortness of breath Other Respiratory Symptoms: DOE, resolved with brief rest, Additional Respiratory Details: sending request for Ventolin  refill Respiratory Management Strategies: Routine screening  Endocrine Endocrine Symptoms Reported: Not assessed    Gastrointestinal Gastrointestinal Symptoms Reported: No symptoms reported Additional Gastrointestinal Details: constipation managed with figs/dates, rarely miralax Shelby Jordan      Genitourinary  Genitourinary Symptoms Reported: Incontinence Additional Genitourinary Details: incontinent of urine, strong smell all the time, does hydrate per daughter    Integumentary Integumentary Symptoms Reported: No symptoms reported Additional Integumentary Details: daughter routinely assesses skin for pressure sores during depressive episodes    Musculoskeletal Musculoskelatal Symptoms Reviewed: Weakness, Difficulty walking, Unsteady gait Additional Musculoskeletal Details: issues walking with depressive episode Musculoskeletal Management Strategies: Routine screening Falls in the past year?: Yes Number of falls in past year: 2 or more Was there an injury with Fall?: Yes Fall Risk Category Calculator: 3 Patient Fall Risk Level: High Fall Risk Patient at Risk for Falls Due to: Mental status change, Impaired balance/gait, History of fall(s) (fel and hit head on concrete - start of seizures) Fall risk Follow up: Education provided, Falls evaluation completed, Falls prevention discussed  Psychosocial Psychosocial Symptoms Reported: Depression - if selected complete PHQ 2-9 Other Psychosocial Conditions: currently depressie episode - can be manic up to months at a time, ACT program, waiting list for CAP program, [facility] - psychiatrist Additional Psychological Details: states would like to ID hospital for when she is manic because they can quickly get her calmed Behavioral Management Strategies: Medication therapy, Counseling   Quality of Family Relationships: helpful    04/06/2024    PHQ2-9 Depression Screening   Little interest or pleasure in doing things    Feeling down, depressed, or hopeless    PHQ-2 - Total Score    Trouble falling or staying asleep, or sleeping too much    Feeling tired or having little energy    Poor appetite or overeating     Feeling bad about yourself - or that you are a failure or have let yourself or your family down    Trouble concentrating on things, such as reading  the newspaper or watching television    Moving or speaking  so slowly that other people could have noticed.  Or the opposite - being so fidgety or restless that you have been moving around a lot more than usual    Thoughts that you would be better off dead, or hurting yourself in some way    PHQ2-9 Total Score    If you checked off any problems, how difficult have these problems made it for you to do your work, take care of things at home, or get along with other people    Depression Interventions/Treatment      There were no vitals filed for this visit.  Medications Reviewed Today   Medications were not reviewed in this encounter     Recommendation:   PCP Follow-up Continue Current Plan of Care  Follow Up Plan:   Telephone follow-up 2 Shelby Jordan  Shelby Duos, MSN, RN [facility]  [facility], [facility] RN Care Manager Direct Dial: [phone] Fax: [phone]

## 2024-04-08 DIAGNOSIS — Z87891 Personal history of nicotine dependence: Secondary | ICD-10-CM | POA: Diagnosis not present

## 2024-04-08 DIAGNOSIS — Z87440 Personal history of urinary (tract) infections: Secondary | ICD-10-CM | POA: Diagnosis not present

## 2024-04-08 DIAGNOSIS — Z7951 Long term (current) use of inhaled steroids: Secondary | ICD-10-CM | POA: Diagnosis not present

## 2024-04-08 DIAGNOSIS — I129 Hypertensive chronic kidney disease with stage 1 through stage 4 chronic kidney disease, or unspecified chronic kidney disease: Secondary | ICD-10-CM | POA: Diagnosis not present

## 2024-04-08 DIAGNOSIS — E7800 Pure hypercholesterolemia, unspecified: Secondary | ICD-10-CM | POA: Diagnosis not present

## 2024-04-08 DIAGNOSIS — Z89429 Acquired absence of other toe(s), unspecified side: Secondary | ICD-10-CM | POA: Diagnosis not present

## 2024-04-08 DIAGNOSIS — Z9181 History of falling: Secondary | ICD-10-CM | POA: Diagnosis not present

## 2024-04-08 DIAGNOSIS — N1832 Chronic kidney disease, stage 3b: Secondary | ICD-10-CM | POA: Diagnosis not present

## 2024-04-10 ENCOUNTER — Encounter: Payer: Self-pay | Admitting: Podiatry

## 2024-04-10 ENCOUNTER — Ambulatory Visit (INDEPENDENT_AMBULATORY_CARE_PROVIDER_SITE_OTHER): Admitting: Podiatry

## 2024-04-10 DIAGNOSIS — M79674 Pain in right toe(s): Secondary | ICD-10-CM | POA: Diagnosis not present

## 2024-04-10 DIAGNOSIS — Z89422 Acquired absence of other left toe(s): Secondary | ICD-10-CM

## 2024-04-10 DIAGNOSIS — L84 Corns and callosities: Secondary | ICD-10-CM

## 2024-04-10 DIAGNOSIS — M79675 Pain in left toe(s): Secondary | ICD-10-CM

## 2024-04-10 DIAGNOSIS — B351 Tinea unguium: Secondary | ICD-10-CM | POA: Diagnosis not present

## 2024-04-10 MED ORDER — ALBUTEROL SULFATE HFA 108 (90 BASE) MCG/ACT IN AERS: 2.0000 | INHALATION_SPRAY | RESPIRATORY_TRACT | 1 refills | Status: AC | PRN

## 2024-04-10 NOTE — Addendum Note (Signed)
 Addended by: Chalene Treu K on: 04/10/2024 06:58 PM   Modules accepted: Orders

## 2024-04-10 NOTE — Progress Notes (Signed)
 Sent as requested.

## 2024-04-11 ENCOUNTER — Other Ambulatory Visit: Payer: Self-pay

## 2024-04-11 DIAGNOSIS — N3941 Urge incontinence: Secondary | ICD-10-CM

## 2024-04-11 NOTE — Patient Outreach (Signed)
 Complex Care Management   Visit Note  04/11/2024  Name:  Shelby Jordan MRN: [id number] DOB: Sep 02, 1950  Situation: Referral received for Complex Care Management related to SDOH Barriers:  Food insecurity Incontinence supplies and blood pressure cuff I obtained verbal consent from Caregiver.  Visit completed with Caregiver  on the phone  Background:   Past Medical History:  Diagnosis Date   Bipolar 1 disorder (HCC)    Depression    Hepatitis C    High cholesterol    Hypertension    Seizures (HCC)    UTI (urinary tract infection)     Assessment: SW completed a telephone outreach with patients daughter, she states patient is in need of incontinence supplies, food and a blood pressure monitor. Patient was receiving foodstamps but they stopped. SW encourage daughter they can go online to reapply. Patient is already on the waitlist for MOW. SW submitted a referral for one step further for food delivery. SW will send new PCP a message for a script to be sent in to [facility] for a blood pressure and a script for incontinence supplies.   SDOH Interventions    Flowsheet Row Patient Outreach Telephone from 04/11/2024 in [place] POPULATION HEALTH DEPARTMENT Patient Outreach Telephone from 04/06/2024 in [place] POPULATION HEALTH DEPARTMENT Telephone from 02/21/2024 in [place] POPULATION HEALTH DEPARTMENT Patient Outreach Telephone from 02/02/2024 in [place] POPULATION HEALTH DEPARTMENT Telephone from 02/01/2024 in [place] POPULATION HEALTH DEPARTMENT Clinical Support from 08/26/2023 in [facility]  SDOH Interventions        Food Insecurity Interventions Community Resources Provided AMB Referral  [Ref to BSW asssit to reapply for Food [employer] Resources Provided Intervention Not Indicated Intervention Not Indicated Intervention Not Indicated  Housing Interventions -- Intervention Not Indicated -- Intervention Not Indicated Intervention  Not Indicated Intervention Not Indicated  Transportation Interventions -- Intervention Not Indicated -- Intervention Not Indicated Intervention Not Indicated Intervention Not Indicated  Utilities Interventions -- AMB Referral  [Ref BSW- etting family assist with Utilities at this time] -- Intervention Not Indicated Intervention Not Indicated Intervention Not Indicated  Alcohol Usage Interventions -- -- -- -- -- Intervention Not Indicated (Score <7)  Financial Strain Interventions -- -- -- -- -- Intervention Not Indicated  Physical Activity Interventions -- -- -- -- -- Intervention Not Indicated  Stress Interventions -- -- -- -- -- Intervention Not Indicated  Social Connections Interventions -- -- -- -- -- Intervention Not Indicated    Recommendation:    No recommendations at this time  Follow Up Plan:   Telephone follow-up on 04/25/24 at 3pm  Thersia Hoar, BSW, MHA [place]  [facility] Social Worker, Population Health [phone]

## 2024-04-11 NOTE — Patient Instructions (Signed)
 Visit Information  Thank you for taking time to visit with me today. Please don't hesitate to contact me if I can be of assistance to you before our next scheduled appointment.  Our next appointment is by telephone on 04/25/24 at 3pm Please call the care guide team at [phone] if you need to cancel or reschedule your appointment.   Following is a copy of your care plan:   Goals Addressed             This Visit's Progress    BSW [facility] Social Work Care Plan       Problems:   Food Insecurity  and incontinence supplies and blood pressure cuff  CSW Clinical Goal(s):   Over the next 30 days the Patient will work with Child psychotherapist to address concerns related to food, incontinence supplies and blood pressure cuff.  Interventions:  SW will send PCP a message for a script for incontinence supplies and blood pressure cuff. SW will send resources for supplies if Medicaid does not cover.  Patient Goals/Self-Care Activities:  Patient and daughter will [employer] send and apply for foodstamps  Plan:   SW will follow up on 04/25/24 at 3pm         Please call the Suicide and Crisis Lifeline: 988 call the [place]  National Suicide Prevention Lifeline: 9720767007 or TTY: 726-652-8811 TTY 814 416 1100) to talk to a trained counselor call 1-800-273-TALK (toll free, 24 hour hotline) go to [facility] [address], [place] [phone]) call 911 if you are experiencing a Mental Health or Behavioral Health Crisis or need someone to talk to.  The patient verbalized understanding of instructions, educational materials, and care plan provided today and DECLINED offer to receive copy of patient instructions, educational materials, and care plan.   Thersia Hoar, HEDWIG, MHA [place]  [facility] Social Worker, Population Health [phone]

## 2024-04-12 NOTE — Progress Notes (Signed)
 Subjective:  Patient ID: Shelby Jordan, female    DOB: May 14, 1951,  MRN: [id number]  Shelby Jordan presents to clinic today for at risk foot care. 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 Pain interferes with ambulation. Aggravating factors include wearing enclosed shoe gear. Pain is relieved with periodic professional debridement. She is accompanied by her daughter on today's visit. Patient relates pain along remnant of left 5th digit and along surgical scar lateral aspect of left foot. Chief Complaint  Patient presents with   RFC     RFC Non diabetic toenail trim. LOV with PCP 02/11/24   New problem(s): None.   PCP is Jimmy Charlie FERNS, MD.  Allergies  Allergen Reactions   Olanzapine  Other (See Comments)    Parkinson-like symptoms   Tomato (Diagnostic) Other (See Comments)    Tongue break out.    Citrus Rash    Review of Systems: Negative except as noted in the HPI.  Objective: No changes noted in today's physical examination. There were no vitals filed for this visit. Shelby Jordan is a pleasant 73 y.o. female in NAD. AAO x 3.  Vascular Examination: Capillary refill time immediate b/l. Vascular status intact b/l with palpable DP pulses; faintly palpable PT pulses. Pedal hair absent b/l. No edema. No pain with calf compression b/l. Skin temperature gradient WNL b/l. No cyanosis or clubbing noted b/l. No ischemia or gangrene b/l.   Neurological Examination: Sensation grossly intact b/l with 10 gram monofilament. Vibratory sensation intact b/l.   Dermatological Examination: Pedal skin with normal turgor, texture and tone b/l.  No open wounds. No interdigital macerations.   Toenails 1-5 right, 1-4 left thick, discolored, elongated with subungual debris and pain on dorsal palpation.   Hyperkeratotic lesion(s) lateral aspect of surgical scar left foot.  No erythema, no edema, no drainage, no  fluctuance.  Musculoskeletal Examination: Muscle strength 5/5 to all lower extremity muscle groups bilaterally. Lower extremity amputation(s): 5th ray amputation left foot.  Radiographs: None  Last A1c:      Latest Ref Rng & Units 10/08/2023    1:10 PM 04/15/2023   12:52 PM  Hemoglobin A1C  Hemoglobin-A1c 4.6 - 6.5 % 5.8  6.1    Assessment/Plan: 1. Pain due to onychomycosis of toenails of both feet   2. Callus   3. 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 All patient's and/or POA's questions/concerns addressed on today's visit. Toenails 1-5 right foot, 1-4 left foot debrided in length and girth without incident. Callus(es) left surgical scar left foot pared with sharp debridement without incident. Applied Biatain Silicone dressing with instructions/packaging for daughter to purchase from Consulting civil [employer] or [employer]. Continue soft, supportive shoe gear daily. Report any pedal injuries to medical professional. Call office if there are any questions/concerns.  Return in about 4 months (around 08/10/2024).  Delon LITTIE Merlin, DPM      [facility] LOCATION: [address], [place] [address]                   Office [phone]   [facility] LOCATION: [address] [place], [place] [address]  Office [phone]

## 2024-04-13 ENCOUNTER — Ambulatory Visit

## 2024-04-13 VITALS — BP 98/60 | HR 63 | Temp 98.6°F | Ht 64.0 in | Wt 198.0 lb

## 2024-04-13 DIAGNOSIS — M85852 Other specified disorders of bone density and structure, left thigh: Secondary | ICD-10-CM | POA: Diagnosis not present

## 2024-04-13 DIAGNOSIS — N1832 Chronic kidney disease, stage 3b: Secondary | ICD-10-CM

## 2024-04-13 DIAGNOSIS — G40909 Epilepsy, unspecified, not intractable, without status epilepticus: Secondary | ICD-10-CM | POA: Diagnosis not present

## 2024-04-13 DIAGNOSIS — I959 Hypotension, unspecified: Secondary | ICD-10-CM | POA: Diagnosis not present

## 2024-04-13 DIAGNOSIS — E782 Mixed hyperlipidemia: Secondary | ICD-10-CM

## 2024-04-13 MED ORDER — CALCIUM CARB-CHOLECALCIFEROL 600-10 MG-MCG PO TABS: 2.0000 | ORAL_TABLET | Freq: Every day | ORAL | 0 refills | Status: AC

## 2024-04-13 MED ORDER — BLOOD PRESSURE MONITOR AUTOMAT DEVI: 1.0000 | Freq: Every day | 0 refills | Status: AC

## 2024-04-13 NOTE — Patient Instructions (Addendum)
 Thank you for visiting [facility] today! Here's what we talked about: - Calcium  1200mg  a day and Vit D3 - 1000mg , as close to these as possible - Bone scan people will call you - Fasting cholesterol lab when you come back - Check BP daily for 7 days and send to me.  - Low BP is if less than 100/60

## 2024-04-13 NOTE — Progress Notes (Addendum)
 Subjective:    Patient ID: Shelby Jordan, female    DOB: [birth year], 73 y.o.   MRN: [id number]   Kellee Sittner is a very pleasant 73 y.o. female who presents today for follow-up of chronic conditions. She is here with her daughter who is her primary caregiver, daughter says she has an aide who comes to the house every evening to help take care of her.  Patient and her daughter deny any difficulties with toileting, feeding, self-care, although daughter does say she helps her to bathe.  Daughter also manages all her medications.  They said they do not want a blood pressure cuff to be able to monitor her BP because it has been low lately. Stopped Aricept, has f/u in 30days with psychiatrist. Patient has no acute complaints today.   Review of Systems  All other systems reviewed and are negative.       Allergies  Allergen Reactions   Olanzapine  Other (See Comments)    Parkinson-like symptoms   Tomato (Diagnostic) Other (See Comments)    Tongue break out.    Citrus Rash    Current Outpatient Medications on File Prior to Visit  Medication Sig Dispense Refill   albuterol  (VENTOLIN  HFA) 108 (90 Base) MCG/ACT inhaler Inhale 2 puffs into the lungs every 4 (four) hours as needed for shortness of breath. 18 g 1   amLODipine  (NORVASC ) 5 MG tablet TAKE 1 TABLET (5 MG TOTAL) BY MOUTH DAILY. 90 tablet 3   ammonium lactate  (AMLACTIN DAILY) 12 % lotion Apply 1 Application topically as needed. 400 g 0   diazePAM , 20 MG Dose, 2 x 10 MG/0.1ML LQPK Place 20 mg into the nose as needed (seizure lasting more than 2 mins). 5 each 3   divalproex  (DEPAKOTE ) 500 MG DR tablet Take 1 tablet (500 mg total) by mouth every 12 (twelve) hours. 180 tablet 0   fluticasone -salmeterol (WIXELA INHUB ) 100-50 MCG/ACT AEPB Inhale 1 puff into the lungs 2 (two) times daily. 60 each 1   metoprolol  succinate (TOPROL -XL) 50 MG 24 hr tablet TAKE 1 TABLET BY MOUTH EVERY DAY 90 tablet 0   mirtazapine  (REMERON ) 30 MG tablet Take  30 mg by mouth at bedtime. Daughter states patient psychiatrist advised patient to take 15 mg per day.  Daughter states she will call psychiatry office to clarify dosage.     Multiple Vitamins-Minerals (MULTIVITAMIN WOMEN 50+) TABS Take 1 tablet by mouth daily. Centrum Silver     traZODone  (DESYREL ) 50 MG tablet Take 50 mg by mouth at bedtime.     No current facility-administered medications on file prior to visit.    BP 98/60 (BP Location: Right Arm, Patient Position: Sitting, Cuff Size: Large)   Pulse 63   Temp 98.6 F (37 C) (Oral)   Ht 5' 4 (1.626 m)   Wt 198 lb (89.8 kg)   SpO2 96%   BMI 33.99 kg/m   Objective:    Physical Exam Vitals and nursing note reviewed.  Constitutional:      Appearance: Normal appearance.  HENT:     Head: Normocephalic and atraumatic.  Eyes:     Extraocular Movements: Extraocular movements intact.     Conjunctiva/sclera: Conjunctivae normal.  Skin:    General: Skin is warm.  Neurological:     Mental Status: She is alert.  Psychiatric:        Mood and Affect: Mood normal.        Behavior: Behavior normal.  Assessment & Plan:   1. Osteopenia of left hip (Primary) Per chart review, DEXA scan from 2023 confirmed osteopenia of the left femoral neck, however FRAX score did not reveal elevated fracture risk so no bisphosphonate is indicated.  However, will order vitamin D  level and start calcium -vitamin D  supplement, further ordered repeat DEXA scan since patient is due. Counseled patient about the importance of increasing intake of dairy, patient handout provided. Further counseled patient about the importance of weightbearing exercises and improving bone density such as wall Pilates or walking on an incline, daughter is agreeable to help her do this.  - DG Bone Density; Future - Calcium  Carb-Cholecalciferol  (CALCIUM  + VITAMIN D3) 600-10 MG-MCG TABS; Take 2 tablets by mouth daily.  Dispense: 180 tablet; Refill: 0 -Vitamin D  level  2.  Mixed hyperlipidemia Per chart review, lipid panel from 2023 did show elevated cholesterol, this may be exacerbated by patient's history of having been on antipsychotics for bipolar, will repeat for more recent measurement, next steps TBD based on this.  - Lipid Panel; Future  3. Seizure disorder [facility]) Per chart review, patient does follow with a neurologist, and was recently seen, plan is for her to undergo EEG to determine whether dose adjustment in valproic  acid level is necessary.  No complaint of breakthrough seizures.  Symptoms well-controlled, continue regimen as below.  - Valproic  acid level - Continue Depakote  500 mg twice daily  4.  Stage IIIb chronic kidney disease  Renal function panel from a few months ago showed CR/GFR of 1.05/56, CBC from a few months ago was WNL, will order labs below for monitoring  - CBC - Renal function panel  5. Hypotension Patient is slightly hypotensive this visit, BP at 98/60, reportedly asymptomatic.  Counseled daughter to check blood pressure at home for the next few days and send me readings, if persistently less than 100/60 would consider de-escalating amlodipine  dose.  Return in about 2 weeks (around 04/27/2024) for Lab review and plan for CPE.   Maddalyn Lutze K Elfa Wooton, MD  04/13/24

## 2024-04-14 LAB — RENAL FUNCTION PANEL
Albumin: 4.5 g/dL (ref 3.5–5.2)
BUN: 22 mg/dL (ref 6–23)
CO2: 31 meq/L (ref 19–32)
Calcium: 10 mg/dL (ref 8.4–10.5)
Chloride: 103 meq/L (ref 96–112)
Creatinine, Ser: 1.46 mg/dL — ABNORMAL HIGH (ref 0.40–1.20)
GFR: 35.67 mL/min — ABNORMAL LOW (ref >60.00)
Glucose, Bld: 91 mg/dL (ref 70–99)
Phosphorus: 3 mg/dL (ref 2.3–4.6)
Potassium: 4.6 meq/L (ref 3.5–5.1)
Sodium: 140 meq/L (ref 135–145)

## 2024-04-14 LAB — CBC
HCT: 40 % (ref 36.0–46.0)
Hemoglobin: 13.2 g/dL (ref 12.0–15.0)
MCHC: 33 g/dL (ref 30.0–36.0)
MCV: 90.4 fl (ref 78.0–100.0)
Platelets: 210 K/uL (ref 150.0–400.0)
RBC: 4.43 Mil/uL (ref 3.87–5.11)
RDW: 15.2 % (ref 11.5–15.5)
WBC: 5.1 K/uL (ref 4.0–10.5)

## 2024-04-14 LAB — VALPROIC ACID LEVEL: Valproic Acid Lvl: 104.7 mg/L — ABNORMAL HIGH (ref 50.0–100.0)

## 2024-04-15 MED ORDER — DEPEND ADJUSTABLE UNDERWEAR LG MISC: 1.0000 | Freq: Every day | 5 refills | Status: AC

## 2024-04-15 NOTE — Addendum Note (Signed)
 Addended by: Narvel Kozub K on: 04/15/2024 05:55 PM   Modules accepted: Orders

## 2024-04-17 ENCOUNTER — Encounter: Admitting: Gastroenterology

## 2024-04-17 ENCOUNTER — Telehealth: Payer: Self-pay | Admitting: Gastroenterology

## 2024-04-17 NOTE — Telephone Encounter (Signed)
 This patient has had numerous no shows cancellations with us  in the past at the [facility] for colonoscopy.  She is not allowed to direct book any further procedures in the [facility] given this occurrence.  She will need to be seen in the office first - if she no shows that visit she will be discharged from our practice. I don't think she has even been seen here for a visit yet.

## 2024-04-17 NOTE — Telephone Encounter (Signed)
 Hi Dr Leigh,   I called patient, spoke with daughter, she stated pt would not come for procedure today and she was unable to prep. Patient's daughter stated she will reschedule. I will NO SHOW patient for today's procedure.   Thank you

## 2024-04-18 ENCOUNTER — Ambulatory Visit: Payer: Self-pay

## 2024-04-18 NOTE — Telephone Encounter (Signed)
 FYI Only or Action Required?: FYI only for provider.  Patient was last seen in primary care on 04/13/2024 by Shelby Reuben POUR, MD.  Called Nurse Triage reporting Headache.  Symptoms began today.  Interventions attempted: OTC medications: sudafed.  Symptoms are: gradually improving.  Triage Disposition: Home Care  Patient/caregiver understands and will follow disposition?: Yes FYI Only or Action Required?: FYI only for provider.  Patient was last seen in primary care on 04/13/2024 by Shelby Reuben POUR, MD.  Called Nurse Triage reporting Headache.  Symptoms began yesterday.  Interventions attempted: OTC medications: sudafed.  Symptoms are: gradually improving.  Triage Disposition: Home Care  Patient/caregiver understands and will follow disposition?: Yes Copied from CRM #[id number]. Topic: Clinical - Medical Advice >> Apr 18, 2024 12:50 PM Thersia C wrote: Reason for CRM: Patient called in wanted to know if its okay if she could take ibuprofen  800 for a headache, also the calcium  she is taking is [employer]  1200 MG , Plus 25 MG vitamin b3 Reason for Disposition  [1] Headache AND [2] part of viral illness AND [3] present < 72 hours  Answer Assessment - Initial Assessment Questions 1. LOCATION: Where does it hurt?      Daughter unsure of where  2. ONSET: When did the headache start? (e.g., minutes, hours, days)      Started today, when she woke up this morning she had a headache  3. PATTERN: Does the pain come and go, or has it been constant since it started?     Comes and goes, getting better  4. SEVERITY: How bad is the pain? and What does it keep you from doing?  (e.g., Scale 1-10; mild, moderate, or severe)     *No Answer* 5. RECURRENT SYMPTOM: Have you ever had headaches before? If Yes, ask: When was the last time? and What happened that time?      No  6. CAUSE: What do you think is causing the headache?     Thinks itt may be due to lack of sleep  7.  MIGRAINE: Have you been diagnosed with migraine headaches? If Yes, ask: Is this headache similar?      No  8. HEAD INJURY: Has there been any recent injury to your head?      *No Answer* 9. OTHER SYMPTOMS: Do you have any other symptoms? (e.g., fever, stiff neck, eye pain, sore throat, cold symptoms)     Nose running and cough that started yesterday  10. PREGNANCY: Is there any chance you are pregnant? When was your last menstrual period?       *No Answer*  Protocols used: Headache-A-AH

## 2024-04-18 NOTE — Telephone Encounter (Signed)
 Spoke to pt's daughter. Said she is sleeping now. She did not understand how to use the BP monitor.

## 2024-04-18 NOTE — Telephone Encounter (Signed)
 Called and spoke to pt's daughter, Dama. Asked if she has checked her BP. She did get the monitor from the pharmacy but has not used it, yet. HR 63 94% oxygen. While on the phone, pt said she was feeling a little better. She has eaten and drank. Daughter is going to try and figure out the monitor and I will call her back in a few mins.

## 2024-04-19 ENCOUNTER — Telehealth: Payer: Self-pay

## 2024-04-19 NOTE — Telephone Encounter (Signed)
 Left message on VM per DPR that she needs to be seen somewhere to be evaluated. Dr Donnalee does nort have anything today, neither do any providers here in the office today. But, our scheduling agents can help them look for an appointment at another clinic today. Asked her to call us  back.

## 2024-04-19 NOTE — Telephone Encounter (Signed)
 Daughter called in and stated that pt wasn't feeling good lump on stomach and stomach swollen I offer pt appt with another provider pt daughter stated pt only wants to see Bowa I went and spoke with Bowa came back to phone and daughter hung up I called daughter back no answer

## 2024-04-19 NOTE — Telephone Encounter (Signed)
 Please call patient and advise how to use an automatic BP monitor,, she is at risk for hypotension so I'd like some readings from them after a few days

## 2024-04-19 NOTE — Patient Instructions (Signed)
 Verdis Twyford - I am sorry I was unable to reach you today for our scheduled appointment. I work with Bennett Reuben POUR, MD and am calling to support your healthcare needs. Please contact me at [phone] at your earliest convenience. I look forward to speaking with you soon.   Thank you,  Nestora Duos, MSN, RN [facility]  [facility], [facility] RN Care Manager Direct Dial: [phone] Fax: [phone]

## 2024-04-19 NOTE — Telephone Encounter (Addendum)
 Spoke to pt's daughter per DPR. She said she is resting right now. When she wakes up, if she is still not doing well, they may go to the ER. She said she would let us  know. I told her that any provider would be well equipped to take care of her if needed.

## 2024-04-19 NOTE — Telephone Encounter (Signed)
    04/19/24 10:49 AM Nicholaus Pump routed this conversation to [facility] (Selected Message) Nicholaus Pump TD   04/19/24 10:49 AM Note Daughter called in and stated that pt wasn't feeling good lump on stomach and stomach swollen I offer pt appt with another provider pt daughter stated pt only wants to see Bowa I went and spoke with Bowa came back to phone and daughter hung up I called daughter back no answer

## 2024-04-19 NOTE — Telephone Encounter (Signed)
 I copied this information and pasted it in the message that was already open about this.

## 2024-04-19 NOTE — Telephone Encounter (Signed)
 Copied from CRM [id number]. Topic: Clinical - Refused Triage >> Apr 19, 2024  9:41 AM Henretta I wrote: Patient/caller voiced complaints of new lump on side that is a little painful, slight temp, and stomach was bloated . Declined transfer to triage. Patients daughter called in [place]  on behalf of mom, she wants to know if she can be popped on schedule with Dr. Bennett as quick as possible as appts aren't open till 23rd and she is only wanting to stick with Dr. Bennett. Call back number is [phone]

## 2024-04-20 NOTE — Telephone Encounter (Signed)
 Spoke to daughter. She said she has not figured out how to use the monitor but states pt is feeling better. She does not feel dizzy. I explained the concern about hypotension. I asked her could she come to the office in the next few days so I can show her how to use the monitor. She said she could try to come Monday (10-6).

## 2024-04-20 NOTE — Telephone Encounter (Signed)
 I tried to explain it to her the other day while we were talking. The last thing she said was the cord for the monitor was not long enough to reach the patient so she would have to move her to check it. I asked if it had battery capabilities and she said she did not think so. I did say that if she could get to the office, I would show her how to work it. Also asked if there was someone else who could possibly help her.   I will call the daughter, again, to see if she was able to figure it out.

## 2024-04-21 DIAGNOSIS — Z8669 Personal history of other diseases of the nervous system and sense organs: Secondary | ICD-10-CM | POA: Diagnosis not present

## 2024-04-21 DIAGNOSIS — R9401 Abnormal electroencephalogram [EEG]: Secondary | ICD-10-CM

## 2024-04-21 DIAGNOSIS — G40211 Localization-related (focal) (partial) symptomatic epilepsy and epileptic syndromes with complex partial seizures, intractable, with status epilepticus: Secondary | ICD-10-CM | POA: Diagnosis not present

## 2024-04-21 DIAGNOSIS — G40901 Epilepsy, unspecified, not intractable, with status epilepticus: Secondary | ICD-10-CM

## 2024-04-25 ENCOUNTER — Other Ambulatory Visit: Payer: Self-pay

## 2024-04-25 NOTE — Patient Instructions (Signed)
 Visit Information  Thank you for taking time to visit with me today. Please don't hesitate to contact me if I can be of assistance to you before our next scheduled appointment.  Your next care management appointment is by telephone on 05/19/24 at 1130   Please call the care guide team at [phone] if you need to cancel, schedule, or reschedule an appointment.   Please call the Suicide and Crisis Lifeline: 988 call the [place]  National Suicide Prevention Lifeline: (431) 244-8874 or TTY: (240) 029-6224 TTY (845) 175-6800) to talk to a trained counselor call 1-800-273-TALK (toll free, 24 hour hotline) call 911 if you are experiencing a Mental Health or Behavioral Health Crisis or need someone to talk to.  Thersia Hoar, HEDWIG, MHA [place]  [facility] Social Worker, Population Health [phone]

## 2024-04-25 NOTE — Progress Notes (Signed)
 Apparently daughter already has BP cuff, just has been struggling to learn how to use it. I did send Depends underwear to the pharmacy, can you confirm with the pt if they picked up?

## 2024-04-27 ENCOUNTER — Other Ambulatory Visit (INDEPENDENT_AMBULATORY_CARE_PROVIDER_SITE_OTHER)

## 2024-04-27 DIAGNOSIS — E782 Mixed hyperlipidemia: Secondary | ICD-10-CM

## 2024-04-27 DIAGNOSIS — M85852 Other specified disorders of bone density and structure, left thigh: Secondary | ICD-10-CM | POA: Diagnosis not present

## 2024-04-27 LAB — LIPID PANEL
Cholesterol: 206 mg/dL — ABNORMAL HIGH (ref 0–200)
HDL: 69 mg/dL (ref >39.00)
LDL Cholesterol: 119 mg/dL — ABNORMAL HIGH (ref 0–99)
NonHDL: 137.21
Total CHOL/HDL Ratio: 3
Triglycerides: 90 mg/dL (ref 0.0–149.0)
VLDL: 18 mg/dL (ref 0.0–40.0)

## 2024-04-27 LAB — VITAMIN D 25 HYDROXY (VIT D DEFICIENCY, FRACTURES): VITD: 40.9 ng/mL (ref 30.00–100.00)

## 2024-04-28 ENCOUNTER — Ambulatory Visit: Payer: Self-pay

## 2024-04-28 DIAGNOSIS — E782 Mixed hyperlipidemia: Secondary | ICD-10-CM

## 2024-05-01 NOTE — Telephone Encounter (Signed)
 Copied from CRM [id number]. Topic: Clinical - Medication Question >> May 01, 2024  2:29 PM Mesmerise C wrote: Reason for CRM: Patient's daughter checking about a medication for cholesterol that was supposed to be sent when given test results but nothing was sent to the pharmacy and also inquiring about the appointment if she needs a follow up she would like a call back

## 2024-05-02 MED ORDER — ATORVASTATIN CALCIUM 40 MG PO TABS: 40.0000 mg | ORAL_TABLET | Freq: Every day | ORAL | 3 refills | Status: DC

## 2024-05-02 NOTE — Telephone Encounter (Signed)
 Please call patient and advise that cholesterol medication has been sent to the pharmacy.  Please also advise that she does need a follow-up visit with me, yes 40-minute slot is needed.

## 2024-05-03 DIAGNOSIS — F17210 Nicotine dependence, cigarettes, uncomplicated: Secondary | ICD-10-CM | POA: Diagnosis not present

## 2024-05-05 ENCOUNTER — Encounter (INDEPENDENT_AMBULATORY_CARE_PROVIDER_SITE_OTHER): Admitting: Neurology

## 2024-05-05 DIAGNOSIS — G40901 Epilepsy, unspecified, not intractable, with status epilepticus: Secondary | ICD-10-CM

## 2024-05-05 NOTE — Procedures (Signed)
 Patient Name: Shelby Jordan  MRN: [id number]  Referring Physician/Provider: Gregg    Study start date: 04/18/2024 at 1251 PM Study end date: 103/2025 at 0158 PM Duration: 73 hours    Clinical History:   40Y Female PMH of Hepatitis C, Hypertension, UTI, Schizoaffective disorder, Bipolar 1 disorder, seizure disorder, anxiety, and depression who is presenting after a seizure on January 18,2025. Per daughter, she had fallen and hit her head the day prior. The following day she was found on the floor w/ unexplained fall and taken to ED. Once home she was found again in the closet w/unexplained fall and taken to ED where it was said she had a generalized seizure. Patient takes Depakote  for bipolar disorder and has been out of medication for 1.5 months. 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   INTERMITTENT MONITORING with VIDEO TECHNICAL SUMMARY:  This AVEEG was performed using equipment provided by [employer] utilizing Bluetooth ( [employer] ) amplifiers with continuous EEGT attended video collection using encrypted remote transmission via [employer] secured cellular tower network with data rates for each AVEEG performed. This is a Therapist, music AVEEG, obtained, according to the 10-20 international electrode placement system, reformatted digitally into referential and bipolar montages. Data was acquired with a minimum of 21 bipolar connections and sampled at a minimum rate of 250 cycles per second per channel, maximum rate of 450 cycles per second per channel and two channels for EKG. The entire VEEG study was recorded through cable and or radio telemetry for subsequent analysis. Specified epochs of the AVEEG data were identified at the direction of the subject by the depression of a push button by the patient. Each patients event file included data acquired two minutes prior to the push button activation and continuing until two minutes  afterwards. AVEEG files were reviewed on [employer] server, Licensed Software provided by [employer] with a digital high frequency filter set at 70 Hz and a low frequency filter set at 1 Hz with a paper speed of 35mm/s resulting in 10 seconds per digital page. This entire AVEEG was reviewed by the EEG Technologist. Random time samples, random sleep samples, clips, patient initiated push button files with included patient daily diary logs, EEG Technologist pruned data was reviewed and verified for accuracy and validity by the governing reading neurologist in full details. This AEEGV was fully compliant with all requirements for CPT 97500 for setup, patient education, take down and administered by an EEG technologist.   Long-Term EEG with Video was monitored intermittently by a qualified EEG technologist for the entirety of the recording; quality check-ins were performed at a minimum of every two hours, checking and documenting real-time data and video to assure the integrity and quality of the recording (e.g., camera position, electrode integrity and impedance), and identify the need for maintenance. For intermittent monitoring, an EEG Technologist monitored no more than 12 patients concurrently. Diagnostic video was captured at least 80% of the time during the recording.   PATIENT EVENTS:  A button press or notation was not made during this study.   TECHNOLOGIST EVENTS:  No clear epileptiform activity was detected by the reviewing neurodiagnostic technologist for further review.   TIME SAMPLES:  10-minutes of every two hours recorded are reviewed as random time samples.   SLEEP SAMPLES:  5-minutes of every 24 hour recorded sleep cycle are reviewed as random sleep samples.   AWAKE:  At maximal level of alertness, the posterior dominant  background activity was continuous, reactive, low voltage rhythm of 8.5 Hz at best. This was symmetric, well-modulated, and attenuated with eye opening.  Diffuse, symmetric, frontocentral beta range activity was present.   SLEEP:  N1 Sleep (Stage 1) was observed and characterized by the disappearance of alpha rhythm and the appearance of vertex activity.   N2 Sleep (Stage 2) was observed and characterized by vertex waves, K-complexes, and sleep spindles.   N3 (Stage 3) sleep was observed and characterized by high amplitude Delta activity of 20%.   REM sleep was observed.   EKG:  There were no arrhythmias or abnormalities noted during this recording.   Impression:  Normal EEG: awake and asleep   Clinical Correlation:  This is a normal 3-day ambulatory EEG tracing. No focal abnormalities or epileptiform discharges were seen. There were no electrographic seizures noted. No events were captured during the recording. Please note a normal EEG does not exclude the diagnosis of epilepsy.   Lakeena Downie, MD [facility]

## 2024-05-08 ENCOUNTER — Telehealth: Payer: Self-pay | Admitting: Neurology

## 2024-05-08 ENCOUNTER — Ambulatory Visit (INDEPENDENT_AMBULATORY_CARE_PROVIDER_SITE_OTHER): Admitting: Urology

## 2024-05-08 VITALS — BP 111/70 | HR 56 | Ht 64.0 in | Wt 198.6 lb

## 2024-05-08 DIAGNOSIS — N3941 Urge incontinence: Secondary | ICD-10-CM

## 2024-05-08 NOTE — Telephone Encounter (Signed)
-----   Message from [facility] sent at 05/08/2024  9:16 AM EDT ----- Please and inform patient that her recent ambulatory EEG (Brain wave test) was normal. In particular, there were no epileptiform discharges and no seizures. No further action is required on this test  at this time. Please keep any upcoming appointments or tests and  call us  with any interim questions, concerns, problems or updates. Thanks,   Pastor Falling, MD  ----- Message ----- From: Camara, Amadou, MD Sent: 05/05/2024  12:39 PM EDT To: Pastor Falling, MD

## 2024-05-08 NOTE — Progress Notes (Signed)
 05/08/2024 1:54 PM   Shelby Jordan 08/06/1950 [id number]  Referring provider: Corwin Antu, FNP [address] [place],  [place] [address]  No chief complaint on file.   HPI: I was consulted to assess the patient's urinary incontinence.  She has urge incontinence.  She says she has no stress incontinence.  She has moderately severe bedwetting.  She does not wear pads   She voids every hour and cannot hold it for 2 hours.  She gets up 5 times at night.  Does not take a diuretic.  No ankle edema   No hysterectomy   History was a little bit challenging.  I reviewed the medical records and she has had a recent renal ultrasound and negative urine culture     Has urgency incontinence bedwetting and significant nocturia reassess in 6 weeks with examination and cystoscopy on Gemtesa  samples and prescription. She may or may not need urodynamics in the future. Get urine culture next time. Check postvoid residual next time    Today Frequency improved.  Much less bedwetting but still some.  Less urge incontinence.  Clinically not infected Pelvic examination she had grade 1 hypermobility the bladder neck and negative cough test with a moderate cough after cystoscopy Cystoscopy: Patient underwent flexible cystoscopy.  Bladder mucosa and trigone were normal.  No cystitis.  No carcinoma.  Urine aspirated and sent for culture     Patient doing moderately better on Gemtesa .  Recheck in 2 months with a postvoid residual and proceed accordingly.  Call if urine culture positive    Today Frequency stable.  Last culture negative.  Culture prior to that was normal as well recent CT scan normal performed January 2025.  Patient has bipolar illness and her daughter is not certain if she has the toileting skills or she is lazy etc.  It is always a bit difficult to communicate or get a clear picture.  She did not leave a urine sample today    Reassess in 6 weeks on Myrbetriq  50 mg.  I think we have to  have reasonable treatment goals.  Daughter want to know neck step and would be percutaneous tibial nerve stimulation.  I am not going to order urodynamics.  Today Urge incontinence persisting.  Frequency persisting.  Still has bedwetting.  Daughter wants to proceed with testing    PMH: Past Medical History:  Diagnosis Date   Bipolar 1 disorder (HCC)    Depression    Hepatitis C    High cholesterol    Hypertension    Seizures (HCC)    UTI (urinary tract infection)     Surgical History: Past Surgical History:  Procedure Laterality Date   APPENDECTOMY     BREAST BIOPSY Right 03/04/2022   stereo bx, mass X clip-path pending   TOE AMPUTATION      Home Medications:  Allergies as of 05/08/2024       Reactions   Olanzapine  Other (See Comments)   Parkinson-like symptoms   Tomato (diagnostic) Other (See Comments)   Tongue break out.    Citrus Rash        Medication List        Accurate as of May 08, 2024  1:54 PM. If you have any questions, ask your nurse or doctor.          albuterol  108 (90 Base) MCG/ACT inhaler Commonly known as: VENTOLIN  HFA Inhale 2 puffs into the lungs every 4 (four) hours as needed for shortness of  breath.   amLODipine  5 MG tablet Commonly known as: NORVASC  TAKE 1 TABLET (5 MG TOTAL) BY MOUTH DAILY.   ammonium lactate  12 % lotion Commonly known as: Amlactin Daily Apply 1 Application topically as needed.   atorvastatin  40 MG tablet Commonly known as: LIPITOR Take 1 tablet (40 mg total) by mouth daily.   Blood Pressure Monitor Automat Devi 1 each by Does not apply route daily.   Calcium  Carb-Cholecalciferol  600-10 MG-MCG Tabs Commonly known as: Calcium  + Vitamin D3 Take 2 tablets by mouth daily.   Depend Adjustable Underwear Lg Misc 1 each by Does not apply route daily.   divalproex  500 MG DR tablet Commonly known as: DEPAKOTE  Take 1 tablet (500 mg total) by mouth every 12 (twelve) hours.   fluticasone -salmeterol  100-50 MCG/ACT Aepb Commonly known as: Wixela Inhub  Inhale 1 puff into the lungs 2 (two) times daily.   metoprolol  succinate 50 MG 24 hr tablet Commonly known as: TOPROL -XL TAKE 1 TABLET BY MOUTH EVERY DAY   mirtazapine  30 MG tablet Commonly known as: REMERON  Take 30 mg by mouth at bedtime. Daughter states patient psychiatrist advised patient to take 15 mg per day.  Daughter states she will call psychiatry office to clarify dosage.   Multivitamin Women 50+ Tabs Take 1 tablet by mouth daily. Centrum Silver   traZODone  50 MG tablet Commonly known as: DESYREL  Take 50 mg by mouth at bedtime.   Valtoco  20 MG Dose 2 x 10 MG/0.1ML Lqpk Generic drug: diazePAM  (20 MG Dose) Place 20 mg into the nose as needed (seizure lasting more than 2 mins).        Allergies:  Allergies  Allergen Reactions   Olanzapine  Other (See Comments)    Parkinson-like symptoms   Tomato (Diagnostic) Other (See Comments)    Tongue break out.    Citrus Rash    Family History: Family History  Problem Relation Age of Onset   Mental illness Mother        born in mental hospital   Breast cancer Neg Hx    Colon cancer Neg Hx    Rectal cancer Neg Hx    Stomach cancer Neg Hx     Social History:  reports that she has been smoking cigarettes. She started smoking about 32 years ago. She has a 30 pack-year smoking history. She has been exposed to tobacco smoke. She has never used smokeless tobacco. She reports that she does not drink alcohol and does not use drugs.  ROS:                                        Physical Exam: There were no vitals taken for this visit.  Constitutional:  Alert and oriented, No acute distress. HEENT: [place] AT, moist mucus membranes.  Trachea midline, no masses.  Laboratory Data: Lab Results  Component Value Date   WBC 5.1 04/13/2024   HGB 13.2 04/13/2024   HCT 40.0 04/13/2024   MCV 90.4 04/13/2024   PLT 210.0 04/13/2024    Lab Results  Component  Value Date   CREATININE 1.46 (H) 04/13/2024    No results found for: PSA  No results found for: TESTOSTERONE  Lab Results  Component Value Date   HGBA1C 5.8 10/08/2023    Urinalysis    Component Value Date/Time   COLORURINE YELLOW 01/30/2024 2153   APPEARANCEUR CLEAR 01/30/2024 2153   APPEARANCEUR Clear 06/07/2023  1335   LABSPEC 1.017 01/30/2024 2153   PHURINE 6.0 01/30/2024 2153   GLUCOSEU NEGATIVE 01/30/2024 2153   GLUCOSEU NEGATIVE 07/16/2022 1459   HGBUR NEGATIVE 01/30/2024 2153   BILIRUBINUR NEGATIVE 01/30/2024 2153   BILIRUBINUR Negative 06/07/2023 1335   KETONESUR NEGATIVE 01/30/2024 2153   PROTEINUR NEGATIVE 01/30/2024 2153   UROBILINOGEN 0.2 02/11/2023 1440   UROBILINOGEN 0.2 07/16/2022 1459   NITRITE NEGATIVE 01/30/2024 2153   LEUKOCYTESUR NEGATIVE 01/30/2024 2153    Pertinent Imaging:   Assessment & Plan: Patient will return with urodynamics.  Failed Myrbetriq  and Gemtesa .  1. Urgency incontinence (Primary)  - Urinalysis, Complete   No follow-ups on file.  Glendia DELENA Elizabeth, MD  [facility] [address] [place], [place] [address] [phone]

## 2024-05-08 NOTE — Telephone Encounter (Signed)
 Pt's daughter has returned call to [facility] is asking for a call back on [phone]

## 2024-05-08 NOTE — Telephone Encounter (Signed)
 Lvm 1st attempt by hf

## 2024-05-08 NOTE — Progress Notes (Signed)
 Please and inform patient that her recent ambulatory EEG (Brain wave test) was normal. In particular, there were no epileptiform discharges and no seizures. No further action is required on this test at this time. Please keep any upcoming appointments or tests and  call us  with any interim questions, concerns, problems or updates. Thanks,   Pastor Falling, MD

## 2024-05-08 NOTE — Patient Outreach (Signed)
 RNCM returning call to daughter Shelby Jordan - states will be switching urologists, decided to get cysto appointment and then prefers [facility] due to all doctors being in [place]. RNCM will notify BSW that scripts for BP monitor and incontinence products are in chart as discussed. No further needs at this time.

## 2024-05-08 NOTE — Telephone Encounter (Signed)
Handled via result note

## 2024-05-17 ENCOUNTER — Ambulatory Visit

## 2024-05-17 ENCOUNTER — Ambulatory Visit: Payer: Self-pay

## 2024-05-17 ENCOUNTER — Ambulatory Visit (HOSPITAL_COMMUNITY)
Admission: RE | Admit: 2024-05-17 | Discharge: 2024-05-17 | Disposition: A | Discharge status: Home / Self Care | Source: Ambulatory Visit | Attending: Vascular Surgery | Admitting: Vascular Surgery

## 2024-05-17 VITALS — BP 122/66 | HR 62 | Ht 64.0 in | Wt 201.0 lb

## 2024-05-17 DIAGNOSIS — N1832 Chronic kidney disease, stage 3b: Secondary | ICD-10-CM

## 2024-05-17 DIAGNOSIS — K5900 Constipation, unspecified: Secondary | ICD-10-CM | POA: Diagnosis not present

## 2024-05-17 DIAGNOSIS — M79661 Pain in right lower leg: Secondary | ICD-10-CM | POA: Insufficient documentation

## 2024-05-17 DIAGNOSIS — R630 Anorexia: Secondary | ICD-10-CM

## 2024-05-17 DIAGNOSIS — M7989 Other specified soft tissue disorders: Secondary | ICD-10-CM

## 2024-05-17 DIAGNOSIS — R7303 Prediabetes: Secondary | ICD-10-CM

## 2024-05-17 LAB — BASIC METABOLIC PANEL WITH GFR
BUN: 26 mg/dL — ABNORMAL HIGH (ref 6–23)
CO2: 31 meq/L (ref 19–32)
Calcium: 9.6 mg/dL (ref 8.4–10.5)
Chloride: 101 meq/L (ref 96–112)
Creatinine, Ser: 1.51 mg/dL — ABNORMAL HIGH (ref 0.40–1.20)
GFR: 34.24 mL/min — ABNORMAL LOW (ref >60.00)
Glucose, Bld: 135 mg/dL — ABNORMAL HIGH (ref 70–99)
Potassium: 4.3 meq/L (ref 3.5–5.1)
Sodium: 139 meq/L (ref 135–145)

## 2024-05-17 LAB — HEMOGLOBIN A1C: Hgb A1c MFr Bld: 6.1 % (ref 4.6–6.5)

## 2024-05-17 LAB — CK: Total CK: 42 U/L (ref 17–177)

## 2024-05-17 NOTE — Progress Notes (Signed)
 Subjective:   This visit was conducted in person. The patient gave informed consent to the use of Abridge AI technology to record the contents of the encounter as documented below.   Patient ID: Pama Calvi, female    DOB: 1951-05-25, 73 y.o.   MRN: [id number]   Discussed the use of AI scribe software for clinical note transcription with the patient, who gave verbal consent to proceed.  History of Present Illness Lashanti Chambless is a 73 year old female who presents with multiple subcutaneous lumps, constipation, and difficulty walking.  She has been experiencing multiple subcutaneous lumps for several months, which have been increasing in size. The lumps are located in various areas including the stomach, leg, and chest. They are not red, itchy, or oozing, but one of the lumps has started to hurt. A CT scan was performed approximately six months ago, but it did not highlight any issues with the lumps.  She is experiencing constipation, with bowel movements occurring every other day or less frequently. She has been using apricots, figs, and dates to manage her constipation, but due to her difficulty walking, she is unable to make it to the bathroom in time. She has been using Miralax  to help with bowel movements. No pain during bowel movements.  She has difficulty walking, which began after starting a cholesterol medication. She reports pain in her right leg, which she rates as a 6 out of 10, and swelling in the right leg. She has been using 800 mg ibuprofen  for pain relief, which provides some relief. She has been mostly bedridden for the past two weeks due to the pain and difficulty walking. No pain in the left leg.  She has a history of Parkinson's-like symptoms attributed to medication use, which has affected her coordination and ability to feed herself. She has a reduced appetite and has been eating less, which her caregiver attributes to the medication changes. She is currently on  mirtazapine  for appetite stimulation and trazodone  for sleep.  She has a history of chronic kidney disease, with a filtration rate of 35. She struggles with maintaining adequate hydration, consuming only 8 to 16 ounces of fluid daily.    Review of Systems  All other systems reviewed and are negative.       Allergies  Allergen Reactions   Olanzapine  Other (See Comments)    Parkinson-like symptoms   Tomato (Diagnostic) Other (See Comments)    Tongue break out.    Citrus Rash    Current Outpatient Medications on File Prior to Visit  Medication Sig Dispense Refill   albuterol  (VENTOLIN  HFA) 108 (90 Base) MCG/ACT inhaler Inhale 2 puffs into the lungs every 4 (four) hours as needed for shortness of breath. 18 g 1   amLODipine  (NORVASC ) 5 MG tablet TAKE 1 TABLET (5 MG TOTAL) BY MOUTH DAILY. 90 tablet 3   ammonium lactate  (AMLACTIN DAILY) 12 % lotion Apply 1 Application topically as needed. 400 g 0   Calcium  Carb-Cholecalciferol  (CALCIUM  + VITAMIN D3) 600-10 MG-MCG TABS Take 2 tablets by mouth daily. 180 tablet 0   diazePAM , 20 MG Dose, 2 x 10 MG/0.1ML LQPK Place 20 mg into the nose as needed (seizure lasting more than 2 mins). 5 each 3   divalproex  (DEPAKOTE ) 500 MG DR tablet Take 1 tablet (500 mg total) by mouth every 12 (twelve) hours. 180 tablet 0   fluticasone -salmeterol (WIXELA INHUB ) 100-50 MCG/ACT AEPB Inhale 1 puff into the lungs 2 (two) times daily. 60 each 1  Incontinence Supply Disposable (DEPEND ADJUSTABLE UNDERWEAR LG) MISC 1 each by Does not apply route daily. 32 each 5   metoprolol  succinate (TOPROL -XL) 50 MG 24 hr tablet TAKE 1 TABLET BY MOUTH EVERY DAY 90 tablet 0   mirtazapine  (REMERON ) 30 MG tablet Take 30 mg by mouth at bedtime. Daughter states patient psychiatrist advised patient to take 15 mg per day.  Daughter states she will call psychiatry office to clarify dosage.     Multiple Vitamins-Minerals (MULTIVITAMIN WOMEN 50+) TABS Take 1 tablet by mouth daily. Centrum  Silver     sertraline (ZOLOFT) 50 MG tablet Take 50 mg by mouth daily.     traZODone  (DESYREL ) 50 MG tablet Take 50 mg by mouth at bedtime.     No current facility-administered medications on file prior to visit.    BP 122/66 (BP Location: Right Arm, Patient Position: Sitting, Cuff Size: Large)   Pulse 62   Ht 5' 4 (1.626 m)   Wt 201 lb (91.2 kg)   SpO2 92%   BMI 34.50 kg/m   Objective:      Physical Exam GENERAL: Alert, cooperative, well developed, no acute distress. HEAD: Normocephalic atraumatic. CARDIOVASCULAR: Normal heart rate and rhythm, S1 and S2 normal without murmurs. CHEST: Clear to auscultation bilaterally, no wheezes, rhonchi, or crackles. EXTREMITIES: Right lower calf mildly swollen, mildly tender to palpation. Positive Homan's sign. Right calf 36.5 cm, left calf 35 cm. No cyanosis or edema. NEUROLOGICAL: Oriented to person, place and time, no gait abnormalities, moves all extremities without gross motor or sensory deficit. SKIN: Firm, non-tender subcutaneous nodule on central anterior chest, overlying the upper sternum.       Assessment & Plan:   Assessment & Plan Right calf pain and swelling Suspected DVT due to positive Homan's sign and risk factor of reduced mobility over the last 2 weeks due to the calf pain. Possible statin-induced myopathy.  Reassess mobility in a few weeks, if unimproved, would broaden differential.  - Order stat ultrasound of the right lower leg to rule out DVT. - Hold atorvastatin  pending ultrasound results. - Check CK level to assess for statin-induced myopathy. - Instruct to monitor for chest pain, shortness of breath, or trouble breathing and go to ED if these occur. - Encourage safe ambulation as pain allows.  Soft tissue mass Differential includes lipoma and cysts. Previous CT scan did not highlight these masses. - Order non-urgent ultrasound of the subcutaneous masses to assess for lipoma or  cysts.  Constipation Constipation with infrequent bowel movements, limited by impaired mobility.  - Start daily Miralax  to achieve bowel movements at least every other day. - Adjust Miralax  dosing based on bowel movement frequency, reducing to as needed if daily bowel movements occur or diarrhea develops.  Reduced appetite Reduced appetite possibly related to physiologic stress and inactivity. Currently on mirtazapine .  - Monitor appetite and consider medication options if no improvement. - Encourage increased physical activity as pain allows.  Chronic kidney disease Stage 3b CKD with GFR of 35 as of 17mo ago, possible progression from previous GFR of 53.  - Order repeat kidney function tests to assess for progression, will consider nephro referral. - Encourage adequate hydration.  Prediabetes A1c ordered in prep for upcoming wellness    Return in about 4 weeks (around 06/14/2024) for CPE.   Alida Greiner K Faith Patricelli, MD  05/17/24     Contains text generated by Abridge.

## 2024-05-17 NOTE — Patient Instructions (Addendum)
 Thank you for visiting  Healthcare today! Here's what we talked about: - Start daily miralax  then once bowel movements are daily, just use as needed - [facility] [address] [place], [place], [place] [address]. - Stop Atorvastatin  - Call to schedule bone scan - ED if chest pain, trouble breathing

## 2024-05-19 ENCOUNTER — Other Ambulatory Visit: Payer: Self-pay

## 2024-05-19 NOTE — Patient Outreach (Signed)
 Social Drivers of Health  Community Resource and Care Coordination Visit Note   05/19/2024  Name: Shelby Jordan MRN: [id number] DOB:1951-06-12  Situation: Referral received for [facility] needs assessment and assistance related to Financial Strain  incontinence supplies and assisted living facility. I obtained verbal consent from Caregiver.  Visit completed with POA on the phone.   Background:      Assessment:   Goals Addressed             This Visit's Progress    BSW VBCI Social Work Care Plan       Problems:   Food Insecurity  and incontinence supplies and blood pressure cuff  CSW Clinical Goal(s):   Over the next 30 days the Patient will work with Child Psychotherapist to address concerns related to food, incontinence supplies and blood pressure cuff.  Interventions:  SW will send PCP a message for a script for incontinence supplies and blood pressure cuff. SW will send resources for supplies if Medicaid does not cover.SW mailed resources for [employer]. SW mailed resources for ensures and assisted living facilities  Patient Goals/Self-Care Activities:  Patient and daughter will [employer] send and apply for foodstamps  Plan:   SW will follow up on 06/02/24 at 1145pm         Recommendation:   Patients daughter will follow up with resources of assisted living facilities.  Follow Up Plan:   Telephone follow-up 06/02/24 at 1145  Thersia Hoar, [place], [place] [facility]  Value Based [facility] Social Worker, Population Health [phone]

## 2024-05-19 NOTE — Patient Instructions (Signed)
 Visit Information  Thank you for taking time to visit with me today. Please don't hesitate to contact me if I can be of assistance to you before our next scheduled appointment.  Your next care management appointment is by telephone on 06/02/24 at 1145    Please call the care guide team at [phone] if you need to cancel, schedule, or reschedule an appointment.   Please call the Suicide and Crisis Lifeline: 988 call the [place]  National Suicide Prevention Lifeline: (929) 814-5851 or TTY: 440-079-7147 TTY 306-844-0938) to talk to a trained counselor call 1-800-273-TALK (toll free, 24 hour hotline) go to [facility] [address] [place] [phone]) call 911 if you are experiencing a Mental Health or Behavioral Health Crisis or need someone to talk to.  Thersia Hoar, [place], MHA [place]  [facility] Social Worker, Population Health [phone]

## 2024-05-22 ENCOUNTER — Telehealth: Payer: Self-pay

## 2024-05-22 NOTE — Telephone Encounter (Signed)
 As of a few mins ago, there was nothing in Dr Charlyn S-Drive for this.

## 2024-05-22 NOTE — Telephone Encounter (Signed)
 Copied from CRM #[id number]. Topic: Clinical - Order For Equipment >> May 22, 2024  1:19 PM Alfonso HERO wrote: Reason for CRM: patients daughter calling stating [employer] faxed paperwork over for patients incontinence supplies. She was told it was sent to someone named Amy. She just wanted to make sure the paperwork was received.

## 2024-05-23 NOTE — Telephone Encounter (Signed)
 Received a fax from [employer]. They need OV Notes that state her incontinence, inability to hold bladder. The fax was placed in Dr Charlyn inbox on her desk.

## 2024-05-25 ENCOUNTER — Ambulatory Visit (HOSPITAL_COMMUNITY)
Admission: RE | Admit: 2024-05-25 | Discharge: 2024-05-25 | Disposition: A | Discharge status: Home / Self Care | Source: Ambulatory Visit

## 2024-05-25 DIAGNOSIS — M7989 Other specified soft tissue disorders: Secondary | ICD-10-CM | POA: Insufficient documentation

## 2024-05-25 NOTE — Telephone Encounter (Signed)
 Spoke to daughter, Dama, to let her know we have faxed what they were wanting.

## 2024-05-26 NOTE — Telephone Encounter (Signed)
 Copied from CRM [id number]. Topic: General - Other >> May 26, 2024  4:26 PM Aisha D wrote: Reason for CRM: Sarah with [employer] stated that the pt's daughter called and informed them that the office faxed over the requested paperwork for the incontinence supplies. Lauraine stated that they never received the paperwork and would like to have it re faxed to an alternative fax number. Fax:[phone].

## 2024-05-29 ENCOUNTER — Other Ambulatory Visit: Payer: Self-pay

## 2024-05-29 ENCOUNTER — Telehealth

## 2024-05-29 NOTE — Patient Instructions (Signed)
 Visit Information  Thank you for taking time to visit with me today. Please don't hesitate to contact me if I can be of assistance to you before our next scheduled appointment.  Your next care management appointment is by telephone on 06/26/2024 at 1:30 pm LCSW 06/23/24 at 2:00 pm  Telephone follow-up in 1 month  Please call the care guide team at [phone] if you need to cancel, schedule, or reschedule an appointment.   Please call the Suicide and Crisis Lifeline: 988 call the [place]  National Suicide Prevention Lifeline: 781-572-1635 or TTY: 407-226-3678 TTY (631)211-2289) to talk to a trained counselor call 1-800-273-TALK (toll free, 24 hour hotline) go to [facility] [address], [place] [phone]) call 911 if you are experiencing a Mental Health or Behavioral Health Crisis or need someone to talk to.  Nestora Duos, MSN, RN [place]  [facility], [facility] Health RN Care Manager Direct Dial: [phone] Fax: [phone]   Constipation, Adult Constipation is when a person has trouble pooping (having a bowel movement). When you have this condition, you may poop fewer than 3 times a week. Your poop (stool) may also be dry, hard, or bigger than normal. Follow these instructions at home: Eating and drinking  Eat foods that have a lot of fiber, such as: Fresh fruits and vegetables. Whole grains. Beans. Eat less of foods that are low in fiber and high in fat and sugar, such as: French fries. Hamburgers. Cookies. Candy. Soda. Drink enough fluid to keep your pee (urine) pale yellow. General instructions Exercise regularly or as told by your doctor. Try to do 150 minutes of exercise each week. Go to the restroom when you feel like you need to poop. Do not hold it in. Take over-the-counter and prescription medicines only as told by your doctor. These include any fiber supplements. When you poop: Do deep  breathing while relaxing your lower belly (abdomen). Relax your pelvic floor. The pelvic floor is a group of muscles that support the rectum, bladder, and intestines (as well as the uterus in women). Watch your condition for any changes. Tell your doctor if you notice any. Keep all follow-up visits as told by your doctor. This is important. Contact a doctor if: You have pain that gets worse. You have a fever. You have not pooped for 4 days. You vomit. You are not hungry. You lose weight. You are bleeding from the opening of the butt (anus). You have thin, pencil-like poop. Get help right away if: You have a fever, and your symptoms suddenly get worse. You leak poop or have blood in your poop. Your belly feels hard or bigger than normal (bloated). You have very bad belly pain. You feel dizzy or you faint. Summary Constipation is when a person poops fewer than 3 times a week, has trouble pooping, or has poop that is dry, hard, or bigger than normal. Eat foods that have a lot of fiber. Drink enough fluid to keep your pee (urine) pale yellow. Take over-the-counter and prescription medicines only as told by your doctor. These include any fiber supplements. This information is not intended to replace advice given to you by your health care provider. Make sure you discuss any questions you have with your health care provider. Document Revised: 05/20/2022 Document Reviewed: 05/20/2022 Elsevier Patient Education  2024 [employer].

## 2024-05-29 NOTE — Telephone Encounter (Signed)
 Faxed OV Note from Dr Gaston to the number provided in the fax. We do not have an order sheet. What we received was a fax asking for documentation about her incontinence.

## 2024-05-29 NOTE — Patient Outreach (Signed)
 Complex Care Management   Visit Note  05/29/2024  Name:  Stephany Poorman MRN: [id number] DOB: Jun 22, 1951  Situation: Referral received for Complex Care Management related to Seizures, HTN Falls I obtained verbal consent from Caregiver.  Visit completed with Caregiver  on the phone  Background:   Past Medical History:  Diagnosis Date   Bipolar 1 disorder (HCC)    Depression    Hepatitis C    High cholesterol    Hypertension    Seizures (HCC)    UTI (urinary tract infection)     Assessment: Patient Reported Symptoms:  Cognitive Cognitive Status: Alert and oriented to person, place, and time, Normal speech and language skills, Struggling with memory recall Cognitive/Intellectual Conditions Management [RPT]: None reported or documented in medical history or problem list   Health Maintenance Behaviors: Annual physical exam  Neurological Neurological Review of Symptoms: Weakness Neurological Comment: depressive episode - sleepig a lot, no seizure activity - taking meds as scheduled  HEENT HEENT Symptoms Reported: No symptoms reported      Cardiovascular Cardiovascular Symptoms Reported: Swelling in legs or feet Cardiovascular Management Strategies: Routine screening, Medication therapy Cardiovascular Comment: new swelling in legs about 2-3 Yashika Mask ago, seen by PCP, stopped atorvastatin  for myalgia, legs still sore  Respiratory Respiratory Symptoms Reported: Shortness of breath Other Respiratory Symptoms: DOE, occasional use of inhaler with weather change and exertion Respiratory Management Strategies: Medication therapy, Routine screening  Endocrine Endocrine Symptoms Reported: Not assessed Is patient diabetic?: No Endocrine Comment: daughter manages diet - prediabetic, patient refusing to eat at times since cannot eat what she wants  Gastrointestinal Gastrointestinal Symptoms Reported: Constipation Additional Gastrointestinal Details: using Miralax  still occ constipation,  hydrating      Genitourinary Genitourinary Symptoms Reported: Incontinence Additional Genitourinary Details: RNCM following up with [facility] re [employer] incontinence supplies    Integumentary Integumentary Symptoms Reported: No symptoms reported    Musculoskeletal Musculoskelatal Symptoms Reviewed: Joint pain Additional Musculoskeletal Details: leg pain continues after stopping atorvastatin  for myalgias, reports fall before stopping atorvastatin , legs gave out no injury Musculoskeletal Management Strategies: Routine screening Falls in the past year?: Yes Number of falls in past year: 2 or more Was there an injury with Fall?: Yes Fall Risk Category Calculator: 3 Patient Fall Risk Level: High Fall Risk Patient at Risk for Falls Due to: History of fall(s), Impaired balance/gait, Impaired mobility Fall risk Follow up: Falls evaluation completed, Falls prevention discussed  Psychosocial Psychosocial Symptoms Reported: Depression - if selected complete PHQ 2-9 Other Psychosocial Conditions: visit completed with daughter - reports patient with depressive episode - sleeping a lot but keeping on schedule with meds. daughter reports overwhelmed, interested in placing patient, discussed caregiver stress, scheduled with LCSW to assist in finding appropriate placement, limited eating since daughter providng diabetic low salt diet and patient not getting preferred foods, on wiaintg list for [facility] but not idea when would be receiving services Behavioral Management Strategies: Medication therapy, Counseling Major Change/Loss/Stressor/Fears (CP): Medical condition, self Techniques to Cope with Loss/Stress/Change: Counseling      05/29/2024    PHQ2-9 Depression Screening   Little interest or pleasure in doing things    Feeling down, depressed, or hopeless    PHQ-2 - Total Score    Trouble falling or staying asleep, or sleeping too much    Feeling tired or having little energy    Poor appetite  or overeating     Feeling bad about yourself - or that you are a failure or have let yourself  or your family down    Trouble concentrating on things, such as reading the newspaper or watching television    Moving or speaking so slowly that other people could have noticed.  Or the opposite - being so fidgety or restless that you have been moving around a lot more than usual    Thoughts that you would be better off dead, or hurting yourself in some way    PHQ2-9 Total Score    If you checked off any problems, how difficult have these problems made it for you to do your work, take care of things at home, or get along with other people    Depression Interventions/Treatment      There were no vitals filed for this visit.  Medications Reviewed Today     Reviewed by Devra Lands, RN (Registered Nurse) on 05/29/24 at 1350  Med List Status: <None>   Medication Order Taking? Sig Documenting Provider Last Dose Status Informant  albuterol  (VENTOLIN  HFA) 108 (90 Base) MCG/ACT inhaler [id number] Yes Inhale 2 puffs into the lungs every 4 (four) hours as needed for shortness of breath. Bowa, Nahosi K, MD  Active   amLODipine  (NORVASC ) 5 MG tablet [id number] Yes TAKE 1 TABLET (5 MG TOTAL) BY MOUTH DAILY. Corwin Antu, FNP  Active Child, Pharmacy Records  ammonium lactate  (AMLACTIN DAILY) 12 % lotion [id number] Yes Apply 1 Application topically as needed. Tobie Franky SQUIBB, DPM  Active Child, Pharmacy Records  Calcium  Carb-Cholecalciferol  (CALCIUM  + VITAMIN D3) 600-10 MG-MCG TABS [id number] Yes Take 2 tablets by mouth daily. Bowa, Nahosi K, MD  Active   diazePAM , 20 MG Dose, 2 x 10 MG/0.1ML LQPK [id number] Yes Place 20 mg into the nose as needed (seizure lasting more than 2 mins). Yadav, Priyanka O, MD  Active   divalproex  (DEPAKOTE ) 500 MG DR tablet [id number] Yes Take 1 tablet (500 mg total) by mouth every 12 (twelve) hours. Elgergawy, Brayton RAMAN, MD  Active   fluticasone -salmeterol (WIXELA INHUB ) 100-50 MCG/ACT  AEPB [id number] Yes Inhale 1 puff into the lungs 2 (two) times daily. Raenelle Donalda HERO, MD  Active Child, Pharmacy Records  Incontinence Supply Disposable (DEPEND ADJUSTABLE UNDERWEAR LG) MISC [id number] Yes 1 each by Does not apply route daily. Bowa, Nahosi K, MD  Active   metoprolol  succinate (TOPROL -XL) 50 MG 24 hr tablet [id number] Yes TAKE 1 TABLET BY MOUTH EVERY DAY Jimmy Charlie FERNS, MD  Active   mirtazapine  (REMERON ) 30 MG tablet [id number] Yes Take 30 mg by mouth at bedtime. Daughter states patient psychiatrist advised patient to take 15 mg per day.  Daughter states she will call psychiatry office to clarify dosage. [provider]  Active   Multiple Vitamins-Minerals (MULTIVITAMIN WOMEN 50+) TABS [id number] Yes Take 1 tablet by mouth daily. Centrum Silver [provider]  Active Child, Pharmacy Records  sertraline (ZOLOFT) 50 MG tablet [id number] Yes Take 50 mg by mouth daily. [provider]  Active   traZODone  (DESYREL ) 50 MG tablet [id number] Yes Take 50 mg by mouth at bedtime. [provider]  Active             Recommendation:   PCP Follow-up Continue Current Plan of Care  Follow Up Plan:   Telephone follow-up in 1 month  Lands Devra, MSN, RN [facility]  [facility], [facility] RN Care Manager Direct Dial: [phone] Fax: [phone]

## 2024-05-30 ENCOUNTER — Encounter: Payer: Self-pay | Admitting: Neurology

## 2024-05-30 ENCOUNTER — Ambulatory Visit: Admitting: Neurology

## 2024-05-30 VITALS — BP 131/72 | HR 67 | Ht 64.0 in | Wt 193.5 lb

## 2024-05-30 DIAGNOSIS — G40901 Epilepsy, unspecified, not intractable, with status epilepticus: Secondary | ICD-10-CM

## 2024-05-30 DIAGNOSIS — F250 Schizoaffective disorder, bipolar type: Secondary | ICD-10-CM

## 2024-05-30 DIAGNOSIS — F32A Depression, unspecified: Secondary | ICD-10-CM

## 2024-05-30 NOTE — Patient Instructions (Addendum)
 Restart sertraline by itself in the morning Continue your other medications including Depakote  500 mg twice daily Continue to follow with your PCP and psychiatrist Return in 1 year or sooner if worse.

## 2024-05-30 NOTE — Progress Notes (Signed)
 [facility]  PATIENT: Shelby Jordan DOB: 11/09/1950  REQUESTING CLINICIAN: Jimmy Charlie FERNS, MD HISTORY FROM: Patient but mainly Daughter, and chart review  REASON FOR VISIT: Seizure    HISTORICAL  CHIEF COMPLAINT:  Chief Complaint  Patient presents with   Follow-up    Pt in room 12. Daughter in room. Here for seizure follow up.   INTERVAL HISTORY 05/30/2024 Patient presents today for follow-up, she is accompanied by her daughter.  Last visit was in September and since then, she has not had any seizure or seizure like activity.  We obtained an ambulatory EEG on October 17 and it was normal, no seizure or seizure like activity and no epileptiform discharges.  She is compliant with her medications with daughters help.  Daughter reports more depressed symptoms than usual.  Her psychiatrist has started her on Zoloft but daughter tells me that she tried 1 time and slept the entire day but she gave it with Remeron  and trazodone  together at night.    INTERVAL HISTORY 03/29/2024 Patient presents today for follow-up, last visit was in April, at that time we continued her on Depakote  500 mg twice daily.  Daughter tells me in July she did have a manic episode, taken to the hospital and found to be in status epilepticus.  Patient EEG showed multiple nonconvulsive seizures.  Per chart review patient Depakote  was increased to 500 mg twice daily, I wonder if she was taking 500 mg daily instead of twice daily as recommended.  Since  discharge from the hospital, daughter tells me that patient has been more depressed, they follow-up with psychiatrist who started mirtazapine .  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  Patient is not currently on an antipsychotic or mood stabilizer.    HISTORY OF PRESENT ILLNESS:  This is 73 year old woman past medical history of schizoaffective disorder, bipolar disorder, seizure  disorder, anxiety, depression who is presenting after a seizure on January 18.  History is mainly obtained from daughter.  Daughter tells me on January 18, patient fell while coming home after visiting a neighbor, she hit her head but they did not think of patient having a seizure at that time. The next day, they found patient on the floor with unexplained fall, she was taken to the hospital and in the ED waiting room she did have a seizure.  Patient was taken in the back, observed, was back to baseline and discharged home, while at home, again patient was found in the closet on the floor unexplained fall. Family thought that she might have another seizure and took her to [facility]. While in [facility] ED she was reported to have a generalized seizure.  After further history, it was noted that patient was out of the Depakote  for about a month and a half.  Upon further history, daughter reports patient did have a seizure 3 years ago in the setting of running out of Depakote .  Daughter tells me that he believes patient first seizure was around 17 years ago.  She has been on Depakote  for bipolar disorder all this time and the family did not think that she had a seizure disorder.  Since being discharged from the hospital, she is on Depakote  500 mg twice daily, she is back to her normal self and no additional seizure, her Depakote  level was checked and was within normal limits.  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  She is following with a psychiatrist and she is not currently on any antipsychotic but they feel that she is the same, she is not getting worse.    OTHER MEDICAL CONDITIONS: Hypertension, Hyperlipidemia, Bipolar, Schizoaffective disorder   REVIEW OF SYSTEMS: Full 14 system review of systems performed and negative with exception of: As noted in the HPI   ALLERGIES: Allergies  Allergen  Reactions   Olanzapine  Other (See Comments)    Parkinson-like symptoms   Tomato (Diagnostic) Other (See Comments)    Tongue break out.    Citrus Rash    HOME MEDICATIONS: Outpatient Medications Prior to Visit  Medication Sig Dispense Refill   albuterol  (VENTOLIN  HFA) 108 (90 Base) MCG/ACT inhaler Inhale 2 puffs into the lungs every 4 (four) hours as needed for shortness of breath. 18 g 1   amLODipine  (NORVASC ) 5 MG tablet TAKE 1 TABLET (5 MG TOTAL) BY MOUTH DAILY. 90 tablet 3   ammonium lactate  (AMLACTIN DAILY) 12 % lotion Apply 1 Application topically as needed. 400 g 0   Calcium  Carb-Cholecalciferol  (CALCIUM  + VITAMIN D3) 600-10 MG-MCG TABS Take 2 tablets by mouth daily. 180 tablet 0   diazePAM , 20 MG Dose, 2 x 10 MG/0.1ML LQPK Place 20 mg into the nose as needed (seizure lasting more than 2 mins). 5 each 3   divalproex  (DEPAKOTE ) 500 MG DR tablet Take 1 tablet (500 mg total) by mouth every 12 (twelve) hours. 180 tablet 0   fluticasone -salmeterol (WIXELA INHUB ) 100-50 MCG/ACT AEPB Inhale 1 puff into the lungs 2 (two) times daily. 60 each 1   Incontinence Supply Disposable (DEPEND ADJUSTABLE UNDERWEAR LG) MISC 1 each by Does not apply route daily. 32 each 5   metoprolol  succinate (TOPROL -XL) 50 MG 24 hr tablet TAKE 1 TABLET BY MOUTH EVERY DAY 90 tablet 0   mirtazapine  (REMERON ) 30 MG tablet Take 30 mg by mouth at bedtime. Daughter states patient psychiatrist advised patient to take 15 mg per day.  Daughter states she will call psychiatry office to clarify dosage.     Multiple Vitamins-Minerals (MULTIVITAMIN WOMEN 50+) TABS Take 1 tablet by mouth daily. Centrum Silver     sertraline (ZOLOFT) 50 MG tablet Take 50 mg by mouth daily.     traZODone  (DESYREL ) 50 MG tablet Take 50 mg by mouth at bedtime.     No facility-administered medications prior to visit.    PAST MEDICAL HISTORY: Past Medical History:  Diagnosis Date   Bipolar 1 disorder (HCC)    Depression    Hepatitis C    High  cholesterol    Hypertension    Seizures (HCC)    UTI (urinary tract infection)     PAST SURGICAL HISTORY: Past Surgical History:  Procedure Laterality Date   APPENDECTOMY     BREAST BIOPSY Right 03/04/2022   stereo bx, mass X clip-path pending   TOE AMPUTATION      FAMILY HISTORY: Family History  Problem Relation Age of Onset   Mental illness Mother        born in mental hospital   Breast cancer Neg Hx    Colon cancer Neg Hx    Rectal cancer Neg Hx    Stomach cancer Neg Hx     SOCIAL HISTORY: Social History   Socioeconomic History   Marital status: Legally Separated    Spouse name: Not on file   Number of children: Not on file   Years  of education: Not on file   Highest education level: Not on file  Occupational History   Not on file  Tobacco Use   Smoking status: Some Days    Current packs/day: 0.00    Average packs/day: 1 pack/day for 30.0 years (30.0 ttl pk-yrs)    Types: Cigarettes    Start date: 32    Last attempt to quit: 2023    Years since quitting: 2.8    Passive exposure: Past   Smokeless tobacco: Never  Vaping Use   Vaping status: Never Used  Substance and Sexual Activity   Alcohol use: Never   Drug use: Never   Sexual activity: Not Currently    Partners: Male  Other Topics Concern   Not on file  Social History Narrative   Not on file   Social Drivers of Health   Financial Resource Strain: Low Risk  (08/26/2023)   Overall Financial Resource Strain (CARDIA)    Difficulty of Paying Living Expenses: Not hard at all  Food Insecurity: Food Insecurity Present (05/29/2024)   Hunger Vital Sign    Worried About Running Out of Food in the Last Year: Often true    Ran Out of Food in the Last Year: Often true  Transportation Needs: No Transportation Needs (04/06/2024)   PRAPARE - Administrator, Civil Service (Medical): No    Lack of Transportation (Non-Medical): No  Physical Activity: Inactive (08/26/2023)   Exercise Vital Sign     Days of Exercise per Week: 0 days    Minutes of Exercise per Session: 0 min  Stress: No Stress Concern Present (08/26/2023)   [employer] of Occupational Health - Occupational Stress Questionnaire    Feeling of Stress : Not at all  Social Connections: Socially Isolated (01/30/2024)   Social Connection and Isolation Panel    Frequency of Communication with Friends and Family: More than three times a week    Frequency of Social Gatherings with Friends and Family: Once a week    Attends Religious Services: Never    Database Administrator or Organizations: No    Attends Banker Meetings: Never    Marital Status: Widowed  Intimate Partner Violence: Patient Unable To Answer (04/06/2024)   Humiliation, Afraid, Rape, and Kick questionnaire    Fear of Current or Ex-Partner: Patient unable to answer    Emotionally Abused: Patient unable to answer    Physically Abused: Patient unable to answer    Sexually Abused: Patient unable to answer    PHYSICAL EXAM  GENERAL EXAM/CONSTITUTIONAL: Vitals:  Vitals:   05/30/24 1447  BP: 131/72  Pulse: 67  Weight: 193 lb 8 oz (87.8 kg)  Height: 5' 4 (1.626 m)   Body mass index is 33.21 kg/m. Wt Readings from Last 3 Encounters:  05/30/24 193 lb 8 oz (87.8 kg)  05/17/24 201 lb (91.2 kg)  05/08/24 198 lb 9.6 oz (90.1 kg)   Patient is in no distress; well developed, nourished and groomed; neck is supple  MUSCULOSKELETAL: Gait, strength, tone, movements noted in Neurologic exam below  NEUROLOGIC: MENTAL STATUS:     03/02/2022    2:27 PM  MMSE - Mini Mental State Exam  Orientation to time 4   awake, alert, oriented to person, able to tell me today's date, tells me there are 6/4 in $1,75.  CRANIAL NERVE:  2nd, 3rd, 4th, 6th - Visual fields full to confrontation, extraocular muscles intact, no nystagmus 5th - facial sensation symmetric 7th -  facial strength symmetric 8th - hearing intact 9th - palate elevates symmetrically,  uvula midline 11th - shoulder shrug symmetric 12th - tongue protrusion midline  MOTOR:  normal bulk and tone, at least antigravity, able to stand.   SENSORY:  normal and symmetric to light touch  COORDINATION:  finger-nose-finger, fine finger movements normal  GAIT/STATION:  Deferred, using a wheelchair but able to stand.    DIAGNOSTIC DATA (LABS, IMAGING, TESTING) - I reviewed patient records, labs, notes, testing and imaging myself where available.  Lab Results  Component Value Date   WBC 5.1 04/13/2024   HGB 13.2 04/13/2024   HCT 40.0 04/13/2024   MCV 90.4 04/13/2024   PLT 210.0 04/13/2024      Component Value Date/Time   NA 139 05/17/2024 1229   NA 140 03/29/2024 1201   K 4.3 05/17/2024 1229   CL 101 05/17/2024 1229   CO2 31 05/17/2024 1229   GLUCOSE 135 (H) 05/17/2024 1229   BUN 26 (H) 05/17/2024 1229   BUN 23 03/29/2024 1201   CREATININE 1.51 (H) 05/17/2024 1229   CALCIUM  9.6 05/17/2024 1229   PROT 7.2 03/29/2024 1201   ALBUMIN 4.5 04/13/2024 1533   ALBUMIN 4.5 03/29/2024 1201   AST 20 03/29/2024 1201   ALT 19 03/29/2024 1201   ALKPHOS 49 03/29/2024 1201   BILITOT 0.3 03/29/2024 1201   GFRNONAA 56 (L) 01/30/2024 0408   GFRAA 40 (L) 01/01/2020 1732   Lab Results  Component Value Date   CHOL 206 (H) 04/27/2024   HDL 69.00 04/27/2024   LDLCALC 119 (H) 04/27/2024   TRIG 90.0 04/27/2024   Lab Results  Component Value Date   HGBA1C 6.1 05/17/2024   Lab Results  Component Value Date   VITAMINB12 1,356 (H) 04/15/2023   Lab Results  Component Value Date   TSH 1.09 03/02/2022   Depakote  level 10/08/2023  90.1  MRI Brain 08/10/2023 No acute or reversible finding. 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 Moderate to pronounced chronic small-vessel ischemic changes of the deep and subcortical white matter of both hemispheres.  Overnight EEG 08/09/2023 This study is within normal limits. No seizures  or epileptiform discharges were seen throughout the recording. A normal interictal EEG does not exclude the diagnosis of epilepsy   EEG 01/30/2024 - Status epilepticus, left frontal region - Seizure without clinical signs, left frontal region  - Continuous slow, generalized   IMPRESSION: At the beginning of the study, seizure was ongoing. EEG then showed three more seizure without clinical signs arising from left frontal region on 01/30/2024 at 0041, 0045, 0047, lasting about 1-2 minutes each.    At around 0058, EEG worsened and patient went to status epilepticus arising from left frontal region. Initially, no clinical signs were noted. At around 0129, patient was noted to have right gaze deviation followed by generalized stiffening. Seizure ended at 0131.    Additionally there is moderate diffuse encephalopathy   Amb EEG 05/05/2024 Normal   ASSESSMENT AND PLAN  73 y.o. year old female  with history of schizoaffective disorder, bipolar disorder, hypertension, hyperlipidemia and seizure disorder who is presenting for follow-up.  Since last visit in September she has not had any seizure or seizure like activity.  Her 3-day ambulatory EEG did not show any additional seizures and no subclinical seizures.  She is compliant with her medications including Depakote  500 mg twice daily.  She is also on Remeron  and Trazodone  for depression and  sleep and her psychiatrist has also started sertraline.  Daughter tells me she tried sertraline once with the Remeron  and trazodone  together and patient sleep for many hours.  Advised her to restart the Sertraline one more time in the morning by itself.  If any side effects, she should contact her psychiatrist.  Continue your others medication and I will see you in 1 year for follow-up or sooner if worse. Call for any other questions or concerns.   1. Nonintractable epilepsy with status epilepticus, unspecified epilepsy type (HCC)   2. Schizoaffective disorder,  bipolar type (HCC)   3. Depression, unspecified depression type     Patient Instructions  Restart sertraline by itself in the morning Continue your other medications including Depakote  500 mg twice daily Continue to follow with your PCP and psychiatrist Return in 1 year or sooner if worse.   Per [place]  DMV statutes, patients with seizures are not allowed to drive until they have been seizure-free for six months.  Other recommendations include using caution when using heavy equipment or power tools. Avoid working on ladders or at heights. Take showers instead of baths.  Do not swim alone.  Ensure the water  temperature is not too high on the home water  heater. Do not go swimming alone. Do not lock yourself in a room alone (i.e. bathroom). When caring for infants or small children, sit down when holding, feeding, or changing them to minimize risk of injury to the child in the event you have a seizure. Maintain good sleep hygiene. Avoid alcohol.  Also recommend adequate sleep, hydration, good diet and minimize stress.   During the Seizure  - First, ensure adequate ventilation and place patients on the floor on their left side  Loosen clothing around the neck and ensure the airway is patent. If the patient is clenching the teeth, do not force the mouth open with any object as this can cause severe damage - Remove all items from the surrounding that can be hazardous. The patient may be oblivious to what's happening and may not even know what he or she is doing. If the patient is confused and wandering, either gently guide him/her away and block access to outside areas - Reassure the individual and be comforting - Call 911. In most cases, the seizure ends before EMS arrives. However, there are cases when seizures may last over 3 to 5 minutes. Or the individual may have developed breathing difficulties or severe injuries. If a pregnant patient or a person with diabetes develops a seizure, it is  prudent to call an ambulance. - Finally, if the patient does not regain full consciousness, then call EMS. Most patients will remain confused for about 45 to 90 minutes after a seizure, so you must use judgment in calling for help. - Avoid restraints but make sure the patient is in a bed with padded side rails - Place the individual in a lateral position with the neck slightly flexed; this will help the saliva drain from the mouth and prevent the tongue from falling backward - Remove all nearby furniture and other hazards from the area - Provide verbal assurance as the individual is regaining consciousness - Provide the patient with privacy if possible - Call for help and start treatment as ordered by the caregiver   After the Seizure (Postictal Stage)  After a seizure, most patients experience confusion, fatigue, muscle pain and/or a headache. Thus, one should permit the individual to sleep. For the next few days, reassurance is  essential. Being calm and helping reorient the person is also of importance.  Most seizures are painless and end spontaneously. Seizures are not harmful to others but can lead to complications such as stress on the lungs, brain and the heart. Individuals with prior lung problems may develop labored breathing and respiratory distress.    Discussed Patients with epilepsy have a small risk of sudden unexpected death, a condition referred to as sudden unexpected death in epilepsy (SUDEP). SUDEP is defined specifically as the sudden, unexpected, witnessed or unwitnessed, nontraumatic and nondrowning death in patients with epilepsy with or without evidence for a seizure, and excluding documented status epilepticus, in which post mortem examination does not reveal a structural or toxicologic cause for death     No orders of the defined types were placed in this encounter.   No orders of the defined types were placed in this encounter.   Return in about 1 year (around  05/30/2025).  I personally spent a total of 40 minutes in the care of the patient today including preparing to see the patient, getting/reviewing separately obtained history, performing a medically appropriate exam/evaluation, counseling and educating, placing orders, and documenting clinical information in the EHR.  Pastor Falling, MD 05/30/2024, 5:12 PM  [facility] [address] [place], [place] [address] [phone]

## 2024-05-31 NOTE — Progress Notes (Signed)
 Please call patient's daughter and explain that the blood pressure cuff at the [facility] pharmacy has to be bought, we do not send a prescription, it costs about $35.

## 2024-06-01 ENCOUNTER — Other Ambulatory Visit: Payer: Self-pay

## 2024-06-01 DIAGNOSIS — I10 Essential (primary) hypertension: Secondary | ICD-10-CM

## 2024-06-01 MED ORDER — METOPROLOL SUCCINATE ER 50 MG PO TB24: 50.0000 mg | ORAL_TABLET | Freq: Every day | ORAL | Status: DC

## 2024-06-02 ENCOUNTER — Other Ambulatory Visit: Payer: Self-pay

## 2024-06-02 NOTE — Patient Instructions (Signed)
 Visit Information  Thank you for taking time to visit with me today. Please don't hesitate to contact me if I can be of assistance to you before our next scheduled appointment.  Your next care management appointment is by telephone on 06/13/24 at 12pm    Please call the care guide team at [phone] if you need to cancel, schedule, or reschedule an appointment.   Please call the Suicide and Crisis Lifeline: 988 call the [place]  National Suicide Prevention Lifeline: (219)434-2132 or TTY: 249-209-4700 TTY (651) 196-2137) to talk to a trained counselor call 1-800-273-TALK (toll free, 24 hour hotline) go to [facility] [address], [place] [phone]) call 911 if you are experiencing a Mental Health or Behavioral Health Crisis or need someone to talk to.  Thersia Hoar, HEDWIG, MHA [place]  [facility] Social Worker, Population Health [phone]

## 2024-06-02 NOTE — Patient Outreach (Signed)
 Social Drivers of Health  Community Resource and Care Coordination Visit Note   06/02/2024  Name: Shelby Jordan MRN: [id number] DOB:[birth year]  Situation: Referral received for SDoH needs assessment and assistance related to Assisted living facilities. I obtained verbal consent from Caregiver.  Visit completed with daughter/caregiver on the phone.   Background:      Assessment:   Goals Addressed             This Visit's Progress    BSW VBCI Social Work Care Plan       Problems:   Food Insecurity  and incontinence supplies and blood pressure cuff  CSW Clinical Goal(s):   Over the next 30 days the Patient will work with Child Psychotherapist to address concerns related to food, incontinence supplies and blood pressure cuff.  Interventions:  SW will send PCP a message for a script for incontinence supplies and blood pressure cuff. SW will send resources for supplies if Medicaid does not cover.SW mailed resources for [employer]. SW mailed resources for ensures and assisted living facilities  Patient Goals/Self-Care Activities:  Patient and daughter will [employer] send and apply for foodstamps  Plan:   SW will follow up on 06/13/24 at 12pm         Recommendation:   Use resources sw provided.  Follow Up Plan:   Telephone follow-up 06/13/24 at 12pm  Thersia Hoar, BSW, [facility] [place]  Value Based [facility] Social Worker, Population Health [phone]

## 2024-06-12 ENCOUNTER — Telehealth: Payer: Self-pay

## 2024-06-12 NOTE — Telephone Encounter (Signed)
 Copied from CRM [id number]. Topic: Referral - Question >> Jun 12, 2024 12:37 PM Shelby Jordan wrote: Reason for CRM: Pt is calling to check the status of the kidney referral. Pt stated that she never received a call from the office and the referral was submitted on 05/17/24. Pt stated that the issues are getting worse and would like a callback with an update.

## 2024-06-13 ENCOUNTER — Other Ambulatory Visit: Payer: Self-pay

## 2024-06-13 NOTE — Patient Outreach (Signed)
 Social Drivers of Health  Community Resource and Care Coordination Visit Note   06/13/2024  Name: Shelby Jordan MRN: [id number] DOB:April 17, 1951  Situation: Referral received for SDoH needs assessment and assistance related to ensures and assisted living facilities. I obtained verbal consent from Patient.  Visit completed with Patient on the phone.   Background:      Assessment:   Goals Addressed             This Visit's Progress    BSW VBCI Social Work Care Plan       Problems:   Food Insecurity  and incontinence supplies and blood pressure cuff  CSW Clinical Goal(s):   Over the next 30 days the Patient will work with Child Psychotherapist to address concerns related to food, incontinence supplies and blood pressure cuff.  Interventions:  SW will send PCP a message for a script for incontinence supplies and blood pressure cuff. SW will send resources for supplies if Medicaid does not cover.SW mailed resources for [employer]. SW mailed resources for ensures and assisted living facilities  Patient Goals/Self-Care Activities:  Patient and daughter will [employer] send and apply for foodstamps Patient did receieve a food bag and supplies from [employer].  Plan:   SW will follow up on 07/03/24 at 12pm         Recommendation:   Daughter will continue to use [employer] for diaper resources  Follow Up Plan:   Telephone follow-up 07/03/24 at 12pm  Thersia Hoar, BSW, [facility] [place]  [facility] Social Worker, Population Health [phone]

## 2024-06-13 NOTE — Patient Instructions (Signed)
 Visit Information  Thank you for taking time to visit with me today. Please don't hesitate to contact me if I can be of assistance to you before our next scheduled appointment.  Your next care management appointment is by telephone on 07/03/24 at 10pm    Please call the care guide team at [phone] if you need to cancel, schedule, or reschedule an appointment.   Please call the Suicide and Crisis Lifeline: 988 call the [place]  National Suicide Prevention Lifeline: (617) 361-9418 or TTY: (920)376-1031 TTY 703 381 9192) to talk to a trained counselor call 1-800-273-TALK (toll free, 24 hour hotline) call 911 if you are experiencing a Mental Health or Behavioral Health Crisis or need someone to talk to.  Thersia Hoar, HEDWIG, MHA [place]  [facility] Social Worker, Population Health [phone]

## 2024-06-23 ENCOUNTER — Telehealth: Payer: Self-pay | Admitting: *Deleted

## 2024-06-23 ENCOUNTER — Encounter: Payer: Self-pay | Admitting: *Deleted

## 2024-06-23 NOTE — Patient Instructions (Signed)
 Shelby Jordan - I am sorry I was unable to reach you today for our scheduled appointment. I work with Bennett Reuben POUR, MD and am calling to support your healthcare needs. Please contact me at [phone] at your earliest convenience. I look forward to speaking with you soon.   Thank you,    Senya Hinzman, LCSW [place]  [facility], [facility] Licensed Clinical Social Worker  Direct Dial: [phone]

## 2024-06-26 ENCOUNTER — Other Ambulatory Visit: Payer: Self-pay

## 2024-06-26 NOTE — Patient Instructions (Signed)
 Visit Information  Thank you for taking time to visit with me today. Please don't hesitate to contact me if I can be of assistance to you before our next scheduled appointment.  Your next care management appointment is by telephone on 07/25/2023 at 1:30 pm  Telephone follow-up in 1 month  Please call the care guide team at [phone] if you need to cancel, schedule, or reschedule an appointment.   Please call the Suicide and Crisis Lifeline: 988 call the [place]  National Suicide Prevention Lifeline: 269 540 9995 or TTY: 404-105-2908 TTY (417)194-5700) to talk to a trained counselor call 1-800-273-TALK (toll free, 24 hour hotline) go to [facility] [address] [place] [phone]) call 911 if you are experiencing a Mental Health or Behavioral Health Crisis or need someone to talk to.  Nestora Duos, MSN, RN [facility], [facility] Health RN Care Manager Direct Dial: [phone] Fax: [phone]

## 2024-06-26 NOTE — Patient Outreach (Signed)
 Complex Care Management   Visit Note  06/26/2024  Name:  Shelby Jordan MRN: [id number] DOB: 1951/06/13  Situation: Referral received for Complex Care Management related to Seizures HTN Falls I obtained verbal consent from Caregiver.  Visit completed with Caregiver  on the phone  Background:   Past Medical History:  Diagnosis Date   Bipolar 1 disorder (HCC)    Depression    Hepatitis C    High cholesterol    Hypertension    Seizures (HCC)    UTI (urinary tract infection)     Assessment: Patient Reported Symptoms:  Cognitive Cognitive Status: Alert and oriented to person, place, and time, Normal speech and language skills, Struggling with memory recall (reports more awake) Cognitive/Intellectual Conditions Management [RPT]: None reported or documented in medical history or problem list   Health Maintenance Behaviors: Annual physical exam  Neurological Neurological Review of Symptoms: No symptoms reported Neurological Comment: no seizure activity - has emergency diazepam   HEENT HEENT Symptoms Reported: No symptoms reported      Cardiovascular Does patient have uncontrolled Hypertension?: No Cardiovascular Management Strategies: Routine screening, Medication therapy Cardiovascular Comment: no swelling in legs  Respiratory Respiratory Symptoms Reported: No symptoms reported Other Respiratory Symptoms: occasioal DOe with stairs, Respiratory Management Strategies: Medication therapy, Routine screening  Endocrine Endocrine Symptoms Reported: Not assessed Is patient diabetic?: No    Gastrointestinal Gastrointestinal Symptoms Reported: No symptoms reported Additional Gastrointestinal Details: hydrating much better and constipatio improved      Genitourinary Genitourinary Symptoms Reported: Incontinence Additional Genitourinary Details: getting incontinence supplied from [employer]    Integumentary Integumentary Symptoms Reported: No symptoms reported    Musculoskeletal  Musculoskelatal Symptoms Reviewed: Joint pain Additional Musculoskeletal Details: legs etter but occasional pain - tylenol  prn no new falls Musculoskeletal Management Strategies: Routine screening Falls in the past year?: Yes Number of falls in past year: 2 or more Was there an injury with Fall?: Yes Fall Risk Category Calculator: 3 Patient Fall Risk Level: High Fall Risk Patient at Risk for Falls Due to: History of fall(s), Impaired balance/gait, Impaired mobility  Psychosocial Psychosocial Symptoms Reported: Depression - if selected complete PHQ 2-9 Other Psychosocial Conditions: visit with daughter, reports out of depressive episode last few Shelby Jordan, arranged for family members to call regularly and motivates her to get up, while things better still considering placement, LCSW appointment Behavioral Management Strategies: Medication therapy, Counseling Behavioral Health Comment: Followed by [facility] Psychiatrist Major Change/Loss/Stressor/Fears (CP): Medical condition, self Techniques to Cope with Loss/Stress/Change: Counseling      06/26/2024    PHQ2-9 Depression Screening   Little interest or pleasure in doing things    Feeling down, depressed, or hopeless    PHQ-2 - Total Score    Trouble falling or staying asleep, or sleeping too much    Feeling tired or having little energy    Poor appetite or overeating     Feeling bad about yourself - or that you are a failure or have let yourself or your family down    Trouble concentrating on things, such as reading the newspaper or watching television    Moving or speaking so slowly that other people could have noticed.  Or the opposite - being so fidgety or restless that you have been moving around a lot more than usual    Thoughts that you would be better off dead, or hurting yourself in some way    PHQ2-9 Total Score    If you checked off any problems, how difficult have these problems  made it for you to do your work, take care of things at  home, or get along with other people    Depression Interventions/Treatment      There were no vitals filed for this visit.    Medications Reviewed Today     Reviewed by Shelby Lands, RN (Registered Nurse) on 06/26/24 at 1338  Med List Status: <None>   Medication Order Taking? Sig Documenting Provider Last Dose Status Informant  albuterol  (VENTOLIN  HFA) 108 (90 Base) MCG/ACT inhaler [id number] Yes Inhale 2 puffs into the lungs every 4 (four) hours as needed for shortness of breath. Shelby Jordan, Shelby Jordan, Shelby Jordan  Active   amLODipine  (NORVASC ) 5 MG tablet [id number]  TAKE 1 TABLET (5 MG TOTAL) BY MOUTH DAILY. Shelby Antu, FNP  Active Child, Pharmacy Records  ammonium lactate  (AMLACTIN DAILY) 12 % lotion [id number] Yes Apply 1 Application topically as needed. Shelby Jordan, DPM  Active Child, Pharmacy Records  Calcium  Carb-Cholecalciferol  (CALCIUM  + VITAMIN D3) 600-10 MG-MCG TABS [id number] Yes Take 2 tablets by mouth daily. Shelby Jordan, Shelby Jordan, Shelby Jordan  Active   diazePAM , 20 MG Dose, 2 x 10 MG/0.1ML LQPK [id number] Yes Place 20 mg into the nose as needed (seizure lasting more than 2 mins). Shelby Jordan  Active   divalproex  (DEPAKOTE ) 500 MG DR tablet [id number] Yes Take 1 tablet (500 mg total) by mouth every 12 (twelve) hours. Shelby Jordan  Active   fluticasone -salmeterol (WIXELA INHUB ) 100-50 MCG/ACT AEPB [id number] Yes Inhale 1 puff into the lungs 2 (two) times daily. Shelby Donalda HERO, Shelby Jordan  Active Child, Pharmacy Records  Incontinence Supply Disposable (DEPEND ADJUSTABLE UNDERWEAR LG) MISC [id number] Yes 1 each by Does not apply route daily. Shelby Jordan, Shelby Jordan, Shelby Jordan  Active   metoprolol  succinate (TOPROL -XL) 50 MG 24 hr tablet [id number] Yes Take 1 tablet (50 mg total) by mouth daily. Shelby Jordan, Shelby Jordan, Shelby Jordan  Active   mirtazapine  (REMERON ) 30 MG tablet [id number] Yes Take 30 mg by mouth at bedtime. Daughter states patient psychiatrist advised patient to take 15 mg per day.  Daughter states she will call  psychiatry office to clarify dosage. Provider, Historical, Shelby Jordan  Active   Multiple Vitamins-Minerals (MULTIVITAMIN WOMEN 50+) TABS [id number] Yes Take 1 tablet by mouth daily. Centrum Silver Provider, Historical, Shelby Jordan  Active Child, Pharmacy Records  sertraline (ZOLOFT) 50 MG tablet [id number] Yes Take 50 mg by mouth daily. Provider, Historical, Shelby Jordan  Active   traZODone  (DESYREL ) 50 MG tablet [id number] Yes Take 50 mg by mouth at bedtime. Provider, Historical, Shelby Jordan  Active             Recommendation:   PCP Follow-up Continue Current Plan of Care  Follow Up Plan:   Telephone follow-up in 1 month  Jordan Devra, MSN, RN [facility]  [facility], [facility] Health RN Care Manager Direct Dial: [phone] Fax: [phone]

## 2024-07-03 ENCOUNTER — Telehealth: Payer: Self-pay

## 2024-07-03 NOTE — Patient Instructions (Signed)
 Shelby Jordan - I am sorry I was unable to reach you today for our scheduled appointment. I work with Bennett Reuben POUR, MD and am calling to support your healthcare needs. Please contact me at [phone] at your earliest convenience. I look forward to speaking with you soon.   Thank you, Thersia Hoar, BSW, MHA [place]  [facility] Social Worker, Population Health [phone]

## 2024-07-05 NOTE — Telephone Encounter (Signed)
 This was faxed to   [facility] [address] [place], [place] [address] Phone: [phone]  Patient is scheduled for 08/02/24

## 2024-07-17 ENCOUNTER — Telehealth: Payer: Self-pay

## 2024-07-17 ENCOUNTER — Ambulatory Visit: Admitting: Urology

## 2024-07-17 ENCOUNTER — Encounter: Payer: Self-pay | Admitting: Urology

## 2024-07-17 ENCOUNTER — Other Ambulatory Visit: Payer: Self-pay

## 2024-07-17 ENCOUNTER — Ambulatory Visit: Payer: Self-pay

## 2024-07-17 DIAGNOSIS — I10 Essential (primary) hypertension: Secondary | ICD-10-CM

## 2024-07-17 NOTE — Telephone Encounter (Signed)
 Will see if Dr Bennett wants to put her in a 1220 slot to be seen sooner. She was given an rx for a BP Monitor and told us  she got it but did not now how to use it. Asked them to bring it in the next time they came and it did not.

## 2024-07-17 NOTE — Telephone Encounter (Signed)
 Please call and schedule for annual physical

## 2024-07-17 NOTE — Telephone Encounter (Signed)
 Please call patient and advise that she is due for her annual physical so our clinic will call them to schedule her for that, we we will do follow-up on all of her other chronic conditions at this time as well.  Since the weakness is not new and is likely due to her Parkinson's it can be evaluated at that visit, however, if they feel it is worsening, we can send an order for home physical therapy to try to improve weakness and balance, let me know if they are willing to do that.

## 2024-07-17 NOTE — Telephone Encounter (Unsigned)
 Copied from CRM [id number]. Topic: Clinical - Medication Refill >> Jul 17, 2024  2:03 PM Joesph B wrote: Medication: metoprolol  succinate (TOPROL -XL) 50 MG 24 hr tablet [[id number]]  Has the patient contacted their pharmacy? Yes (Agent: If no, request that the patient contact the pharmacy for the refill. If patient does not wish to contact the pharmacy document the reason why and proceed with request.) (Agent: If yes, when and what did the pharmacy advise?)  This is the patient's preferred pharmacy:  [facility] [phone] [facility] [place] - [address] [place] [address] Phone: [phone] Fax: [phone]    Is this the correct pharmacy for this prescription? Yes If no, delete pharmacy and type the correct one.   Has the prescription been filled recently? Yes  Is the patient out of the medication? Yes- out of meds for a week   Has the patient been seen for an appointment in the last year OR does the patient have an upcoming appointment? Yes  Can we respond through MyChart? Yes  Agent: Please be advised that Rx refills may take up to 3 business days. We ask that you follow-up with your pharmacy.

## 2024-07-17 NOTE — Telephone Encounter (Signed)
 FYI Only or Action Required?: Action required by provider: request for appointment.  Patient was last seen in primary care on 05/17/2024 by Bennett Reuben POUR, MD.  Called Nurse Triage reporting Fatigue and Neurologic Problem.  Symptoms began dtr just kept saying a long time.  Interventions attempted: OTC medications: tylenol .  Symptoms are: gradually worsening.  Triage Disposition: See PCP When Office is Open (Within 3 Days)  Patient/caregiver understands and will follow disposition?: No, wishes to speak with PCP    Copied from CRM #[id number]. Topic: Clinical - Red Word Triage >> Jul 17, 2024  2:05 PM Joesph B wrote: Red Word that prompted transfer to Nurse Triage: weakness , out of bp meds Reason for Disposition  [1] Weakness of arm / hand, or leg / foot AND [2] is a chronic symptom (recurrent or ongoing AND present > 4 weeks)  Answer Assessment - Initial Assessment Questions Dtr called stating wants blood work to see how things are going. Asked if there were any concerns, she states just chronic ones. She needs a monitor to measure her BP. Pt has been more weak. She states she has right leg weakness at times. She states it has been on going, this is not new. She sometimes have to hold on to the wall to walk. Dtr states pt has parkinsons. No acute changes to the right leg, weakness is intermittent and the same as it has been. She does have intermittent pain in that right leg also, dtr gives her 2 tylenol  and she doesn't mention it again. Dtr just wants a follow up to check on status of things and make sure she is doing ok. Pt has also been out of metoprolol  for 2 weeks. RN advised all the questions about the leg are to rule out any emergency. Dtr states not an emergency, not new has been ongoing for a long time. RN offered appt for 12/31 but dtr states that it is too soon, they need 72 hours notice for transportation. Advised next available it 08/18/24, dtr would like to get her seen sooner than  that but only wants her to see Dr. Bennett.      1. SYMPTOM: What is the main symptom you are concerned about? (e.g., weakness, numbness)     Weakness, pain  2. ONSET: When did this start? (e.g., minutes, hours, days; while sleeping)     ongoing 3. LAST NORMAL: When was the last time you (the patient) were normal (no symptoms)?     States this is normal for her 4. PATTERN Does this come and go, or has it been constant since it started?  Is it present now?     intermittent 6. NEUROLOGIC SYMPTOMS: Have you had any of the following symptoms: headache, dizziness, vision loss, double vision, changes in speech, unsteady on your feet?     Unsteady on feet 7. OTHER SYMPTOMS: Do you have any other symptoms?     denies  Protocols used: Neurologic Deficit-A-AH

## 2024-07-18 ENCOUNTER — Other Ambulatory Visit: Payer: Self-pay

## 2024-07-18 NOTE — Telephone Encounter (Signed)
 Left detailed message on VM per DPR with Dr Charlyn message.

## 2024-07-18 NOTE — Patient Outreach (Signed)
 Social Drivers of Health  Community Resource and Care Coordination Visit Note   07/18/2024  Name: Shelby Jordan MRN: [id number] DOB:[birth year]  Situation: Referral received for [facility] needs assessment and assistance related to utilities. I obtained verbal consent from Patient.  Visit completed with Patient on the phone.   Background:      Assessment:   Goals Addressed             This Visit's Progress    BSW VBCI Social Work Care Plan       Problems:   Food Insecurity  and incontinence supplies and blood pressure cuff  CSW Clinical Goal(s):   Over the next 30 days the Patient will work with Child Psychotherapist to address concerns related to food, incontinence supplies and blood pressure cuff.  Interventions:  SW will send PCP a message for a script for incontinence supplies and blood pressure cuff. SW will send resources for supplies if Medicaid does not cover.SW mailed resources for [employer]. SW mailed resources for ensures and assisted living facilities  Patient Goals/Self-Care Activities:  Patient and daughter will [employer] send and apply for foodstamps Patient did receieve a food bag and supplies from [employer] [facility].  Plan:   SW will follow up on 07/31/24 at 12pm         Recommendation:   Contact resources for utilities.  Follow Up Plan:   Telephone follow-up 07/31/24 at 12pm  Thersia Hoar, BSW, [facility] [place] [place]  [facility] Social Worker, Population Health [phone]

## 2024-07-18 NOTE — Patient Instructions (Signed)
 Visit Information  Thank you for taking time to visit with me today. Please don't hesitate to contact me if I can be of assistance to you before our next scheduled appointment.  Your next care management appointment is by telephone on 07/31/24 at 12pm    Please call the care guide team at [phone] if you need to cancel, schedule, or reschedule an appointment.   Please call the Suicide and Crisis Lifeline: 988 call the [place]  National Suicide Prevention Lifeline: 484-217-0013 or TTY: (220)421-8252 TTY 802-585-7228) to talk to a trained counselor call 1-800-273-TALK (toll free, 24 hour hotline) go to [facility] [address], [place] [phone]) call 911 if you are experiencing a Mental Health or Behavioral Health Crisis or need someone to talk to.  Thersia Hoar, HEDWIG, MHA [place]  [facility] Social Worker, Population Health [phone]

## 2024-07-19 ENCOUNTER — Other Ambulatory Visit: Payer: Self-pay | Admitting: *Deleted

## 2024-07-19 MED ORDER — METOPROLOL SUCCINATE ER 50 MG PO TB24: 50.0000 mg | ORAL_TABLET | Freq: Every day | ORAL | 1 refills | Status: AC

## 2024-07-19 NOTE — Patient Instructions (Signed)
 Visit Information  Thank you for taking time to visit with me today. Please don't hesitate to contact me if I can be of assistance to you before our next scheduled appointment.  Your next care management appointment is by telephone  with BCSW on 07/31/24 at 12pm  Please call the care guide team at [phone] if you need to cancel, schedule, or reschedule an appointment.   Please call the Suicide and Crisis Lifeline: 988 call the [place]  National Suicide Prevention Lifeline: (830) 334-3738 or TTY: 5714672521 TTY 929-850-5428) to talk to a trained counselor call 1-800-273-TALK (toll free, 24 hour hotline) go to [facility] [address] [place] [phone]) call 911 if you are experiencing a Mental Health or Behavioral Health Crisis or need someone to talk to.  Andriana Casa, LCSW [place]  [facility], [facility] Licensed Clinical Social Worker  Direct Dial: [phone]

## 2024-07-19 NOTE — Patient Outreach (Signed)
 Complex Care Management   Visit Note  07/19/2024  Name:  Shelby Jordan MRN: [id number] DOB: 1951/03/31  Situation: Referral received for Complex Care Management related to Mental/Behavioral Health diagnosis bipolar disorder I obtained verbal consent from Caregiver.  Visit completed with caregiver  on the phone  Background:   Past Medical History:  Diagnosis Date   Bipolar 1 disorder (HCC)    Depression    Hepatitis C    High cholesterol    Hypertension    Seizures (HCC)    UTI (urinary tract infection)     Assessment: Patient's daughter confirms that patient is active with [facility]. Has in home personal care services 3 hours per day , 7 days per week. Currently working with BSW on placement and obtaining DME Patient Reported Symptoms:  Cognitive Cognitive Status: Struggling with memory recall, Normal speech and language skills Cognitive/Intellectual Conditions Management [RPT]: None reported or documented in medical history or problem list   Health Maintenance Behaviors: Annual physical exam Healing Pattern: Slow Health Facilitated by: Rest  Neurological Neurological Review of Symptoms: No symptoms reported    HEENT HEENT Symptoms Reported: No symptoms reported      Cardiovascular Cardiovascular Symptoms Reported: No symptoms reported    Respiratory Respiratory Symptoms Reported: No symptoms reported    Endocrine Endocrine Symptoms Reported: No symptoms reported Is patient diabetic?: No    Gastrointestinal Gastrointestinal Symptoms Reported: No symptoms reported      Genitourinary Genitourinary Symptoms Reported: Incontinence Additional Genitourinary Details: getting incontinence supplies through [employer]    Integumentary Integumentary Symptoms Reported: No symptoms reported    Musculoskeletal Musculoskelatal Symptoms Reviewed: No symptoms reported Additional Musculoskeletal Details: reports no pain today        Psychosocial Psychosocial  Symptoms Reported: Depression - if selected complete PHQ 2-9 Other Psychosocial Conditions: patient active with [facility] for mental health follow up-daughter looking for long term placement, has list of facilities and is agreeable to scheduling tours Behavioral Management Strategies: Medication therapy Behavioral Health Comment: Followed by [facility] Major Change/Loss/Stressor/Fears (CP): Medical condition, self Behaviors When Feeling Stressed/Fearful: withdraws Techniques to Cope with Loss/Stress/Change: Medication, Withdraw Quality of Family Relationships: supportive    07/19/2024    PHQ2-9 Depression Screening   Little interest or pleasure in doing things    Feeling down, depressed, or hopeless    PHQ-2 - Total Score    Trouble falling or staying asleep, or sleeping too much    Feeling tired or having little energy    Poor appetite or overeating     Feeling bad about yourself - or that you are a failure or have let yourself or your family down    Trouble concentrating on things, such as reading the newspaper or watching television    Moving or speaking so slowly that other people could have noticed.  Or the opposite - being so fidgety or restless that you have been moving around a lot more than usual    Thoughts that you would be better off dead, or hurting yourself in some way    PHQ2-9 Total Score    If you checked off any problems, how difficult have these problems made it for you to do your work, take care of things at home, or get along with other people    Depression Interventions/Treatment      There were no vitals filed for this visit. Pain Scale: 0-10 Pain Score: 0-No pain  Medications Reviewed Today     Reviewed by Jerald Villalona  CHRISTELLA HUGHS (Child Psychotherapist) on 07/19/24 at 1105  Med List Status: <None>   Medication Order Taking? Sig Documenting Provider Last Dose Status Informant  albuterol  (VENTOLIN  HFA) 108 (90 Base) MCG/ACT inhaler [id number] Yes Inhale 2  puffs into the lungs every 4 (four) hours as needed for shortness of breath. Bowa, Nahosi K, MD  Active   amLODipine  (NORVASC ) 5 MG tablet [id number] Yes TAKE 1 TABLET (5 MG TOTAL) BY MOUTH DAILY. Corwin Antu, FNP  Active Child, Pharmacy Records  ammonium lactate  (AMLACTIN DAILY) 12 % lotion [id number] Yes Apply 1 Application topically as needed. Tobie Franky SQUIBB, DPM  Active Child, Pharmacy Records  aspirin  81 MG chewable tablet [id number] Yes Chew 1 tablet by mouth daily. [provider]  Active   Calcium  Carb-Cholecalciferol  (CALCIUM  + VITAMIN D3) 600-10 MG-MCG TABS [id number] Yes Take 2 tablets by mouth daily. Bowa, Nahosi K, MD  Active   diazePAM , 20 MG Dose, 2 x 10 MG/0.1ML LQPK [id number] Yes Place 20 mg into the nose as needed (seizure lasting more than 2 mins). Yadav, Priyanka O, MD  Active   divalproex  (DEPAKOTE ) 500 MG DR tablet [id number] Yes Take 1 tablet (500 mg total) by mouth every 12 (twelve) hours. Elgergawy, Brayton RAMAN, MD  Active   fluticasone -salmeterol (WIXELA INHUB ) 100-50 MCG/ACT AEPB [id number] Yes Inhale 1 puff into the lungs 2 (two) times daily. Raenelle Donalda CHRISTELLA, MD  Active Child, Pharmacy Records  Incontinence Supply Disposable (DEPEND ADJUSTABLE UNDERWEAR LG) MISC [id number] Yes 1 each by Does not apply route daily. Bowa, Nahosi K, MD  Active   metoprolol  succinate (TOPROL -XL) 50 MG 24 hr tablet [id number] Yes Take 1 tablet (50 mg total) by mouth daily. Bowa, Nahosi K, MD  Active   mirtazapine  (REMERON ) 30 MG tablet [id number] Yes Take 30 mg by mouth at bedtime. Daughter states patient psychiatrist advised patient to take 15 mg per day.  Daughter states she will call psychiatry office to clarify dosage. [provider]  Active   Multiple Vitamins-Minerals (MULTIVITAMIN WOMEN 50+) TABS [id number] Yes Take 1 tablet by mouth daily. Centrum Silver [provider]  Active Child, Pharmacy Records  sertraline (ZOLOFT) 50 MG tablet [id number] Yes Take 50 mg by mouth  daily. [provider]  Active   traZODone  (DESYREL ) 50 MG tablet [id number] Yes Take 50 mg by mouth at bedtime. [provider]  Active             Recommendation:   PCP Follow-up Continue Current Plan of Care  Follow Up Plan:   Telephone follow up appointment date/time:  BSW on  07/31/24 12pm   Stefan Karen, LCSW [place]  [facility], [facility] Licensed Clinical Social Worker  Direct Dial: [phone]

## 2024-07-24 ENCOUNTER — Telehealth: Payer: Self-pay

## 2024-07-24 NOTE — Patient Instructions (Signed)
 Verdis Twyford - I am sorry I was unable to reach you today for our scheduled appointment. I work with Bennett Reuben POUR, MD and am calling to support your healthcare needs. Please contact me at [phone] at your earliest convenience. I look forward to speaking with you soon.   Thank you,  Nestora Duos, MSN, RN [facility]  [facility], [facility] RN Care Manager Direct Dial: [phone] Fax: [phone]

## 2024-07-31 ENCOUNTER — Telehealth: Payer: Self-pay

## 2024-07-31 NOTE — Patient Instructions (Signed)
 Ranyia Stooksbury - I am sorry I was unable to reach you today for our scheduled appointment. I work with Bennett Reuben POUR, MD and am calling to support your healthcare needs. Please contact me at [phone] at your earliest convenience. I look forward to speaking with you soon.   Thank you, Thersia Hoar, BSW, MHA [place]  [facility] Social Worker, Population Health [phone]

## 2024-08-02 ENCOUNTER — Telehealth: Payer: Self-pay

## 2024-08-02 MED ORDER — BLOOD PRESSURE MONITOR AUTOMAT DEVI: 1.0000 | Freq: Once | 0 refills | Status: AC

## 2024-08-02 NOTE — Addendum Note (Signed)
 Addended by: KALLIE CLOTILDA SQUIBB on: 08/02/2024 04:57 PM   Modules accepted: Orders

## 2024-08-02 NOTE — Telephone Encounter (Addendum)
 Left a message on VM for pt's daughter advising that I was not sure that her insurance would cover another BP Monitor before 1 year since Dr Bennett did a rx for 1. Previous conversation 04-18-24 were that she did not know how to use the one she had. I had requested she bring it with her to the next appt, but had forgotten to bring it. The message cut off while I leaving the message.

## 2024-08-02 NOTE — Telephone Encounter (Signed)
 BP monitor rx sent to [facility].

## 2024-08-02 NOTE — Telephone Encounter (Signed)
 Copied from CRM [id number]. Topic: Clinical - Order For Equipment >> Aug 02, 2024  3:37 PM Donna BRAVO wrote: Reason for CRM: Dama calling asking for an prescription for home BP monitor. Dama has been asking for this starting in August and wants this done ASAP   Sent prescription to  [facility]  [address]  [place], [place] [address] [phone]

## 2024-08-02 NOTE — Telephone Encounter (Addendum)
 Spoke to daughter. She said she never got the BP Cuff through insurance. She bought it out of pocket and states she took it back. [place] she has been trying to get a meter through [facility] since August. That was not mentioned when Dr Bennett did the rx 04-13-24. She was trying to tell me something and her phone cut off, again.

## 2024-08-03 ENCOUNTER — Telehealth: Payer: Self-pay

## 2024-08-03 NOTE — Telephone Encounter (Signed)
 Copied from CRM [id number]. Topic: General - Call Back - No Documentation >> Aug 02, 2024  4:27 PM Avram MATSU wrote: Reason for CRM: patient stated shannon informed her a BP machine was sent in to a pharmacy([facility]). Patient stated she never got the machine and would like to know where it was sent. Please advise, [phone]

## 2024-08-03 NOTE — Telephone Encounter (Signed)
 See other message already open about this

## 2024-08-08 NOTE — Patient Outreach (Signed)
 RNCM spoke with Daughter - scheduled for follow up and provided education on use of BP cuff - will bring to next PCP appointment as advised.

## 2024-08-10 ENCOUNTER — Ambulatory Visit: Admitting: Podiatry

## 2024-08-10 DIAGNOSIS — Z91199 Patient's noncompliance with other medical treatment and regimen due to unspecified reason: Secondary | ICD-10-CM

## 2024-08-10 NOTE — Progress Notes (Signed)
 1. No-show for appointment

## 2024-08-17 ENCOUNTER — Other Ambulatory Visit: Payer: Self-pay

## 2024-08-17 NOTE — Patient Instructions (Signed)
 Visit Information  Thank you for taking time to visit with me today. Please don't hesitate to contact me if I can be of assistance to you before our next scheduled appointment.  Your next care management appointment is by telephone on 08/29/24 at 1pm    Please call the care guide team at [phone] if you need to cancel, schedule, or reschedule an appointment.   Please call the Suicide and Crisis Lifeline: 988 call the [place]  National Suicide Prevention Lifeline: (725)113-2357 or TTY: 705-579-9708 TTY (772)389-8012) to talk to a trained counselor call 1-800-273-TALK (toll free, 24 hour hotline) call 911 if you are experiencing a Mental Health or Behavioral Health Crisis or need someone to talk to.  Thersia Hoar, HEDWIG, MHA [facility]  [facility] Social Worker, Population Health [phone]

## 2024-08-17 NOTE — Patient Outreach (Signed)
 Social Drivers of Health  Community Resource and Care Coordination Visit Note   08/17/2024  Name: Shelby Jordan MRN: [id number] DOB:December 24, 1950  Situation: Referral received for [facility] needs assessment and assistance related to utilties. I obtained verbal consent from Patient.  Visit completed with Patient on the phone.   Background:      Assessment:   Goals Addressed             This Visit's Progress    BSW VBC[facility] Social Work Care Plan       Problems:   Food Insecurity  and incontinence supplies and blood pressure cuff  CSW Clinical Goal(s):   Over the next 30 days the Patient will work with Child Psychotherapist to address concerns related to food, incontinence supplies and blood pressure cuff.  Interventions:  SW will send PCP a message for a script for incontinence supplies and blood pressure cuff. SW will send resources for supplies if Medicaid does not cover.SW mailed resources for [employer]. SW mailed resources for ensures and assisted living facilities. SW resent resources for utilities via email.  Patient Goals/Self-Care Activities:  Patient and daughter will [employer] send and apply for foodstamps Patient did receieve a food bag and supplies from [employer].  Plan:   SW will follow up on 08/29/24 at 1:00pm         Recommendation:   Use resources sent to assistance with utilities.  Follow Up Plan:   Telephone follow-up 08/29/24 at 1:00pm  Thersia Hoar, BSW, [facility]  [facility] Social Worker, Population Health [phone]

## 2024-08-21 ENCOUNTER — Other Ambulatory Visit: Payer: Self-pay

## 2024-08-21 DIAGNOSIS — N1832 Chronic kidney disease, stage 3b: Secondary | ICD-10-CM

## 2024-08-21 NOTE — Progress Notes (Signed)
 Labs ordered.

## 2024-08-22 NOTE — Addendum Note (Signed)
 Addended by: Neri Vieyra E on: 08/22/2024 11:32 AM   Modules accepted: Orders

## 2024-08-23 ENCOUNTER — Other Ambulatory Visit

## 2024-08-24 ENCOUNTER — Telehealth

## 2024-08-24 ENCOUNTER — Other Ambulatory Visit

## 2024-08-24 DIAGNOSIS — N1832 Chronic kidney disease, stage 3b: Secondary | ICD-10-CM

## 2024-08-24 LAB — CBC WITH DIFFERENTIAL/PLATELET
Basophils Absolute: 0 10*3/uL (ref 0.0–0.1)
Basophils Relative: 0.4 % (ref 0.0–3.0)
Eosinophils Absolute: 0.2 10*3/uL (ref 0.0–0.7)
Eosinophils Relative: 3.1 % (ref 0.0–5.0)
HCT: 38.2 % (ref 36.0–46.0)
Hemoglobin: 12.9 g/dL (ref 12.0–15.0)
Lymphocytes Relative: 36.1 % (ref 12.0–46.0)
Lymphs Abs: 2 10*3/uL (ref 0.7–4.0)
MCHC: 33.7 g/dL (ref 30.0–36.0)
MCV: 91.3 fl (ref 78.0–100.0)
Monocytes Absolute: 0.5 10*3/uL (ref 0.1–1.0)
Monocytes Relative: 8.7 % (ref 3.0–12.0)
Neutro Abs: 2.9 10*3/uL (ref 1.4–7.7)
Neutrophils Relative %: 51.7 % (ref 43.0–77.0)
Platelets: 245 10*3/uL (ref 150.0–400.0)
RBC: 4.18 Mil/uL (ref 3.87–5.11)
RDW: 15.7 % — ABNORMAL HIGH (ref 11.5–15.5)
WBC: 5.5 10*3/uL (ref 4.0–10.5)

## 2024-08-29 ENCOUNTER — Ambulatory Visit: Payer: 59

## 2024-08-29 ENCOUNTER — Telehealth

## 2024-08-30 ENCOUNTER — Encounter

## 2024-09-06 ENCOUNTER — Ambulatory Visit

## 2024-09-14 ENCOUNTER — Telehealth

## 2025-05-30 ENCOUNTER — Ambulatory Visit: Admitting: Neurology
